# Patient Record
Sex: Female | Born: 1960 | Race: White | Hispanic: No | Smoking: Current every day smoker
Health system: Southern US, Community
[De-identification: ages and names within clinical notes are randomized; demographics above are authoritative.]

## PROBLEM LIST (undated history)

## (undated) DIAGNOSIS — I773 Arterial fibromuscular dysplasia: Secondary | ICD-10-CM

## (undated) DIAGNOSIS — J439 Emphysema, unspecified: Secondary | ICD-10-CM

## (undated) DIAGNOSIS — F32A Depression, unspecified: Secondary | ICD-10-CM

## (undated) DIAGNOSIS — K759 Inflammatory liver disease, unspecified: Secondary | ICD-10-CM

## (undated) DIAGNOSIS — F419 Anxiety disorder, unspecified: Secondary | ICD-10-CM

## (undated) DIAGNOSIS — K219 Gastro-esophageal reflux disease without esophagitis: Secondary | ICD-10-CM

## (undated) DIAGNOSIS — F5081 Binge eating disorder: Secondary | ICD-10-CM

## (undated) DIAGNOSIS — E559 Vitamin D deficiency, unspecified: Secondary | ICD-10-CM

## (undated) DIAGNOSIS — T7840XA Allergy, unspecified, initial encounter: Secondary | ICD-10-CM

## (undated) DIAGNOSIS — F50819 Binge eating disorder, unspecified: Secondary | ICD-10-CM

## (undated) DIAGNOSIS — R519 Headache, unspecified: Secondary | ICD-10-CM

## (undated) DIAGNOSIS — M5020 Other cervical disc displacement, unspecified cervical region: Secondary | ICD-10-CM

## (undated) DIAGNOSIS — H269 Unspecified cataract: Secondary | ICD-10-CM

## (undated) DIAGNOSIS — G5601 Carpal tunnel syndrome, right upper limb: Secondary | ICD-10-CM

## (undated) DIAGNOSIS — B977 Papillomavirus as the cause of diseases classified elsewhere: Secondary | ICD-10-CM

## (undated) DIAGNOSIS — J449 Chronic obstructive pulmonary disease, unspecified: Secondary | ICD-10-CM

## (undated) DIAGNOSIS — M4802 Spinal stenosis, cervical region: Secondary | ICD-10-CM

## (undated) DIAGNOSIS — G4730 Sleep apnea, unspecified: Secondary | ICD-10-CM

## (undated) DIAGNOSIS — M1990 Unspecified osteoarthritis, unspecified site: Secondary | ICD-10-CM

## (undated) DIAGNOSIS — G629 Polyneuropathy, unspecified: Secondary | ICD-10-CM

## (undated) DIAGNOSIS — R87629 Unspecified abnormal cytological findings in specimens from vagina: Secondary | ICD-10-CM

## (undated) DIAGNOSIS — F329 Major depressive disorder, single episode, unspecified: Secondary | ICD-10-CM

## (undated) DIAGNOSIS — I10 Essential (primary) hypertension: Secondary | ICD-10-CM

## (undated) HISTORY — DX: Unspecified abnormal cytological findings in specimens from vagina: R87.629

## (undated) HISTORY — PX: EYE SURGERY: SHX253

## (undated) HISTORY — DX: Binge eating disorder: F50.81

## (undated) HISTORY — DX: Sleep apnea, unspecified: G47.30

## (undated) HISTORY — DX: Essential (primary) hypertension: I10

## (undated) HISTORY — DX: Unspecified cataract: H26.9

## (undated) HISTORY — PX: BREAST BIOPSY: SHX20

## (undated) HISTORY — DX: Vitamin D deficiency, unspecified: E55.9

## (undated) HISTORY — DX: Major depressive disorder, single episode, unspecified: F32.9

## (undated) HISTORY — DX: Depression, unspecified: F32.A

## (undated) HISTORY — DX: Allergy, unspecified, initial encounter: T78.40XA

## (undated) HISTORY — DX: Polyneuropathy, unspecified: G62.9

## (undated) HISTORY — DX: Binge eating disorder, unspecified: F50.819

## (undated) HISTORY — DX: Emphysema, unspecified: J43.9

## (undated) HISTORY — PX: CHOLECYSTECTOMY: SHX55

## (undated) HISTORY — DX: Gastro-esophageal reflux disease without esophagitis: K21.9

## (undated) HISTORY — DX: Papillomavirus as the cause of diseases classified elsewhere: B97.7

---

## 1982-06-05 HISTORY — PX: TUBAL LIGATION: SHX77

## 1991-06-06 HISTORY — PX: APPENDECTOMY: SHX54

## 1999-02-28 ENCOUNTER — Inpatient Hospital Stay (HOSPITAL_COMMUNITY): Admission: AD | Admit: 1999-02-28 | Discharge: 1999-03-04 | Payer: Self-pay | Admitting: Specialist

## 2004-06-05 DIAGNOSIS — I773 Arterial fibromuscular dysplasia: Secondary | ICD-10-CM

## 2004-06-05 HISTORY — DX: Arterial fibromuscular dysplasia: I77.3

## 2004-06-05 HISTORY — PX: RENAL ARTERY ANGIOPLASTY: SHX2317

## 2004-09-15 ENCOUNTER — Ambulatory Visit: Payer: Self-pay | Admitting: Cardiology

## 2004-09-16 ENCOUNTER — Ambulatory Visit: Payer: Self-pay | Admitting: Cardiology

## 2004-09-16 ENCOUNTER — Ambulatory Visit (HOSPITAL_COMMUNITY): Admission: RE | Admit: 2004-09-16 | Discharge: 2004-09-17 | Payer: Self-pay | Admitting: Cardiology

## 2004-10-03 ENCOUNTER — Ambulatory Visit: Payer: Self-pay | Admitting: Cardiology

## 2007-10-22 ENCOUNTER — Ambulatory Visit (HOSPITAL_COMMUNITY): Admission: RE | Admit: 2007-10-22 | Discharge: 2007-10-22 | Payer: Self-pay | Admitting: Family Medicine

## 2008-10-23 ENCOUNTER — Ambulatory Visit (HOSPITAL_COMMUNITY): Admission: RE | Admit: 2008-10-23 | Discharge: 2008-10-23 | Payer: Self-pay | Admitting: Family Medicine

## 2010-06-23 ENCOUNTER — Encounter: Payer: Self-pay | Admitting: Gastroenterology

## 2010-07-07 NOTE — Letter (Signed)
Summary: Pre Visit Letter Revised  [facility]  [address] [place], [place] [address]   Phone: [phone]  Fax: [phone]        06/23/2010 MRN: [id number]  Jamie Mcgee [address] [place], [place]  [address]             Procedure Date:  07-29-10 11am            Dr Arlyce Dice  - Direct Colon   Welcome to the Gastroenterology Division at [facility].    You are scheduled to see a nurse for your pre-procedure visit on 07-15-10 at 3:30pm on the 3rd floor at [facility], [address]  We ask that you try to arrive at our office 15 minutes prior to your appointment time to allow for check-in.  Please take a minute to review the attached form.  If you answer "Yes" to one or more of the questions on the first page, we ask that you call the person listed at your earliest opportunity.  If you answer "No" to all of the questions, please complete the rest of the form and bring it to your appointment.    Your nurse visit will consist of discussing your medical and surgical history, your immediate family medical history, and your medications.   If you are unable to list all of your medications on the form, please bring the medication bottles to your appointment and we will list them.  We will need to be aware of both prescribed and over the counter drugs.  We will need to know exact dosage information as well.    Please be prepared to read and sign documents such as consent forms, a financial agreement, and acknowledgement forms.  If necessary, and with your consent, a friend or relative is welcome to sit-in on the nurse visit with you.  Please bring your insurance card so that we may make a copy of it.  If your insurance requires a referral to see a specialist, please bring your referral form from your primary care physician.  No co-pay is required for this nurse visit.     If you cannot keep your appointment, please call [phone] to cancel or reschedule prior to  your appointment date.  This allows [place] the opportunity to schedule an appointment for another patient in need of care.    Thank you for choosing [facility] for your medical needs.  We appreciate the opportunity to care for you.  Please visit [place] at our website  to learn more about our practice.  Sincerely, The Gastroenterology Division

## 2010-07-14 ENCOUNTER — Encounter (INDEPENDENT_AMBULATORY_CARE_PROVIDER_SITE_OTHER): Payer: Self-pay | Admitting: *Deleted

## 2010-07-15 ENCOUNTER — Encounter: Payer: Self-pay | Admitting: Gastroenterology

## 2010-07-15 DIAGNOSIS — R9431 Abnormal electrocardiogram [ECG] [EKG]: Secondary | ICD-10-CM

## 2010-07-21 NOTE — Letter (Signed)
Summary: [facility] Instructions  [facility]  [address] [place], [place] [address]   Phone: [phone]  Fax: [phone]       AMBERA FEDELE    02/22/1961    MRN: [id number]        Procedure Day /Date: Friday 07/29/10     Arrival Time: 10:00 am      Procedure Time: 11:00 am     Location of Procedure:                    _X_  [facility] (4th Floor)  PREPARATION FOR COLONOSCOPY WITH MOVIPREP   Starting 5 days prior to your procedure 07/24/10 do not eat nuts, seeds, popcorn, corn, beans, peas,  salads, or any raw vegetables.  Do not take any fiber supplements (e.g. Metamucil, Citrucel, and Benefiber).  THE DAY BEFORE YOUR PROCEDURE         DATE: 07/28/10   DAY: Thursday    1.  Drink clear liquids the entire day-NO SOLID FOOD  2.  Do not drink anything colored red or purple.  Avoid juices with pulp.  No orange juice.  3.  Drink at least 64 oz. (8 glasses) of fluid/clear liquids during the day to prevent dehydration and help the prep work efficiently.  CLEAR LIQUIDS INCLUDE: Water Jello Ice Popsicles Tea (sugar ok, no milk/cream) Powdered fruit flavored drinks Coffee (sugar ok, no milk/cream) Gatorade Juice: apple, white grape, white cranberry  Lemonade Clear bullion, consomm, broth Carbonated beverages (any kind) Strained chicken noodle soup Hard Candy                             4.  In the morning, mix first dose of MoviPrep solution:    Empty 1 Pouch A and 1 Pouch B into the disposable container    Add lukewarm drinking water to the top line of the container. Mix to dissolve    Refrigerate (mixed solution should be used within 24 hrs)  5.  Begin drinking the prep at 5:00 p.m. The MoviPrep container is divided by 4 marks.   Every 15 minutes drink the solution down to the next mark (approximately 8 oz) until the full liter is complete.   6.  Follow completed prep with 16 oz of clear liquid of your choice (Nothing red or purple).   Continue to drink clear liquids until bedtime.  7.  Before going to bed, mix second dose of MoviPrep solution:    Empty 1 Pouch A and 1 Pouch B into the disposable container    Add lukewarm drinking water to the top line of the container. Mix to dissolve    Refrigerate  THE DAY OF YOUR PROCEDURE      DATE: 07/29/10  DAY: Friday   Beginning at 6:00 a.m. (5 hours before procedure):         1. Every 15 minutes, drink the solution down to the next mark (approx 8 oz) until the full liter is complete.  2. Follow completed prep with 16 oz. of clear liquid of your choice.    3. You may drink clear liquids until 9:00 am   (2 HOURS BEFORE PROCEDURE).   MEDICATION INSTRUCTIONS  Unless otherwise instructed, you should take regular prescription medications with a small sip of water   as early as possible the morning of your procedure.         OTHER INSTRUCTIONS  You will  need a responsible adult at least 51 years of age to accompany you and drive you home.   This person must remain in the waiting room during your procedure.  Wear loose fitting clothing that is easily removed.  Leave jewelry and other valuables at home.  However, you may wish to bring a book to read or  an iPod/MP3 player to listen to music as you wait for your procedure to start.  Remove all body piercing jewelry and leave at home.  Total time from sign-in until discharge is approximately 2-3 hours.  You should go home directly after your procedure and rest.  You can resume normal activities the  day after your procedure.  The day of your procedure you should not:   Drive   Make legal decisions   Operate machinery   Drink alcohol   Return to work  You will receive specific instructions about eating, activities and medications before you leave.    The above instructions have been reviewed and explained to me by   Sherren Kerns RN  July 15, 2010 3:50 PM    I fully understand and can verbalize  these instructions _____________________________ Date _________

## 2010-07-21 NOTE — Miscellaneous (Signed)
Summary: previsit prep/rm  Clinical Lists Changes  Medications: Added new medication of MOVIPREP 100 GM  SOLR (PEG-KCL-NACL-NASULF-NA ASC-C) As per prep instructions. - Signed Rx of MOVIPREP 100 GM  SOLR (PEG-KCL-NACL-NASULF-NA ASC-C) As per prep instructions.;  #1 x 0;  Signed;  Entered by: Sherren Kerns RN;  Authorized by: Louis Meckel MD;  Method used: Electronically to [facility] [address] [place], [place], [place]  [address], Ph: [phone], Fax: [phone] Observations: Added new observation of ALLERGY REV: Done (07/15/2010 14:59) Added new observation of NKA: T (07/15/2010 14:59)    Prescriptions: MOVIPREP 100 GM  SOLR (PEG-KCL-NACL-NASULF-NA ASC-C) As per prep instructions.  #1 x 0   Entered by:   Sherren Kerns RN   Authorized by:   Louis Meckel MD   Signed by:   Sherren Kerns RN on 07/15/2010   Method used:   Electronically to        [facility] (retail)       [address]       [place], [place]  [address]       Ph: [phone]       Fax: [phone]   RxID:   [id number]

## 2010-07-22 ENCOUNTER — Other Ambulatory Visit: Payer: Self-pay | Admitting: Ophthalmology

## 2010-07-22 ENCOUNTER — Encounter (HOSPITAL_COMMUNITY): Payer: BC Managed Care – PPO

## 2010-07-22 DIAGNOSIS — Z01812 Encounter for preprocedural laboratory examination: Secondary | ICD-10-CM | POA: Insufficient documentation

## 2010-07-22 LAB — BASIC METABOLIC PANEL
CO2: 25 mEq/L (ref 19–32)
Calcium: 9.3 mg/dL (ref 8.4–10.5)

## 2010-07-22 LAB — HEMOGLOBIN AND HEMATOCRIT, BLOOD
HCT: 43.2 % (ref 36.0–46.0)
Hemoglobin: 14.8 g/dL (ref 12.0–15.0)

## 2010-07-29 ENCOUNTER — Other Ambulatory Visit (AMBULATORY_SURGERY_CENTER): Payer: BC Managed Care – PPO | Admitting: Gastroenterology

## 2010-07-29 ENCOUNTER — Other Ambulatory Visit: Payer: Self-pay | Admitting: Gastroenterology

## 2010-07-29 DIAGNOSIS — D126 Benign neoplasm of colon, unspecified: Secondary | ICD-10-CM

## 2010-07-29 DIAGNOSIS — K6389 Other specified diseases of intestine: Secondary | ICD-10-CM

## 2010-07-29 DIAGNOSIS — Z1211 Encounter for screening for malignant neoplasm of colon: Secondary | ICD-10-CM

## 2010-08-02 ENCOUNTER — Encounter: Payer: Self-pay | Admitting: Gastroenterology

## 2010-08-02 NOTE — Procedures (Addendum)
Summary: Colonoscopy  Patient: Jamie Mcgee Note: All result statuses are Final unless otherwise noted.  Tests: (1) Colonoscopy (COL)   COL Colonoscopy           DONE     [facility]     [address]     [place], [place]  [address]           COLONOSCOPY PROCEDURE REPORT           PATIENT:  Jamie Mcgee, Jamie Mcgee  MR#:  [id number]     BIRTHDATE:  01/25/1961, 49 yrs. old  GENDER:  female           ENDOSCOPIST:  Barbette Hair. Arlyce Dice, MD     Referred by:  Rudi Heap, M.D.           PROCEDURE DATE:  07/29/2010     PROCEDURE:  Colon with cold biopsy polypectomy     ASA CLASS:  Class II     INDICATIONS:  1) Routine Risk Screening           MEDICATIONS:   Fentanyl 75 mcg IV, Versed 7 mg IM, Benadryl 25 mg     IV           DESCRIPTION OF PROCEDURE:   After the risks benefits and     alternatives of the procedure were thoroughly explained, informed     consent was obtained.  Digital rectal exam was performed and     revealed no abnormalities.   The LB 180AL [id number] endoscope was     introduced through the anus and advanced to the cecum, which was     identified by both the appendix and ileocecal valve, without     limitations.  The quality of the prep was excellent, using     MoviPrep.  The instrument was then slowly withdrawn as the colon     was fully examined.     <<PROCEDUREIMAGES>>           FINDINGS:  A diminutive polyp was found in the rectum. It was 2 mm     in size. The polyp was removed using cold biopsy forceps (see     image12).  Melanosis coli was found.  This was otherwise a normal     examination of the colon (see image1, image2, image3, image5,     image8, image10, and image11).   Retroflexed views in the rectum     revealed no abnormalities.    The time to cecum =  0  minutes. The     scope was then withdrawn (time =  6.75  min) from the patient and     the procedure completed.           COMPLICATIONS:  None           ENDOSCOPIC IMPRESSION:     1) 2 mm  diminutive polyp in the rectum     2) Melanosis     3) Otherwise normal examination     RECOMMENDATIONS:     1) If the polyp(s) removed today are proven to be adenomatous     (pre-cancerous) polyps, you will need a repeat colonoscopy in 5     years. Otherwise you should continue to follow colorectal cancer     screening guidelines for "routine risk" patients with colonoscopy     in 10 years.           REPEAT EXAM:   You will receive a letter from Dr. Arlyce Dice  in 1-2     weeks, after reviewing the final pathology, with followup     recommendations.           ______________________________     Barbette Hair Arlyce Dice, MD           CC:           n.     eSIGNED:   Barbette Hair. Kaplan at 07/29/2010 12:00 PM           Jamie Mcgee, Jamie Mcgee, [id number]  Note: An exclamation mark (!) indicates a result that was not dispersed into the flowsheet. Document Creation Date: 07/29/2010 12:03 PM _______________________________________________________________________  (1) Order result status: Final Collection or observation date-time: 07/29/2010 11:55 Requested date-time:  Receipt date-time:  Reported date-time:  Referring Physician:   Ordering Physician: Melvia Heaps [phone]) Specimen Source:  Source: Launa Grill Order Number: [id number] Lab site:   Appended Document: Colonoscopy     Procedures Next Due Date:    Colonoscopy: 07/2020

## 2010-08-04 ENCOUNTER — Ambulatory Visit (HOSPITAL_COMMUNITY)
Admission: RE | Admit: 2010-08-04 | Discharge: 2010-08-04 | Disposition: A | Discharge status: Home / Self Care | Payer: BC Managed Care – PPO | Source: Ambulatory Visit | Attending: Ophthalmology | Admitting: Ophthalmology

## 2010-08-04 DIAGNOSIS — Z01812 Encounter for preprocedural laboratory examination: Secondary | ICD-10-CM | POA: Insufficient documentation

## 2010-08-04 DIAGNOSIS — Z79899 Other long term (current) drug therapy: Secondary | ICD-10-CM | POA: Insufficient documentation

## 2010-08-04 DIAGNOSIS — I10 Essential (primary) hypertension: Secondary | ICD-10-CM | POA: Insufficient documentation

## 2010-08-04 DIAGNOSIS — H2510 Age-related nuclear cataract, unspecified eye: Secondary | ICD-10-CM | POA: Insufficient documentation

## 2010-08-04 DIAGNOSIS — Z01818 Encounter for other preprocedural examination: Secondary | ICD-10-CM | POA: Insufficient documentation

## 2010-08-04 HISTORY — PX: CATARACT EXTRACTION: SUR2

## 2010-08-11 NOTE — Letter (Signed)
Summary: Patient Notice-Hyperplastic Polyps  [facility]  [address] [place], [place] [address]   Phone: [phone]  Fax: [phone]        August 02, 2010 MRN: [id number]    [facility] [address] [place], [place]  [address]    Dear Ms. GOEL,  I am pleased to inform you that the colon polyp(s) removed during your recent colonoscopy was (were) found to be hyperplastic.  These types of polyps are NOT pre-cancerous.  It is therefore my recommendation that you have a repeat colonoscopy examination in 10_ years for routine colorectal cancer screening.  Should you develop new or worsening symptoms of abdominal pain, bowel habit changes or bleeding from the rectum or bowels, please schedule an evaluation with either your primary care physician or with me.  Additional information/recommendations:  __No further action with gastroenterology is needed at this time.      Please follow-up with your primary care physician for your other      healthcare needs. __Please call [phone] to schedule a return visit to review      your situation.  __Please keep your follow-up visit as already scheduled.  _x_Continue treatment plan as outlined the day of your exam.  Please call us if you are having persistent problems or have questions about your condition that have not been fully answered at this time.  Sincerely,  Louis Meckel MD This letter has been electronically signed by your physician.  Appended Document: Patient Notice-Hyperplastic Polyps letter mailed

## 2010-08-18 ENCOUNTER — Encounter: Payer: Self-pay | Admitting: Cardiology

## 2010-09-01 ENCOUNTER — Institutional Professional Consult (permissible substitution): Payer: BC Managed Care – PPO | Admitting: Cardiology

## 2010-10-21 NOTE — Discharge Summary (Signed)
Jamie Mcgee, Jamie Mcgee               ACCOUNT NO.:  [id number]   MEDICAL RECORD NO.:  [id number]          PATIENT TYPE:  OIB   LOCATION:  5733                         FACILITY:  [facility]   PHYSICIAN:  Salvadore Farber, M.D. 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  The region of fibromuscular  dysplasia was treated with balloon angioplasty with good angiographic  result.   PAST MEDICAL HISTORY:  1.  Hypertension with aggressive titration of multiple medications still      uncontrolled prior to this procedure.  2.  Tobacco abuse.  3.  Marijuana use.   FAMILY HISTORY:  CAD.   DISPOSITION:  The patient being discharged home with instructions to  continue a low fat/low salt/low cholesterol diet.  She may shower, no tub  bathing x1 week, no lifting over 10 pounds x1 week, no driving for two days.  Gently clean cath site with soap and water.  She will continue Lotrel 5/20  mg p.o. b.i.d., nicotine patch as previously, aspirin 325 mg daily.  She is  instructed to discontinue her clonidine.  I have called the office and left  messages for the patient to be contacted to have a BMET drawn this Wednesday  or Thursday.  She also will need a followup appointment with the PA on Dr.  Melinda Crutch office day and around 10 days.  She is instructed to call our  office for any fevers, any pain, swelling from cath site.      MB/MEDQ  D:  09/17/2004  T:  09/17/2004  Job:  [id number]

## 2010-10-21 NOTE — Discharge Summary (Signed)
[facility]  Patient:    Jamie Mcgee                      MRN: [id number] Adm. Date:  [id number] Disc. Date: 03/04/99 Attending:  Milagros Evener Mcgee                           Discharge Summary  HISTORY OF PRESENT ILLNESS:  Jamie Mcgee is a 50 year old married white female who as admitted for treatment of severe depression of several years.  Her depression became worse and she planned on overdosing on her medications.  She admitted to  having felt depressed for several years.  Her depression became worse more recently.  She admitted to feeling hopeless, helpless, and worthless.  Also reported decreased energy, decreased motivation, increased sleep, anhedonia, and multiple problems with her husband.  The patient does not have any sex drive and has not had one for almost a year. Her husband thinks she does not love him anymore.  He does not seem to understand her depression.  The patient did see her primary care physician about nine months ago and was prescribed Paxil which made matters worse.  Since the patient was not able to contract for safety, hence this admission.  ADMITTING DIAGNOSES:   AXIS I:  Major depression disorder, single episode, severe, without psychotic            features.  AXIS II:  Deferred. AXIS III:  Healthy.  AXIS IV:  Chronic depression and marital problems.   AXIS V:  Code 30/65.  HOSPITAL COURSE:  The patient was admitted to unit 5000.  She was put on close watch and suicide precautions.  Urinalysis, drug screen, CMET, and CBC were ordered and she was encouraged to take part in individual and group psychotherapy.  During her stay in the hospital, her Effexor was resumed.  Family session was ordered.  Effexor was tapered off since the patient did not improve and has been on it for some time before she came here.  Also her blood pressure had increased. I added Wellbutrin SR 150 mg p.o. q.a.m. and to be increased  to p.o. q.a.m. at noon. Checks were discontinued on March 04, 1999 since the patient felt better. he did not have thoughts of harming herself.  Trazodone was prescribed for insomnia.  Her WBC count and basic metabolic panel was within normal limits.  Her UA revealed normal wbc count, rbcs were 6 to 10.  Drug screen was positive for marijuana and benzodiazepines.  Thyroid functions were within normal limits.  Her vital signs  were monitored closely and were within normal limits.  Since the patient had improved, she was able to contract for safety.  She felt uch better.  She was discharged home and was given outpatient appointment.  DISCHARGE MEDICATIONS: 1. Wellbutrin SR 150 mg p.o. q.a.m. for 5 days and then p.o. q.a.m. at noon. 2. Trazodone 50 mg 1-2 tablets at bed time.  DISCHARGE INSTRUCTIONS:  She was given outpatient appointment for follow-up in October.  PROGNOSIS:  Good.  DISCHARGE DIAGNOSES:   AXIS I:  Major depressive disorder, single episode, severe without psychotic            features.  AXIS II:  Deferred. AXIS III:  Healthy.  AXIS IV:  Recurrent chronic depression and marital problems due to depression.   AXIS V:  Code 60 at the time of discharge.DD:  05/01/99 TD:  05/01/99 Job: [id number]

## 2010-10-21 NOTE — Op Note (Signed)
NAMEALETHA, ALLEBACH               ACCOUNT NO.:  [id number]   MEDICAL RECORD NO.:  [id number]          PATIENT TYPE:  OIB   LOCATION:  2855                         FACILITY:  [facility]   PHYSICIAN:  Salvadore Farber, M.D. LHCDATE OF BIRTH:  1961-01-28   DATE OF PROCEDURE:  09/16/2004  DATE OF DISCHARGE:                                 OPERATIVE REPORT   PROCEDURE:  Abdominal aortography, selective bilateral renal angiography,  balloon angioplasty of the right renal artery.   INDICATIONS FOR PROCEDURE:  [name] is a 50 year old lady who was  diagnosed with hypertension in February of this year.  At that time, blood  pressure was 240/130.  Despite aggressive up titration of multiple  medications, blood pressure has not been able to be controlled.  Evaluation  for secondary causes of hypertension has revealed only suggestion of right  renal artery stenosis by renal duplex performed at [facility].  She  is referred for angiography and possible percutaneous revascularization.   PROCEDURE TECHNIQUE:  Informed consent was obtained.  Under 1% lidocaine  local anesthesia, a 5 French sheath was placed in the right common femoral  artery using the modified Seldinger technique.  The pigtail catheter was  advanced to the suprarenal abdominal aorta.  Abdominal aortography was  performed by power injection.  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  The sheath was up sized to a 6  [place].  Anticoagulation was initiated with heparin to achieve an ACT of  greater than 250 seconds.  A 6 French LIMA guide was then used to  selectively engage the left renal artery.  This was angiographically normal.  The guide was then selectively engaged in the right renal artery.  Angiography was again performed by hand injection.  This confirmed  fibromuscular dysplasia in the mid portion of this vessel.  A stabilizer  wire was advanced beyond the region of  dysplasia without difficulty.  The  vessel was initially dilated using a 3.5 by 20 mm Maverick at 6 atmospheres.  We then further dilated using a 4 by 15 mm Aviator at 10 atmospheres.  This  balloon also appeared under sized.  The vessel was then further dilated  using a 5 by 15 mm Aviator at 8 atmospheres distally and a second inflation  at 6 atmospheres proximally. With these inflations, she did have mild  discomfort that resolved with balloon deflation.  Repeat angiography  demonstrated marked improvement in the angiographic appearance of the vessel  and normal flow to the distal vasculature.  No evidence of dissection.  The  patient tolerated the procedure well and was transferred to the holding room  in stable condition.  The sheaths will be removed there.   COMPLICATIONS:  None.   FINDINGS:  1.  Abdominal aorta:  No evidence of atherosclerosis in the abdominal aorta      or proximal portion of the common iliacs bilaterally.  2.  Right renal artery:  Single vessel, fibromuscular dysplasia of the      proximal and mid vessel treated with balloon angioplasty.  3.  Left renal artery is angiographically normal.  4.  Both kidneys are of normal size and have normal contrast uptake.   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  The single left renal artery was angiographically  normal.  The region of fibromuscular dysplasia was treated with balloon  angioplasty with good angiographic result.  Will plan on holding her  antihypertensive medications for at least the next 24 hours to assess  response.  Check renal function in the morning.      WED/MEDQ  D:  09/16/2004  T:  09/16/2004  Job:  [id number]   cc:   Cecille Aver, M.D.  [address]  [place]  [place] [address]  Fax: [phone]   Alfredia Client, M.D.

## 2011-05-22 ENCOUNTER — Encounter (HOSPITAL_COMMUNITY): Payer: Self-pay | Admitting: Pharmacy Technician

## 2011-05-25 ENCOUNTER — Encounter (HOSPITAL_COMMUNITY): Payer: Self-pay

## 2011-05-25 ENCOUNTER — Encounter (HOSPITAL_COMMUNITY)
Admission: RE | Admit: 2011-05-25 | Discharge: 2011-05-25 | Disposition: A | Discharge status: Home / Self Care | Payer: BC Managed Care – PPO | Source: Ambulatory Visit | Attending: Ophthalmology | Admitting: Ophthalmology

## 2011-05-25 HISTORY — DX: Arterial fibromuscular dysplasia: I77.3

## 2011-05-25 LAB — BASIC METABOLIC PANEL
CO2: 28 mEq/L (ref 19–32)
Calcium: 9.3 mg/dL (ref 8.4–10.5)
Chloride: 105 mEq/L (ref 96–112)
Creatinine, Ser: 0.83 mg/dL (ref 0.50–1.10)
GFR calc Af Amer: >90 mL/min (ref >90)
GFR calc non Af Amer: 80 mL/min — ABNORMAL LOW (ref >90)
Sodium: 139 mEq/L (ref 135–145)

## 2011-05-25 LAB — HEMOGLOBIN AND HEMATOCRIT, BLOOD
HCT: 42.8 % (ref 36.0–46.0)
Hemoglobin: 14.8 g/dL (ref 12.0–15.0)

## 2011-05-25 NOTE — Patient Instructions (Addendum)
Cataract A cataract is a clouding of the lens of the eye. It is most often related to aging. A cataract is not a "film" over the surface of the eye. The lens is inside the eye and changes size of the pupil. The lens can enlarge to let more light enter the eye in dark environments and contract the size of the pupil to let in bright light. The lens is the part of the eye that helps focus light on the retina. The retina is the eye's light-sensitive layer. It is in the back of the eye that sends visual signals to the brain. In a normal eye, light passes through the lens and gets focused on the retina. To help produce a sharp image, the lens must remain clear. When a lens becomes cloudy, vision is compromised by the degree and nature of the clouding. Certain cataracts make people more near-sighted as they develop, others increase glare, and all reduce vision to some degree or another. A cataract that is so dense that it becomes milky white and a white opacity can be seen through the pupil. When the white color is seen, it is called a "mature" or "hyper-mature cataract." Such cataracts cause total blindness in the affected eye. The cataract must be removed to prevent damage to the eye itself. Some types of cataracts can cause a secondary disease of the eye, such as certain types of glaucoma. In the early stages, better lighting and eyeglasses may lessen vision problems caused by cataracts. At a certain point, surgery may be needed to improve vision. CAUSES   Aging. However, cataracts may occur at any age, even in newborns.   Certain drugs.   Trauma to the eye.   Certain diseases (such as diabetes).   Inherited or acquired medical syndromes.  SYMPTOMS   Gradual, progressive drop in vision in the affected eye. Cataracts may develop at different rates in each eye. Cataracts may even be in just one eye with the other unaffected.   Cataracts due to trauma may develop quickly, sometimes over a matter or days  or even hours. The result is severe and rapid visual loss.  DIAGNOSIS  To detect a cataract, an eye doctor examines the lens. A well developed cataract can be diagnosed without dilating the pupil. Early cataracts and others of a specific nature are best diagnosed with an exam of the eyes with the pupils dilated by drops. TREATMENT   For an early cataract, vision may improve by using different eyeglasses or stronger lighting.   If the above measures do not help, surgery is the only effective treatment. This treatment removes the cloudy lens and replaces it with a substitute lens (Intraocular lens, or IOL). Newly developed IOL technology allows the implanted lens to improve vision both at a distance and up close. Discuss with your eye surgeon about the possibility of still needing glasses. Also discuss how visual coordination between both eyes will be affected.  A cataract needs to be removed only when vision loss interferes with your everyday activities such as driving, reading or watching TV. You and your eye doctor can make that decision together. In most cases, waiting until you are ready to have cataract surgery will not harm your eye. If you have cataracts in both eyes, only one should be removed at a time. This allows the operated eye to heal and be out of danger from serious problems (such as infection or poor wound healing) before having the other eye undergo surgery.  Sometimes, a cataract should be removed even if it does not cause problems with your vision. For example, a cataract should be removed if it prevents examination or treatment of another eye problem. Just as you cannot see out of the affected eye well, your doctor cannot see into your eye well through a cataract. The vast majority of people who have cataract surgery have better vision afterward. CATARACT REMOVAL There are two primary ways to remove a cataract. Your doctor can explain the differences and help determine which is best  for you:  Phacoemulsification (small incision cataract surgery). This involves making a small cut (incision) on the edge of the clear, dome-shaped surface that covers the front of the eye (the cornea). An injection behind the eye or eye drops are given to make this a painless procedure. The doctor then inserts a tiny probe into the eye. This device emits ultrasound waves that soften and break up the cloudy center of the lens so it can be removed by suction. Most cataract surgery is done this way. The cuts are usually so small and performed in such a manner that often no sutures are needed to keep it closed.   Extracapsular surgery. Your doctor makes a slightly longer incision on the side of the cornea. The doctor removes the hard center of the lens. The remainder of the lens is then removed by suction. In some cases, extremely fine sutures are needed which the doctor may, or may not remove in the office after the surgery.  When an IOL is implanted, it needs no care. It becomes a permanent part of your eye and cannot be seen or felt.  Some people cannot have an IOL. They may have problems during surgery, or maybe they have another eye disease. For these people, a soft contact lens may be suggested. If an IOL or contact lens cannot be used, very powerful and thick glasses are required after surgery. Since vision is very different through such thick glasses, it is important to have your doctor discuss the impact on20 KATHLINE BANBURY  05/25/2011   Your procedure is scheduled on:   06/01/2011  Report to [facility] at  700  AM.  Call this number if you have problems the morning of surgery: [phone]   Remember:   Do not eat food:After Midnight.  May have clear liquids:until Midnight .  Clear liquids include soda, tea, black coffee, apple or grape juice, broth.  Take these medicines the morning of surgery with A SIP OF WATER: lotensin,coreg,catapress   Do not wear jewelry, make-up or nail polish.  Do  not wear lotions, powders, or perfumes. You may wear deodorant.  Do not shave 48 hours prior to surgery.  Do not bring valuables to the hospital.  Contacts, dentures or bridgework may not be worn into surgery.  Leave suitcase in the car. After surgery it may be brought to your room.  For patients admitted to the hospital, checkout time is 11:00 AM the day of discharge.   Patients discharged the day of surgery will not be allowed to drive home.  Name and phone number of your driver: family  Special Instructions: N/A   Please read over the following fact sheets that you were given: Pain Booklet, Surgical Site Infection Prevention, Anesthesia Post-op Instructions and Care and Recovery After Surgery  your vision after any cataract surgery where there is no plan to implant an IOL. The normal lens of the eye is covered by a clear capsule.  Both phacoemulsification and extracapsular surgery require that the back surface of this lens capsule be left in place. This helps support IOLs and prevents the IOL from dislocating and falling back into the deeper interior of the eye. Right after surgery, and often permanently this "posterior capsule" remains clear. In some cases however, it can become cloudy, presenting the same type of visual compromise that the original cataract did since light is again obstructed as it passes through the clear IOL. This condition is often referred to as an "after-cataract." Fortunately, after-cataracts are easily treated using a painless and very fast laser treatment that is performed without anesthesia or incisions. It is done in a matter of minutes in an outpatient environment. Visual improvement is often immediate.  HOME CARE INSTRUCTIONS   Your surgeon will discuss pre and post operative care with you prior to surgery. The majority of people are able to do almost all normal activities right away. Although, it is often advised to avoid strenuous activity for a period of time.    Postoperative drops and careful avoidance of infection will be needed. Many surgeons suggest the use of a protective shield during the first few days after surgery.   There is a very small incidence of complication from modern cataract surgery, but it can happen. Infection that spreads to the inside of the eye (endophthalmitis) can result in total visual loss and even loss of the eye itself. In extremely rare instances, the inflammation of endophthalmitis can spread to both eyes (sympathetic ophthalmia). Appropriate post-operative care under the close observation of your surgeon is essential to a successful outcome.  SEEK IMMEDIATE MEDICAL CARE IF:   You have any sudden drop of vision in the operated eye.   You have pain in the operated eye.   You see a large number of floating dots in the field of vision in the operated eye.   You see flashing lights, or if a portion of your side vision in any direction appears black (like a curtain being drawn into your field of vision) in the operated eye.  Document Released: 05/22/2005 Document Revised: 02/01/2011 Document Reviewed: 07/08/2007 [facility] Patient Information 2012 [place], [place].PATIENT INSTRUCTIONS POST-ANESTHESIA  IMMEDIATELY FOLLOWING SURGERY:  Do not drive or operate machinery for the first twenty four hours after surgery.  Do not make any important decisions for twenty four hours after surgery or while taking narcotic pain medications or sedatives.  If you develop intractable nausea and vomiting or a severe headache please notify your doctor immediately.  FOLLOW-UP:  Please make an appointment with your surgeon as instructed. You do not need to follow up with anesthesia unless specifically instructed to do so.  WOUND CARE INSTRUCTIONS (if applicable):  Keep a dry clean dressing on the anesthesia/puncture wound site if there is drainage.  Once the wound has quit draining you may leave it open to air.  Generally you should leave the  bandage intact for twenty four hours unless there is drainage.  If the epidural site drains for more than 36-48 hours please call the anesthesia department.  QUESTIONS?:  Please feel free to call your physician or the hospital operator if you have any questions, and they will be happy to assist you.     [facility] Anesthesia Department [address] [place] [phone]

## 2011-06-01 ENCOUNTER — Ambulatory Visit (HOSPITAL_COMMUNITY)
Admission: RE | Admit: 2011-06-01 | Discharge: 2011-06-01 | Disposition: A | Discharge status: Home / Self Care | Payer: BC Managed Care – PPO | Source: Ambulatory Visit | Attending: Ophthalmology | Admitting: Ophthalmology

## 2011-06-01 ENCOUNTER — Encounter (HOSPITAL_COMMUNITY): Payer: Self-pay | Admitting: Ophthalmology

## 2011-06-01 ENCOUNTER — Ambulatory Visit (HOSPITAL_COMMUNITY): Payer: BC Managed Care – PPO | Admitting: Anesthesiology

## 2011-06-01 ENCOUNTER — Encounter (HOSPITAL_COMMUNITY)
Admission: RE | Disposition: A | Discharge status: Home / Self Care | Payer: Self-pay | Source: Ambulatory Visit | Attending: Ophthalmology

## 2011-06-01 ENCOUNTER — Encounter (HOSPITAL_COMMUNITY): Payer: Self-pay | Admitting: Anesthesiology

## 2011-06-01 DIAGNOSIS — Z01812 Encounter for preprocedural laboratory examination: Secondary | ICD-10-CM | POA: Insufficient documentation

## 2011-06-01 DIAGNOSIS — H2589 Other age-related cataract: Secondary | ICD-10-CM | POA: Insufficient documentation

## 2011-06-01 DIAGNOSIS — Z79899 Other long term (current) drug therapy: Secondary | ICD-10-CM | POA: Insufficient documentation

## 2011-06-01 DIAGNOSIS — I10 Essential (primary) hypertension: Secondary | ICD-10-CM | POA: Insufficient documentation

## 2011-06-01 HISTORY — PX: CATARACT EXTRACTION W/PHACO: SHX586

## 2011-06-01 SURGERY — PHACOEMULSIFICATION, CATARACT, WITH IOL INSERTION
Anesthesia: Monitor Anesthesia Care | Site: Eye | Laterality: Right | Wound class: Clean

## 2011-06-01 MED ORDER — CYCLOPENTOLATE-PHENYLEPHRINE 0.2-1 % OP SOLN
OPHTHALMIC | Status: AC
  Administered 2011-06-01: 1 [drp] via OPHTHALMIC
  Filled 2011-06-01: qty 2

## 2011-06-01 MED ORDER — LIDOCAINE HCL (PF) 1 % IJ SOLN
INTRAMUSCULAR | Status: DC | PRN
  Administered 2011-06-01: .4 mL

## 2011-06-01 MED ORDER — POVIDONE-IODINE 5 % OP SOLN
OPHTHALMIC | Status: DC | PRN
  Administered 2011-06-01: 1 via OPHTHALMIC

## 2011-06-01 MED ORDER — BSS IO SOLN
INTRAOCULAR | Status: DC | PRN
Start: 2011-06-01 — End: 2011-06-01
  Administered 2011-06-01: 15 mL via INTRAOCULAR

## 2011-06-01 MED ORDER — EPINEPHRINE HCL 1 MG/ML IJ SOLN
INTRAOCULAR | Status: DC | PRN
  Administered 2011-06-01: 08:00:00

## 2011-06-01 MED ORDER — MIDAZOLAM HCL 2 MG/2ML IJ SOLN
1.0000 mg | INTRAMUSCULAR | Status: DC | PRN
  Administered 2011-06-01: 2 mg via INTRAVENOUS

## 2011-06-01 MED ORDER — PHENYLEPHRINE HCL 2.5 % OP SOLN
OPHTHALMIC | Status: AC
  Administered 2011-06-01: 1 [drp] via OPHTHALMIC
  Filled 2011-06-01: qty 2

## 2011-06-01 MED ORDER — PHENYLEPHRINE HCL 2.5 % OP SOLN
1.0000 [drp] | OPHTHALMIC | Status: AC
  Administered 2011-06-01 (×3): 1 [drp] via OPHTHALMIC

## 2011-06-01 MED ORDER — TETRACAINE HCL 0.5 % OP SOLN
1.0000 [drp] | OPHTHALMIC | Status: AC
  Administered 2011-06-01 (×3): 1 [drp] via OPHTHALMIC

## 2011-06-01 MED ORDER — MIDAZOLAM HCL 2 MG/2ML IJ SOLN
INTRAMUSCULAR | Status: AC
  Filled 2011-06-01: qty 2

## 2011-06-01 MED ORDER — ONDANSETRON HCL 4 MG/2ML IJ SOLN: 4.0000 mg | Freq: Once | INTRAMUSCULAR | Status: DC | PRN

## 2011-06-01 MED ORDER — PROVISC 10 MG/ML IO SOLN
INTRAOCULAR | Status: DC | PRN
Start: 2011-06-01 — End: 2011-06-01
  Administered 2011-06-01: 8.5 mg via INTRAOCULAR

## 2011-06-01 MED ORDER — FENTANYL CITRATE 0.05 MG/ML IJ SOLN: 25.0000 ug | INTRAMUSCULAR | Status: DC | PRN

## 2011-06-01 MED ORDER — NEOMYCIN-POLYMYXIN-DEXAMETH 3.5-10000-0.1 OP OINT
TOPICAL_OINTMENT | OPHTHALMIC | Status: DC | PRN
  Administered 2011-06-01: 1 via OPHTHALMIC

## 2011-06-01 MED ORDER — LIDOCAINE HCL (PF) 1 % IJ SOLN
INTRAMUSCULAR | Status: AC
  Filled 2011-06-01: qty 2

## 2011-06-01 MED ORDER — NEOMYCIN-POLYMYXIN-DEXAMETH 3.5-10000-0.1 OP OINT
TOPICAL_OINTMENT | OPHTHALMIC | Status: AC
  Filled 2011-06-01: qty 3.5

## 2011-06-01 MED ORDER — EPINEPHRINE HCL 1 MG/ML IJ SOLN
INTRAMUSCULAR | Status: AC
  Filled 2011-06-01: qty 1

## 2011-06-01 MED ORDER — TETRACAINE HCL 0.5 % OP SOLN
OPHTHALMIC | Status: AC
  Administered 2011-06-01: 1 [drp] via OPHTHALMIC
  Filled 2011-06-01: qty 2

## 2011-06-01 MED ORDER — LIDOCAINE HCL 3.5 % OP GEL
OPHTHALMIC | Status: AC
  Filled 2011-06-01: qty 5

## 2011-06-01 MED ORDER — MIDAZOLAM HCL 5 MG/5ML IJ SOLN
INTRAMUSCULAR | Status: DC | PRN
  Administered 2011-06-01: 2 mg via INTRAVENOUS

## 2011-06-01 MED ORDER — LACTATED RINGERS IV SOLN
INTRAVENOUS | Status: DC
  Administered 2011-06-01: 1000 mL via INTRAVENOUS

## 2011-06-01 MED ORDER — LIDOCAINE HCL 3.5 % OP GEL: 1.0000 "application " | Freq: Once | OPHTHALMIC | Status: DC

## 2011-06-01 MED ORDER — CYCLOPENTOLATE-PHENYLEPHRINE 0.2-1 % OP SOLN
1.0000 [drp] | OPHTHALMIC | Status: AC
  Administered 2011-06-01 (×3): 1 [drp] via OPHTHALMIC

## 2011-06-01 SURGICAL SUPPLY — 30 items
CAPSULAR TENSION RING-AMO (OPHTHALMIC RELATED) IMPLANT
CLOTH BEACON ORANGE TIMEOUT ST (SAFETY) ×1 IMPLANT
EYE SHIELD UNIVERSAL CLEAR (GAUZE/BANDAGES/DRESSINGS) ×1 IMPLANT
GLOVE BIO SURGEON STRL SZ 6.5 (GLOVE) IMPLANT
GLOVE BIOGEL PI IND STRL 6.5 (GLOVE) IMPLANT
GLOVE BIOGEL PI IND STRL 7.0 (GLOVE) IMPLANT
GLOVE BIOGEL PI IND STRL 7.5 (GLOVE) IMPLANT
GLOVE BIOGEL PI INDICATOR 6.5 (GLOVE)
GLOVE BIOGEL PI INDICATOR 7.0 (GLOVE) ×1
GLOVE BIOGEL PI INDICATOR 7.5 (GLOVE)
GLOVE ECLIPSE 6.5 STRL STRAW (GLOVE) IMPLANT
GLOVE ECLIPSE 7.0 STRL STRAW (GLOVE) IMPLANT
GLOVE ECLIPSE 7.5 STRL STRAW (GLOVE) IMPLANT
GLOVE EXAM NITRILE LRG STRL (GLOVE) IMPLANT
GLOVE EXAM NITRILE MD LF STRL (GLOVE) ×1 IMPLANT
GLOVE SKINSENSE NS SZ6.5 (GLOVE)
GLOVE SKINSENSE NS SZ7.0 (GLOVE)
GLOVE SKINSENSE STRL SZ6.5 (GLOVE) IMPLANT
GLOVE SKINSENSE STRL SZ7.0 (GLOVE) IMPLANT
KIT VITRECTOMY (OPHTHALMIC RELATED) IMPLANT
PAD ARMBOARD 7.5X6 YLW CONV (MISCELLANEOUS) ×1 IMPLANT
PROC W NO LENS (INTRAOCULAR LENS)
PROC W SPEC LENS (INTRAOCULAR LENS)
PROCESS W NO LENS (INTRAOCULAR LENS) IMPLANT
PROCESS W SPEC LENS (INTRAOCULAR LENS) IMPLANT
RING MALYGIN (MISCELLANEOUS) IMPLANT
SIGHTPATH CAT PROC W REG LENS (Ophthalmic Related) ×2 IMPLANT
SYR TB 1ML LL NO SAFETY (SYRINGE) ×1 IMPLANT
VISCOELASTIC ADDITIONAL (OPHTHALMIC RELATED) IMPLANT
WATER STERILE IRR 250ML POUR (IV SOLUTION) ×1 IMPLANT

## 2011-06-01 NOTE — Anesthesia Preprocedure Evaluation (Signed)
Anesthesia Evaluation  Patient identified by MRN, date of birth, ID band Patient awake    Reviewed: Allergy & Precautions, H&P , NPO status , Patient's Chart, lab work & pertinent test results, reviewed documented beta blocker date and time   History of Anesthesia Complications Negative for: history of anesthetic complications  Airway Mallampati: I      Dental  (+) Teeth Intact   Pulmonary neg pulmonary ROS, Current Smoker,  clear to auscultation        Cardiovascular hypertension, Pt. on medications Regular Normal    Neuro/Psych    GI/Hepatic   Endo/Other    Renal/GU      Musculoskeletal   Abdominal   Peds  Hematology   Anesthesia Other Findings   Reproductive/Obstetrics                           Anesthesia Physical Anesthesia Plan  ASA: II  Anesthesia Plan: MAC   Post-op Pain Management:    Induction: Intravenous  Airway Management Planned: Nasal Cannula  Additional Equipment:   Intra-op Plan:   Post-operative Plan:   Informed Consent: I have reviewed the patients History and Physical, chart, labs and discussed the procedure including the risks, benefits and alternatives for the proposed anesthesia with the patient or authorized representative who has indicated his/her understanding and acceptance.     Plan Discussed with:   Anesthesia Plan Comments:         Anesthesia Quick Evaluation

## 2011-06-01 NOTE — H&P (Signed)
I have reviewed the H&P, the patient was re-examined, and I have identified no interval changes in medical condition and plan of care since the history and physical of record  

## 2011-06-01 NOTE — Brief Op Note (Signed)
Pre-Op Dx: Cataract OD Post-Op Dx: Cataract OD Surgeon: Chandon Lazcano Anesthesia: Topical with MAC Implant: Lenstec, Model Softec HD Blood Loss: None Specimen: None Complications: None 

## 2011-06-01 NOTE — Anesthesia Postprocedure Evaluation (Signed)
  Anesthesia Post-op Note  Patient: Jamie Mcgee  Procedure(s) Performed:  CATARACT EXTRACTION PHACO AND INTRAOCULAR LENS PLACEMENT (IOC) - CDE:9.62  Patient Location:  Short Stay  Anesthesia Type: MAC  Level of Consciousness: awake  Airway and Oxygen Therapy: Patient Spontanous Breathing  Post-op Pain: none  Post-op Assessment: Post-op Vital signs reviewed, Patient's Cardiovascular Status Stable, Respiratory Function Stable, Patent Airway, No signs of Nausea or vomiting and Pain level controlled  Post-op Vital Signs: Reviewed and stable  Complications: No apparent anesthesia complications

## 2011-06-01 NOTE — Transfer of Care (Signed)
Immediate Anesthesia Transfer of Care Note  Patient: Jamie Mcgee  Procedure(s) Performed:  CATARACT EXTRACTION PHACO AND INTRAOCULAR LENS PLACEMENT (IOC) - CDE:9.62  Patient Location: Shortstay  Anesthesia Type: MAC  Level of Consciousness: awake  Airway & Oxygen Therapy: Patient Spontanous Breathing   Post-op Assessment: Report given to PACU RN, Post -op Vital signs reviewed and stable and Patient moving all extremities  Post vital signs: Reviewed and stable  Complications: No apparent anesthesia complications

## 2011-06-01 NOTE — Anesthesia Procedure Notes (Signed)
Procedure Name: MAC Date/Time: 06/01/2011 8:23 AM Performed by: Minerva Areola Pre-anesthesia Checklist: Patient identified, Patient being monitored, Emergency Drugs available, Timeout performed and Suction available Patient Re-evaluated:Patient Re-evaluated prior to inductionOxygen Delivery Method: Nasal Cannula

## 2011-06-02 NOTE — Op Note (Signed)
NAMESAE, HANDRICH               ACCOUNT NO.:  [id number]  MEDICAL RECORD NO.:  [id number]  LOCATION:  [facility]                          FACILITY:  [facility]  PHYSICIAN:  Susanne Greenhouse, MD       DATE OF BIRTH:  [birth year]  DATE OF PROCEDURE:  06/01/2011 DATE OF DISCHARGE:  06/01/2011                              OPERATIVE REPORT   PREOPERATIVE DIAGNOSIS:  Combined cataract, right eye, diagnosis code 366.19.  POSTOPERATIVE DIAGNOSIS:  Combined cataract, right eye, diagnosis code 366.19.  OPERATION PERFORMED:  Phacoemulsification with posterior chamber intraocular lens implantation, right eye.  SURGEON:  Bonne Dolores. Laytoya Ion, MD  ANESTHESIA:  General endotracheal anesthesia.  OPERATIVE SUMMARY:  In the preoperative area, dilating drops were placed into the right eye.  The patient was then brought into the operating room where she was placed under general anesthesia.  The eye was then prepped and draped.  Beginning with a 75 blade, a paracentesis port was made at the surgeon's 2 o'clock position.  The anterior chamber was then filled with a 1% nonpreserved lidocaine solution with epinephrine.  This was followed by Viscoat to deepen the chamber.  A small fornix-based peritomy was performed superiorly.  Next, a single iris hook was placed through the limbus superiorly.  A 2.4-mm keratome blade was then used to make a clear corneal incision over the iris hook.  A bent cystotome needle and Utrata forceps were used to create a continuous tear capsulotomy.  Hydrodissection was performed using balanced salt solution on a fine cannula.  The lens nucleus was then removed using phacoemulsification in a quadrant cracking technique.  The cortical material was then removed with irrigation and aspiration.  The capsular bag and anterior chamber were refilled with Provisc.  The wound was widened to approximately 3 mm and a posterior chamber intraocular lens was placed into the capsular bag without difficulty  using an [employer] lens injecting system.  A single 10-0 nylon suture was then used to close the incision as well as stromal hydration.  The Provisc was removed from the anterior chamber and capsular bag with irrigation and aspiration.  At this point, the wounds were tested for leak, which were negative.  The anterior chamber remained deep and stable.  The patient tolerated the procedure well.  There were no operative complications, and she awoke from general anesthesia without problem.  No surgical specimens.  Prosthetic device used is a Lenstec posterior chamber lens, model Softec HD, power of 19.25, serial number is [id number].          ______________________________ Susanne Greenhouse, MD     KEH/MEDQ  D:  06/01/2011  T:  06/02/2011  Job:  [id number]

## 2011-06-05 ENCOUNTER — Encounter (HOSPITAL_COMMUNITY): Payer: Self-pay | Admitting: Ophthalmology

## 2012-09-23 ENCOUNTER — Other Ambulatory Visit: Payer: Self-pay

## 2012-09-23 MED ORDER — CITALOPRAM HYDROBROMIDE 20 MG PO TABS: 20.0000 mg | ORAL_TABLET | Freq: Every day | ORAL | Status: DC

## 2012-09-23 NOTE — Telephone Encounter (Signed)
Patient last seen 11/17/11

## 2012-12-05 ENCOUNTER — Other Ambulatory Visit (HOSPITAL_COMMUNITY): Payer: Self-pay | Admitting: Nurse Practitioner

## 2012-12-05 DIAGNOSIS — Z139 Encounter for screening, unspecified: Secondary | ICD-10-CM

## 2012-12-13 ENCOUNTER — Ambulatory Visit (HOSPITAL_COMMUNITY)
Admission: RE | Admit: 2012-12-13 | Discharge: 2012-12-13 | Disposition: A | Discharge status: Home / Self Care | Payer: Self-pay | Source: Ambulatory Visit | Attending: Nurse Practitioner | Admitting: Nurse Practitioner

## 2012-12-13 DIAGNOSIS — Z139 Encounter for screening, unspecified: Secondary | ICD-10-CM

## 2013-06-22 ENCOUNTER — Encounter (HOSPITAL_COMMUNITY): Payer: Self-pay | Admitting: Emergency Medicine

## 2013-06-22 ENCOUNTER — Emergency Department (HOSPITAL_COMMUNITY)
Admission: EM | Admit: 2013-06-22 | Discharge: 2013-06-22 | Disposition: A | Discharge status: Home / Self Care | Payer: BC Managed Care – PPO | Attending: Emergency Medicine | Admitting: Emergency Medicine

## 2013-06-22 DIAGNOSIS — J329 Chronic sinusitis, unspecified: Secondary | ICD-10-CM | POA: Insufficient documentation

## 2013-06-22 DIAGNOSIS — Z79899 Other long term (current) drug therapy: Secondary | ICD-10-CM | POA: Insufficient documentation

## 2013-06-22 DIAGNOSIS — F17200 Nicotine dependence, unspecified, uncomplicated: Secondary | ICD-10-CM | POA: Insufficient documentation

## 2013-06-22 DIAGNOSIS — F3289 Other specified depressive episodes: Secondary | ICD-10-CM | POA: Insufficient documentation

## 2013-06-22 DIAGNOSIS — F329 Major depressive disorder, single episode, unspecified: Secondary | ICD-10-CM | POA: Insufficient documentation

## 2013-06-22 DIAGNOSIS — B001 Herpesviral vesicular dermatitis: Secondary | ICD-10-CM

## 2013-06-22 DIAGNOSIS — B009 Herpesviral infection, unspecified: Secondary | ICD-10-CM | POA: Insufficient documentation

## 2013-06-22 DIAGNOSIS — I10 Essential (primary) hypertension: Secondary | ICD-10-CM | POA: Insufficient documentation

## 2013-06-22 MED ORDER — DOXYCYCLINE HYCLATE 100 MG PO CAPS
100.0000 mg | ORAL_CAPSULE | Freq: Two times a day (BID) | ORAL | Status: AC
Start: 2013-06-22 — End: 2013-07-02

## 2013-06-22 MED ORDER — PREDNISONE 10 MG PO TABS: ORAL_TABLET | ORAL | Status: DC

## 2013-06-22 NOTE — ED Provider Notes (Signed)
CSN: [id number]     Arrival date & time 06/22/13  1609 History   First MD Initiated Contact with Patient 06/22/13 1621     No chief complaint on file.  (Consider location/radiation/quality/duration/timing/severity/associated sxs/prior Treatment) HPI Comments: 53 y/o female presents to the ED with c/o fever blisters of the lips. Increase redness and itching of the facial cheeks. And "green stuff" coming out of nose. This has been going on for 2-3 days. No high fever reported. She states she gets this one to 2 times each year. She states she is treated with steroid and antibiotic and it "goes away". No sore throat or ear ache. No lesion near eyes.  The history is provided by the patient.    Past Medical History  Diagnosis Date  . HTN (hypertension)   . Depression     hx of  . Fibromuscular dysplasia of renal artery    Past Surgical History  Procedure Laterality Date  . Cataract extraction  08-2010    left   . Tubal ligation  1984    [facility]  . Renal artery angioplasty  2006    right  . Cataract extraction w/phaco  06/01/2011    Procedure: CATARACT EXTRACTION PHACO AND INTRAOCULAR LENS PLACEMENT (IOC);  Surgeon: Tonny Branch;  Location: [facility];  Service: Ophthalmology;  Laterality: Right;  CDE:9.62   Family History  Problem Relation Age of Onset  . Coronary artery disease    . Anesthesia problems Neg Hx   . Hypotension Neg Hx   . Malignant hyperthermia Neg Hx   . Pseudochol deficiency Neg Hx    History  Substance Use Topics  . Smoking status: Current Some Day Smoker -- 1.00 packs/day for 30 years    Types: Cigarettes  . Smokeless tobacco: Not on file  . Alcohol Use: No   OB History   Grav Para Term Preterm Abortions TAB SAB Ect Mult Living                 Review of Systems  Constitutional: Negative for activity change.       All ROS Neg except as noted in HPI  HENT: Positive for congestion and postnasal drip. Negative for nosebleeds.   Eyes: Negative for photophobia and  discharge.  Respiratory: Negative for cough, shortness of breath and wheezing.   Cardiovascular: Negative for chest pain and palpitations.  Gastrointestinal: Negative for abdominal pain and blood in stool.  Genitourinary: Negative for dysuria, frequency and hematuria.  Musculoskeletal: Negative for arthralgias, back pain and neck pain.  Skin: Positive for rash.  Neurological: Negative for dizziness, seizures and speech difficulty.  Psychiatric/Behavioral: Negative for hallucinations and confusion.    Allergies  Review of patient's allergies indicates no known allergies.  Home Medications   Current Outpatient Rx  Name  Route  Sig  Dispense  Refill  . benazepril (LOTENSIN) 20 MG tablet   Oral   Take 20 mg by mouth daily.           . carvedilol (COREG) 6.25 MG tablet   Oral   Take 6.25 mg by mouth 2 (two) times daily with a meal.           . citalopram (CELEXA) 20 MG tablet   Oral   Take 1 tablet (20 mg total) by mouth daily.   30 tablet   0     NTBS   . cloNIDine (CATAPRES) 0.1 MG tablet   Oral   Take 0.1 mg by mouth 2 (two) times  daily.           . fish oil-omega-3 fatty acids 1000 MG capsule   Oral   Take 1 g by mouth 2 (two) times daily.           . Multiple Vitamin (MULITIVITAMIN WITH MINERALS) TABS   Oral   Take 1 tablet by mouth daily.            BP 175/102  Pulse 85  Temp(Src) 98.5 F (36.9 C) (Oral)  Resp 20  Ht 5\' 4"  (1.626 m)  Wt 167 lb (75.751 kg)  BMI 28.65 kg/m2  SpO2 99% Physical Exam  Nursing note and vitals reviewed. Constitutional: She is oriented to person, place, and time. She appears well-developed and well-nourished.  Non-toxic appearance.  HENT:  Head: Normocephalic.  Right Ear: Tympanic membrane and external ear normal.  Left Ear: Tympanic membrane and external ear normal.  There are a few  Lesions, one with a blister of the upper and lower lips. One lesion of the mucosa of the right cheek. The facial cheeks are red and  rosy. No hot areas. Sinus congestion noted. No pain to percussion of the sinuses.  Eyes: EOM and lids are normal. Pupils are equal, round, and reactive to light.  Neck: Normal range of motion. Neck supple. Carotid bruit is not present.  Cardiovascular: Normal rate, regular rhythm, normal heart sounds, intact distal pulses and normal pulses.   Pulmonary/Chest: Breath sounds normal. No respiratory distress.  Abdominal: Soft. Bowel sounds are normal. There is no tenderness. There is no guarding.  Musculoskeletal: Normal range of motion.  Lymphadenopathy:       Head (right side): No submandibular adenopathy present.       Head (left side): No submandibular adenopathy present.    She has no cervical adenopathy.  Neurological: She is alert and oriented to person, place, and time. She has normal strength. No cranial nerve deficit or sensory deficit.  Skin: Skin is warm and dry.  Few  Fever blisters of the lips and one inside the right cheek.  Red rose color of right and left cheek.  Psychiatric: She has a normal mood and affect. Her speech is normal.    ED Course  Procedures (including critical care time) Labs Review Labs Reviewed - No data to display Imaging Review No results found.  EKG Interpretation   None       MDM  No diagnosis found. **I have reviewed nursing notes, vital signs, and all appropriate lab and imaging results for this patient.*  Pt states she has this problem with fever blisters and itching of the face once or twice a year. She states steroids and antibiotics usually take it away. Airway patent. Pt speaks in complete sentences. No lesions near the eyes.  Discussed with patient that herpes labialis and sinusitis are usually viral in nature. Pt request to use what has worked in the past. Rx for doxycycline and prednisone taper given to the patient. She will follow up with PCP if any problem.  Lenox Ahr, PA-C 06/22/13 1700

## 2013-06-22 NOTE — Discharge Instructions (Signed)
Herpes Labialis  You have a fever blister or cold sore (herpes labialis). These painful, grouped sores are caused by one of the herpes viruses (HSV1 most commonly). They are usually found around the lips and mouth, but the same infection can also affect other areas on the face such as the nose and eyes. Herpes infections take about 10 days to heal. They often occur again and again in the same spot. Other symptoms may include numbness and tingling in the involved skin, achiness, fever, and swollen glands in the neck. Colds, emotional stress, injuries, or excess sunlight exposure all seem to make herpes reappear. Herpes lip infections are contagious. Direct contact with these sores can spread the infection. It can also be spread to other parts of your own body.  TREATMENT   Herpes labialis is usually self-limited and resolves within 1 week. To reduce pain and swelling, apply ice packs frequently to the sores or suck on popsicles or frozen juice bars. Antiviral medicine may be used by mouth to shorten the duration of the breakout. Avoid spreading the infection by washing your hands often. Be careful not to touch your eyes or genital areas after handling the infected blisters. Do not kiss or have other intimate contact with others. After the blisters are completely healed you may resume contact. Use sunscreen to lessen recurrences.   If this is your first infection with herpes, or if you have a severe or repeated infections, your caregiver may prescribe one of the anti-viral drugs to speed up the healing. If you have sun-related flare-ups despite the use of sunscreen, starting oral anti-viral medicine before a prolonged exposure (going skiing or to the beach) can prevent most episodes.   SEEK IMMEDIATE MEDICAL CARE IF:  · You develop a headache, sleepiness, high fever, vomiting, or severe weakness.  · You have eye irritation, pain, blurred vision or redness.  · You develop a prolonged infection not getting better in 10  days.  Document Released: 05/22/2005 Document Revised: 08/14/2011 Document Reviewed: 03/26/2009  [employer]® Patient Information ©2014 [employer].

## 2013-06-22 NOTE — ED Notes (Signed)
Pt states itching and redness began Friday evening. States fever blisters also. States this has happened every year for past 3 years. NAD. States they usually give her a steroid and antibiotic.

## 2013-06-22 NOTE — ED Provider Notes (Signed)
Medical screening examination/treatment/procedure(s) were performed by non-physician practitioner and as supervising physician I was immediately available for consultation/collaboration.  EKG Interpretation   None         Saddie Benders. Dorna Mai, MD 06/22/13 2009

## 2013-07-17 ENCOUNTER — Ambulatory Visit (INDEPENDENT_AMBULATORY_CARE_PROVIDER_SITE_OTHER): Payer: Self-pay | Admitting: Family Medicine

## 2013-07-17 ENCOUNTER — Ambulatory Visit (INDEPENDENT_AMBULATORY_CARE_PROVIDER_SITE_OTHER): Payer: Self-pay

## 2013-07-17 VITALS — BP 137/83 | HR 97 | Temp 98.1°F | Ht 64.0 in | Wt 170.0 lb

## 2013-07-17 DIAGNOSIS — S99929A Unspecified injury of unspecified foot, initial encounter: Secondary | ICD-10-CM

## 2013-07-17 DIAGNOSIS — S99921A Unspecified injury of right foot, initial encounter: Secondary | ICD-10-CM

## 2013-07-17 DIAGNOSIS — S8990XA Unspecified injury of unspecified lower leg, initial encounter: Secondary | ICD-10-CM

## 2013-07-17 DIAGNOSIS — S99919A Unspecified injury of unspecified ankle, initial encounter: Secondary | ICD-10-CM

## 2013-07-17 NOTE — Progress Notes (Signed)
Subjective:     Jamie Mcgee is a 53 y.o. female self-referred for evaluation and treatment of a fracture of the 5th metatarsal the right foot. This is evaluated as a personal injury. The injury occurred 5 day ago. Since that time they have been experiencing foot foot pain and swelling.  The pain is currently rated moderate. She was originally seen at [facility] office where an x-ray was done, a fracture of the metatarsal and the patient was placed in a AFO boot.  The patient was subsequently referred to Orthopedics for further management. The immobilization has remained in place and is clean and dry.   Outside reports reviewed: none.  The following portions of the patient's history were reviewed and updated as appropriate: allergies, current medications, past medical history, past social history, past surgical history and problem list.  Review of Systems A comprehensive review of systems was negative.    Objective:    General:   alert  Gait:    Abnormal  Right Foot Circulation:   brisk capillary refill distal to the injury  Skin:   other recent injury  Swelling:  pain is perceived as moderate (4-6 pain scale) swelling was present in the foot  Deformity:  There is an obvious deformity of the foot  Toe ROM:   normal  Ankle ROM:  wrist flexion and extension was normal  Sensation:   intact to light touch  Tenderness:    Point tenderness to the posterior aspect of the foot   Imaging X-rays taken here and reviewed by me.  Radiographs show a fracture of 5th metatarsal which is non-displaced.    Assessment:    Fracture of 5th metatarsal the right foot    Plan:    Non-weight bearing with cast boot They were encouraged to keep their leg elevated and iced for the next 24-48 hours. All their questions were answered fully. Follow up will be in 5 weeks.

## 2013-07-17 NOTE — Progress Notes (Deleted)
   Subjective:    Patient ID: Edison Pace, female    DOB: [birth year], 53 y.o.   MRN: [id number]  HPI  This 53 y.o. female presents for evaluation of ***.  Review of Systems No chest pain, SOB, HA, dizziness, vision change, N/V, diarrhea, constipation, dysuria, urinary urgency or frequency, myalgias, arthralgias or rash.     Objective:   Physical Exam  Vital signs noted  Well developed well nourished female.  HEENT - Head atraumatic Normocephalic                Eyes - PERRLA, Conjuctiva - clear Sclera- Clear EOMI                Ears - EAC's Wnl TM's Wnl Gross Hearing WNL                Nose - Nares patent                 Throat - oropharanx wnl Respiratory - Lungs CTA bilateral Cardiac - RRR S1 and S2 without murmur GI - Abdomen soft Nontender and bowel sounds active x 4 Extremities - No edema. Neuro - Grossly intact.      Assessment & Plan:

## 2013-08-08 ENCOUNTER — Encounter: Payer: Self-pay | Admitting: Family Medicine

## 2013-08-08 ENCOUNTER — Ambulatory Visit (INDEPENDENT_AMBULATORY_CARE_PROVIDER_SITE_OTHER): Payer: BC Managed Care – PPO | Admitting: Family Medicine

## 2013-08-08 ENCOUNTER — Ambulatory Visit (INDEPENDENT_AMBULATORY_CARE_PROVIDER_SITE_OTHER): Payer: BC Managed Care – PPO

## 2013-08-08 VITALS — BP 118/77 | HR 72 | Temp 99.6°F | Wt 176.4 lb

## 2013-08-08 DIAGNOSIS — S92901A Unspecified fracture of right foot, initial encounter for closed fracture: Secondary | ICD-10-CM

## 2013-08-08 DIAGNOSIS — S92909A Unspecified fracture of unspecified foot, initial encounter for closed fracture: Secondary | ICD-10-CM

## 2013-08-08 NOTE — Progress Notes (Signed)
   Subjective:    Patient ID: Jamie Mcgee, female    DOB: 20-Oct-1960, 53 y.o.   MRN: [id number]  HPI  This 53 y.o. female presents for evaluation of follow up on right 5th nondisplaced metatarsal fracture And she is wearing AFO boot and doing well.  She denies any pain at this point.  She is here for follow Up and xray of her right foot.  Review of Systems C/o right 5th metatarsal fracture No chest pain, SOB, HA, dizziness, vision change, N/V, diarrhea, constipation, dysuria, urinary urgency or frequency, myalgias, arthralgias or rash.     Objective:   Physical Exam  Vital signs noted  Well developed well nourished female.  HEENT - Head atraumatic Normocephalic Respiratory - Lungs CTA bilateral Cardiac - RRR S1 and S2 without murmur GI - Abdomen soft Nontender and bowel sounds active x 4 MS - Right foot in AFO boot     Xray right foot - Right 5th metatarsal w/o fx Assessment & Plan:  Foot fracture, right - Plan: DG Foot Complete Right Continue AFO boot for next couple of weeks.  Jamie Penner FNP

## 2013-08-26 ENCOUNTER — Encounter (INDEPENDENT_AMBULATORY_CARE_PROVIDER_SITE_OTHER): Payer: Self-pay

## 2013-08-26 ENCOUNTER — Ambulatory Visit (INDEPENDENT_AMBULATORY_CARE_PROVIDER_SITE_OTHER): Payer: BC Managed Care – PPO

## 2013-08-26 ENCOUNTER — Ambulatory Visit (INDEPENDENT_AMBULATORY_CARE_PROVIDER_SITE_OTHER): Payer: BC Managed Care – PPO | Admitting: Family Medicine

## 2013-08-26 VITALS — BP 127/82 | HR 63 | Temp 98.2°F | Ht 64.0 in | Wt 179.4 lb

## 2013-08-26 DIAGNOSIS — S92909A Unspecified fracture of unspecified foot, initial encounter for closed fracture: Secondary | ICD-10-CM

## 2013-08-26 DIAGNOSIS — S92901A Unspecified fracture of right foot, initial encounter for closed fracture: Secondary | ICD-10-CM

## 2013-08-26 MED ORDER — HYDROCODONE-ACETAMINOPHEN 5-325 MG PO TABS: 1.0000 | ORAL_TABLET | Freq: Four times a day (QID) | ORAL | Status: DC | PRN

## 2013-08-26 NOTE — Progress Notes (Signed)
   Subjective:    Patient ID: Jamie Mcgee, female    DOB: 1961-01-25, 53 y.o.   MRN: [id number]  HPI  This 53 y.o. female presents for evaluation of right foot pain and swelling.  She has a healing Right fifth distal metatarsal fx that is nondisplaced and she has been wearing unnas boot and she Has taken it off because it hurts to walk or wear it now and she is not getting better.  Review of Systems C/o right foot pain. No chest pain, SOB, HA, dizziness, vision change, N/V, diarrhea, constipation, dysuria, urinary urgency or frequency, myalgias, arthralgias or rash.     Objective:   Physical Exam  Right lateral foot TTP and swollen and patient limping with walking.  Xray of right foot - Fracture right distal fifth metatarsal       Assessment & Plan:  Foot fracture, right - Plan: Ambulatory referral to Orthopedic Surgery, DG Foot Complete Right, HYDROcodone-acetaminophen (NORCO) 5-325 MG per tablet.  Discussed results of xray with patient and discussed staying off foot and taking pain meds prn and follow up with orthopedics.  Spent over 30 minutes with patient in visit.  Lysbeth Penner FNP

## 2013-10-30 ENCOUNTER — Other Ambulatory Visit: Payer: BC Managed Care – PPO | Admitting: Family

## 2013-12-19 ENCOUNTER — Ambulatory Visit (INDEPENDENT_AMBULATORY_CARE_PROVIDER_SITE_OTHER): Payer: BC Managed Care – PPO | Admitting: Family

## 2013-12-19 ENCOUNTER — Encounter: Payer: Self-pay | Admitting: Family

## 2013-12-19 VITALS — BP 155/91 | HR 83 | Temp 98.7°F | Ht 66.0 in | Wt 181.0 lb

## 2013-12-19 DIAGNOSIS — F329 Major depressive disorder, single episode, unspecified: Secondary | ICD-10-CM

## 2013-12-19 DIAGNOSIS — Z0000 Encounter for general adult medical examination without abnormal findings: Secondary | ICD-10-CM

## 2013-12-19 DIAGNOSIS — I10 Essential (primary) hypertension: Secondary | ICD-10-CM | POA: Insufficient documentation

## 2013-12-19 DIAGNOSIS — F32A Depression, unspecified: Secondary | ICD-10-CM

## 2013-12-19 DIAGNOSIS — Z01419 Encounter for gynecological examination (general) (routine) without abnormal findings: Secondary | ICD-10-CM

## 2013-12-19 DIAGNOSIS — F3289 Other specified depressive episodes: Secondary | ICD-10-CM

## 2013-12-19 DIAGNOSIS — R21 Rash and other nonspecific skin eruption: Secondary | ICD-10-CM

## 2013-12-19 LAB — POCT URINALYSIS DIPSTICK
Bilirubin, UA: NEGATIVE
Blood, UA: NEGATIVE
GLUCOSE UA: NEGATIVE
KETONES UA: NEGATIVE
Leukocytes, UA: NEGATIVE
Nitrite, UA: NEGATIVE
Protein, UA: NEGATIVE
SPEC GRAV UA: 1.01
Urobilinogen, UA: NEGATIVE
pH, UA: 6

## 2013-12-19 LAB — POCT UA - MICROSCOPIC ONLY
BACTERIA, U MICROSCOPIC: NEGATIVE
CRYSTALS, UR, HPF, POC: NEGATIVE
Casts, Ur, LPF, POC: NEGATIVE
Mucus, UA: NEGATIVE
RBC, URINE, MICROSCOPIC: NEGATIVE
WBC, Ur, HPF, POC: NEGATIVE
Yeast, UA: NEGATIVE

## 2013-12-19 LAB — POCT CBC
Granulocyte percent: 63 %G (ref 37–80)
HEMATOCRIT: 45.6 % (ref 37.7–47.9)
HEMOGLOBIN: 15.2 g/dL (ref 12.2–16.2)
Lymph, poc: 2.6 (ref 0.6–3.4)
MCH: 29.6 pg (ref 27–31.2)
MCHC: 33.4 g/dL (ref 31.8–35.4)
MCV: 88.8 fL (ref 80–97)
MPV: 8.9 fL (ref 0–99.8)
POC Granulocyte: 5.5 (ref 2–6.9)
POC LYMPH PERCENT: 29.8 %L (ref 10–50)
Platelet Count, POC: 237 10*3/uL (ref 142–424)
RBC: 5.1 M/uL (ref 4.04–5.48)
RDW, POC: 13 %
WBC: 8.8 10*3/uL (ref 4.6–10.2)

## 2013-12-19 MED ORDER — TRIAMCINOLONE ACETONIDE 0.025 % EX OINT: 1.0000 "application " | TOPICAL_OINTMENT | Freq: Two times a day (BID) | CUTANEOUS | Status: DC

## 2013-12-19 MED ORDER — BENAZEPRIL HCL 40 MG PO TABS: 40.0000 mg | ORAL_TABLET | Freq: Every day | ORAL | Status: DC

## 2013-12-19 NOTE — Progress Notes (Signed)
Subjective:    Patient ID: Jamie Mcgee, female    DOB: 1960-10-28, 53 y.o.   MRN: [id number]  Hypertension This is a chronic problem. The current episode started more than 1 year ago. The problem has been waxing and waning since onset. The problem is uncontrolled. Pertinent negatives include no anxiety, headaches, palpitations, peripheral edema or shortness of breath. Risk factors for coronary artery disease include family history, obesity, smoking/tobacco exposure and post-menopausal state. Past treatments include ACE inhibitors, direct vasodilators and beta blockers. The current treatment provides moderate improvement. There is no history of kidney disease, CAD/MI, CVA, heart failure or a thyroid problem. There is no history of sleep apnea.  Depression Pt currently taking Celexa 20 mg daily. Pt states she feels her depression is well controlled with no complaints at this time.     Review of Systems  Constitutional: Negative.   HENT: Negative.   Eyes: Negative.   Respiratory: Negative.  Negative for shortness of breath.   Cardiovascular: Negative.  Negative for palpitations.  Gastrointestinal: Negative.   Endocrine: Negative.   Genitourinary: Negative.   Musculoskeletal: Negative.   Skin: Positive for rash.  Neurological: Negative.  Negative for headaches.  Hematological: Negative.   Psychiatric/Behavioral: Negative.   All other systems reviewed and are negative.      Objective:   Physical Exam  Vitals reviewed. Constitutional: She is oriented to person, place, and time. She appears well-developed and well-nourished. No distress.  HENT:  Head: Normocephalic and atraumatic.  Right Ear: External ear normal.  Mouth/Throat: Oropharynx is clear and moist.  Eyes: Pupils are equal, round, and reactive to light.  Neck: Normal range of motion. Neck supple. No thyromegaly present.  Cardiovascular: Normal rate, regular rhythm, normal heart sounds and intact distal pulses.   No  murmur heard. Pulmonary/Chest: Effort normal and breath sounds normal. No respiratory distress. She has no wheezes. Right breast exhibits no inverted nipple, no mass, no nipple discharge, no skin change and no tenderness. Left breast exhibits no inverted nipple, no mass, no nipple discharge, no skin change and no tenderness. Breasts are symmetrical.  Abdominal: Soft. Bowel sounds are normal. She exhibits no distension. There is no tenderness.  Genitourinary: Vagina normal and uterus normal. No vaginal discharge found.  Bimanual exam- no adnexal masses or tenderness, ovaries nonpalpable   Cervix parous and pink- No discharge   Musculoskeletal: Normal range of motion. She exhibits no edema and no tenderness.  Neurological: She is alert and oriented to person, place, and time. She has normal reflexes. No cranial nerve deficit.  Skin: Skin is warm and dry.  Psychiatric: She has a normal mood and affect. Her behavior is normal. Judgment and thought content normal.    BP 155/91  Pulse 83  Temp(Src) 98.7 F (37.1 C) (Oral)  Ht _0  (1.676 m)  Wt 181 lb (82.101 kg)  BMI 29.23 kg/m2       Assessment & Plan:  1. Depressed  2. Essential hypertension, benign - benazepril (LOTENSIN) 40 MG tablet; Take 1 tablet (40 mg total) by mouth daily.  Dispense: 90 tablet; Refill: 3  3. Encounter for routine gynecological examination - Pap IG w/ reflex to HPV when ASC-U  4. Annual physical exam - POCT CBC - CMP14+EGFR - Lipid panel - Vit D  25 hydroxy (rtn osteoporosis monitoring) - POCT UA - Microscopic Only - POCT urinalysis dipstick  5. Rash, skin - triamcinolone (KENALOG) 0.025 % ointment; Apply 1 application topically 2 (two) times daily.  Dispense: 30 g; Refill: 0   Continue all meds Labs pending Health Maintenance reviewed Diet and exercise encouraged RTO 1 year, and 2 weeks for nurse to check blood pressure  Evelina Dun, FNP

## 2013-12-19 NOTE — Patient Instructions (Signed)
Hypertension Hypertension, commonly called high blood pressure, is when the force of blood pumping through your arteries is too strong. Your arteries are the blood vessels that carry blood from your heart throughout your body. A blood pressure reading consists of a higher number over a lower number, such as 110/72. The higher number (systolic) is the pressure inside your arteries when your heart pumps. The lower number (diastolic) is the pressure inside your arteries when your heart relaxes. Ideally you want your blood pressure below 120/80. Hypertension forces your heart to work harder to pump blood. Your arteries may become narrow or stiff. Having hypertension puts you at risk for heart disease, stroke, and other problems.  RISK FACTORS Some risk factors for high blood pressure are controllable. Others are not.  Risk factors you cannot control include:   Race. You may be at higher risk if you are African American.  Age. Risk increases with age.  Gender. Men are at higher risk than women before age 58 years. After age 72, women are at higher risk than men. Risk factors you can control include:  Not getting enough exercise or physical activity.  Being overweight.  Getting too much fat, sugar, calories, or salt in your diet.  Drinking too much alcohol. SIGNS AND SYMPTOMS Hypertension does not usually cause signs or symptoms. Extremely high blood pressure (hypertensive crisis) may cause headache, anxiety, shortness of breath, and nosebleed. DIAGNOSIS  To check if you have hypertension, your health care provider will measure your blood pressure while you are seated, with your arm held at the level of your heart. It should be measured at least twice using the same arm. Certain conditions can cause a difference in blood pressure between your right and left arms. A blood pressure reading that is higher than normal on one occasion does not mean that you need treatment. If one blood pressure reading  is high, ask your health care provider about having it checked again. TREATMENT  Treating high blood pressure includes making lifestyle changes and possibly taking medication. Living a healthy lifestyle can help lower high blood pressure. You may need to change some of your habits. Lifestyle changes may include:  Following the DASH diet. This diet is high in fruits, vegetables, and whole grains. It is low in salt, red meat, and added sugars.  Getting at least 2 1/2 hours of brisk physical activity every week.  Losing weight if necessary.  Not smoking.  Limiting alcoholic beverages.  Learning ways to reduce stress. If lifestyle changes are not enough to get your blood pressure under control, your health care provider may prescribe medicine. You may need to take more than one. Work closely with your health care provider to understand the risks and benefits. HOME CARE INSTRUCTIONS  Have your blood pressure rechecked as directed by your health care provider.   Only take medicine as directed by your health care provider. Follow the directions carefully. Blood pressure medicines must be taken as prescribed. The medicine does not work as well when you skip doses. Skipping doses also puts you at risk for problems.   Do not smoke.   Monitor your blood pressure at home as directed by your health care provider. SEEK MEDICAL CARE IF:   You think you are having a reaction to medicines taken.  You have recurrent headaches or feel dizzy.  You have swelling in your ankles.  You have trouble with your vision. SEEK IMMEDIATE MEDICAL CARE IF:  You develop a severe headache or  confusion.  You have unusual weakness, numbness, or feel faint.  You have severe chest or abdominal pain.  You vomit repeatedly.  You have trouble breathing. MAKE SURE YOU:   Understand these instructions.  Will watch your condition.  Will get help right away if you are not doing well or get  worse. Document Released: 05/22/2005 Document Revised: 05/27/2013 Document Reviewed: 03/14/2013 [facility] [employer] Patient Information 2015 [place], [place]. This information is not intended to replace advice given to you by your health care provider. Make sure you discuss any questions you have with your health care provider. Health Maintenance, Female A healthy lifestyle and preventative care can promote health and wellness.  Maintain regular health, dental, and eye exams.  Eat a healthy diet. Foods like vegetables, fruits, whole grains, low-fat dairy products, and lean protein foods contain the nutrients you need without too many calories. Decrease your intake of foods high in solid fats, added sugars, and salt. Get information about a proper diet from your caregiver, if necessary.  Regular physical exercise is one of the most important things you can do for your health. Most adults should get at least 150 minutes of moderate-intensity exercise (any activity that increases your heart rate and causes you to sweat) each week. In addition, most adults need muscle-strengthening exercises on 2 or more days a week.   Maintain a healthy weight. The body mass index (BMI) is a screening tool to identify possible weight problems. It provides an estimate of body fat based on height and weight. Your caregiver can help determine your BMI, and can help you achieve or maintain a healthy weight. For adults 20 years and older:  A BMI below 18.5 is considered underweight.  A BMI of 18.5 to 24.9 is normal.  A BMI of 25 to 29.9 is considered overweight.  A BMI of 30 and above is considered obese.  Maintain normal blood lipids and cholesterol by exercising and minimizing your intake of saturated fat. Eat a balanced diet with plenty of fruits and vegetables. Blood tests for lipids and cholesterol should begin at age 57 and be repeated every 5 years. If your lipid or cholesterol levels are high, you are over 50, or you are  a high risk for heart disease, you may need your cholesterol levels checked more frequently.Ongoing high lipid and cholesterol levels should be treated with medicines if diet and exercise are not effective.  If you smoke, find out from your caregiver how to quit. If you do not use tobacco, do not start.  Lung cancer screening is recommended for adults aged 42-80 years who are at high risk for developing lung cancer because of a history of smoking. Yearly low-dose computed tomography (CT) is recommended for people who have at least a 30-pack-year history of smoking and are a current smoker or have quit within the past 15 years. A pack year of smoking is smoking an average of 1 pack of cigarettes a day for 1 year (for example: 1 pack a day for 30 years or 2 packs a day for 15 years). Yearly screening should continue until the smoker has stopped smoking for at least 15 years. Yearly screening should also be stopped for people who develop a health problem that would prevent them from having lung cancer treatment.  If you are pregnant, do not drink alcohol. If you are breastfeeding, be very cautious about drinking alcohol. If you are not pregnant and choose to drink alcohol, do not exceed 1 drink per day. One  drink is considered to be 12 ounces (355 mL) of beer, 5 ounces (148 mL) of wine, or 1.5 ounces (44 mL) of liquor.  Avoid use of street drugs. Do not share needles with anyone. Ask for help if you need support or instructions about stopping the use of drugs.  High blood pressure causes heart disease and increases the risk of stroke. Blood pressure should be checked at least every 1 to 2 years. Ongoing high blood pressure should be treated with medicines, if weight loss and exercise are not effective.  If you are 52 to 54 years old, ask your caregiver if you should take aspirin to prevent strokes.  Diabetes screening involves taking a blood sample to check your fasting blood sugar level. This should be  done once every 3 years, after age 67, if you are within normal weight and without risk factors for diabetes. Testing should be considered at a younger age or be carried out more frequently if you are overweight and have at least 1 risk factor for diabetes.  Breast cancer screening is essential preventative care for women. You should practice "breast self-awareness." This means understanding the normal appearance and feel of your breasts and may include breast self-examination. Any changes detected, no matter how small, should be reported to a caregiver. Women in their 65s and 30s should have a clinical breast exam (CBE) by a caregiver as part of a regular health exam every 1 to 3 years. After age 43, women should have a CBE every year. Starting at age 17, women should consider having a mammogram (breast X-ray) every year. Women who have a family history of breast cancer should talk to their caregiver about genetic screening. Women at a high risk of breast cancer should talk to their caregiver about having an MRI and a mammogram every year.  Breast cancer gene (BRCA)-related cancer risk assessment is recommended for women who have family members with BRCA-related cancers. BRCA-related cancers include breast, ovarian, tubal, and peritoneal cancers. Having family members with these cancers may be associated with an increased risk for harmful changes (mutations) in the breast cancer genes BRCA1 and BRCA2. Results of the assessment will determine the need for genetic counseling and BRCA1 and BRCA2 testing.  The Pap test is a screening test for cervical cancer. Women should have a Pap test starting at age 63. Between ages 32 and 10, Pap tests should be repeated every 2 years. Beginning at age 11, you should have a Pap test every 3 years as long as the past 3 Pap tests have been normal. If you had a hysterectomy for a problem that was not cancer or a condition that could lead to cancer, then you no longer need Pap  tests. If you are between ages 83 and 71, and you have had normal Pap tests going back 10 years, you no longer need Pap tests. If you have had past treatment for cervical cancer or a condition that could lead to cancer, you need Pap tests and screening for cancer for at least 20 years after your treatment. If Pap tests have been discontinued, risk factors (such as a new sexual partner) need to be reassessed to determine if screening should be resumed. Some women have medical problems that increase the chance of getting cervical cancer. In these cases, your caregiver may recommend more frequent screening and Pap tests.  The human papillomavirus (HPV) test is an additional test that may be used for cervical cancer screening. The HPV test looks  for the virus that can cause the cell changes on the cervix. The cells collected during the Pap test can be tested for HPV. The HPV test could be used to screen women aged 36 years and older, and should be used in women of any age who have unclear Pap test results. After the age of 93, women should have HPV testing at the same frequency as a Pap test.  Colorectal cancer can be detected and often prevented. Most routine colorectal cancer screening begins at the age of 41 and continues through age 76. However, your caregiver may recommend screening at an earlier age if you have risk factors for colon cancer. On a yearly basis, your caregiver may provide home test kits to check for hidden blood in the stool. Use of a small camera at the end of a tube, to directly examine the colon (sigmoidoscopy or colonoscopy), can detect the earliest forms of colorectal cancer. Talk to your caregiver about this at age 45, when routine screening begins. Direct examination of the colon should be repeated every 5 to 10 years through age 88, unless early forms of pre-cancerous polyps or small growths are found.  Hepatitis C blood testing is recommended for all people born from [birth year] through 1966  and any individual with known risks for hepatitis C.  Practice safe sex. Use condoms and avoid high-risk sexual practices to reduce the spread of sexually transmitted infections (STIs). Sexually active women aged 59 and younger should be checked for Chlamydia, which is a common sexually transmitted infection. Older women with new or multiple partners should also be tested for Chlamydia. Testing for other STIs is recommended if you are sexually active and at increased risk.  Osteoporosis is a disease in which the bones lose minerals and strength with aging. This can result in serious bone fractures. The risk of osteoporosis can be identified using a bone density scan. Women ages 70 and over and women at risk for fractures or osteoporosis should discuss screening with their caregivers. Ask your caregiver whether you should be taking a calcium supplement or vitamin D to reduce the rate of osteoporosis.  Menopause can be associated with physical symptoms and risks. Hormone replacement therapy is available to decrease symptoms and risks. You should talk to your caregiver about whether hormone replacement therapy is right for you.  Use sunscreen. Apply sunscreen liberally and repeatedly throughout the day. You should seek shade when your shadow is shorter than you. Protect yourself by wearing long sleeves, pants, a wide-brimmed hat, and sunglasses year round, whenever you are outdoors.  Notify your caregiver of new moles or changes in moles, especially if there is a change in shape or color. Also notify your caregiver if a mole is larger than the size of a pencil eraser.  Stay current with your immunizations. Document Released: 12/05/2010 Document Revised: 09/16/2012 Document Reviewed: 04/23/2013 [facility] Patient Information 2015 [place], [place]. This information is not intended to replace advice given to you by your health care provider. Make sure you discuss any questions you have with your health care  provider.

## 2013-12-20 LAB — CMP14+EGFR
A/G RATIO: 2 (ref 1.1–2.5)
ALT: 32 IU/L (ref 0–32)
AST: 27 IU/L (ref 0–40)
Albumin: 4.5 g/dL (ref 3.5–5.5)
Alkaline Phosphatase: 96 IU/L (ref 39–117)
BUN / CREAT RATIO: 10 (ref 9–23)
BUN: 8 mg/dL (ref 6–24)
CALCIUM: 9.7 mg/dL (ref 8.7–10.2)
CO2: 22 mmol/L (ref 18–29)
CREATININE: 0.81 mg/dL (ref 0.57–1.00)
Chloride: 102 mmol/L (ref 97–108)
GFR, EST AFRICAN AMERICAN: 96 mL/min/{1.73_m2} (ref >59)
GFR, EST NON AFRICAN AMERICAN: 83 mL/min/{1.73_m2} (ref >59)
GLOBULIN, TOTAL: 2.2 g/dL (ref 1.5–4.5)
Glucose: 92 mg/dL (ref 65–99)
Potassium: 4.2 mmol/L (ref 3.5–5.2)
SODIUM: 145 mmol/L — AB (ref 134–144)
Total Protein: 6.7 g/dL (ref 6.0–8.5)

## 2013-12-20 LAB — LIPID PANEL
CHOL/HDL RATIO: 5.2 ratio — AB (ref 0.0–4.4)
Cholesterol, Total: 197 mg/dL (ref 100–199)
HDL: 38 mg/dL — AB (ref >39)
LDL Calculated: 92 mg/dL (ref 0–99)
Triglycerides: 337 mg/dL — ABNORMAL HIGH (ref 0–149)
VLDL Cholesterol Cal: 67 mg/dL — ABNORMAL HIGH (ref 5–40)

## 2013-12-20 LAB — VITAMIN D 25 HYDROXY (VIT D DEFICIENCY, FRACTURES): VIT D 25 HYDROXY: 33.8 ng/mL (ref 30.0–100.0)

## 2013-12-22 ENCOUNTER — Other Ambulatory Visit: Payer: Self-pay | Admitting: Family

## 2013-12-22 MED ORDER — ATORVASTATIN CALCIUM 40 MG PO TABS: 40.0000 mg | ORAL_TABLET | Freq: Every day | ORAL | Status: DC

## 2013-12-23 ENCOUNTER — Other Ambulatory Visit: Payer: Self-pay | Admitting: Family Medicine

## 2013-12-23 DIAGNOSIS — Z1231 Encounter for screening mammogram for malignant neoplasm of breast: Secondary | ICD-10-CM

## 2013-12-23 LAB — PAP IG W/ RFLX HPV ASCU: PAP Smear Comment: 0

## 2013-12-26 ENCOUNTER — Ambulatory Visit (HOSPITAL_COMMUNITY)
Admission: RE | Admit: 2013-12-26 | Discharge: 2013-12-26 | Disposition: A | Discharge status: Home / Self Care | Payer: BC Managed Care – PPO | Source: Ambulatory Visit | Attending: Family Medicine | Admitting: Family Medicine

## 2013-12-26 DIAGNOSIS — Z1231 Encounter for screening mammogram for malignant neoplasm of breast: Secondary | ICD-10-CM | POA: Insufficient documentation

## 2013-12-31 ENCOUNTER — Telehealth: Payer: Self-pay | Admitting: Family

## 2014-01-02 ENCOUNTER — Telehealth: Payer: Self-pay | Admitting: Family

## 2014-01-02 MED ORDER — CITALOPRAM HYDROBROMIDE 20 MG PO TABS: 20.0000 mg | ORAL_TABLET | Freq: Every day | ORAL | Status: DC

## 2014-01-02 MED ORDER — CARVEDILOL 6.25 MG PO TABS: 6.2500 mg | ORAL_TABLET | Freq: Two times a day (BID) | ORAL | Status: DC

## 2014-01-02 NOTE — Telephone Encounter (Signed)
RX refills sent

## 2014-01-03 MED ORDER — CLONIDINE HCL 0.1 MG PO TABS: 0.1000 mg | ORAL_TABLET | Freq: Two times a day (BID) | ORAL | Status: DC

## 2014-01-03 NOTE — Telephone Encounter (Signed)
Discussed with patient. She will continue to monitor blood pressure and will follow up when she can.

## 2014-01-20 ENCOUNTER — Encounter: Payer: Self-pay | Admitting: Family

## 2014-01-20 ENCOUNTER — Ambulatory Visit (INDEPENDENT_AMBULATORY_CARE_PROVIDER_SITE_OTHER): Payer: BC Managed Care – PPO | Admitting: Family

## 2014-01-20 VITALS — BP 129/84 | HR 68 | Temp 99.2°F | Ht 66.0 in | Wt 181.0 lb

## 2014-01-20 DIAGNOSIS — N390 Urinary tract infection, site not specified: Secondary | ICD-10-CM

## 2014-01-20 DIAGNOSIS — R300 Dysuria: Secondary | ICD-10-CM

## 2014-01-20 LAB — POCT URINALYSIS DIPSTICK
BILIRUBIN UA: NEGATIVE
Glucose, UA: NEGATIVE
KETONES UA: NEGATIVE
Nitrite, UA: NEGATIVE
Protein, UA: NEGATIVE
Spec Grav, UA: 1.01
Urobilinogen, UA: NEGATIVE
pH, UA: 5

## 2014-01-20 LAB — POCT UA - MICROSCOPIC ONLY
Casts, Ur, LPF, POC: NEGATIVE
Crystals, Ur, HPF, POC: NEGATIVE
MUCUS UA: NEGATIVE
YEAST UA: NEGATIVE

## 2014-01-20 MED ORDER — NITROFURANTOIN MONOHYD MACRO 100 MG PO CAPS
100.0000 mg | ORAL_CAPSULE | Freq: Two times a day (BID) | ORAL | Status: DC
Start: 2014-01-20 — End: 2014-01-27

## 2014-01-20 NOTE — Patient Instructions (Signed)

## 2014-01-20 NOTE — Progress Notes (Signed)
Subjective:    Patient ID: Jamie Mcgee, female    DOB: [birth year], 53 y.o.   MRN: [id number]  Urinary Tract Infection  This is a new problem. The current episode started 1 to 4 weeks ago ("about a week and half ago"). The problem occurs every urination. The problem has been gradually worsening. The quality of the pain is described as burning. The pain is at a severity of 10/10. The pain is moderate. Associated symptoms include frequency, hematuria, hesitancy and urgency. Pertinent negatives include no chills, discharge, flank pain, nausea or vomiting. She has tried increased fluids for the symptoms. The treatment provided mild relief.      Review of Systems  Constitutional: Negative.  Negative for chills.  HENT: Negative.   Eyes: Negative.   Respiratory: Negative.  Negative for shortness of breath.   Cardiovascular: Negative.  Negative for palpitations.  Gastrointestinal: Negative.  Negative for nausea and vomiting.  Endocrine: Negative.   Genitourinary: Positive for hesitancy, urgency, frequency and hematuria. Negative for flank pain.  Musculoskeletal: Negative.   Neurological: Negative.  Negative for headaches.  Hematological: Negative.   Psychiatric/Behavioral: Negative.   All other systems reviewed and are negative.      Objective:   Physical Exam  Vitals reviewed. Constitutional: She is oriented to person, place, and time. She appears well-developed and well-nourished. No distress.  Cardiovascular: Normal rate, regular rhythm, normal heart sounds and intact distal pulses.   No murmur heard. Pulmonary/Chest: Effort normal and breath sounds normal. No respiratory distress. She has no wheezes.  Abdominal: Soft. Bowel sounds are normal. She exhibits no distension. There is no tenderness.  Musculoskeletal: Normal range of motion. She exhibits no edema and no tenderness.  Negative for CVA tenderness   Neurological: She is alert and oriented to person, place, and time. She  has normal reflexes. No cranial nerve deficit.  Skin: Skin is warm and dry.  Psychiatric: She has a normal mood and affect. Her behavior is normal. Judgment and thought content normal.   Results for orders placed in visit on 01/20/14  POCT URINALYSIS DIPSTICK      Result Value Ref Range   Color, UA gold     Clarity, UA clear     Glucose, UA neg     Bilirubin, UA neg     Ketones, UA neg     Spec Grav, UA 1.010     Blood, UA mod     pH, UA 5.0     Protein, UA neg     Urobilinogen, UA negative     Nitrite, UA neg     Leukocytes, UA small (1+)    POCT UA - MICROSCOPIC ONLY      Result Value Ref Range   WBC, Ur, HPF, POC 5-8     RBC, urine, microscopic 1-5     Bacteria, U Microscopic occ     Mucus, UA neg     Epithelial cells, urine per micros occ     Crystals, Ur, HPF, POC neg     Casts, Ur, LPF, POC neg     Yeast, UA neg       BP 129/84  Pulse 68  Temp(Src) 99.2 F (37.3 C) (Oral)  Ht 5\' 6"  (1.676 m)  Wt 181 lb (82.101 kg)  BMI 29.23 kg/m2      Assessment & Plan:  1. Dysuria - POCT urinalysis dipstick - POCT UA - Microscopic Only  2. Urinary tract infection, site not specified -Force fluids AZO  over the counter X2 days RTO prn - nitrofurantoin, macrocrystal-monohydrate, (MACROBID) 100 MG capsule; Take 1 capsule (100 mg total) by mouth 2 (two) times daily.  Dispense: 10 capsule; Refill: 0

## 2014-01-27 ENCOUNTER — Ambulatory Visit (INDEPENDENT_AMBULATORY_CARE_PROVIDER_SITE_OTHER): Payer: BC Managed Care – PPO | Admitting: Family Medicine

## 2014-01-27 ENCOUNTER — Encounter: Payer: Self-pay | Admitting: Family Medicine

## 2014-01-27 VITALS — BP 143/86 | HR 73 | Temp 98.1°F | Ht 66.0 in | Wt 181.0 lb

## 2014-01-27 DIAGNOSIS — F17200 Nicotine dependence, unspecified, uncomplicated: Secondary | ICD-10-CM

## 2014-01-27 DIAGNOSIS — R109 Unspecified abdominal pain: Secondary | ICD-10-CM

## 2014-01-27 DIAGNOSIS — N390 Urinary tract infection, site not specified: Secondary | ICD-10-CM

## 2014-01-27 DIAGNOSIS — R319 Hematuria, unspecified: Secondary | ICD-10-CM

## 2014-01-27 DIAGNOSIS — R300 Dysuria: Secondary | ICD-10-CM

## 2014-01-27 DIAGNOSIS — Z720 Tobacco use: Secondary | ICD-10-CM

## 2014-01-27 LAB — POCT URINALYSIS DIPSTICK
Bilirubin, UA: NEGATIVE
Blood, UA: NEGATIVE
Glucose, UA: NEGATIVE
Ketones, UA: NEGATIVE
Nitrite, UA: NEGATIVE
Protein, UA: NEGATIVE
Spec Grav, UA: <=1.005
Urobilinogen, UA: NEGATIVE
pH, UA: 5

## 2014-01-27 LAB — POCT UA - MICROSCOPIC ONLY
Bacteria, U Microscopic: NEGATIVE
Casts, Ur, LPF, POC: NEGATIVE
Crystals, Ur, HPF, POC: NEGATIVE
Mucus, UA: NEGATIVE
RBC, urine, microscopic: NEGATIVE
Yeast, UA: NEGATIVE

## 2014-01-27 MED ORDER — CIPROFLOXACIN HCL 500 MG PO TABS: 500.0000 mg | ORAL_TABLET | Freq: Two times a day (BID) | ORAL | Status: DC

## 2014-01-27 MED ORDER — PHENAZOPYRIDINE HCL 200 MG PO TABS: 200.0000 mg | ORAL_TABLET | Freq: Three times a day (TID) | ORAL | Status: DC | PRN

## 2014-01-27 NOTE — Progress Notes (Signed)
   Subjective:    Patient ID: Jamie Mcgee, female    DOB: 01/23/1961, 53 y.o.   MRN: [id number]  HPI This 53 y.o. female presents for evaluation of urinary frequency and dysuria.   Review of Systems C/o dysuria   No chest pain, SOB, HA, dizziness, vision change, N/V, diarrhea, constipation, myalgias, arthralgias or rash.  Objective:   Physical Exam  Vital signs noted  Well developed well nourished female.  HEENT - Head atraumatic Normocephalic                Eyes - PERRLA, Conjuctiva - clear Sclera- Clear EOMI                Ears - EAC's Wnl TM's Wnl Gross Hearing WNL                Throat - oropharanx wnl Respiratory - Lungs CTA bilateral Cardiac - RRR S1 and S2 without murmur GI - Abdomen soft Nontender and bowel sounds active x 4     Results for orders placed in visit on 01/27/14  POCT URINALYSIS DIPSTICK      Result Value Ref Range   Color, UA yellow     Clarity, UA clear     Glucose, UA neg     Bilirubin, UA neg     Ketones, UA neg     Spec Grav, UA <=1.005     Blood, UA neg     pH, UA 5.0     Protein, UA neg     Urobilinogen, UA negative     Nitrite, UA neg     Leukocytes, UA Trace    POCT UA - MICROSCOPIC ONLY      Result Value Ref Range   WBC, Ur, HPF, POC 1-5     RBC, urine, microscopic neg     Bacteria, U Microscopic neg     Mucus, UA neg     Epithelial cells, urine per micros occ     Crystals, Ur, HPF, POC neg     Casts, Ur, LPF, POC neg     Yeast, UA neg     Assessment & Plan:  Hematuria - Plan: POCT urinalysis dipstick, POCT UA - Microscopic Only  Flank pain - Plan: POCT urinalysis dipstick, POCT UA - Microscopic Only  Dysuria - Plan: POCT urinalysis dipstick, POCT UA - Microscopic Only  Urinary tract infection with hematuria, site unspecified - Plan: Urine culture, ciprofloxacin (CIPRO) 500 MG tablet, phenazopyridine (PYRIDIUM) 200 MG tablet  Tobacco abuse - Discussed DC cigarette smoking strategies with patient for over 10 minutes in  visit.  Lysbeth Penner FNP

## 2014-01-29 LAB — URINE CULTURE

## 2014-06-03 ENCOUNTER — Other Ambulatory Visit: Payer: Self-pay | Admitting: Family

## 2014-06-19 ENCOUNTER — Emergency Department (HOSPITAL_COMMUNITY)
Admission: EM | Admit: 2014-06-19 | Discharge: 2014-06-19 | Disposition: A | Discharge status: Home / Self Care | Payer: BC Managed Care – PPO | Attending: Emergency Medicine | Admitting: Emergency Medicine

## 2014-06-19 ENCOUNTER — Emergency Department (HOSPITAL_COMMUNITY): Payer: BC Managed Care – PPO

## 2014-06-19 ENCOUNTER — Encounter (HOSPITAL_COMMUNITY): Payer: Self-pay | Admitting: *Deleted

## 2014-06-19 DIAGNOSIS — I10 Essential (primary) hypertension: Secondary | ICD-10-CM | POA: Diagnosis not present

## 2014-06-19 DIAGNOSIS — Z9861 Coronary angioplasty status: Secondary | ICD-10-CM | POA: Insufficient documentation

## 2014-06-19 DIAGNOSIS — Z7952 Long term (current) use of systemic steroids: Secondary | ICD-10-CM | POA: Insufficient documentation

## 2014-06-19 DIAGNOSIS — R091 Pleurisy: Secondary | ICD-10-CM | POA: Diagnosis present

## 2014-06-19 DIAGNOSIS — Z79899 Other long term (current) drug therapy: Secondary | ICD-10-CM | POA: Diagnosis not present

## 2014-06-19 DIAGNOSIS — J40 Bronchitis, not specified as acute or chronic: Secondary | ICD-10-CM | POA: Diagnosis not present

## 2014-06-19 DIAGNOSIS — Z720 Tobacco use: Secondary | ICD-10-CM | POA: Diagnosis not present

## 2014-06-19 DIAGNOSIS — R079 Chest pain, unspecified: Secondary | ICD-10-CM

## 2014-06-19 DIAGNOSIS — R0789 Other chest pain: Secondary | ICD-10-CM

## 2014-06-19 DIAGNOSIS — Z792 Long term (current) use of antibiotics: Secondary | ICD-10-CM | POA: Diagnosis not present

## 2014-06-19 DIAGNOSIS — F329 Major depressive disorder, single episode, unspecified: Secondary | ICD-10-CM | POA: Insufficient documentation

## 2014-06-19 DIAGNOSIS — R51 Headache: Secondary | ICD-10-CM | POA: Insufficient documentation

## 2014-06-19 LAB — CBC WITH DIFFERENTIAL/PLATELET
BASOS PCT: 1 % (ref 0–1)
Basophils Absolute: 0 10*3/uL (ref 0.0–0.1)
EOS ABS: 0.1 10*3/uL (ref 0.0–0.7)
EOS PCT: 3 % (ref 0–5)
HCT: 39.3 % (ref 36.0–46.0)
HEMOGLOBIN: 13.2 g/dL (ref 12.0–15.0)
LYMPHS PCT: 45 % (ref 12–46)
Lymphs Abs: 2.6 10*3/uL (ref 0.7–4.0)
MCH: 30.4 pg (ref 26.0–34.0)
MCHC: 33.6 g/dL (ref 30.0–36.0)
MCV: 90.6 fL (ref 78.0–100.0)
MONO ABS: 0.4 10*3/uL (ref 0.1–1.0)
Monocytes Relative: 6 % (ref 3–12)
NEUTROS PCT: 45 % (ref 43–77)
Neutro Abs: 2.6 10*3/uL (ref 1.7–7.7)
Platelets: 210 10*3/uL (ref 150–400)
RBC: 4.34 MIL/uL (ref 3.87–5.11)
RDW: 12.6 % (ref 11.5–15.5)
WBC: 5.7 10*3/uL (ref 4.0–10.5)

## 2014-06-19 LAB — D-DIMER, QUANTITATIVE (NOT AT ARMC): D DIMER QUANT: 0.34 ug{FEU}/mL (ref 0.00–0.48)

## 2014-06-19 LAB — BASIC METABOLIC PANEL
ANION GAP: 5 (ref 5–15)
BUN: 10 mg/dL (ref 6–23)
CHLORIDE: 108 meq/L (ref 96–112)
CO2: 26 mmol/L (ref 19–32)
Calcium: 8.6 mg/dL (ref 8.4–10.5)
Creatinine, Ser: 0.73 mg/dL (ref 0.50–1.10)
GFR calc Af Amer: >90 mL/min (ref >90)
GFR calc non Af Amer: >90 mL/min (ref >90)
Glucose, Bld: 144 mg/dL — ABNORMAL HIGH (ref 70–99)
Potassium: 4.1 mmol/L (ref 3.5–5.1)
Sodium: 139 mmol/L (ref 135–145)

## 2014-06-19 MED ORDER — AZITHROMYCIN 250 MG PO TABS: ORAL_TABLET | ORAL | Status: DC

## 2014-06-19 MED ORDER — IPRATROPIUM-ALBUTEROL 0.5-2.5 (3) MG/3ML IN SOLN
3.0000 mL | Freq: Once | RESPIRATORY_TRACT | Status: AC
  Administered 2014-06-19: 3 mL via RESPIRATORY_TRACT
  Filled 2014-06-19: qty 3

## 2014-06-19 MED ORDER — ALBUTEROL SULFATE HFA 108 (90 BASE) MCG/ACT IN AERS
2.0000 | INHALATION_SPRAY | RESPIRATORY_TRACT | Status: DC | PRN
  Administered 2014-06-19: 2 via RESPIRATORY_TRACT
  Filled 2014-06-19: qty 6.7

## 2014-06-19 MED ORDER — PREDNISONE 10 MG PO TABS: 20.0000 mg | ORAL_TABLET | Freq: Two times a day (BID) | ORAL | Status: DC

## 2014-06-19 MED ORDER — HYDROCOD POLST-CHLORPHEN POLST 10-8 MG/5ML PO LQCR: 5.0000 mL | Freq: Two times a day (BID) | ORAL | Status: DC | PRN

## 2014-06-19 NOTE — ED Notes (Signed)
Pt states she has had productive cough x4 days, this morning noticed pain in lt chest/rib/abdomen/flank.

## 2014-06-19 NOTE — ED Provider Notes (Signed)
CSN: [id number]     Arrival date & time 06/19/14  0754 History   None    Chief Complaint  Patient presents with  . Pleurisy    left rib pain     (Consider location/radiation/quality/duration/timing/severity/associated sxs/prior Treatment) Patient is a 54 y.o. female presenting with URI. The history is provided by the patient.  URI Presenting symptoms: congestion, cough, facial pain and sore throat   Severity:  Moderate Onset quality:  Gradual Duration:  4 days Timing:  Constant Progression:  Worsening Chronicity:  New Relieved by:  Nothing Worsened by:  Nothing tried Ineffective treatments:  Decongestant and OTC medications Associated symptoms: headaches, myalgias, sinus pain, sneezing and wheezing    ELISAMA THISSEN is a 54 y.o. female who presents to the ED with congestion, cough and sinus pain that started 4 days ago. She states that it started with head feeling stopped up and a lot of drainage just like a bad cold. She took OTC medications but because of her HTN was afraid to take to much. Today when she woke she had pain in the left rib area that radiated to the abdomen and left side of back. She has been coughing but didn't think enough to cause the pain. She smokes 1 ppd.   Past Medical History  Diagnosis Date  . HTN (hypertension)   . Depression     hx of  . Fibromuscular dysplasia of renal artery    Past Surgical History  Procedure Laterality Date  . Cataract extraction  08-2010    left   . Tubal ligation  1984    [facility]  . Renal artery angioplasty  2006    right  . Cataract extraction w/phaco  06/01/2011    Procedure: CATARACT EXTRACTION PHACO AND INTRAOCULAR LENS PLACEMENT (IOC);  Surgeon: Tonny Branch;  Location: [facility];  Service: Ophthalmology;  Laterality: Right;  CDE:9.62  . Appendectomy  1993   Family History  Problem Relation Age of Onset  . Coronary artery disease    . Anesthesia problems Neg Hx   . Hypotension Neg Hx   . Malignant hyperthermia Neg Hx    . Pseudochol deficiency Neg Hx    History  Substance Use Topics  . Smoking status: Current Every Day Smoker -- 1.00 packs/day for 30 years    Types: Cigarettes  . Smokeless tobacco: Not on file  . Alcohol Use: No   OB History    No data available     Review of Systems  HENT: Positive for congestion, sneezing and sore throat.   Respiratory: Positive for cough and wheezing.        Left rib pain and chest wall pain   Musculoskeletal: Positive for myalgias.  Neurological: Positive for headaches.  all other systems negative    Allergies  Review of patient's allergies indicates no known allergies.  Home Medications   Prior to Admission medications   Medication Sig Start Date End Date Taking? Authorizing Provider  benazepril (LOTENSIN) 40 MG tablet Take 1 tablet (40 mg total) by mouth daily. 12/19/13  Yes Sharion Balloon, FNP  carvedilol (COREG) 6.25 MG tablet Take 1 tablet (6.25 mg total) by mouth 2 (two) times daily with a meal. 01/02/14  Yes Sharion Balloon, FNP  citalopram (CELEXA) 20 MG tablet Take 1 tablet (20 mg total) by mouth daily. 01/02/14  Yes Sharion Balloon, FNP  cloNIDine (CATAPRES) 0.1 MG tablet TAKE ONE TABLET BY MOUTH TWICE DAILY 06/04/14  Yes Sharion Balloon, FNP  Krill Oil 1000 MG CAPS Take 1,000 mg by mouth daily.   Yes Historical Provider, MD  Multiple Vitamin (MULITIVITAMIN WITH MINERALS) TABS Take 1 tablet by mouth daily.     Yes Historical Provider, MD  atorvastatin (LIPITOR) 40 MG tablet Take 1 tablet (40 mg total) by mouth daily. Patient not taking: Reported on 06/19/2014 12/22/13   Sharion Balloon, FNP  azithromycin (ZITHROMAX Z-PAK) 250 MG tablet Take 2 tablets today and then one tablet daily for infecton 06/19/14   Hope Bunnie Pion, NP  chlorpheniramine-HYDROcodone [facility]) 10-8 MG/5ML LQCR Take 5 mLs by mouth every 12 (twelve) hours as needed for cough. 06/19/14   Hope Bunnie Pion, NP  ciprofloxacin (CIPRO) 500 MG tablet Take 1 tablet (500 mg  total) by mouth 2 (two) times daily. Patient not taking: Reported on 06/19/2014 01/27/14   Lysbeth Penner, FNP  phenazopyridine (PYRIDIUM) 200 MG tablet Take 1 tablet (200 mg total) by mouth 3 (three) times daily as needed for pain. Patient not taking: Reported on 06/19/2014 01/27/14   Lysbeth Penner, FNP  predniSONE (DELTASONE) 10 MG tablet Take 2 tablets (20 mg total) by mouth 2 (two) times daily with a meal. 06/19/14   Hope Bunnie Pion, NP   BP 103/71 mmHg  Pulse 66  Temp(Src) 97.5 F (36.4 C) (Oral)  Resp 14  Ht 5' 4.5" (1.638 m)  Wt 180 lb (81.647 kg)  BMI 30.43 kg/m2  SpO2 99% Physical Exam  Constitutional: She is oriented to person, place, and time. She appears well-developed and well-nourished.  HENT:  Head: Normocephalic.  Right Ear: Tympanic membrane normal.  Left Ear: Tympanic membrane normal.  Nose: Mucosal edema and rhinorrhea present.  Mouth/Throat: Uvula is midline and mucous membranes are normal. Posterior oropharyngeal erythema (mild) present.  Eyes: Conjunctivae and EOM are normal.  Neck: Trachea normal and normal range of motion. Neck supple. Normal carotid pulses and no JVD present. Carotid bruit is not present.  Cardiovascular: Normal rate and regular rhythm.   Pulmonary/Chest: Effort normal. She has wheezes. She has no rales.  Abdominal: Soft. Bowel sounds are normal. There is no tenderness.  Musculoskeletal: Normal range of motion.  Lymphadenopathy:    She has no cervical adenopathy.  Neurological: She is alert and oriented to person, place, and time. No cranial nerve deficit.  Skin: Skin is warm and dry.  Psychiatric: She has a normal mood and affect. Her behavior is normal.  Nursing note and vitals reviewed.   ED Course  Procedures (including critical care time) Albuterol/Atrovent neb, prednisone, labs, CXR  Labs Review Results for orders placed or performed during the hospital encounter of 06/19/14 (from the past 24 hour(s))  D-dimer, quantitative      Status: None   Collection Time: 06/19/14  9:39 AM  Result Value Ref Range   D-Dimer, Quant 0.34 0.00 - 0.48 ug/mL-FEU  CBC with Differential     Status: None   Collection Time: 06/19/14  9:39 AM  Result Value Ref Range   WBC 5.7 4.0 - 10.5 K/uL   RBC 4.34 3.87 - 5.11 MIL/uL   Hemoglobin 13.2 12.0 - 15.0 g/dL   HCT 39.3 36.0 - 46.0 %   MCV 90.6 78.0 - 100.0 fL   MCH 30.4 26.0 - 34.0 pg   MCHC 33.6 30.0 - 36.0 g/dL   RDW 12.6 11.5 - 15.5 %   Platelets 210 150 - 400 K/uL   Neutrophils Relative % 45 43 - 77 %   Neutro  Abs 2.6 1.7 - 7.7 K/uL   Lymphocytes Relative 45 12 - 46 %   Lymphs Abs 2.6 0.7 - 4.0 K/uL   Monocytes Relative 6 3 - 12 %   Monocytes Absolute 0.4 0.1 - 1.0 K/uL   Eosinophils Relative 3 0 - 5 %   Eosinophils Absolute 0.1 0.0 - 0.7 K/uL   Basophils Relative 1 0 - 1 %   Basophils Absolute 0.0 0.0 - 0.1 K/uL  Basic metabolic panel     Status: Abnormal   Collection Time: 06/19/14  9:39 AM  Result Value Ref Range   Sodium 139 135 - 145 mmol/L   Potassium 4.1 3.5 - 5.1 mmol/L   Chloride 108 96 - 112 mEq/L   CO2 26 19 - 32 mmol/L   Glucose, Bld 144 (H) 70 - 99 mg/dL   BUN 10 6 - 23 mg/dL   Creatinine, Ser 0.73 0.50 - 1.10 mg/dL   Calcium 8.6 8.4 - 10.5 mg/dL   GFR calc non Af Amer >90 >90 mL/min   GFR calc Af Amer >90 >90 mL/min   Anion gap 5 5 - 15   Dr. Roderic Palau in to examine the patient.   Imaging Review Dg Chest 2 View  06/19/2014   CLINICAL DATA:  Acute chest pain.  EXAM: CHEST  2 VIEW  COMPARISON:  September 16, 2004.  FINDINGS: The heart size and mediastinal contours are within normal limits. Both lungs are clear. No pneumothorax or pleural effusion is noted. The visualized skeletal structures are unremarkable.  IMPRESSION: No acute cardiopulmonary abnormality seen.   Electronically Signed   By: Sabino Dick M.D.   On: 06/19/2014 09:02     EKG Interpretation   Date/Time:  Friday June 19 2014 08:06:34 EST Ventricular Rate:  63 PR Interval:  124 QRS  Duration: 77 QT Interval:  367 QTC Calculation: 376 R Axis:   92 Text Interpretation:  Sinus rhythm Borderline right axis deviation  Anteroseptal infarct, old Confirmed by ZAMMIT  MD, JOSEPH [phone]) on  06/19/2014 9:16:32 AM      MDM  54 y.o. female with cough, congestion and left side pain x 4 days. Stable for discharge with normal labs and chest x-ray and EKG. Will treat for bronchitis and she will return if symptoms worsen. I have reviewed this patient's vital signs, nurses notes, appropriate labs and imaging.  I have discussed findings and plan of care with the patient and she voices understanding and agrees with plan.  Tussionex, Z-pak and prednisone.  Final diagnoses:  Chest pain  Bronchitis  Left-sided chest wall pain      Ashley Murrain, NP 06/19/14 [place], MD 06/22/14 1420

## 2014-06-19 NOTE — ED Notes (Signed)
Awaiting inhaler teaching prior to discharge

## 2014-10-02 ENCOUNTER — Other Ambulatory Visit: Payer: Self-pay | Admitting: Family

## 2014-10-13 ENCOUNTER — Encounter: Payer: Self-pay | Admitting: Family

## 2014-10-13 ENCOUNTER — Ambulatory Visit (INDEPENDENT_AMBULATORY_CARE_PROVIDER_SITE_OTHER): Payer: BLUE CROSS/BLUE SHIELD | Admitting: Family

## 2014-10-13 VITALS — BP 157/101 | HR 79 | Temp 98.7°F | Ht 64.5 in | Wt 182.4 lb

## 2014-10-13 DIAGNOSIS — R5383 Other fatigue: Secondary | ICD-10-CM | POA: Diagnosis not present

## 2014-10-13 DIAGNOSIS — L259 Unspecified contact dermatitis, unspecified cause: Secondary | ICD-10-CM | POA: Diagnosis not present

## 2014-10-13 DIAGNOSIS — R110 Nausea: Secondary | ICD-10-CM | POA: Diagnosis not present

## 2014-10-13 LAB — POCT URINALYSIS DIPSTICK
Bilirubin, UA: NEGATIVE
GLUCOSE UA: NEGATIVE
KETONES UA: NEGATIVE
Nitrite, UA: NEGATIVE
Protein, UA: NEGATIVE
Spec Grav, UA: 1.01
Urobilinogen, UA: NEGATIVE
pH, UA: 6.5

## 2014-10-13 LAB — POCT UA - MICROSCOPIC ONLY
CASTS, UR, LPF, POC: NEGATIVE
Crystals, Ur, HPF, POC: NEGATIVE
MUCUS UA: NEGATIVE
Yeast, UA: NEGATIVE

## 2014-10-13 MED ORDER — SULFAMETHOXAZOLE-TRIMETHOPRIM 800-160 MG PO TABS: 1.0000 | ORAL_TABLET | Freq: Two times a day (BID) | ORAL | Status: DC

## 2014-10-13 MED ORDER — METHYLPREDNISOLONE 4 MG PO TBPK: ORAL_TABLET | ORAL | Status: DC

## 2014-10-13 NOTE — Patient Instructions (Signed)

## 2014-10-13 NOTE — Progress Notes (Signed)
   Subjective:    Patient ID: Jamie Mcgee, female    DOB: 1961/03/28, 54 y.o.   MRN: [id number]  HPI Pt presents to office for several symptoms. Pt states for about two weeks she has been nauseated, fatigue, bloated, and rash on her face. PT states the rash started today. Pt denies any fever, SOB, dysuria, or edema.  Pt denies trying any medications for these symptoms.    Review of Systems  Constitutional: Positive for fatigue.  HENT: Negative.   Eyes: Negative.   Respiratory: Negative.  Negative for shortness of breath.   Cardiovascular: Negative.  Negative for chest pain and palpitations.  Gastrointestinal: Positive for nausea. Negative for vomiting.  Endocrine: Negative.   Genitourinary: Negative.   Musculoskeletal: Negative.   Neurological: Negative.  Negative for headaches.  Hematological: Negative.   Psychiatric/Behavioral: Negative.   All other systems reviewed and are negative.      Objective:   Physical Exam  Constitutional: She is oriented to person, place, and time. She appears well-developed and well-nourished. No distress.  HENT:  Head: Normocephalic and atraumatic.  Right Ear: External ear normal.  Left Ear: External ear normal.  Tongue coated with a white film    Eyes: Pupils are equal, round, and reactive to light.  Neck: Normal range of motion. Neck supple. No thyromegaly present.  Cardiovascular: Normal rate, regular rhythm, normal heart sounds and intact distal pulses.   No murmur heard. Pulmonary/Chest: Effort normal and breath sounds normal. No respiratory distress. She has no wheezes.  Abdominal: Soft. Bowel sounds are normal. She exhibits no distension. There is no tenderness.  Musculoskeletal: Normal range of motion. She exhibits no edema or tenderness.  Neurological: She is alert and oriented to person, place, and time. She has normal reflexes. No cranial nerve deficit.  Skin: Skin is warm and dry. Rash (generalized dry erythemeas rash on  bilateral cheeks, nose, and around her mouth) noted. There is erythema.  Psychiatric: She has a normal mood and affect. Her behavior is normal. Judgment and thought content normal.  Vitals reviewed.     BP 157/101 mmHg  Pulse 79  Temp(Src) 98.7 F (37.1 C) (Oral)  Ht 5' 4.5" (1.638 m)  Wt 182 lb 6.4 oz (82.736 kg)  BMI 30.84 kg/m2     Assessment & Plan:  1. Contact dermatitis - sulfamethoxazole-trimethoprim (BACTRIM DS,SEPTRA DS) 800-160 MG per tablet; Take 1 tablet by mouth 2 (two) times daily.  Dispense: 10 tablet; Refill: 0 - methylPREDNISolone (MEDROL DOSEPAK) 4 MG TBPK tablet; Use as directed  Dispense: 21 tablet; Refill: 0  2. Other fatigue - Anemia Profile B - CMP14+EGFR - Thyroid Panel With TSH - POCT UA - Microscopic Only - POCT urinalysis dipstick  3. Nausea without vomiting - Thyroid Panel With TSH - POCT UA - Microscopic Only - POCT urinalysis dipstick  Keep rash clean and dry Do not scratch or pick at Labs pending RTO in 2 weeks to recheck and make sure BP is lower  Evelina Dun, FNP

## 2014-10-14 LAB — ANEMIA PROFILE B
Basophils Absolute: 0 10*3/uL (ref 0.0–0.2)
Basos: 0 %
EOS (ABSOLUTE): 0.2 10*3/uL (ref 0.0–0.4)
EOS: 2 %
Ferritin: 199 ng/mL — ABNORMAL HIGH (ref 15–150)
Folate: >20 ng/mL (ref >3.0)
HEMATOCRIT: 47.7 % — AB (ref 34.0–46.6)
Hemoglobin: 16.2 g/dL — ABNORMAL HIGH (ref 11.1–15.9)
IMMATURE GRANULOCYTES: 0 %
Immature Grans (Abs): 0 10*3/uL (ref 0.0–0.1)
Iron Saturation: 30 % (ref 15–55)
Iron: 87 ug/dL (ref 27–159)
Lymphocytes Absolute: 3.5 10*3/uL — ABNORMAL HIGH (ref 0.7–3.1)
Lymphs: 38 %
MCH: 31 pg (ref 26.6–33.0)
MCHC: 34 g/dL (ref 31.5–35.7)
MCV: 91 fL (ref 79–97)
MONOCYTES: 6 %
Monocytes Absolute: 0.6 10*3/uL (ref 0.1–0.9)
NEUTROS ABS: 5 10*3/uL (ref 1.4–7.0)
Neutrophils: 54 %
PLATELETS: 280 10*3/uL (ref 150–379)
RBC: 5.23 x10E6/uL (ref 3.77–5.28)
RDW: 13.5 % (ref 12.3–15.4)
RETIC CT PCT: 1.1 % (ref 0.6–2.6)
Total Iron Binding Capacity: 291 ug/dL (ref 250–450)
UIBC: 204 ug/dL (ref 131–425)
Vitamin B-12: 438 pg/mL (ref 211–946)
WBC: 9.3 10*3/uL (ref 3.4–10.8)

## 2014-10-14 LAB — CMP14+EGFR
ALT: 44 IU/L — ABNORMAL HIGH (ref 0–32)
AST: 34 IU/L (ref 0–40)
Albumin/Globulin Ratio: 1.5 (ref 1.1–2.5)
Albumin: 4.3 g/dL (ref 3.5–5.5)
Alkaline Phosphatase: 100 IU/L (ref 39–117)
BUN/Creatinine Ratio: 10 (ref 9–23)
BUN: 8 mg/dL (ref 6–24)
Bilirubin Total: 0.3 mg/dL (ref 0.0–1.2)
CALCIUM: 9.5 mg/dL (ref 8.7–10.2)
CHLORIDE: 100 mmol/L (ref 97–108)
CO2: 28 mmol/L (ref 18–29)
Creatinine, Ser: 0.84 mg/dL (ref 0.57–1.00)
GFR calc non Af Amer: 79 mL/min/{1.73_m2} (ref >59)
GFR, EST AFRICAN AMERICAN: 91 mL/min/{1.73_m2} (ref >59)
GLUCOSE: 101 mg/dL — AB (ref 65–99)
Globulin, Total: 2.9 g/dL (ref 1.5–4.5)
Potassium: 4.6 mmol/L (ref 3.5–5.2)
Sodium: 141 mmol/L (ref 134–144)
TOTAL PROTEIN: 7.2 g/dL (ref 6.0–8.5)

## 2014-10-14 LAB — THYROID PANEL WITH TSH
FREE THYROXINE INDEX: 1.9 (ref 1.2–4.9)
T3 Uptake Ratio: 23 % — ABNORMAL LOW (ref 24–39)
T4 TOTAL: 8.3 ug/dL (ref 4.5–12.0)
TSH: 2.18 u[IU]/mL (ref 0.450–4.500)

## 2014-10-14 NOTE — Progress Notes (Signed)
lmtcb

## 2014-10-15 NOTE — Progress Notes (Signed)
Patient aware.

## 2014-10-27 ENCOUNTER — Encounter: Payer: Self-pay | Admitting: Nurse Practitioner

## 2014-10-27 ENCOUNTER — Ambulatory Visit: Payer: BLUE CROSS/BLUE SHIELD | Admitting: Family

## 2014-10-27 ENCOUNTER — Ambulatory Visit (INDEPENDENT_AMBULATORY_CARE_PROVIDER_SITE_OTHER): Payer: BLUE CROSS/BLUE SHIELD | Admitting: Nurse Practitioner

## 2014-10-27 VITALS — BP 141/85 | HR 90 | Temp 97.6°F | Ht 64.0 in | Wt 184.0 lb

## 2014-10-27 DIAGNOSIS — I10 Essential (primary) hypertension: Secondary | ICD-10-CM | POA: Diagnosis not present

## 2014-10-27 DIAGNOSIS — R1011 Right upper quadrant pain: Secondary | ICD-10-CM | POA: Diagnosis not present

## 2014-10-27 DIAGNOSIS — R300 Dysuria: Secondary | ICD-10-CM

## 2014-10-27 LAB — POCT URINALYSIS DIPSTICK
Bilirubin, UA: NEGATIVE
Blood, UA: NEGATIVE
Glucose, UA: NEGATIVE
KETONES UA: NEGATIVE
Leukocytes, UA: NEGATIVE
Nitrite, UA: NEGATIVE
PROTEIN UA: NEGATIVE
Spec Grav, UA: >=1.03
UROBILINOGEN UA: NEGATIVE
pH, UA: 5

## 2014-10-27 LAB — POCT UA - MICROSCOPIC ONLY
Bacteria, U Microscopic: NEGATIVE
CASTS, UR, LPF, POC: NEGATIVE
Crystals, Ur, HPF, POC: NEGATIVE
Mucus, UA: NEGATIVE
RBC, URINE, MICROSCOPIC: NEGATIVE
Yeast, UA: NEGATIVE

## 2014-10-27 MED ORDER — CLONIDINE HCL 0.1 MG PO TABS: 0.1000 mg | ORAL_TABLET | Freq: Two times a day (BID) | ORAL | Status: DC

## 2014-10-27 MED ORDER — CITALOPRAM HYDROBROMIDE 20 MG PO TABS: 20.0000 mg | ORAL_TABLET | Freq: Every day | ORAL | Status: DC

## 2014-10-27 MED ORDER — CARVEDILOL 6.25 MG PO TABS: 6.2500 mg | ORAL_TABLET | Freq: Two times a day (BID) | ORAL | Status: DC

## 2014-10-27 MED ORDER — BENAZEPRIL HCL 40 MG PO TABS: 40.0000 mg | ORAL_TABLET | Freq: Every day | ORAL | Status: DC

## 2014-10-27 NOTE — Patient Instructions (Signed)
Cholecystitis Cholecystitis is an inflammation of your gallbladder. It is usually caused by a buildup of gallstones or sludge (cholelithiasis) in your gallbladder. The gallbladder stores a fluid that helps digest fats (bile). Cholecystitis is serious and needs treatment right away.  CAUSES   Gallstones. Gallstones can block the tube that leads to your gallbladder, causing bile to build up. As bile builds up, the gallbladder becomes inflamed.  Bile duct problems, such as blockage from scarring or kinking.  Tumors. Tumors can stop bile from leaving your gallbladder correctly, causing bile to build up. As bile builds up, the gallbladder becomes inflamed. SYMPTOMS   Nausea.  Vomiting.  Abdominal pain, especially in the upper right area of your abdomen.  Abdominal tenderness or bloating.  Sweating.  Chills.  Fever.  Yellowing of the skin and the whites of the eyes (jaundice). DIAGNOSIS  Your caregiver may order blood tests to look for infection or gallbladder problems. Your caregiver may also order imaging tests, such as an ultrasound or computed tomography (CT) scan. Further tests may include a hepatobiliary iminodiacetic acid (HIDA) scan. This scan allows your caregiver to see your bile move from the liver to the gallbladder and to the small intestine. TREATMENT  A hospital stay is usually necessary to lessen the inflammation of your gallbladder. You may be required to not eat or drink (fast) for a certain amount of time. You may be given medicine to treat pain or an antibiotic medicine to treat an infection. Surgery may be needed to remove your gallbladder (cholecystectomy) once the inflammation has gone down. Surgery may be needed right away if you develop complications such as death of gallbladder tissue (gangrene) or a tear (perforation) of the gallbladder.  Downing care will depend on your treatment. In general:  If you were given antibiotics, take them as  directed. Finish them even if you start to feel better.  Only take over-the-counter or prescription medicines for pain, discomfort, or fever as directed by your caregiver.  Follow a low-fat diet until you see your caregiver again.  Keep all follow-up visits as directed by your caregiver. SEEK IMMEDIATE MEDICAL CARE IF:   Your pain is increasing and not controlled by medicines.  Your pain moves to another part of your abdomen or to your back.  You have a fever.  You have nausea and vomiting. MAKE SURE YOU:  Understand these instructions.  Will watch your condition.  Will get help right away if you are not doing well or get worse. Document Released: 05/22/2005 Document Revised: 08/14/2011 Document Reviewed: 04/07/2011 [facility] Patient Information 2015 [place], [place]. This information is not intended to replace advice given to you by your health care provider. Make sure you discuss any questions you have with your health care provider.

## 2014-10-27 NOTE — Progress Notes (Signed)
Subjective:    Patient ID: Jamie Mcgee, female    DOB: 1961-05-13, 54 y.o.   MRN: [id number]  HPI Patient here today for recheck of: Hypertension- was elevated at last visit 10/13/14- she was not given any new medicines. No c/o headaches. Dysuria- wants urine rechecked. She was given bactrim and she does not feel like it has cleared. abdominal pain- went to urgent care in [place] somewhere and they said that they thought she was having a gallbladder attack.They did not do anything other than an EKG, which was negative. Still having some pain put not like it was.    Review of Systems  Constitutional: Negative.   HENT: Negative.   Respiratory: Negative.   Cardiovascular: Negative.   Gastrointestinal: Positive for abdominal pain. Negative for nausea, vomiting, diarrhea and constipation.  Genitourinary: Positive for dysuria, urgency and frequency.  Neurological: Negative.   Psychiatric/Behavioral: Negative.   All other systems reviewed and are negative.      Objective:   Physical Exam  Constitutional: She is oriented to person, place, and time. She appears well-developed and well-nourished. No distress.  Cardiovascular: Normal rate, regular rhythm and normal heart sounds.   Pulmonary/Chest: Effort normal and breath sounds normal.  Abdominal: Soft. Bowel sounds are normal. She exhibits no distension. There is tenderness (suprapubic and right upper quadrant( Positive Murphy sig )).  Genitourinary:  NO CVA tenderness  Neurological: She is alert and oriented to person, place, and time.  Skin: Skin is warm.  Psychiatric: She has a normal mood and affect. Her behavior is normal. Judgment and thought content normal.   BP 141/85 mmHg  Pulse 90  Temp(Src) 97.6 F (36.4 C) (Oral)  Ht 5\' 4"  (1.626 m)  Wt 184 lb (83.462 kg)  BMI 31.57 kg/m2  Results for orders placed or performed in visit on 10/27/14  POCT urinalysis dipstick  Result Value Ref Range   Color, UA yellow    Clarity, UA clear    Glucose, UA neg    Bilirubin, UA neg    Ketones, UA neg    Spec Grav, UA >=1.030    Blood, UA neg    pH, UA 5.0    Protein, UA neg    Urobilinogen, UA negative    Nitrite, UA neg    Leukocytes, UA Negative   POCT UA - Microscopic Only  Result Value Ref Range   WBC, Ur, HPF, POC occ    RBC, urine, microscopic neg    Bacteria, U Microscopic neg    Mucus, UA neg    Epithelial cells, urine per micros occ    Crystals, Ur, HPF, POC neg    Casts, Ur, LPF, POC neg    Yeast, UA neg            Assessment & Plan:  1. Dysuria Force fluids AZO OTC - POCT urinalysis dipstick - POCT UA - Microscopic Only  2. Essential hypertension Continue current meds Keep check of blood pressure at home  3. RUQ abdominal pain Avoid spicy and fatty foods - US Abdomen Limited RUQ; Future- will call with results    Meds ordered this encounter  Medications  . benazepril (LOTENSIN) 40 MG tablet    Sig: Take 1 tablet (40 mg total) by mouth daily.    Dispense:  90 tablet    Refill:  1    Order Specific Question:  Supervising Provider    Answer:  Chipper Herb [[id number]]  . carvedilol (COREG) 6.25 MG tablet  Sig: Take 1 tablet (6.25 mg total) by mouth 2 (two) times daily with a meal.    Dispense:  180 tablet    Refill:  1    Order Specific Question:  Supervising Provider    Answer:  Chipper Herb [[id number]]  . cloNIDine (CATAPRES) 0.1 MG tablet    Sig: Take 1 tablet (0.1 mg total) by mouth 2 (two) times daily.    Dispense:  60 tablet    Refill:  5    Order Specific Question:  Supervising Provider    Answer:  Chipper Herb [[id number]]  . citalopram (CELEXA) 20 MG tablet    Sig: Take 1 tablet (20 mg total) by mouth daily.    Dispense:  90 tablet    Refill:  1    Order Specific Question:  Supervising Provider    Answer:  Chipper Herb [[id number]]    [place], FNP

## 2014-10-28 ENCOUNTER — Telehealth: Payer: Self-pay | Admitting: Family

## 2014-10-30 ENCOUNTER — Ambulatory Visit (HOSPITAL_COMMUNITY)
Admission: RE | Admit: 2014-10-30 | Discharge: 2014-10-30 | Disposition: A | Discharge status: Home / Self Care | Payer: BLUE CROSS/BLUE SHIELD | Source: Ambulatory Visit | Attending: Nurse Practitioner | Admitting: Nurse Practitioner

## 2014-10-30 DIAGNOSIS — R1011 Right upper quadrant pain: Secondary | ICD-10-CM | POA: Diagnosis not present

## 2014-11-03 ENCOUNTER — Telehealth: Payer: Self-pay | Admitting: Nurse Practitioner

## 2014-11-03 NOTE — Telephone Encounter (Signed)
Possible cirrhosis of liver- other wise normal

## 2014-11-06 ENCOUNTER — Other Ambulatory Visit: Payer: Self-pay | Admitting: Family

## 2014-11-06 ENCOUNTER — Encounter: Payer: Self-pay | Admitting: Family

## 2014-11-06 ENCOUNTER — Inpatient Hospital Stay (HOSPITAL_COMMUNITY): Admission: RE | Admit: 2014-11-06 | Payer: BC Managed Care – PPO | Source: Ambulatory Visit

## 2014-11-06 ENCOUNTER — Other Ambulatory Visit (HOSPITAL_COMMUNITY): Payer: BC Managed Care – PPO

## 2014-11-06 ENCOUNTER — Ambulatory Visit (INDEPENDENT_AMBULATORY_CARE_PROVIDER_SITE_OTHER): Payer: BLUE CROSS/BLUE SHIELD | Admitting: Family

## 2014-11-06 ENCOUNTER — Ambulatory Visit (HOSPITAL_COMMUNITY)
Admission: RE | Admit: 2014-11-06 | Discharge: 2014-11-06 | Disposition: A | Discharge status: Home / Self Care | Payer: BLUE CROSS/BLUE SHIELD | Source: Ambulatory Visit | Attending: Family | Admitting: Family

## 2014-11-06 VITALS — BP 144/96 | HR 67 | Temp 98.7°F | Ht 64.0 in | Wt 183.0 lb

## 2014-11-06 DIAGNOSIS — R1011 Right upper quadrant pain: Secondary | ICD-10-CM | POA: Diagnosis not present

## 2014-11-06 DIAGNOSIS — N950 Postmenopausal bleeding: Secondary | ICD-10-CM | POA: Diagnosis not present

## 2014-11-06 DIAGNOSIS — R1030 Lower abdominal pain, unspecified: Secondary | ICD-10-CM

## 2014-11-06 DIAGNOSIS — N939 Abnormal uterine and vaginal bleeding, unspecified: Secondary | ICD-10-CM

## 2014-11-06 LAB — POCT CBC
Granulocyte percent: 57.5 %G (ref 37–80)
HCT, POC: 47.1 % (ref 37.7–47.9)
HEMOGLOBIN: 15.2 g/dL (ref 12.2–16.2)
LYMPH, POC: 2.5 (ref 0.6–3.4)
MCH: 29.2 pg (ref 27–31.2)
MCHC: 32.2 g/dL (ref 31.8–35.4)
MCV: 90.6 fL (ref 80–97)
MPV: 8.7 fL (ref 0–99.8)
POC GRANULOCYTE: 4.1 (ref 2–6.9)
POC LYMPH %: 35.3 % (ref 10–50)
Platelet Count, POC: 250 10*3/uL (ref 142–424)
RBC: 5.19 M/uL (ref 4.04–5.48)
RDW, POC: 13.2 %
WBC: 7.2 10*3/uL (ref 4.6–10.2)

## 2014-11-06 LAB — POCT URINALYSIS DIPSTICK
Bilirubin, UA: NEGATIVE
Glucose, UA: NEGATIVE
Ketones, UA: NEGATIVE
Nitrite, UA: NEGATIVE
PH UA: 6.5
Protein, UA: NEGATIVE
RBC UA: NEGATIVE
Spec Grav, UA: 1.01
UROBILINOGEN UA: NEGATIVE

## 2014-11-06 LAB — POCT UA - MICROSCOPIC ONLY
BACTERIA, U MICROSCOPIC: NEGATIVE
CASTS, UR, LPF, POC: NEGATIVE
CRYSTALS, UR, HPF, POC: NEGATIVE
Mucus, UA: NEGATIVE
RBC, URINE, MICROSCOPIC: NEGATIVE
YEAST UA: NEGATIVE

## 2014-11-06 NOTE — Patient Instructions (Signed)
Cirrhosis Cirrhosis is a condition of scarring of the liver which is caused when the liver has tried repairing itself following damage. This damage may come from a previous infection such as one of the forms of hepatitis (usually hepatitis C), or the damage may come from being injured by toxins. The main toxin that causes this damage is alcohol. The scarring of the liver from use of alcohol is irreversible. That means the liver cannot return to normal even though alcohol is not used any more. The main danger of hepatitis C infection is that it may cause long-lasting (chronic) liver disease, and this also may lead to cirrhosis. This complication is progressive and irreversible. CAUSES  Prior to available blood tests, hepatitis C could be contracted by blood transfusions. Since testing of blood has improved, this is now unlikely. This infection can also be contracted through intravenous drug use and the sharing of needles. It can also be contracted through sexual relationships. The injury caused by alcohol comes from too much use. It is not a few drinks that poison the liver, but years of misuse. Usually there will be some signs and symptoms early with scarring of the liver that suggest the development of better habits. Alcohol should never be used while using acetaminophen. A small dose of both taken together may cause irreversible damage to the liver. HOME CARE INSTRUCTIONS  There is no specific treatment for cirrhosis. However, there are things you can do to avoid making the condition worse.  Rest as needed.  Eat a well-balanced diet. Your caregiver can help you with suggestions.  Vitamin supplements including vitamins A, K, D, and thiamine can help.  A low-salt diet, water restriction, or diuretic medicine may be needed to reduce fluid retention.  Avoid alcohol. This can be extremely toxic if combined with acetaminophen.  Avoid drugs which are toxic to the liver. Some of these include isoniazid,  methyldopa, acetaminophen, anabolic steroids (muscle-building drugs), erythromycin, and oral contraceptives (birth control pills). Check with your caregiver to make sure medicines you are presently taking will not be harmful.  Periodic blood tests may be required. Follow your caregiver's advice regarding the timing of these.  Milk thistle is an herbal remedy which does protect the liver against toxins. However, it will not help once the liver has been scarred. SEEK MEDICAL CARE IF:  You have increasing fatigue or weakness.  You develop swelling of the hands, feet, legs, or face.  You vomit bright red blood, or a coffee ground appearing material.  You have blood in your stools, or the stools turn black and tarry.  You have a fever.  You develop loss of appetite, or have nausea and vomiting.  You develop jaundice.  You develop easy bruising or bleeding.  You have worsening of any of the problems you are concerned about. Document Released: 05/22/2005 Document Revised: 08/14/2011 Document Reviewed: 01/08/2008 [facility] Patient Information 2015 [place], [place]. This information is not intended to replace advice given to you by your health care provider. Make sure you discuss any questions you have with your health care provider. Abdominal Pain Many things can cause abdominal pain. Usually, abdominal pain is not caused by a disease and will improve without treatment. It can often be observed and treated at home. Your health care provider will do a physical exam and possibly order blood tests and X-rays to help determine the seriousness of your pain. However, in many cases, more time must pass before a clear cause of the pain can be  found. Before that point, your health care provider may not know if you need more testing or further treatment. HOME CARE INSTRUCTIONS  Monitor your abdominal pain for any changes. The following actions may help to alleviate any discomfort you are  experiencing:  Only take over-the-counter or prescription medicines as directed by your health care provider.  Do not take laxatives unless directed to do so by your health care provider.  Try a clear liquid diet (broth, tea, or water) as directed by your health care provider. Slowly move to a bland diet as tolerated. SEEK MEDICAL CARE IF:  You have unexplained abdominal pain.  You have abdominal pain associated with nausea or diarrhea.  You have pain when you urinate or have a bowel movement.  You experience abdominal pain that wakes you in the night.  You have abdominal pain that is worsened or improved by eating food.  You have abdominal pain that is worsened with eating fatty foods.  You have a fever. SEEK IMMEDIATE MEDICAL CARE IF:   Your pain does not go away within 2 hours.  You keep throwing up (vomiting).  Your pain is felt only in portions of the abdomen, such as the right side or the left lower portion of the abdomen.  You pass bloody or black tarry stools. MAKE SURE YOU:  Understand these instructions.   Will watch your condition.   Will get help right away if you are not doing well or get worse.  Document Released: 03/01/2005 Document Revised: 05/27/2013 Document Reviewed: 01/29/2013 [facility] Patient Information 2015 [place], [place]. This information is not intended to replace advice given to you by your health care provider. Make sure you discuss any questions you have with your health care provider.

## 2014-11-06 NOTE — Progress Notes (Signed)
   Subjective:    Patient ID: Jamie Mcgee, female    DOB: [birth year], 54 y.o.   MRN: [id number]  Pt presents to the office today to discuss ultrasound results and follow up on abdomen pain RUQ and suprapubic pain. PT states she is having scant amount of spotting. Pt states it has been 2 years since her last period.  Abdominal Pain This is a recurrent problem. The current episode started 1 to 4 weeks ago. The onset quality is gradual. The problem occurs constantly. The problem has been unchanged. The pain is located in the RUQ and suprapubic region. The pain is at a severity of 5/10. The pain is moderate. The quality of the pain is dull. The abdominal pain radiates to the back. Associated symptoms include hematuria. Pertinent negatives include no constipation, diarrhea, dysuria, frequency, headaches, hematochezia, nausea or vomiting. Associated symptoms comments: Spotting . Nothing aggravates the pain. The pain is relieved by nothing. She has tried acetaminophen for the symptoms. The treatment provided mild relief.       Review of Systems  Constitutional: Negative.   HENT: Negative.   Eyes: Negative.   Respiratory: Negative.  Negative for shortness of breath.   Cardiovascular: Negative.  Negative for palpitations.  Gastrointestinal: Positive for abdominal pain. Negative for nausea, vomiting, diarrhea, constipation and hematochezia.  Endocrine: Negative.   Genitourinary: Positive for hematuria. Negative for dysuria and frequency.  Musculoskeletal: Negative.   Neurological: Negative.  Negative for headaches.  Hematological: Negative.   Psychiatric/Behavioral: Negative.   All other systems reviewed and are negative.      Objective:   Physical Exam  Constitutional: She is oriented to person, place, and time. She appears well-developed and well-nourished. No distress.  HENT:  Head: Normocephalic and atraumatic.  Right Ear: External ear normal.  Left Ear: External ear normal.  Nose:  Nose normal.  Mouth/Throat: Oropharynx is clear and moist.  Eyes: Pupils are equal, round, and reactive to light.  Neck: Normal range of motion. Neck supple. No thyromegaly present.  Cardiovascular: Normal rate, regular rhythm, normal heart sounds and intact distal pulses.   No murmur heard. Pulmonary/Chest: Effort normal and breath sounds normal. No respiratory distress. She has no wheezes.  Abdominal: Soft. Bowel sounds are normal. She exhibits no distension. There is tenderness (mild RUQ and generalized suprapubic tenderness).  Musculoskeletal: Normal range of motion. She exhibits no edema or tenderness.  Neurological: She is alert and oriented to person, place, and time. She has normal reflexes. No cranial nerve deficit.  Skin: Skin is warm and dry.  Psychiatric: She has a normal mood and affect. Her behavior is normal. Judgment and thought content normal.  Vitals reviewed.   BP 144/96 mmHg  Pulse 67  Temp(Src) 98.7 F (37.1 C) (Oral)  Ht 5\' 4"  (1.626 m)  Wt 183 lb (83.008 kg)  BMI 31.40 kg/m2       Assessment & Plan:  1. Suprapubic abdominal pain, unspecified laterality - US Transvaginal Non-OB; Future - POCT CBC - POCT UA - Microscopic Only - POCT urinalysis dipstick  2. Post-menopausal bleeding - US Transvaginal Non-OB; Future  3. RUQ abdominal pain - POCT CBC - POCT UA - Microscopic Only - POCT urinalysis dipstick  Review findings on Ultrasound pt- Discussed cirrhosis Avoid alcohol and tylenol  Transvaginal ultrasound pending- If negative will do CT scan RTO in next week  Evelina Dun, FNP

## 2014-11-12 ENCOUNTER — Other Ambulatory Visit: Payer: Self-pay | Admitting: Family

## 2014-11-12 DIAGNOSIS — R1030 Lower abdominal pain, unspecified: Secondary | ICD-10-CM

## 2014-11-13 ENCOUNTER — Ambulatory Visit (HOSPITAL_COMMUNITY): Admission: RE | Admit: 2014-11-13 | Payer: BLUE CROSS/BLUE SHIELD | Source: Ambulatory Visit

## 2014-11-13 ENCOUNTER — Ambulatory Visit (HOSPITAL_COMMUNITY)
Admission: RE | Admit: 2014-11-13 | Discharge: 2014-11-13 | Disposition: A | Discharge status: Home / Self Care | Payer: BLUE CROSS/BLUE SHIELD | Source: Ambulatory Visit | Attending: Family | Admitting: Family

## 2014-11-13 DIAGNOSIS — R1030 Lower abdominal pain, unspecified: Secondary | ICD-10-CM | POA: Insufficient documentation

## 2014-11-13 MED ORDER — IOHEXOL 300 MG/ML  SOLN
100.0000 mL | Freq: Once | INTRAMUSCULAR | Status: AC | PRN
  Administered 2014-11-13: 100 mL via INTRAVENOUS

## 2014-11-16 ENCOUNTER — Telehealth: Payer: Self-pay | Admitting: Family

## 2014-11-17 NOTE — Telephone Encounter (Signed)
PT called and notified of CT scan

## 2014-11-30 ENCOUNTER — Other Ambulatory Visit: Payer: Self-pay | Admitting: Family

## 2014-11-30 DIAGNOSIS — Z1231 Encounter for screening mammogram for malignant neoplasm of breast: Secondary | ICD-10-CM

## 2014-12-09 ENCOUNTER — Encounter: Payer: Self-pay | Admitting: Gastroenterology

## 2015-01-01 ENCOUNTER — Ambulatory Visit (HOSPITAL_COMMUNITY)
Admission: RE | Admit: 2015-01-01 | Discharge: 2015-01-01 | Disposition: A | Discharge status: Home / Self Care | Payer: BLUE CROSS/BLUE SHIELD | Source: Ambulatory Visit | Attending: Family | Admitting: Family

## 2015-01-01 DIAGNOSIS — Z1231 Encounter for screening mammogram for malignant neoplasm of breast: Secondary | ICD-10-CM | POA: Diagnosis present

## 2015-01-22 ENCOUNTER — Ambulatory Visit: Payer: BLUE CROSS/BLUE SHIELD | Admitting: Family

## 2015-01-29 ENCOUNTER — Encounter: Payer: Self-pay | Admitting: Family

## 2015-01-29 ENCOUNTER — Telehealth: Payer: Self-pay | Admitting: Family

## 2015-01-29 ENCOUNTER — Ambulatory Visit (INDEPENDENT_AMBULATORY_CARE_PROVIDER_SITE_OTHER): Payer: BLUE CROSS/BLUE SHIELD | Admitting: Family

## 2015-01-29 VITALS — BP 107/73 | HR 75 | Temp 98.2°F | Ht 64.0 in | Wt 184.0 lb

## 2015-01-29 DIAGNOSIS — I10 Essential (primary) hypertension: Secondary | ICD-10-CM

## 2015-01-29 DIAGNOSIS — N952 Postmenopausal atrophic vaginitis: Secondary | ICD-10-CM

## 2015-01-29 MED ORDER — ESTROGENS, CONJUGATED 0.625 MG/GM VA CREA: TOPICAL_CREAM | VAGINAL | Status: DC

## 2015-01-29 NOTE — Progress Notes (Signed)
   Subjective:    Patient ID: Jamie Mcgee, female    DOB: [birth year], 54 y.o.   MRN: [id number]  Pt presents to the office today to recheck HTN. Pt's BP is at goal. Pt states she is still having intermittent vaginal bleeding. Pt states she notices 3-4 times a month when she wipes. Pt states this has been going on for 4 months and has had US Transvaginal that was negative for any masses. PT states she has a strong family history of breast cancer ( Her mother, maternal grandmother, and maternal aunt).PT denies any personal history of breast, cervical, or endometrial cancer.   Hypertension This is a chronic problem. The current episode started more than 1 year ago. The problem has been resolved since onset. The problem is controlled. Pertinent negatives include no anxiety, headaches, palpitations, peripheral edema or shortness of breath. Risk factors for coronary artery disease include obesity, post-menopausal state, sedentary lifestyle, smoking/tobacco exposure and family history. Past treatments include beta blockers and ACE inhibitors. The current treatment provides moderate improvement. There is no history of kidney disease, CAD/MI, CVA, heart failure or a thyroid problem. There is no history of sleep apnea.      Review of Systems  Constitutional: Negative.   HENT: Negative.   Eyes: Negative.   Respiratory: Negative.  Negative for shortness of breath.   Cardiovascular: Negative.  Negative for palpitations.  Gastrointestinal: Negative.   Endocrine: Negative.   Genitourinary: Negative.   Musculoskeletal: Negative.   Neurological: Negative.  Negative for headaches.  Hematological: Negative.   Psychiatric/Behavioral: Negative.   All other systems reviewed and are negative.      Objective:   Physical Exam  Constitutional: She is oriented to person, place, and time. She appears well-developed and well-nourished. No distress.  HENT:  Head: Normocephalic and atraumatic.  Right Ear:  External ear normal.  Left Ear: External ear normal.  Nose: Nose normal.  Mouth/Throat: Oropharynx is clear and moist.  Eyes: Pupils are equal, round, and reactive to light.  Neck: Normal range of motion. Neck supple. No thyromegaly present.  Cardiovascular: Normal rate, regular rhythm, normal heart sounds and intact distal pulses.   No murmur heard. Pulmonary/Chest: Effort normal and breath sounds normal. No respiratory distress. She has no wheezes.  Abdominal: Soft. Bowel sounds are normal. She exhibits no distension. There is no tenderness.  Musculoskeletal: Normal range of motion. She exhibits no edema or tenderness.  Neurological: She is alert and oriented to person, place, and time. She has normal reflexes. No cranial nerve deficit.  Skin: Skin is warm and dry.  Psychiatric: She has a normal mood and affect. Her behavior is normal. Judgment and thought content normal.  Vitals reviewed.   BP 107/73 mmHg  Pulse 75  Temp(Src) 98.2 F (36.8 C) (Oral)  Ht $R'5\' 4"'UU$  (1.626 m)  Wt 184 lb (83.462 kg)  BMI 31.57 kg/m2  LMP 03/25/2011       Assessment & Plan:  1. Essential hypertension, benign --Daily blood pressure log given with instructions on how to fill out and told to bring to next visit -Dash diet information given -Exercise encouraged - Stress Management  -Continue current meds -RTO in 3 months for chronic follow up - BMP8+EGFR  2. Vaginal atrophy -Risks and adverse effects discussed -Continue with regularly paps and mammograms - conjugated estrogens (PREMARIN) vaginal cream; 1 Application Two or three times a week  Dispense: 42.5 g; Refill: 12 - BMP8+EGFR   Evelina Dun, FNP

## 2015-01-29 NOTE — Patient Instructions (Signed)
Postmenopausal Bleeding Postmenopausal bleeding is any bleeding a woman has after she has entered into menopause. Menopause is the end of a woman's fertile years. After menopause, a woman no longer ovulates or has menstrual periods.  Postmenopausal bleeding can be caused by various things. Any type of postmenopausal bleeding, even if it appears to be a typical menstrual period, is concerning. This should be evaluated by your health care provider. Any treatment will depend on the cause of the bleeding. HOME CARE INSTRUCTIONS Monitor your condition for any changes. The following actions may help to alleviate any discomfort you are experiencing:  Avoid the use of tampons and douches as directed by your health care provider.  Change your pads frequently.  Get regular pelvic exams and Pap tests.  Keep all follow-up appointments for diagnostic tests as directed by your health care provider. SEEK MEDICAL CARE IF:   Your bleeding lasts more than 1 week.  You have abdominal pain.  You have bleeding with sexual intercourse. SEEK IMMEDIATE MEDICAL CARE IF:   You have a fever, chills, headache, dizziness, muscle aches, and bleeding.  You have severe pain with bleeding.  You are passing blood clots.  You have bleeding and need more than 1 pad an hour.  You feel faint. MAKE SURE YOU:  Understand these instructions.  Will watch your condition.  Will get help right away if you are not doing well or get worse. Document Released: 08/30/2005 Document Revised: 03/12/2013 Document Reviewed: 12/19/2012 [facility] Patient Information 2015 [place], [place]. This information is not intended to replace advice given to you by your health care provider. Make sure you discuss any questions you have with your health care provider. Hypertension Hypertension, commonly called high blood pressure, is when the force of blood pumping through your arteries is too strong. Your arteries are the blood vessels that  carry blood from your heart throughout your body. A blood pressure reading consists of a higher number over a lower number, such as 110/72. The higher number (systolic) is the pressure inside your arteries when your heart pumps. The lower number (diastolic) is the pressure inside your arteries when your heart relaxes. Ideally you want your blood pressure below 120/80. Hypertension forces your heart to work harder to pump blood. Your arteries may become narrow or stiff. Having hypertension puts you at risk for heart disease, stroke, and other problems.  RISK FACTORS Some risk factors for high blood pressure are controllable. Others are not.  Risk factors you cannot control include:   Race. You may be at higher risk if you are African American.  Age. Risk increases with age.  Gender. Men are at higher risk than women before age 17 years. After age 21, women are at higher risk than men. Risk factors you can control include:  Not getting enough exercise or physical activity.  Being overweight.  Getting too much fat, sugar, calories, or salt in your diet.  Drinking too much alcohol. SIGNS AND SYMPTOMS Hypertension does not usually cause signs or symptoms. Extremely high blood pressure (hypertensive crisis) may cause headache, anxiety, shortness of breath, and nosebleed. DIAGNOSIS  To check if you have hypertension, your health care provider will measure your blood pressure while you are seated, with your arm held at the level of your heart. It should be measured at least twice using the same arm. Certain conditions can cause a difference in blood pressure between your right and left arms. A blood pressure reading that is higher than normal on one occasion  does not mean that you need treatment. If one blood pressure reading is high, ask your health care provider about having it checked again. TREATMENT  Treating high blood pressure includes making lifestyle changes and possibly taking medicine.  Living a healthy lifestyle can help lower high blood pressure. You may need to change some of your habits. Lifestyle changes may include:  Following the DASH diet. This diet is high in fruits, vegetables, and whole grains. It is low in salt, red meat, and added sugars.  Getting at least 2 hours of brisk physical activity every week.  Losing weight if necessary.  Not smoking.  Limiting alcoholic beverages.  Learning ways to reduce stress. If lifestyle changes are not enough to get your blood pressure under control, your health care provider may prescribe medicine. You may need to take more than one. Work closely with your health care provider to understand the risks and benefits. HOME CARE INSTRUCTIONS  Have your blood pressure rechecked as directed by your health care provider.   Take medicines only as directed by your health care provider. Follow the directions carefully. Blood pressure medicines must be taken as prescribed. The medicine does not work as well when you skip doses. Skipping doses also puts you at risk for problems.   Do not smoke.   Monitor your blood pressure at home as directed by your health care provider. SEEK MEDICAL CARE IF:   You think you are having a reaction to medicines taken.  You have recurrent headaches or feel dizzy.  You have swelling in your ankles.  You have trouble with your vision. SEEK IMMEDIATE MEDICAL CARE IF:  You develop a severe headache or confusion.  You have unusual weakness, numbness, or feel faint.  You have severe chest or abdominal pain.  You vomit repeatedly.  You have trouble breathing. MAKE SURE YOU:   Understand these instructions.  Will watch your condition.  Will get help right away if you are not doing well or get worse. Document Released: 05/22/2005 Document Revised: 10/06/2013 Document Reviewed: 03/14/2013 [facility] Patient Information 2015 [place], [place]. This information is not intended to replace  advice given to you by your health care provider. Make sure you discuss any questions you have with your health care provider.

## 2015-01-30 LAB — BMP8+EGFR
BUN/Creatinine Ratio: 7 — ABNORMAL LOW (ref 9–23)
BUN: 6 mg/dL (ref 6–24)
CO2: 27 mmol/L (ref 18–29)
Calcium: 9.2 mg/dL (ref 8.7–10.2)
Chloride: 103 mmol/L (ref 97–108)
Creatinine, Ser: 0.85 mg/dL (ref 0.57–1.00)
GFR calc non Af Amer: 78 mL/min/{1.73_m2} (ref >59)
GFR, EST AFRICAN AMERICAN: 90 mL/min/{1.73_m2} (ref >59)
Glucose: 93 mg/dL (ref 65–99)
Potassium: 4.1 mmol/L (ref 3.5–5.2)
SODIUM: 144 mmol/L (ref 134–144)

## 2015-02-02 MED ORDER — OSPEMIFENE 60 MG PO TABS: 1.0000 | ORAL_TABLET | Freq: Every day | ORAL | Status: DC

## 2015-02-02 NOTE — Telephone Encounter (Signed)
New rx of Osphena oral medication to take everyday. This is non-hormonal tablet. Pt still needs to get pap regularly

## 2015-02-02 NOTE — Telephone Encounter (Signed)
Pt aware of new medication sent to pharmacy

## 2015-02-16 ENCOUNTER — Telehealth: Payer: Self-pay | Admitting: Family

## 2015-02-16 MED ORDER — CONJ ESTROG-MEDROXYPROGEST ACE 0.3-1.5 MG PO TABS
1.0000 | ORAL_TABLET | Freq: Every day | ORAL | Status: DC
Start: 2015-02-16 — End: 2015-06-18

## 2015-02-16 NOTE — Telephone Encounter (Signed)
Patient states that since she has started the HRT pills the bleeding has gotten worse, hot flashes have gotten worse and she is cramping like she did when she had a period.

## 2015-02-16 NOTE — Telephone Encounter (Signed)
Pt to stop Ospemifene. New rx of estrogen and progestin sent to pharmacy. Take 1 tab daily

## 2015-02-16 NOTE — Telephone Encounter (Signed)
Patient aware and verbalizes understanding. 

## 2015-03-08 ENCOUNTER — Observation Stay (HOSPITAL_COMMUNITY)
Admission: AD | Admit: 2015-03-08 | Discharge: 2015-03-09 | Disposition: A | Discharge status: Home / Self Care | Payer: BLUE CROSS/BLUE SHIELD | Source: Other Acute Inpatient Hospital | Attending: Internal Medicine | Admitting: Internal Medicine

## 2015-03-08 ENCOUNTER — Encounter (HOSPITAL_COMMUNITY): Payer: Self-pay | Admitting: Internal Medicine

## 2015-03-08 DIAGNOSIS — R0789 Other chest pain: Secondary | ICD-10-CM | POA: Diagnosis not present

## 2015-03-08 DIAGNOSIS — Z79899 Other long term (current) drug therapy: Secondary | ICD-10-CM | POA: Insufficient documentation

## 2015-03-08 DIAGNOSIS — I10 Essential (primary) hypertension: Secondary | ICD-10-CM | POA: Diagnosis not present

## 2015-03-08 DIAGNOSIS — E785 Hyperlipidemia, unspecified: Secondary | ICD-10-CM | POA: Diagnosis not present

## 2015-03-08 DIAGNOSIS — F329 Major depressive disorder, single episode, unspecified: Secondary | ICD-10-CM | POA: Diagnosis not present

## 2015-03-08 DIAGNOSIS — Z720 Tobacco use: Secondary | ICD-10-CM | POA: Diagnosis not present

## 2015-03-08 DIAGNOSIS — R079 Chest pain, unspecified: Secondary | ICD-10-CM | POA: Diagnosis present

## 2015-03-08 DIAGNOSIS — F17210 Nicotine dependence, cigarettes, uncomplicated: Secondary | ICD-10-CM | POA: Diagnosis present

## 2015-03-08 DIAGNOSIS — I2510 Atherosclerotic heart disease of native coronary artery without angina pectoris: Secondary | ICD-10-CM | POA: Insufficient documentation

## 2015-03-08 LAB — CBC
HEMATOCRIT: 44.6 % (ref 36.0–46.0)
HEMOGLOBIN: 15.4 g/dL — AB (ref 12.0–15.0)
MCH: 31.6 pg (ref 26.0–34.0)
MCHC: 34.5 g/dL (ref 30.0–36.0)
MCV: 91.6 fL (ref 78.0–100.0)
PLATELETS: 239 10*3/uL (ref 150–400)
RBC: 4.87 MIL/uL (ref 3.87–5.11)
RDW: 12.9 % (ref 11.5–15.5)
WBC: 10 10*3/uL (ref 4.0–10.5)

## 2015-03-08 LAB — CREATININE, SERUM
Creatinine, Ser: 0.98 mg/dL (ref 0.44–1.00)
GFR calc non Af Amer: >60 mL/min (ref >60)

## 2015-03-08 MED ORDER — MORPHINE SULFATE (PF) 2 MG/ML IV SOLN: 2.0000 mg | INTRAVENOUS | Status: DC | PRN

## 2015-03-08 MED ORDER — ONDANSETRON HCL 4 MG/2ML IJ SOLN
4.0000 mg | Freq: Four times a day (QID) | INTRAMUSCULAR | Status: DC | PRN
Start: 2015-03-08 — End: 2015-03-09

## 2015-03-08 MED ORDER — ENOXAPARIN SODIUM 40 MG/0.4ML ~~LOC~~ SOLN
40.0000 mg | SUBCUTANEOUS | Status: DC
  Administered 2015-03-08 – 2015-03-09 (×2): 40 mg via SUBCUTANEOUS
  Filled 2015-03-08 (×2): qty 0.4

## 2015-03-08 MED ORDER — CITALOPRAM HYDROBROMIDE 20 MG PO TABS
20.0000 mg | ORAL_TABLET | Freq: Every day | ORAL | Status: DC
  Administered 2015-03-08 – 2015-03-09 (×2): 20 mg via ORAL
  Filled 2015-03-08 (×2): qty 1

## 2015-03-08 MED ORDER — SODIUM CHLORIDE 0.9 % IJ SOLN
3.0000 mL | Freq: Two times a day (BID) | INTRAMUSCULAR | Status: DC
  Administered 2015-03-08 – 2015-03-09 (×2): 3 mL via INTRAVENOUS

## 2015-03-08 MED ORDER — ASPIRIN EC 81 MG PO TBEC
81.0000 mg | DELAYED_RELEASE_TABLET | Freq: Every day | ORAL | Status: DC
  Administered 2015-03-08 – 2015-03-09 (×2): 81 mg via ORAL
  Filled 2015-03-08 (×2): qty 1

## 2015-03-08 MED ORDER — SODIUM CHLORIDE 0.9 % IJ SOLN: 3.0000 mL | INTRAMUSCULAR | Status: DC | PRN

## 2015-03-08 MED ORDER — SODIUM CHLORIDE 0.9 % IJ SOLN
3.0000 mL | Freq: Two times a day (BID) | INTRAMUSCULAR | Status: DC
  Administered 2015-03-09: 3 mL via INTRAVENOUS

## 2015-03-08 MED ORDER — PANTOPRAZOLE SODIUM 40 MG PO TBEC
40.0000 mg | DELAYED_RELEASE_TABLET | Freq: Every day | ORAL | Status: DC
  Administered 2015-03-09: 40 mg via ORAL
  Filled 2015-03-08: qty 1

## 2015-03-08 MED ORDER — CLONIDINE HCL 0.1 MG PO TABS
0.1000 mg | ORAL_TABLET | Freq: Two times a day (BID) | ORAL | Status: DC
  Administered 2015-03-08 – 2015-03-09 (×2): 0.1 mg via ORAL
  Filled 2015-03-08 (×3): qty 1

## 2015-03-08 MED ORDER — ONDANSETRON HCL 4 MG PO TABS
4.0000 mg | ORAL_TABLET | Freq: Four times a day (QID) | ORAL | Status: DC | PRN
Start: 2015-03-08 — End: 2015-03-09

## 2015-03-08 MED ORDER — ALUM & MAG HYDROXIDE-SIMETH 200-200-20 MG/5ML PO SUSP
30.0000 mL | Freq: Four times a day (QID) | ORAL | Status: DC | PRN
  Administered 2015-03-08: 30 mL via ORAL
  Filled 2015-03-08: qty 30

## 2015-03-08 MED ORDER — BENAZEPRIL HCL 10 MG PO TABS
40.0000 mg | ORAL_TABLET | Freq: Every day | ORAL | Status: DC
  Administered 2015-03-09: 40 mg via ORAL
  Filled 2015-03-08 (×2): qty 4

## 2015-03-08 MED ORDER — SODIUM CHLORIDE 0.9 % IV SOLN: 250.0000 mL | INTRAVENOUS | Status: DC | PRN

## 2015-03-08 MED ORDER — CARVEDILOL 3.125 MG PO TABS
6.2500 mg | ORAL_TABLET | Freq: Two times a day (BID) | ORAL | Status: DC
  Administered 2015-03-08 – 2015-03-09 (×2): 6.25 mg via ORAL
  Filled 2015-03-08 (×2): qty 2

## 2015-03-08 NOTE — H&P (Signed)
[facility] History and Physical  Jamie Mcgee:[id number] DOB: 07-Jan-1961    PCP:   Evelina Dun, FNP   Chief Complaint:  chest pain.  HPI: Jamie Mcgee is an 54 y.o. female with hx of depression on Celexa, HLD but has lipitor allergy, tobacco abuse, and family hx of CAD (father died at age 39 of a "heart attack"), obesity, HTN on Coreg, ACE I, and Clonidine patch, sent in from OSH for chest pain.  She experienced transient CP lasting less than 15 minutes, non exertional, and accompanied by only nausea, but no SOB or diaphoresis.  She has GERD symptoms, but denied melana, BRBPR or hematemesis.  Record showed 2 negative troponins, and unremarkable EKG, along with clear CXR.  She also had a RUQ [place] with normal GB and negative murphy sign.  She was accepted in transfer by Dr Wendee Beavers at [facility], but would like to come to [facility].  Currently she is asymptomatic.   Rewiew of Systems:  Constitutional: Negative for malaise, fever and chills. No significant weight loss or weight gain Eyes: Negative for eye pain, redness and discharge, diplopia, visual changes, or flashes of light. ENMT: Negative for ear pain, hoarseness, nasal congestion, sinus pressure and sore throat. No headaches; tinnitus, drooling, or problem swallowing. Cardiovascular: Negative for chest pain, palpitations, diaphoresis, dyspnea and peripheral edema. ; No orthopnea, PND Respiratory: Negative for cough, hemoptysis, wheezing and stridor. No pleuritic chestpain. Gastrointestinal: Negative for nausea, vomiting, diarrhea, constipation, abdominal pain, melena, blood in stool, hematemesis, jaundice and rectal bleeding.    Genitourinary: Negative for frequency, dysuria, incontinence,flank pain and hematuria; Musculoskeletal: Negative for back pain and neck pain. Negative for swelling and trauma.;  Skin: . Negative for pruritus, rash, abrasions, bruising and skin lesion.; ulcerations Neuro: Negative for headache, lightheadedness  and neck stiffness. Negative for weakness, altered level of consciousness , altered mental status, extremity weakness, burning feet, involuntary movement, seizure and syncope.  Psych: negative for anxiety, depression, insomnia, tearfulness, panic attacks, hallucinations, paranoia, suicidal or homicidal ideation    Past Medical History  Diagnosis Date  . HTN (hypertension)   . Depression     hx of  . Fibromuscular dysplasia of renal artery [facility])     Past Surgical History  Procedure Laterality Date  . Cataract extraction  08-2010    left   . Tubal ligation  1984    [facility]  . Renal artery angioplasty  2006    right  . Cataract extraction w/phaco  06/01/2011    Procedure: CATARACT EXTRACTION PHACO AND INTRAOCULAR LENS PLACEMENT (IOC);  Surgeon: Tonny Branch;  Location: [facility];  Service: Ophthalmology;  Laterality: Right;  CDE:9.62  . Appendectomy  1993    Medications:  HOME MEDS: Prior to Admission medications   Medication Sig Start Date End Date Taking? Authorizing Provider  benazepril (LOTENSIN) 40 MG tablet Take 1 tablet (40 mg total) by mouth daily. 10/27/14   Mary-Margaret Hassell Done, FNP  carvedilol (COREG) 6.25 MG tablet Take 1 tablet (6.25 mg total) by mouth 2 (two) times daily with a meal. 10/27/14   Mary-Margaret Hassell Done, FNP  citalopram (CELEXA) 20 MG tablet Take 1 tablet (20 mg total) by mouth daily. 10/27/14   Mary-Margaret Hassell Done, FNP  cloNIDine (CATAPRES) 0.1 MG tablet Take 1 tablet (0.1 mg total) by mouth 2 (two) times daily. 10/27/14   Mary-Margaret Hassell Done, FNP  estrogen, conjugated,-medroxyprogesterone (PREMPRO) 0.3-1.5 MG per tablet Take 1 tablet by mouth daily. 02/16/15   Sharion Balloon, [place]  1000 MG CAPS Take 1,000 mg by mouth daily.    Historical Provider, MD  Multiple Vitamin (MULITIVITAMIN WITH MINERALS) TABS Take 1 tablet by mouth daily.      Historical Provider, MD     Allergies:  Allergies  Allergen Reactions  . Lipitor [Atorvastatin] Other (See Comments)     Muscle aches and nausea    Social History:   reports that she has been smoking Cigarettes.  She has a 30 pack-year smoking history. She does not have any smokeless tobacco history on file. She reports that she does not drink alcohol or use illicit drugs.  Family History: Family History  Problem Relation Age of Onset  . Coronary artery disease    . Anesthesia problems Neg Hx   . Hypotension Neg Hx   . Malignant hyperthermia Neg Hx   . Pseudochol deficiency Neg Hx      Physical Exam: Filed Vitals:   03/08/15 1326  BP: 133/79  Pulse: 53  Temp: 98 F (36.7 C)  Resp: 16  Height: 5\' 4"  (1.626 m)  Weight: 84.732 kg (186 lb 12.8 oz)  SpO2: 98%   Blood pressure 133/79, pulse 53, temperature 98 F (36.7 C), resp. rate 16, height 5\' 4"  (1.626 m), weight 84.732 kg (186 lb 12.8 oz), last menstrual period 03/25/2011, SpO2 98 %.  GEN:  Pleasant patient lying in the stretcher in no acute distress; cooperative with exam. PSYCH:  alert and oriented x4; does not appear anxious or depressed; affect is appropriate. HEENT: Mucous membranes pink and anicteric; PERRLA; EOM intact; no cervical lymphadenopathy nor thyromegaly or carotid bruit; no JVD; There were no stridor. Neck is very supple. Breasts:: Not examined CHEST WALL: No tenderness CHEST: Normal respiration, clear to auscultation bilaterally.  HEART: Regular rate and rhythm.  There are no murmur, rub, or gallops.   BACK: No kyphosis or scoliosis; no CVA tenderness ABDOMEN: soft and non-tender; no masses, no organomegaly, normal abdominal bowel sounds; no pannus; no intertriginous candida. There is no rebound and no distention. Rectal Exam: Not done EXTREMITIES: No bone or joint deformity; age-appropriate arthropathy of the hands and knees; no edema; no ulcerations.  There is no calf tenderness. Genitalia: not examined PULSES: 2+ and symmetric SKIN: Normal hydration no rash or ulceration CNS: Cranial nerves 2-12 grossly intact no  focal lateralizing neurologic deficit.  Speech is fluent; uvula elevated with phonation, facial symmetry and tongue midline. DTR are normal bilaterally, cerebella exam is intact, barbinski is negative and strengths are equaled bilaterally.  No sensory loss.   Labs on Admission: Outside labs reviewed.   EKG: Independently reviewed. NSR with no acute ST T Changes.   Assessment/Plan Present on Admission:  . Essential hypertension, benign . Chest pain, atypical . Tobacco abuse . Atypical chest pain  PLAN: Will admit her for atypical chest pain.  I spoke with her about type I and type II MI, and we will check one more troponin level in the am.  Will continue her Coreg, ACE I and Clonidine for her HTN.  Check a lipid profile in the am.  Will empirically Tx her for GI causes, but consult cardiology for possible nuclear stress test.  I needs to quit smoking as soon as possible.   Thank you and Good day.   Other plans as per orders.  Code Status: FULL Haskel Khan, MD. [facility] Pager [phone] 7pm to 7am.  03/08/2015, 3:19 PM

## 2015-03-09 ENCOUNTER — Encounter (HOSPITAL_COMMUNITY): Payer: BLUE CROSS/BLUE SHIELD

## 2015-03-09 ENCOUNTER — Encounter (HOSPITAL_BASED_OUTPATIENT_CLINIC_OR_DEPARTMENT_OTHER): Payer: BLUE CROSS/BLUE SHIELD

## 2015-03-09 ENCOUNTER — Encounter (HOSPITAL_COMMUNITY): Payer: Self-pay | Admitting: Adult Health

## 2015-03-09 ENCOUNTER — Observation Stay (HOSPITAL_BASED_OUTPATIENT_CLINIC_OR_DEPARTMENT_OTHER): Payer: BLUE CROSS/BLUE SHIELD

## 2015-03-09 DIAGNOSIS — Z720 Tobacco use: Secondary | ICD-10-CM | POA: Diagnosis not present

## 2015-03-09 DIAGNOSIS — R079 Chest pain, unspecified: Secondary | ICD-10-CM

## 2015-03-09 DIAGNOSIS — R0789 Other chest pain: Secondary | ICD-10-CM | POA: Diagnosis not present

## 2015-03-09 DIAGNOSIS — I10 Essential (primary) hypertension: Secondary | ICD-10-CM

## 2015-03-09 DIAGNOSIS — E7800 Pure hypercholesterolemia, unspecified: Secondary | ICD-10-CM | POA: Diagnosis not present

## 2015-03-09 DIAGNOSIS — F329 Major depressive disorder, single episode, unspecified: Secondary | ICD-10-CM | POA: Diagnosis not present

## 2015-03-09 DIAGNOSIS — E785 Hyperlipidemia, unspecified: Secondary | ICD-10-CM | POA: Diagnosis not present

## 2015-03-09 DIAGNOSIS — Z8249 Family history of ischemic heart disease and other diseases of the circulatory system: Secondary | ICD-10-CM

## 2015-03-09 DIAGNOSIS — I2510 Atherosclerotic heart disease of native coronary artery without angina pectoris: Secondary | ICD-10-CM | POA: Diagnosis not present

## 2015-03-09 LAB — LIPID PANEL
Cholesterol: 180 mg/dL (ref 0–200)
HDL: 30 mg/dL — AB (ref >40)
LDL CALC: 110 mg/dL — AB (ref 0–99)
TRIGLYCERIDES: 199 mg/dL — AB (ref <150)
Total CHOL/HDL Ratio: 6 RATIO
VLDL: 40 mg/dL (ref 0–40)

## 2015-03-09 LAB — NM MYOCAR MULTI W/SPECT W/WALL MOTION / EF
CHL CUP NUCLEAR SRS: 5
CHL CUP NUCLEAR SSS: 5
LV dias vol: 72 mL
LV sys vol: 35 mL
NUC STRESS TID: 0.97
Peak HR: 91 {beats}/min
RATE: 0.16
Rest HR: 58 {beats}/min
SDS: 0

## 2015-03-09 LAB — TROPONIN I: Troponin I: <0.03 ng/mL (ref <0.031)

## 2015-03-09 MED ORDER — ASPIRIN 81 MG PO TBEC: 81.0000 mg | DELAYED_RELEASE_TABLET | Freq: Every day | ORAL | Status: DC

## 2015-03-09 MED ORDER — REGADENOSON 0.4 MG/5ML IV SOLN
INTRAVENOUS | Status: AC
  Administered 2015-03-09: 0.4 mg via INTRAVENOUS
  Filled 2015-03-09: qty 5

## 2015-03-09 MED ORDER — TECHNETIUM TC 99M SESTAMIBI - CARDIOLITE
10.0000 | Freq: Once | INTRAVENOUS | Status: AC | PRN
  Administered 2015-03-09: 9 via INTRAVENOUS

## 2015-03-09 MED ORDER — REGADENOSON 0.4 MG/5ML IV SOLN
0.4000 mg | Freq: Once | INTRAVENOUS | Status: AC
  Administered 2015-03-09: 0.4 mg via INTRAVENOUS
  Filled 2015-03-09: qty 5

## 2015-03-09 MED ORDER — PANTOPRAZOLE SODIUM 40 MG PO TBEC: 40.0000 mg | DELAYED_RELEASE_TABLET | Freq: Every day | ORAL | Status: DC

## 2015-03-09 MED ORDER — SODIUM CHLORIDE 0.9 % IJ SOLN
INTRAMUSCULAR | Status: AC
  Administered 2015-03-09: 10 mL via INTRAVENOUS
  Filled 2015-03-09: qty 3

## 2015-03-09 MED ORDER — EZETIMIBE 10 MG PO TABS
10.0000 mg | ORAL_TABLET | Freq: Every day | ORAL | Status: DC
  Administered 2015-03-09: 10 mg via ORAL
  Filled 2015-03-09: qty 1

## 2015-03-09 MED ORDER — EZETIMIBE 10 MG PO TABS: 10.0000 mg | ORAL_TABLET | Freq: Every day | ORAL | Status: DC

## 2015-03-09 MED ORDER — TECHNETIUM TC 99M SESTAMIBI GENERIC - CARDIOLITE
30.0000 | Freq: Once | INTRAVENOUS | Status: AC | PRN
  Administered 2015-03-09: 27.5 via INTRAVENOUS

## 2015-03-09 NOTE — Discharge Summary (Signed)
Physician Discharge Summary  Jamie Mcgee VQQ:[id number] DOB: Feb 20, 1961 DOA: 03/08/2015  PCP: Jannifer Rodney, FNP  Admit date: 03/08/2015 Discharge date: 03/09/2015  Time spent: 35 minutes  Recommendations for Outpatient Follow-up:  1. Follow up with your PCP in one week. 2. Follow up with Cardiology as scheduled.   Discharge Diagnoses:  Principal Problem:   Chest pain, atypical Active Problems:   Essential hypertension, benign   Tobacco abuse   Atypical chest pain   Discharge Condition: Stable, pain free.   Diet recommendation: Cardiac diet.    Filed Weights   03/08/15 1326 03/08/15 1737  Weight: 84.732 kg (186 lb 12.8 oz) 84.732 kg (186 lb 12.8 oz)    History of present illness: patient was admitted by me as a direct admit from OSH for atypical chest pain.  As per my prior H and P:  " Jamie Mcgee is an 54 y.o. female with hx of depression on Celexa, HLD but has lipitor allergy, tobacco abuse, and family hx of CAD (father died at age 68 of a "heart attack"), obesity, HTN on Coreg, ACE I, and Clonidine patch, sent in from OSH for chest pain. She experienced transient CP lasting less than 15 minutes, non exertional, and accompanied by only nausea, but no SOB or diaphoresis. She has GERD symptoms, but denied melana, BRBPR or hematemesis. Record showed 2 negative troponins, and unremarkable EKG, along with clear CXR. She also had a RUQ [place] with normal GB and negative murphy sign. She was accepted in transfer by Dr Cena Benton at [facility], but would like to come to [facility]. Currently she is asymptomatic.    Hospital Course: Patient was admitted into telemetry and she was r/out with serial EKG and troponins.  She never had any recurrence of her CP while in the hospital.  The changes were made as followed.  Due to her relative bradycardia, and fatigue, we stopped her clonidine.  Her BP remained controlled.  The LDL was elevated to 110, so she was started on Zetia.  We spoke about statin  with CoQ10 helping myalgia, and spoke about Pravachol having low incidence, but toward the end, she would like to try Zetia.  She was seen in consultation with cardiology, and a nuclear stress test was performed, resulting in low cardiac risk.  She will therefore be discharged today with cardiology follow up appointment made by Dr Haywood Pao.  She will see her PCP in a week as well.  We will discharge her on PPI.  Thank you so much for allowing me to participate in her care.    Consultations:  Cardiology.  Discharge Exam: Filed Vitals:   03/09/15 0726  BP: 120/75  Pulse: 75  Temp: 98 F (36.7 C)  Resp: 14    Discharge Instructions   Discharge Instructions    Diet - low sodium heart healthy    Complete by:  As directed      Diet - low sodium heart healthy    Complete by:  As directed      Discharge instructions    Complete by:  As directed   Take your medicine as Dr Conley Rolls discussed with you.  Follow up with your PCP and your cardiologist as scheduled.     Increase activity slowly    Complete by:  As directed      Increase activity slowly    Complete by:  As directed           Current Discharge Medication List  START taking these medications   Details  aspirin EC 81 MG EC tablet Take 1 tablet (81 mg total) by mouth daily. Qty: 100 tablet, Refills: 1    ezetimibe (ZETIA) 10 MG tablet Take 1 tablet (10 mg total) by mouth daily. Qty: 30 tablet, Refills: 1    pantoprazole (PROTONIX) 40 MG tablet Take 1 tablet (40 mg total) by mouth daily at 6 (six) AM. Qty: 30 tablet, Refills: 1      CONTINUE these medications which have NOT CHANGED   Details  benazepril (LOTENSIN) 40 MG tablet Take 1 tablet (40 mg total) by mouth daily. Qty: 90 tablet, Refills: 1   Associated Diagnoses: Essential hypertension, benign    carvedilol (COREG) 6.25 MG tablet Take 1 tablet (6.25 mg total) by mouth 2 (two) times daily with a meal. Qty: 180 tablet, Refills: 1   Associated Diagnoses: Essential  hypertension    citalopram (CELEXA) 20 MG tablet Take 1 tablet (20 mg total) by mouth daily. Qty: 90 tablet, Refills: 1    estrogen, conjugated,-medroxyprogesterone (PREMPRO) 0.3-1.5 MG per tablet Take 1 tablet by mouth daily. Qty: 30 tablet, Refills: 3    Krill Oil 1000 MG CAPS Take 1,000 mg by mouth daily.    Multiple Vitamin (MULITIVITAMIN WITH MINERALS) TABS Take 1 tablet by mouth daily.        STOP taking these medications     cloNIDine (CATAPRES) 0.1 MG tablet        Allergies  Allergen Reactions  . Lipitor [Atorvastatin] Other (See Comments)    Muscle aches and nausea   Follow-up Information    Follow up with Laqueta Linden, MD On 04/09/2015.   Specialty:  Cardiology   Why:  at 10:45 am in the [place] office   Contact information:   [address]  [place] [address] [phone]        The results of significant diagnostics from this hospitalization (including imaging, microbiology, ancillary and laboratory) are listed below for reference.    Labs: Basic Metabolic Panel:  Recent Labs Lab 03/08/15 1533  CREATININE 0.98   CBC:  Recent Labs Lab 03/08/15 1533  WBC 10.0  HGB 15.4*  HCT 44.6  MCV 91.6  PLT 239   Cardiac Enzymes:  Recent Labs Lab 03/09/15 0540  TROPONINI <0.03    Signed:  Miriam Liles  [facility] 03/09/2015, 3:36 PM

## 2015-03-09 NOTE — Consult Note (Addendum)
CARDIOLOGY CONSULT NOTE   Patient ID: Jamie Mcgee MRN: [id number] DOB/AGE: [birth year] 54 y.o.  Admit Date: 03/08/2015 Referring Physician: PTH-Lee MD Primary Physician: Evelina Dun, FNP Consulting Cardiologist: Kate Sable MD Primary Cardiologist: Formerly, Dr. Albertine Patricia Maryanna Shape not since 2006).  Reason for Consultation: Chest Pain  Clinical Summary Jamie Mcgee is a 54 y.o.female with known history of hypertension, PAD,  hypercholesterolemia, GERD,. depression and anxiety, and tobacco abuse, presents to ER with complaints of chest pain. She has a history of abdominal angiography by Dr. Albertine Patricia, interventional cardiologist in 2006, with balloon angioplasty of the right renal artery in the setting of uncontrolled hypertension.    She states that she felt chest tightness 3 days ago (Sunday) and rested on the couch that day. 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  She pulled over to an urgent care facility in [place], [place] where she was evaluate with EKG, given NTG sublingual, and morphine. She states that the tightness did not subside. She was transferred to [facility]   In ER, BP 133/79, HR 53 bpm, afebrile. Troponin negative X1. Labs essentially unremarkable. Review of records from [facility] on paper chart, demonstrated that she was hypertensive in there office at 185/95, Labs there were unremarkable.troponins were negative X 2. CXR was negative for acute cardiopulmonary abnormalities. GB ultrasound was also negative with the exception of hepatic stenosis. EKG demonstrated non-specific T-wave abnormalities in V5 and V6. Rate of 66 bpm.   She states that the pain has not completely gone away. Stating that it is tightness and waxes and wanes, even since admission, but not as severe as yesterday. She also  complains of chronic left shoulder pain which has bothered her for years.   Allergies  Allergen Reactions  . Lipitor [Atorvastatin] Other (See Comments)    Muscle aches and nausea    Medications Scheduled Medications: . aspirin EC  81 mg Oral Daily  . benazepril  40 mg Oral Daily  . carvedilol  6.25 mg Oral BID WC  . citalopram  20 mg Oral Daily  . cloNIDine  0.1 mg Oral BID  . enoxaparin (LOVENOX) injection  40 mg Subcutaneous Q24H  . pantoprazole  40 mg Oral Q0600  . sodium chloride  3 mL Intravenous Q12H  . sodium chloride  3 mL Intravenous Q12H    Infusions:    PRN Medications: sodium chloride, alum & mag hydroxide-simeth, morphine injection, ondansetron **OR** ondansetron (ZOFRAN) IV, sodium chloride   Past Medical History  Diagnosis Date  . HTN (hypertension)   . Depression     hx of  . 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 Cataract extraction  08-2010    left   . Tubal ligation  1984    [facility]  . Renal artery angioplasty  2006    right  . Cataract extraction w/phaco  06/01/2011    Procedure: CATARACT EXTRACTION PHACO AND INTRAOCULAR LENS PLACEMENT (IOC);  Surgeon: Tonny Branch;  Location: [facility];  Service: Ophthalmology;  Laterality: Right;  CDE:9.62  . Appendectomy  1993    Family History  Problem Relation Age of Onset  . Coronary artery disease Father     States congenital abnormality  . Anesthesia problems Neg Hx   . Hypotension Neg  Hx   . Malignant hyperthermia Neg Hx   . Pseudochol deficiency Neg Hx   . CAD Sister     Stents     Social History Jamie Mcgee reports that she has been smoking Cigarettes.  She has a 30 pack-year smoking history. She does not have any smokeless tobacco history on file. Jamie Mcgee reports that she does not drink alcohol.  Review of Systems Complete review of systems are found to be negative unless outlined in H&P above.  Physical Examination Blood  pressure 120/75, pulse 75, temperature 98 F (36.7 C), temperature source Oral, resp. rate 14, height 5\' 4"  (1.626 m), weight 186 lb 12.8 oz (84.732 kg), last menstrual period 03/25/2011, SpO2 98 %.  Intake/Output Summary (Last 24 hours) at 03/09/15 0822 Last data filed at 03/09/15 0650  Gross per 24 hour  Intake    480 ml  Output   1000 ml  Net   -520 ml    Telemetry: NSR  GEN: No acute distress.  HEENT: Conjunctiva and lids normal, oropharynx clear with moist mucosa. Neck: Supple, no elevated JVP or carotid bruits, no thyromegaly. Lungs: Clear to auscultation, nonlabored breathing at rest. Cardiac: Regular rate and rhythm, no S3 or significant systolic murmur, no pericardial rub. Abdomen: Soft, nontender, no hepatomegaly, bowel sounds present, no guarding or rebound. Extremities: No pitting edema, distal pulses 2+. Skin: Warm and dry. Musculoskeletal: No kyphosis. Neuropsychiatric: Alert and oriented x3, affect grossly appropriate.  Prior Cardiac Testing/Procedures Abdominal angiography 2006 with PTCA of right renal artery. FINDINGS: 1. Abdominal aorta: No evidence of atherosclerosis in the abdominal aorta  or proximal portion of the common iliacs bilaterally. 2. Right renal artery: Single vessel, fibromuscular dysplasia of the  proximal and mid vessel treated with balloon angioplasty. 3. Left renal artery is angiographically normal. 4. Both kidneys are of normal size and have normal contrast uptake.  Lab Results  Basic Metabolic Panel:  Recent Labs Lab 03/08/15 1533  CREATININE 0.98    CBC:  Recent Labs Lab 03/08/15 1533  WBC 10.0  HGB 15.4*  HCT 44.6  MCV 91.6  PLT 239    Cardiac Enzymes:  Recent Labs Lab 03/09/15 0540  TROPONINI <0.03    ECG: Pending from this am.    Impression and Recommendations  1. Chest Pain: Typical and atypical features. She describes it as tightness that is there all the time, despite NTG and morphine.  Waxes and wanes, but is constant since Sunday. Troponin is negative this am, and was negative X 2 prior to admission on review of records from [facility]. I will repeat EKG this am. Have Cardiolite stress test ordered and plan for echo.   CVRF include: FH, ongoing tobacco abuse, hypertension, PAD, and hypercholesterolemia. Continue ASA, BB.   2. Hypertension: Currently well controlled. She is on clonidine 0.1 mg, coreg, and lisinopril. Creatinine is 0.98. It is noted that she was slightly bradycardic. Clonidine contributes to this and may need to be changed to amlodipine instead if bradycardia is etiology of chest discomfort. Will discuss further with Dr. Bronson Ing before making changes.   3. Hypercholesterolemia; Review of lipid panel has LDL at 110. May need to consider Zetia as she does not tolerate statins.    Signed: Phill Myron. Lawrence NP AACC  03/09/2015, 8:22 AM Co-Sign MD  The patient was seen and examined, and I agree with the assessment and plan as documented above, with modifications as noted below. Pt with aforementioned history presenting with chest tightness. First  episode occurred on Sunday and then had recurrence yesterday while driving. Radiated "behind right breast" and into left shoulder and back. Has felt fatigued for last few months with any activity. Long h/o tobacco abuse, smokes 1-1.5 ppd since age 69. Aims to quit by October 20th through program at work. Father reportedly died of MI at age 52. Has ruled out for an acute coronary syndrome. Has several CV risk factors.  10-year ASCVD risk is 9.1%, thus moderate to high intensity statin therapy is warranted. Can first engage in therapeutic lifestyle changes. Will review previously ordered echocardiogram to assess LV function and regional wall motion. Will proceed with inpatient stress testing. She has plantar fasciitis but will try and walk on a treadmill. Otherwise, will obtain a Lexiscan  Cardiolite.  ADDENDUM: Nuclear stress test was low risk, calculated LVEF 51%. Results communicated to Dr. Marin Comment. She can be discharged from my perspective. I will arrange for outpatient follow up with me.

## 2015-03-09 NOTE — Care Management Note (Signed)
Case Management Note  Patient Details  Name: Jamie Mcgee MRN: [id number] Date of Birth: 11-16-1960  Subjective/Objective:                  Pt admitted from home with CP. Pt lives with her husband and will return home at discharge. Pt is independent with ADL's.  Action/Plan: Pt for discharge home today. No CM needs noted.  Expected Discharge Date:                  Expected Discharge Plan:  Home/Self Care  In-House Referral:  NA  Discharge planning Services  CM Consult  Post Acute Care Choice:  NA Choice offered to:  NA  DME Arranged:    DME Agency:     HH Arranged:    HH Agency:     Status of Service:  Completed, signed off  Medicare Important Message Given:    Date Medicare IM Given:    Medicare IM give by:    Date Additional Medicare IM Given:    Additional Medicare Important Message give by:     If discussed at [place] of Stay Meetings, dates discussed:    Additional Comments:  Joylene Draft, RN 03/09/2015, 3:10 PM

## 2015-03-31 ENCOUNTER — Telehealth: Payer: Self-pay | Admitting: Family

## 2015-03-31 ENCOUNTER — Ambulatory Visit (INDEPENDENT_AMBULATORY_CARE_PROVIDER_SITE_OTHER): Payer: BLUE CROSS/BLUE SHIELD | Admitting: Family Medicine

## 2015-03-31 ENCOUNTER — Encounter: Payer: Self-pay | Admitting: Family Medicine

## 2015-03-31 VITALS — BP 148/86 | HR 64 | Temp 100.2°F | Ht 64.0 in | Wt 191.0 lb

## 2015-03-31 DIAGNOSIS — I10 Essential (primary) hypertension: Secondary | ICD-10-CM | POA: Diagnosis not present

## 2015-03-31 DIAGNOSIS — R300 Dysuria: Secondary | ICD-10-CM | POA: Diagnosis not present

## 2015-03-31 LAB — POCT URINALYSIS DIPSTICK
BILIRUBIN UA: NEGATIVE
Glucose, UA: NEGATIVE
Ketones, UA: NEGATIVE
Leukocytes, UA: NEGATIVE
NITRITE UA: NEGATIVE
PH UA: 7.5
Protein, UA: NEGATIVE
RBC UA: NEGATIVE
Spec Grav, UA: 1.01
UROBILINOGEN UA: NEGATIVE

## 2015-03-31 LAB — POCT UA - MICROSCOPIC ONLY
Bacteria, U Microscopic: NEGATIVE
CRYSTALS, UR, HPF, POC: NEGATIVE
Casts, Ur, LPF, POC: NEGATIVE
Mucus, UA: NEGATIVE
RBC, URINE, MICROSCOPIC: NEGATIVE
WBC, UR, HPF, POC: NEGATIVE
Yeast, UA: NEGATIVE

## 2015-03-31 MED ORDER — HYDROCHLOROTHIAZIDE 25 MG PO TABS: 25.0000 mg | ORAL_TABLET | Freq: Every day | ORAL | Status: DC

## 2015-03-31 NOTE — Telephone Encounter (Signed)
Stp and she states she was taken off clonidine at discharge in the hospital and pt has recently stopped smoking. Pt states her BP has been running 150s, 160s over high 90s and she has follow up appt with Cardiology 11/4. Pt has never been seen by the cardiologist but was given appt after hospitalization for chest pain. Pt has no complaints of blurred vision, dizziness or headaches. What would you like me to tell her.

## 2015-03-31 NOTE — Assessment & Plan Note (Signed)
Will add hydrochlorothiazide to her benazepril. Follow-up in one month with Christy a Hawks.

## 2015-03-31 NOTE — Telephone Encounter (Signed)
Pt given appt today at 5.

## 2015-03-31 NOTE — Telephone Encounter (Signed)
Pt needs to be seen

## 2015-03-31 NOTE — Progress Notes (Signed)
BP 148/86 mmHg  Pulse 64  Temp(Src) 100.2 F (37.9 C) (Oral)  Ht 5\' 4"  (1.626 m)  Wt 191 lb (86.637 kg)  BMI 32.77 kg/m2  LMP 03/25/2011   Subjective:    Patient ID: Jamie Mcgee, female    DOB: [birth year], 54 y.o.   MRN: [id number]  HPI: Jamie Mcgee is a 54 y.o. female presenting on 03/31/2015 for Hypertension   HPI Elevated blood pressure Patient been having elevated blood pressure since leaving the hospital visit 3 weeks ago. Her blood pressures at home and been up in the 180 range on the high end and been seen in the 160 range most of the time. She is taking her benazepril but she was also on clonidine before. The hospital stop the clonidine because of her fatigue and low heart rate. She does not have any headaches. She has chest pain which is atypical and the same as what she was having when she was in the hospital. They did a lot of testing and also referral to outpatient cardiology who she is going to see tomorrow.  Fever Patient has been having a fever just today. She denies any cough or upper respiratory symptoms. She does admit to having some dysuria that just started yesterday. She denies any abdominal pain or flank pain.  Relevant past medical, surgical, family and social history reviewed and updated as indicated. Interim medical history since our last visit reviewed. Allergies and medications reviewed and updated.  Review of Systems  Constitutional: Positive for fever (just now at the office). Negative for chills.  HENT: Negative for congestion, ear discharge and ear pain.   Eyes: Negative for redness and visual disturbance.  Respiratory: Negative for cough, chest tightness, shortness of breath and wheezing.   Cardiovascular: Positive for chest pain (Continued but unchanged). Negative for leg swelling.  Genitourinary: Negative for dysuria and difficulty urinating.  Musculoskeletal: Negative for back pain and gait problem.  Skin: Negative for rash.    Neurological: Negative for dizziness, weakness, light-headedness, numbness and headaches.  Psychiatric/Behavioral: Negative for behavioral problems and agitation.  All other systems reviewed and are negative.   Per HPI unless specifically indicated above     Medication List       This list is accurate as of: 03/31/15  5:35 PM.  Always use your most recent med list.               aspirin 81 MG EC tablet  Take 1 tablet (81 mg total) by mouth daily.     benazepril 40 MG tablet  Commonly known as:  LOTENSIN  Take 1 tablet (40 mg total) by mouth daily.     carvedilol 6.25 MG tablet  Commonly known as:  COREG  Take 1 tablet (6.25 mg total) by mouth 2 (two) times daily with a meal.     cholecalciferol 1000 UNITS tablet  Commonly known as:  VITAMIN D  Take 1,000 Units by mouth daily.     citalopram 20 MG tablet  Commonly known as:  CELEXA  Take 1 tablet (20 mg total) by mouth daily.     estrogen (conjugated)-medroxyprogesterone 0.3-1.5 MG tablet  Commonly known as:  PREMPRO  Take 1 tablet by mouth daily.     hydrochlorothiazide 25 MG tablet  Commonly known as:  HYDRODIURIL  Take 1 tablet (25 mg total) by mouth daily.     Krill Oil 1000 MG Caps  Take 1,000 mg by mouth daily.     pantoprazole  40 MG tablet  Commonly known as:  PROTONIX  Take 1 tablet (40 mg total) by mouth daily at 6 (six) AM.     vitamin C 500 MG tablet  Commonly known as:  ASCORBIC ACID  Take 500 mg by mouth daily.           Objective:    BP 148/86 mmHg  Pulse 64  Temp(Src) 100.2 F (37.9 C) (Oral)  Ht 5\' 4"  (1.626 m)  Wt 191 lb (86.637 kg)  BMI 32.77 kg/m2  LMP 03/25/2011  Wt Readings from Last 3 Encounters:  03/31/15 191 lb (86.637 kg)  03/08/15 186 lb 12.8 oz (84.732 kg)  01/29/15 184 lb (83.462 kg)    Physical Exam  Constitutional: She is oriented to person, place, and time. She appears well-developed and well-nourished. No distress.  Eyes: Conjunctivae and EOM are normal.  Pupils are equal, round, and reactive to light.  Neck: Neck supple. No thyromegaly present.  Cardiovascular: Normal rate, regular rhythm, normal heart sounds and intact distal pulses.   No murmur heard. Pulmonary/Chest: Effort normal and breath sounds normal. No respiratory distress. She has no wheezes.  Abdominal: Soft. Bowel sounds are normal. There is no tenderness. There is no rebound and no guarding.  Musculoskeletal: Normal range of motion. She exhibits no edema or tenderness.  Lymphadenopathy:    She has no cervical adenopathy.  Neurological: She is alert and oriented to person, place, and time. Coordination normal.  Skin: Skin is warm and dry. No rash noted. She is not diaphoretic.  Psychiatric: She has a normal mood and affect. Her behavior is normal.  Vitals reviewed.   Results for orders placed or performed during the hospital encounter of 03/08/15  NM Myocar Multi W/Spect W/Wall Motion / EF  Result Value Ref Range   Rest HR 58 bpm   Rest BP 104/75 mmHg   Exercise duration (min)  min   Exercise duration (sec)  sec   Estimated workload  METS   Peak HR 91 bpm   Peak BP 124/71 mmHg   MPHR  bpm   Percent HR  %   RPE     LV Systolic Volume 35 mL   TID 1.61    LV Diastolic Volume 72 mL   LHR 0.16    SSS 5    SRS 5    SDS 0   CBC  Result Value Ref Range   WBC 10.0 4.0 - 10.5 K/uL   RBC 4.87 3.87 - 5.11 MIL/uL   Hemoglobin 15.4 (H) 12.0 - 15.0 g/dL   HCT 44.6 36.0 - 46.0 %   MCV 91.6 78.0 - 100.0 fL   MCH 31.6 26.0 - 34.0 pg   MCHC 34.5 30.0 - 36.0 g/dL   RDW 12.9 11.5 - 15.5 %   Platelets 239 150 - 400 K/uL  Creatinine, serum  Result Value Ref Range   Creatinine, Ser 0.98 0.44 - 1.00 mg/dL   GFR calc non Af Amer >60 >60 mL/min   GFR calc Af Amer >60 >60 mL/min  Troponin I  Result Value Ref Range   Troponin I <0.03 <0.031 ng/mL  Lipid panel  Result Value Ref Range   Cholesterol 180 0 - 200 mg/dL   Triglycerides 199 (H) <150 mg/dL   HDL 30 (L) >40 mg/dL    Total CHOL/HDL Ratio 6.0 RATIO   VLDL 40 0 - 40 mg/dL   LDL Cholesterol 110 (H) 0 - 99 mg/dL      Assessment &  Plan:   Problem List Items Addressed This Visit      Cardiovascular and Mediastinum   Essential hypertension, benign - Primary    Will add hydrochlorothiazide to her benazepril. Follow-up in one month with Christy a Hawks.      Relevant Medications   hydrochlorothiazide (HYDRODIURIL) 25 MG tablet    Other Visit Diagnoses    Dysuria        We'll check UA. Urinalysis came back normal. Increase fluid intake and will monitor.    Relevant Orders    POCT urinalysis dipstick (Completed)    POCT UA - Microscopic Only (Completed)        Follow up plan: Return in about 4 weeks (around 04/28/2015), or if symptoms worsen or fail to improve, for htn recheck.  Caryl Pina, MD [facility] 03/31/2015, 5:35 PM

## 2015-04-09 ENCOUNTER — Encounter: Payer: Self-pay | Admitting: Cardiovascular Disease

## 2015-04-09 ENCOUNTER — Ambulatory Visit (INDEPENDENT_AMBULATORY_CARE_PROVIDER_SITE_OTHER): Payer: BLUE CROSS/BLUE SHIELD | Admitting: Cardiovascular Disease

## 2015-04-09 VITALS — BP 118/74 | HR 90 | Ht 64.0 in | Wt 187.7 lb

## 2015-04-09 DIAGNOSIS — R079 Chest pain, unspecified: Secondary | ICD-10-CM

## 2015-04-09 DIAGNOSIS — Z87898 Personal history of other specified conditions: Secondary | ICD-10-CM | POA: Diagnosis not present

## 2015-04-09 DIAGNOSIS — I10 Essential (primary) hypertension: Secondary | ICD-10-CM | POA: Diagnosis not present

## 2015-04-09 DIAGNOSIS — F17201 Nicotine dependence, unspecified, in remission: Secondary | ICD-10-CM

## 2015-04-09 DIAGNOSIS — Z9289 Personal history of other medical treatment: Secondary | ICD-10-CM

## 2015-04-09 MED ORDER — NITROGLYCERIN 0.4 MG SL SUBL: 0.4000 mg | SUBLINGUAL_TABLET | SUBLINGUAL | Status: DC | PRN

## 2015-04-09 NOTE — Patient Instructions (Signed)
Your physician recommends that you schedule a follow-up appointment in: 3 months with Dr Bronson Ing  Take nitroglycerin as directed    If you need a refill on your cardiac medications before your next appointment, please call your pharmacy.      Thank you for choosing [facility] !

## 2015-04-09 NOTE — Progress Notes (Signed)
Patient ID: Jamie Mcgee, female   DOB: [birth year], 54 y.o.   MRN: [id number]      SUBJECTIVE: The patient presents for posthospitalization follow-up for chest pain. She underwent a low risk inpatient nuclear stress test with no evidence of myocardial ischemia or scar. Significant inferior wall soft tissue attenuation artifact was noted. Echocardiogram demonstrated normal left ventricular systolic function and regional wall motion, LVEF 60-65%. She has a long history of tobacco abuse and has smoked one to one and a half packs per day since the age of 62 but quit October 17th.  She continues to have episodic chest pain primarily occurring at rest. Her blood pressure has fluctuated quite a bit with readings as high as 167/112 last week, for which she was started on hydrochlorothiazide. This morning it was 157/109. Her blood pressure cuff is 55 years old. She denies leg swelling and palpitations. She has chronic exertional dyspnea which she attributes to her long history of tobacco abuse.  Review of Systems: As per "subjective", otherwise negative.  Allergies  Allergen Reactions  . Lipitor [Atorvastatin] Other (See Comments)    Muscle aches and nausea    Current Outpatient Prescriptions  Medication Sig Dispense Refill  . aspirin EC 81 MG EC tablet Take 1 tablet (81 mg total) by mouth daily. 100 tablet 1  . benazepril (LOTENSIN) 40 MG tablet Take 1 tablet (40 mg total) by mouth daily. 90 tablet 1  . carvedilol (COREG) 6.25 MG tablet Take 1 tablet (6.25 mg total) by mouth 2 (two) times daily with a meal. 180 tablet 1  . cholecalciferol (VITAMIN D) 1000 UNITS tablet Take 2 Units by mouth daily.     . citalopram (CELEXA) 20 MG tablet Take 1 tablet (20 mg total) by mouth daily. 90 tablet 1  . estrogen, conjugated,-medroxyprogesterone (PREMPRO) 0.3-1.5 MG per tablet Take 1 tablet by mouth daily. 30 tablet 3  . hydrochlorothiazide (HYDRODIURIL) 25 MG tablet Take 1 tablet (25 mg total) by mouth  daily. 90 tablet 2  . Krill Oil 1000 MG CAPS Take 1,000 mg by mouth daily.    . pantoprazole (PROTONIX) 40 MG tablet Take 1 tablet (40 mg total) by mouth daily at 6 (six) AM. 30 tablet 1  . vitamin C (ASCORBIC ACID) 500 MG tablet Take 500 mg by mouth daily.     No current facility-administered medications for this visit.    Past Medical History  Diagnosis Date  . HTN (hypertension)   . Depression     hx of  . 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 Cataract extraction  08-2010    left   . Tubal ligation  1984    [facility]  . Renal artery angioplasty  2006    right  . Cataract extraction w/phaco  06/01/2011    Procedure: CATARACT EXTRACTION PHACO AND INTRAOCULAR LENS PLACEMENT (IOC);  Surgeon: Tonny Branch;  Location: [facility];  Service: Ophthalmology;  Laterality: Right;  CDE:9.62  . Appendectomy  1993    Social History   Social History  . Marital Status: Divorced    Spouse Name: N/A  . Number of Children: N/A  . Years of Education: N/A   Occupational History  . Not on file.   Social History Main Topics  . Smoking status: Former Smoker -- 1.00 packs/day for 30 years    Types: Cigarettes    Quit date: 03/22/2015  . Smokeless tobacco: Not on file  .  Alcohol Use: No  . Drug Use: 1.00 per week    Special: Marijuana  . Sexual Activity: Yes    Birth Control/ Protection: None   Other Topics Concern  . Not on file   Social History Narrative   The patient does not have any sex drive and has not had one for almost a year.   Her   husband thinks she does not love him anymore.  He does not seem to understand   her   depression.     Filed Vitals:   04/09/15 1022  BP: 118/74  Pulse: 90  Height: 5\' 4"  (1.626 m)  Weight: 187 lb 11.2 oz (85.14 kg)  SpO2: 98%    PHYSICAL EXAM General: NAD HEENT: Normal. Neck: No JVD, no thyromegaly. Lungs: Diminished throughout, no rales. CV: Nondisplaced PMI.  Regular  rate and rhythm, normal S1/S2, no S3/S4, no murmur. No pretibial or periankle edema.   Abdomen: Soft, nontender, no distention.  Neurologic: Alert and oriented x 3.  Psych: Normal affect. Skin: Normal. Musculoskeletal: Normal range of motion, no gross deformities. Extremities: No clubbing or cyanosis.   ECG: Most recent ECG reviewed.      ASSESSMENT AND PLAN: 1. Chest pain: Given low risk nuclear stress test and normal LV function and regional wall motion by echocardiography, no further testing is indicated at this time. However, I will prescribe sublingual nitroglycerin to be used as needed.  2. Essential HTN: Controlled today on hydrochlorothiazide 25 mg and benazepril 40 mg. Given that her blood pressure cuff is 55 years old, I have asked her to bring it in so that we can make certain it is appropriately calibrated.  3. Tobacco abuse, in remission.  Dispo: f/u 3 months.  Kate Sable, M.D., F.A.C.C.

## 2015-04-22 ENCOUNTER — Encounter: Payer: Self-pay | Admitting: Family

## 2015-04-22 ENCOUNTER — Ambulatory Visit (INDEPENDENT_AMBULATORY_CARE_PROVIDER_SITE_OTHER): Payer: BLUE CROSS/BLUE SHIELD | Admitting: Family

## 2015-04-22 ENCOUNTER — Ambulatory Visit (INDEPENDENT_AMBULATORY_CARE_PROVIDER_SITE_OTHER): Payer: BLUE CROSS/BLUE SHIELD

## 2015-04-22 VITALS — BP 128/85 | HR 84 | Temp 98.5°F | Ht 64.0 in | Wt 194.2 lb

## 2015-04-22 DIAGNOSIS — M25512 Pain in left shoulder: Secondary | ICD-10-CM | POA: Diagnosis not present

## 2015-04-22 MED ORDER — CYCLOBENZAPRINE HCL 10 MG PO TABS: 10.0000 mg | ORAL_TABLET | Freq: Three times a day (TID) | ORAL | Status: DC | PRN

## 2015-04-22 MED ORDER — NAPROXEN 500 MG PO TABS: 500.0000 mg | ORAL_TABLET | Freq: Two times a day (BID) | ORAL | Status: DC

## 2015-04-22 MED ORDER — BUPIVACAINE HCL 0.25 % IJ SOLN
1.0000 mL | Freq: Once | INTRAMUSCULAR | Status: AC
  Administered 2015-04-22: 1 mL via INTRA_ARTICULAR

## 2015-04-22 MED ORDER — METHYLPREDNISOLONE ACETATE 40 MG/ML IJ SUSP
40.0000 mg | Freq: Once | INTRAMUSCULAR | Status: AC
  Administered 2015-04-22: 40 mg via INTRA_ARTICULAR

## 2015-04-22 NOTE — Progress Notes (Signed)
   Subjective:    Patient ID: Jamie Mcgee, female    DOB: 03-27-1961, 54 y.o.   MRN: [id number]  Shoulder Pain  The pain is present in the left shoulder. This is a new problem. The current episode started more than 1 month ago. There has been no history of extremity trauma. The problem occurs constantly. The problem has been unchanged. The quality of the pain is described as aching. The pain is at a severity of 10/10. The pain is moderate. Associated symptoms include a limited range of motion. Pertinent negatives include no inability to bear weight, joint locking, joint swelling, numbness, stiffness or tingling. The symptoms are aggravated by activity. She has tried NSAIDS and OTC ointments for the symptoms. The treatment provided mild relief.      Review of Systems  Constitutional: Negative.   HENT: Negative.   Eyes: Negative.   Respiratory: Negative.  Negative for shortness of breath.   Cardiovascular: Negative.  Negative for palpitations.  Gastrointestinal: Negative.   Endocrine: Negative.   Genitourinary: Negative.   Musculoskeletal: Negative.  Negative for stiffness.  Neurological: Negative.  Negative for tingling, numbness and headaches.  Hematological: Negative.   Psychiatric/Behavioral: Negative.   All other systems reviewed and are negative.      Objective:   Physical Exam  Constitutional: She is oriented to person, place, and time. She appears well-developed and well-nourished. No distress.  HENT:  Head: Normocephalic and atraumatic.  Eyes: Pupils are equal, round, and reactive to light.  Neck: Normal range of motion. Neck supple. No thyromegaly present.  Cardiovascular: Normal rate, regular rhythm, normal heart sounds and intact distal pulses.   No murmur heard. Pulmonary/Chest: Effort normal and breath sounds normal. No respiratory distress. She has no wheezes.  Abdominal: Soft. Bowel sounds are normal. She exhibits no distension. There is no tenderness.    Musculoskeletal: She exhibits no edema or tenderness.  Limited ROM of left shoulder with flexion and rotation related to pain  Neurological: She is alert and oriented to person, place, and time. She has normal reflexes. No cranial nerve deficit.  Skin: Skin is warm and dry.  Psychiatric: She has a normal mood and affect. Her behavior is normal. Judgment and thought content normal.  Vitals reviewed.   Procedure Note: Area cleaned with Chlorhexidine Marcaine and Depo-Medrol injected into shoulder Area cleaned and band-aid applied Pt tolerated well with no concerns or complications     BP 123456 mmHg  Pulse 84  Temp(Src) 98.5 F (36.9 C) (Oral)  Ht 5\' 4"  (1.626 m)  Wt 194 lb 3.2 oz (88.089 kg)  BMI 33.32 kg/m2  LMP 03/25/2011     Assessment & Plan:  1. Pain in left shoulder -Rest -Ice and heat as needed -Sedation precaution discussed -No other NSAID's while taking naprosyn -RTO in 2 weeks  - DG Shoulder Left; Future - naproxen (NAPROSYN) 500 MG tablet; Take 1 tablet (500 mg total) by mouth 2 (two) times daily with a meal.  Dispense: 30 tablet; Refill: 0 - methylPREDNISolone acetate (DEPO-MEDROL) injection 40 mg; Inject 1 mL (40 mg total) into the articular space once. - bupivacaine (MARCAINE) 0.25 % (with pres) injection 1 mL; Inject 1 mL into the articular space once. - cyclobenzaprine (FLEXERIL) 10 MG tablet; Take 1 tablet (10 mg total) by mouth 3 (three) times daily as needed for muscle spasms.  Dispense: 30 tablet; Refill: 0  Evelina Dun, FNP

## 2015-04-22 NOTE — Patient Instructions (Signed)

## 2015-05-07 ENCOUNTER — Ambulatory Visit (INDEPENDENT_AMBULATORY_CARE_PROVIDER_SITE_OTHER): Payer: BLUE CROSS/BLUE SHIELD | Admitting: Family

## 2015-05-07 ENCOUNTER — Encounter: Payer: Self-pay | Admitting: Family

## 2015-05-07 VITALS — BP 106/65 | HR 92 | Temp 98.3°F | Ht 64.0 in | Wt 197.2 lb

## 2015-05-07 DIAGNOSIS — N950 Postmenopausal bleeding: Secondary | ICD-10-CM

## 2015-05-07 DIAGNOSIS — Z1159 Encounter for screening for other viral diseases: Secondary | ICD-10-CM | POA: Diagnosis not present

## 2015-05-07 DIAGNOSIS — I10 Essential (primary) hypertension: Secondary | ICD-10-CM

## 2015-05-07 DIAGNOSIS — E559 Vitamin D deficiency, unspecified: Secondary | ICD-10-CM

## 2015-05-07 DIAGNOSIS — F32A Depression, unspecified: Secondary | ICD-10-CM

## 2015-05-07 DIAGNOSIS — E8881 Metabolic syndrome: Secondary | ICD-10-CM | POA: Diagnosis not present

## 2015-05-07 DIAGNOSIS — F329 Major depressive disorder, single episode, unspecified: Secondary | ICD-10-CM | POA: Diagnosis not present

## 2015-05-07 DIAGNOSIS — M25512 Pain in left shoulder: Secondary | ICD-10-CM

## 2015-05-07 DIAGNOSIS — K219 Gastro-esophageal reflux disease without esophagitis: Secondary | ICD-10-CM | POA: Diagnosis not present

## 2015-05-07 MED ORDER — PANTOPRAZOLE SODIUM 40 MG PO TBEC: 40.0000 mg | DELAYED_RELEASE_TABLET | Freq: Every day | ORAL | Status: DC

## 2015-05-07 MED ORDER — CITALOPRAM HYDROBROMIDE 20 MG PO TABS: 20.0000 mg | ORAL_TABLET | Freq: Every day | ORAL | Status: DC

## 2015-05-07 MED ORDER — CYCLOBENZAPRINE HCL 5 MG PO TABS: 5.0000 mg | ORAL_TABLET | Freq: Three times a day (TID) | ORAL | Status: DC | PRN

## 2015-05-07 MED ORDER — HYDROCHLOROTHIAZIDE 25 MG PO TABS: 25.0000 mg | ORAL_TABLET | Freq: Every day | ORAL | Status: DC

## 2015-05-07 MED ORDER — BENAZEPRIL HCL 40 MG PO TABS: 40.0000 mg | ORAL_TABLET | Freq: Every day | ORAL | Status: DC

## 2015-05-07 MED ORDER — CARVEDILOL 6.25 MG PO TABS: 6.2500 mg | ORAL_TABLET | Freq: Two times a day (BID) | ORAL | Status: DC

## 2015-05-07 NOTE — Patient Instructions (Addendum)
Health Maintenance, Female Adopting a healthy lifestyle and getting preventive care can go a long way to promote health and wellness. Talk with your health care provider about what schedule of regular examinations is right for you. This is a good chance for you to check in with your provider about disease prevention and staying healthy. In between checkups, there are plenty of things you can do on your own. Experts have done a lot of research about which lifestyle changes and preventive measures are most likely to keep you healthy. Ask your health care provider for more information. WEIGHT AND DIET  Eat a healthy diet  Be sure to include plenty of vegetables, fruits, low-fat dairy products, and lean protein.  Do not eat a lot of foods high in solid fats, added sugars, or salt.  Get regular exercise. This is one of the most important things you can do for your health.  Most adults should exercise for at least 150 minutes each week. The exercise should increase your heart rate and make you sweat (moderate-intensity exercise).  Most adults should also do strengthening exercises at least twice a week. This is in addition to the moderate-intensity exercise.  Maintain a healthy weight  Body mass index (BMI) is a measurement that can be used to identify possible weight problems. It estimates body fat based on height and weight. Your health care provider can help determine your BMI and help you achieve or maintain a healthy weight.  For females 74 years of age and older:   A BMI below 18.5 is considered underweight.  A BMI of 18.5 to 24.9 is normal.  A BMI of 25 to 29.9 is considered overweight.  A BMI of 30 and above is considered obese.  Watch levels of cholesterol and blood lipids  You should start having your blood tested for lipids and cholesterol at 55 years of age, then have this test every 5 years.  You may need to have your cholesterol levels checked more often if:  Your lipid  or cholesterol levels are high.  You are older than 55 years of age.  You are at high risk for heart disease.  CANCER SCREENING   Lung Cancer  Lung cancer screening is recommended for adults 18-14 years old who are at high risk for lung cancer because of a history of smoking.  A yearly low-dose CT scan of the lungs is recommended for people who:  Currently smoke.  Have quit within the past 15 years.  Have at least a 30-pack-year history of smoking. A pack year is smoking an average of one pack of cigarettes a day for 1 year.  Yearly screening should continue until it has been 15 years since you quit.  Yearly screening should stop if you develop a health problem that would prevent you from having lung cancer treatment.  Breast Cancer  Practice breast self-awareness. This means understanding how your breasts normally appear and feel.  It also means doing regular breast self-exams. Let your health care provider know about any changes, no matter how small.  If you are in your 20s or 30s, you should have a clinical breast exam (CBE) by a health care provider every 1-3 years as part of a regular health exam.  If you are 54 or older, have a CBE every year. Also consider having a breast X-ray (mammogram) every year.  If you have a family history of breast cancer, talk to your health care provider about genetic screening.  If you  are at high risk for breast cancer, talk to your health care provider about having an MRI and a mammogram every year.  Breast cancer gene (BRCA) assessment is recommended for women who have family members with BRCA-related cancers. BRCA-related cancers include:  Breast.  Ovarian.  Tubal.  Peritoneal cancers.  Results of the assessment will determine the need for genetic counseling and BRCA1 and BRCA2 testing. Cervical Cancer Your health care provider may recommend that you be screened regularly for cancer of the pelvic organs (ovaries, uterus, and  vagina). This screening involves a pelvic examination, including checking for microscopic changes to the surface of your cervix (Pap test). You may be encouraged to have this screening done every 3 years, beginning at age 57.  For women ages 64-65, health care providers may recommend pelvic exams and Pap testing every 3 years, or they may recommend the Pap and pelvic exam, combined with testing for human papilloma virus (HPV), every 5 years. Some types of HPV increase your risk of cervical cancer. Testing for HPV may also be done on women of any age with unclear Pap test results.  Other health care providers may not recommend any screening for nonpregnant women who are considered low risk for pelvic cancer and who do not have symptoms. Ask your health care provider if a screening pelvic exam is right for you.  If you have had past treatment for cervical cancer or a condition that could lead to cancer, you need Pap tests and screening for cancer for at least 20 years after your treatment. If Pap tests have been discontinued, your risk factors (such as having a new sexual partner) need to be reassessed to determine if screening should resume. Some women have medical problems that increase the chance of getting cervical cancer. In these cases, your health care provider may recommend more frequent screening and Pap tests. Colorectal Cancer  This type of cancer can be detected and often prevented.  Routine colorectal cancer screening usually begins at 55 years of age and continues through 55 years of age.  Your health care provider may recommend screening at an earlier age if you have risk factors for colon cancer.  Your health care provider may also recommend using home test kits to check for hidden blood in the stool.  A small camera at the end of a tube can be used to examine your colon directly (sigmoidoscopy or colonoscopy). This is done to check for the earliest forms of colorectal  cancer.  Routine screening usually begins at age 37.  Direct examination of the colon should be repeated every 5-10 years through 54 years of age. However, you may need to be screened more often if early forms of precancerous polyps or small growths are found. Skin Cancer  Check your skin from head to toe regularly.  Tell your health care provider about any new moles or changes in moles, especially if there is a change in a mole's shape or color.  Also tell your health care provider if you have a mole that is larger than the size of a pencil eraser.  Always use sunscreen. Apply sunscreen liberally and repeatedly throughout the day.  Protect yourself by wearing long sleeves, pants, a wide-brimmed hat, and sunglasses whenever you are outside. HEART DISEASE, DIABETES, AND HIGH BLOOD PRESSURE   High blood pressure causes heart disease and increases the risk of stroke. High blood pressure is more likely to develop in:  People who have blood pressure in the high end  of the normal range (130-139/85-89 mm Hg).  People who are overweight or obese.  People who are African American.  If you are 65-102 years of age, have your blood pressure checked every 3-5 years. If you are 67 years of age or older, have your blood pressure checked every year. You should have your blood pressure measured twice--once when you are at a hospital or clinic, and once when you are not at a hospital or clinic. Record the average of the two measurements. To check your blood pressure when you are not at a hospital or clinic, you can use:  An automated blood pressure machine at a pharmacy.  A home blood pressure monitor.  If you are between 26 years and 76 years old, ask your health care provider if you should take aspirin to prevent strokes.  Have regular diabetes screenings. This involves taking a blood sample to check your fasting blood sugar level.  If you are at a normal weight and have a low risk for diabetes,  have this test once every three years after 55 years of age.  If you are overweight and have a high risk for diabetes, consider being tested at a younger age or more often. PREVENTING INFECTION  Hepatitis B  If you have a higher risk for hepatitis B, you should be screened for this virus. You are considered at high risk for hepatitis B if:  You were born in a country where hepatitis B is common. Ask your health care provider which countries are considered high risk.  Your parents were born in a high-risk country, and you have not been immunized against hepatitis B (hepatitis B vaccine).  You have HIV or AIDS.  You use needles to inject street drugs.  You live with someone who has hepatitis B.  You have had sex with someone who has hepatitis B.  You get hemodialysis treatment.  You take certain medicines for conditions, including cancer, organ transplantation, and autoimmune conditions. Hepatitis C  Blood testing is recommended for:  Everyone born from [birth year] through 1966.  Anyone with known risk factors for hepatitis C. Sexually transmitted infections (STIs)  You should be screened for sexually transmitted infections (STIs) including gonorrhea and chlamydia if:  You are sexually active and are younger than 55 years of age.  You are older than 54 years of age and your health care provider tells you that you are at risk for this type of infection.  Your sexual activity has changed since you were last screened and you are at an increased risk for chlamydia or gonorrhea. Ask your health care provider if you are at risk.  If you do not have HIV, but are at risk, it may be recommended that you take a prescription medicine daily to prevent HIV infection. This is called pre-exposure prophylaxis (PrEP). You are considered at risk if:  You are sexually active and do not regularly use condoms or know the HIV status of your partner(s).  You take drugs by injection.  You are sexually  active with a partner who has HIV. Talk with your health care provider about whether you are at high risk of being infected with HIV. If you choose to begin PrEP, you should first be tested for HIV. You should then be tested every 3 months for as long as you are taking PrEP.  PREGNANCY   If you are premenopausal and you may become pregnant, ask your health care provider about preconception counseling.  If you may  become pregnant, take 400 to 800 micrograms (mcg) of folic acid every day.  If you want to prevent pregnancy, talk to your health care provider about birth control (contraception). OSTEOPOROSIS AND MENOPAUSE   Osteoporosis is a disease in which the bones lose minerals and strength with aging. This can result in serious bone fractures. Your risk for osteoporosis can be identified using a bone density scan.  If you are 22 years of age or older, or if you are at risk for osteoporosis and fractures, ask your health care provider if you should be screened.  Ask your health care provider whether you should take a calcium or vitamin D supplement to lower your risk for osteoporosis.  Menopause may have certain physical symptoms and risks.  Hormone replacement therapy may reduce some of these symptoms and risks. Talk to your health care provider about whether hormone replacement therapy is right for you.  HOME CARE INSTRUCTIONS   Schedule regular health, dental, and eye exams.  Stay current with your immunizations.   Do not use any tobacco products including cigarettes, chewing tobacco, or electronic cigarettes.  If you are pregnant, do not drink alcohol.  If you are breastfeeding, limit how much and how often you drink alcohol.  Limit alcohol intake to no more than 1 drink per day for nonpregnant women. One drink equals 12 ounces of beer, 5 ounces of wine, or 1 ounces of hard liquor.  Do not use street drugs.  Do not share needles.  Ask your health care provider for help if  you need support or information about quitting drugs.  Tell your health care provider if you often feel depressed.  Tell your health care provider if you have ever been abused or do not feel safe at home.   This information is not intended to replace advice given to you by your health care provider. Make sure you discuss any questions you have with your health care provider.   Document Released: 12/05/2010 Document Revised: 06/12/2014 Document Reviewed: 04/23/2013 Elsevier Interactive Patient Education 2016 [employer] Shoulder Pain The shoulder is the joint that connects your arms to your body. The bones that form the shoulder joint include the upper arm bone (humerus), the shoulder blade (scapula), and the collarbone (clavicle). The top of the humerus is shaped like a ball and fits into a rather flat socket on the scapula (glenoid cavity). A combination of muscles and strong, fibrous tissues that connect muscles to bones (tendons) support your shoulder joint and hold the ball in the socket. Small, fluid-filled sacs (bursae) are located in different areas of the joint. They act as cushions between the bones and the overlying soft tissues and help reduce friction between the gliding tendons and the bone as you move your arm. Your shoulder joint allows a wide range of motion in your arm. This range of motion allows you to do things like scratch your back or throw a ball. However, this range of motion also makes your shoulder more prone to pain from overuse and injury. Causes of shoulder pain can originate from both injury and overuse and usually can be grouped in the following four categories:  Redness, swelling, and pain (inflammation) of the tendon (tendinitis) or the bursae (bursitis).  Instability, such as a dislocation of the joint.  Inflammation of the joint (arthritis).  Broken bone (fracture). HOME CARE INSTRUCTIONS   Apply ice to the sore area.  Put ice in a plastic bag.  Place  a towel between your  skin and the bag.  Leave the ice on for 15-20 minutes, 3-4 times per day for the first 2 days, or as directed by your health care provider.  Stop using cold packs if they do not help with the pain.  If you have a shoulder sling or immobilizer, wear it as long as your caregiver instructs. Only remove it to shower or bathe. Move your arm as little as possible, but keep your hand moving to prevent swelling.  Squeeze a soft ball or foam pad as much as possible to help prevent swelling.  Only take over-the-counter or prescription medicines for pain, discomfort, or fever as directed by your caregiver. SEEK MEDICAL CARE IF:   Your shoulder pain increases, or new pain develops in your arm, hand, or fingers.  Your hand or fingers become cold and numb.  Your pain is not relieved with medicines. SEEK IMMEDIATE MEDICAL CARE IF:   Your arm, hand, or fingers are numb or tingling.  Your arm, hand, or fingers are significantly swollen or turn white or blue. MAKE SURE YOU:   Understand these instructions.  Will watch your condition.  Will get help right away if you are not doing well or get worse.   This information is not intended to replace advice given to you by your health care provider. Make sure you discuss any questions you have with your health care provider.   Document Released: 03/01/2005 Document Revised: 06/12/2014 Document Reviewed: 09/14/2014 Elsevier Interactive Patient Education [employer].

## 2015-05-07 NOTE — Progress Notes (Signed)
Subjective:    Patient ID: Jamie Mcgee, female    DOB: 05/13/1961, 54 y.o.   MRN: [id number]  Pt presents to the office today for chronic follow up.  Hypertension This is a chronic problem. The current episode started more than 1 year ago. The problem has been resolved since onset. The problem is controlled. Pertinent negatives include no anxiety, headaches, palpitations, peripheral edema or shortness of breath. Risk factors for coronary artery disease include obesity, post-menopausal state, sedentary lifestyle, smoking/tobacco exposure and family history. Past treatments include beta blockers and ACE inhibitors. The current treatment provides moderate improvement. There is no history of kidney disease, CAD/MI, CVA, heart failure or a thyroid problem. There is no history of sleep apnea.  Gastroesophageal Reflux She reports no belching or no heartburn. This is a chronic problem. The current episode started more than 1 year ago. The problem occurs rarely. The problem has been resolved. The symptoms are aggravated by certain foods. She has tried a PPI for the symptoms. The treatment provided moderate relief.  Shoulder Pain  The pain is present in the left shoulder. This is a new problem. The current episode started more than 1 month ago. The problem occurs constantly. The problem has been waxing and waning. The quality of the pain is described as aching. The pain is at a severity of 8/10. The pain is mild. Pertinent negatives include no joint locking, joint swelling, limited range of motion, numbness or tingling. She has tried NSAIDS for the symptoms. The treatment provided mild relief. Family history does not include gout.  Depression        This is a chronic problem.  The current episode started more than 1 year ago.   The onset quality is gradual.   The problem occurs intermittently.  The problem has been waxing and waning since onset.  Associated symptoms include no helplessness, no hopelessness,  no restlessness, no appetite change, no headaches and no suicidal ideas.  Past treatments include SSRIs - Selective serotonin reuptake inhibitors.  Compliance with treatment is good.   Pertinent negatives include no thyroid problem and no anxiety.     Review of Systems  Constitutional: Negative.  Negative for appetite change.  HENT: Negative.   Eyes: Negative.   Respiratory: Negative.  Negative for shortness of breath.   Cardiovascular: Negative.  Negative for palpitations.  Gastrointestinal: Negative.  Negative for heartburn.  Endocrine: Negative.   Genitourinary: Negative.   Musculoskeletal: Negative.   Neurological: Negative.  Negative for tingling, numbness and headaches.  Hematological: Negative.   Psychiatric/Behavioral: Positive for depression. Negative for suicidal ideas.  All other systems reviewed and are negative.      Objective:   Physical Exam  Constitutional: She is oriented to person, place, and time. She appears well-developed and well-nourished. No distress.  HENT:  Head: Normocephalic and atraumatic.  Right Ear: External ear normal.  Left Ear: External ear normal.  Nose: Nose normal.  Mouth/Throat: Oropharynx is clear and moist.  Eyes: Pupils are equal, round, and reactive to light.  Neck: Normal range of motion. Neck supple. No thyromegaly present.  Cardiovascular: Normal rate, regular rhythm, normal heart sounds and intact distal pulses.   No murmur heard. Pulmonary/Chest: Effort normal and breath sounds normal. No respiratory distress. She has no wheezes.  Abdominal: Soft. Bowel sounds are normal. She exhibits no distension. There is no tenderness.  Musculoskeletal: Normal range of motion. She exhibits no edema or tenderness.  Neurological: She is alert and oriented to person,  place, and time. She has normal reflexes. No cranial nerve deficit.  Skin: Skin is warm and dry.  Psychiatric: She has a normal mood and affect. Her behavior is normal. Judgment and  thought content normal.  Vitals reviewed.     BP 106/65 mmHg  Pulse 92  Temp(Src) 98.3 F (36.8 C) (Oral)  Ht 5' 4"  (1.626 m)  Wt 197 lb 3.2 oz (89.449 kg)  BMI 33.83 kg/m2  LMP 03/25/2011     Assessment & Plan:  1. Depressed - CMP14+EGFR - citalopram (CELEXA) 20 MG tablet; Take 1 tablet (20 mg total) by mouth daily.  Dispense: 90 tablet; Refill: 1  2. Essential hypertension, benign - CMP14+EGFR - benazepril (LOTENSIN) 40 MG tablet; Take 1 tablet (40 mg total) by mouth daily.  Dispense: 90 tablet; Refill: 1 - hydrochlorothiazide (HYDRODIURIL) 25 MG tablet; Take 1 tablet (25 mg total) by mouth daily.  Dispense: 90 tablet; Refill: 2  3. Gastroesophageal reflux disease, esophagitis presence not specified - CMP14+EGFR - Anemia Profile B - pantoprazole (PROTONIX) 40 MG tablet; Take 1 tablet (40 mg total) by mouth daily at 6 (six) AM.  Dispense: 90 tablet; Refill: 1  4. Vitamin D deficiency - CMP14+EGFR - VITAMIN D 25 Hydroxy (Vit-D Deficiency, Fractures)  5. Essential hypertension - CMP14+EGFR - carvedilol (COREG) 6.25 MG tablet; Take 1 tablet (6.25 mg total) by mouth 2 (two) times daily with a meal.  Dispense: 180 tablet; Refill: 1  6. Metabolic syndrome - UYQ03+KVQQ  7. Need for hepatitis C screening test - CMP14+EGFR - Hepatitis C antibody  8. Left shoulder pain -Rest -Ice - cyclobenzaprine (FLEXERIL) 5 MG tablet; Take 1 tablet (5 mg total) by mouth 3 (three) times daily as needed for muscle spasms.  Dispense: 60 tablet; Refill: 0  9. Post-menopausal bleeding -Pt to stop Prempro since bleeding has stopped for last three months -If bleeding restarts we will restart medication Pt has family history of  Breast cancer, but denies any family or personal history of cervical or endometrial cancer.    Continue all meds Labs pending Health Maintenance reviewed Diet and exercise encouraged RTO 6 months  Evelina Dun, FNP

## 2015-05-08 LAB — ANEMIA PROFILE B
Basophils Absolute: 0.1 10*3/uL (ref 0.0–0.2)
Basos: 1 %
EOS (ABSOLUTE): 0.3 10*3/uL (ref 0.0–0.4)
EOS: 3 %
FERRITIN: 248 ng/mL — AB (ref 15–150)
FOLATE: 15.7 ng/mL (ref >3.0)
HEMOGLOBIN: 14 g/dL (ref 11.1–15.9)
Hematocrit: 41.8 % (ref 34.0–46.6)
Immature Grans (Abs): 0.1 10*3/uL (ref 0.0–0.1)
Immature Granulocytes: 1 %
Iron Saturation: 61 % — ABNORMAL HIGH (ref 15–55)
Iron: 150 ug/dL (ref 27–159)
LYMPHS ABS: 2.9 10*3/uL (ref 0.7–3.1)
LYMPHS: 32 %
MCH: 30.7 pg (ref 26.6–33.0)
MCHC: 33.5 g/dL (ref 31.5–35.7)
MCV: 92 fL (ref 79–97)
MONOS ABS: 0.6 10*3/uL (ref 0.1–0.9)
Monocytes: 6 %
NEUTROS PCT: 57 %
Neutrophils Absolute: 5.1 10*3/uL (ref 1.4–7.0)
PLATELETS: 339 10*3/uL (ref 150–379)
RBC: 4.56 x10E6/uL (ref 3.77–5.28)
RDW: 13 % (ref 12.3–15.4)
Retic Ct Pct: 1.5 % (ref 0.6–2.6)
TIBC: 245 ug/dL — AB (ref 250–450)
UIBC: 95 ug/dL — AB (ref 131–425)
VITAMIN B 12: 291 pg/mL (ref 211–946)
WBC: 9 10*3/uL (ref 3.4–10.8)

## 2015-05-08 LAB — CMP14+EGFR
A/G RATIO: 1.6 (ref 1.1–2.5)
ALK PHOS: 84 IU/L (ref 39–117)
ALT: 24 IU/L (ref 0–32)
AST: 19 IU/L (ref 0–40)
Albumin: 3.9 g/dL (ref 3.5–5.5)
BILIRUBIN TOTAL: 0.3 mg/dL (ref 0.0–1.2)
BUN/Creatinine Ratio: 13 (ref 9–23)
BUN: 11 mg/dL (ref 6–24)
CHLORIDE: 97 mmol/L (ref 97–106)
CO2: 25 mmol/L (ref 18–29)
Calcium: 9 mg/dL (ref 8.7–10.2)
Creatinine, Ser: 0.88 mg/dL (ref 0.57–1.00)
GFR calc Af Amer: 86 mL/min/{1.73_m2} (ref >59)
GFR calc non Af Amer: 74 mL/min/{1.73_m2} (ref >59)
GLUCOSE: 116 mg/dL — AB (ref 65–99)
Globulin, Total: 2.4 g/dL (ref 1.5–4.5)
POTASSIUM: 4.2 mmol/L (ref 3.5–5.2)
Sodium: 138 mmol/L (ref 136–144)
Total Protein: 6.3 g/dL (ref 6.0–8.5)

## 2015-05-08 LAB — VITAMIN D 25 HYDROXY (VIT D DEFICIENCY, FRACTURES): Vit D, 25-Hydroxy: 37 ng/mL (ref 30.0–100.0)

## 2015-05-08 LAB — HEPATITIS C ANTIBODY

## 2015-05-10 NOTE — Progress Notes (Signed)
PATIENT AWARE

## 2015-06-04 ENCOUNTER — Encounter: Payer: Self-pay | Admitting: Family

## 2015-06-04 ENCOUNTER — Ambulatory Visit (INDEPENDENT_AMBULATORY_CARE_PROVIDER_SITE_OTHER): Payer: BLUE CROSS/BLUE SHIELD | Admitting: Family

## 2015-06-04 VITALS — BP 112/76 | HR 100 | Temp 97.0°F | Ht 64.0 in | Wt 205.0 lb

## 2015-06-04 DIAGNOSIS — Z803 Family history of malignant neoplasm of breast: Secondary | ICD-10-CM

## 2015-06-04 DIAGNOSIS — M25512 Pain in left shoulder: Secondary | ICD-10-CM | POA: Diagnosis not present

## 2015-06-04 DIAGNOSIS — N939 Abnormal uterine and vaginal bleeding, unspecified: Secondary | ICD-10-CM

## 2015-06-04 DIAGNOSIS — Z8049 Family history of malignant neoplasm of other genital organs: Secondary | ICD-10-CM | POA: Diagnosis not present

## 2015-06-04 NOTE — Patient Instructions (Signed)

## 2015-06-04 NOTE — Progress Notes (Signed)
   Subjective:    Patient ID: Jamie Mcgee, female    DOB: 25-Apr-1961, 54 y.o.   MRN: [id number]  Pt presents to the office today with recurrent left shoulder pain and abnormal uterine bleeding. Pt states she had the uterine bleeding start in May 2016. PT has a transvaginal ultrasound that was negative. PT was then started on Prempro for 3 months. The bleeding stopped, but pt's mother had breast and cervical cancer and maternal grandmother had breast cancer.  Shoulder Pain  The pain is present in the left shoulder. This is a new problem. The current episode started more than 1 month ago. There has been no history of extremity trauma. The problem occurs constantly. The problem has been waxing and waning. The quality of the pain is described as aching. The pain is at a severity of 8/10. The pain is moderate. Associated symptoms include an inability to bear weight, a limited range of motion and stiffness. Pertinent negatives include no numbness or tingling. She has tried NSAIDS and rest for the symptoms. The treatment provided mild relief. Family history does not include gout.      Review of Systems  Constitutional: Negative.   HENT: Negative.   Eyes: Negative.   Respiratory: Negative.  Negative for shortness of breath.   Cardiovascular: Negative.  Negative for palpitations.  Gastrointestinal: Negative.   Endocrine: Negative.   Genitourinary: Negative.   Musculoskeletal: Positive for stiffness.  Neurological: Negative.  Negative for tingling, numbness and headaches.  Hematological: Negative.   Psychiatric/Behavioral: Negative.   All other systems reviewed and are negative.      Objective:   Physical Exam  Constitutional: She is oriented to person, place, and time. She appears well-developed and well-nourished. No distress.  HENT:  Head: Normocephalic and atraumatic.  Eyes: Pupils are equal, round, and reactive to light.  Neck: Normal range of motion. Neck supple. No thyromegaly  present.  Cardiovascular: Normal rate, regular rhythm, normal heart sounds and intact distal pulses.   No murmur heard. Pulmonary/Chest: Effort normal and breath sounds normal. No respiratory distress. She has no wheezes.  Abdominal: Soft. Bowel sounds are normal. She exhibits no distension. There is no tenderness.  Musculoskeletal: She exhibits no edema or tenderness.  Neurological: She is alert and oriented to person, place, and time. She has normal reflexes. No cranial nerve deficit.  Skin: Skin is warm and dry.  Psychiatric: She has a normal mood and affect. Her behavior is normal. Judgment and thought content normal.  Vitals reviewed.     BP 112/76 mmHg  Pulse 100  Temp(Src) 97 F (36.1 C) (Oral)  Ht 5\' 4"  (1.626 m)  Wt 205 lb (92.987 kg)  BMI 35.17 kg/m2  LMP 03/25/2011     Assessment & Plan:  1. Abnormal uterine bleeding - Ambulatory referral to Gynecology  2. Family history of breast cancer - Ambulatory referral to Gynecology  3. Family history of cervical cancer - Ambulatory referral to Gynecology  4. Left shoulder pain - Ambulatory referral to Orthopedic Surgery   Continue all meds Health Maintenance reviewed Diet and exercise encouraged RTO as needed  Evelina Dun, FNP

## 2015-06-16 ENCOUNTER — Telehealth (HOSPITAL_COMMUNITY): Payer: Self-pay | Admitting: Specialist

## 2015-06-16 NOTE — Telephone Encounter (Signed)
Patient called to say she had l/m for [place] to call her back last night -after that she call the [facility] on [address] and they are doing her OT. She thanks [place] for calling her back. NF 06/16/15

## 2015-06-18 ENCOUNTER — Other Ambulatory Visit: Payer: Self-pay | Admitting: Obstetrics and Gynecology

## 2015-06-18 ENCOUNTER — Ambulatory Visit (INDEPENDENT_AMBULATORY_CARE_PROVIDER_SITE_OTHER): Payer: BLUE CROSS/BLUE SHIELD | Admitting: Obstetrics and Gynecology

## 2015-06-18 ENCOUNTER — Encounter: Payer: Self-pay | Admitting: Obstetrics and Gynecology

## 2015-06-18 VITALS — BP 100/60 | Ht 65.0 in | Wt 199.5 lb

## 2015-06-18 DIAGNOSIS — N950 Postmenopausal bleeding: Secondary | ICD-10-CM

## 2015-06-18 NOTE — Patient Instructions (Signed)
Endometrial Biopsy Endometrial biopsy is a procedure in which a tissue sample is taken from inside the uterus. The tissue sample is then looked at under a microscope to see if the tissue is normal or abnormal. The endometrium is the lining of the uterus. This procedure helps determine where you are in your menstrual cycle and how hormone levels are affecting the lining of the uterus. This procedure may also be used to evaluate uterine bleeding or to diagnose endometrial cancer, tuberculosis, polyps, or inflammatory conditions.  LET YOUR HEALTH CARE PROVIDER KNOW ABOUT:  Any allergies you have.  All medicines you are taking, including vitamins, herbs, eye drops, creams, and over-the-counter medicines.  Previous problems you or members of your family have had with the use of anesthetics.  Any blood disorders you have.  Previous surgeries you have had.  Medical conditions you have.  Possibility of pregnancy. RISKS AND COMPLICATIONS Generally, this is a safe procedure. However, as with any procedure, complications can occur. Possible complications include:  Bleeding.  Pelvic infection.  Puncture of the uterine wall with the biopsy device (rare). BEFORE THE PROCEDURE   Keep a record of your menstrual cycles as directed by your health care provider. You may need to schedule your procedure for a specific time in your cycle.  You may want to bring a sanitary pad to wear home after the procedure.  Arrange for someone to drive you home after the procedure if you will be given a medicine to help you relax (sedative). PROCEDURE   You may be given a sedative to relax you.  You will lie on an exam table with your feet and legs supported as in a pelvic exam.  Your health care provider will insert an instrument (speculum) into your vagina to see your cervix.  Your cervix will be cleansed with an antiseptic solution. A medicine (local anesthetic) will be used to numb the cervix.  A forceps  instrument (tenaculum) will be used to hold your cervix steady for the biopsy.  A thin, rodlike instrument (uterine sound) will be inserted through your cervix to determine the length of your uterus and the location where the biopsy sample will be removed.  A thin, flexible tube (catheter) will be inserted through your cervix and into the uterus. The catheter is used to collect the biopsy sample from your endometrial tissue.  The catheter and speculum will then be removed, and the tissue sample will be sent to a lab for examination. AFTER THE PROCEDURE  You will rest in a recovery area until you are ready to go home.  You may have mild cramping and a small amount of vaginal bleeding for a few days after the procedure. This is normal.  Make sure you find out how to get your test results.   This information is not intended to replace advice given to you by your health care provider. Make sure you discuss any questions you have with your health care provider.   Document Released: 09/22/2004 Document Revised: 01/22/2013 Document Reviewed: 11/06/2012 Elsevier Interactive Patient Education 2016 [employer]  

## 2015-06-18 NOTE — Progress Notes (Signed)
Patient ID: Jamie Mcgee, female   DOB: Feb 27, 1961, 55 y.o.   MRN: [id number] Pt here today for PMB. Pt states that she has not had a period for 3 years, for the past 6 moths she has had spotting. Pt was given Prempro and the bleeding stopped. Pt now has spotting 1-2 out of the week and she only has the spotting when wiping, not enough to wear a pad. Pt did have an [place] in June or July of 2016 and was told that she had a small cyst on an ovary but there were no other findings. Pt states that she does have cramping. Pt states that she was also advised to ask about genetic testing for breast and cervical cancer.

## 2015-06-18 NOTE — Progress Notes (Signed)
[facility] Visit  Patient name: Jamie Mcgee MRN [id number]  Date of birth: [birth year]  CC & HPI:  Jamie Mcgee is a 55 y.o. female presenting today for post menopausal bleeding She is referred from [place], and had no period in 3 yrs. Before having bleeding episode. U/s showed a thin endometrium, and pt was placed on PRemPro with recurrent irregular bleeding while taking it.    ROS:  U/S reviewed.   Pertinent History Reviewed:   Reviewed: Significant for postmenopausal status x 79yr.   Medical         Past Medical History  Diagnosis Date  . HTN (hypertension)   . Depression     hx of  . 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 Cataract extraction  08-2010    left   . Tubal ligation  1984    [facility]  . Renal artery angioplasty  2006    right  . Cataract extraction w/phaco  06/01/2011    Procedure: CATARACT EXTRACTION PHACO AND INTRAOCULAR LENS PLACEMENT (IOC);  Surgeon: Tonny Branch;  Location: [facility];  Service: Ophthalmology;  Laterality: Right;  CDE:9.62  . Appendectomy  1993   Medications: Reviewed & Updated - see associated section                       Current outpatient prescriptions:  .  aspirin EC 81 MG EC tablet, Take 1 tablet (81 mg total) by mouth daily., Disp: 100 tablet, Rfl: 1 .  benazepril (LOTENSIN) 40 MG tablet, Take 1 tablet (40 mg total) by mouth daily., Disp: 90 tablet, Rfl: 1 .  carvedilol (COREG) 6.25 MG tablet, Take 1 tablet (6.25 mg total) by mouth 2 (two) times daily with a meal., Disp: 180 tablet, Rfl: 1 .  cholecalciferol (VITAMIN D) 1000 UNITS tablet, Take 2 Units by mouth daily. , Disp: , Rfl:  .  citalopram (CELEXA) 20 MG tablet, Take 1 tablet (20 mg total) by mouth daily., Disp: 90 tablet, Rfl: 1 .  DUEXIS 800-26.6 MG TABS, Take 1 tablet by mouth 3 (three) times daily., Disp: , Rfl: 0 .   hydrochlorothiazide (HYDRODIURIL) 25 MG tablet, Take 1 tablet (25 mg total) by mouth daily., Disp: 90 tablet, Rfl: 2 .  Krill Oil 1000 MG CAPS, Take 1,000 mg by mouth daily., Disp: , Rfl:  .  pantoprazole (PROTONIX) 40 MG tablet, Take 1 tablet (40 mg total) by mouth daily at 6 (six) AM., Disp: 90 tablet, Rfl: 1 .  vitamin C (ASCORBIC ACID) 500 MG tablet, Take 500 mg by mouth daily., Disp: , Rfl:  .  nitroGLYCERIN (NITROSTAT) 0.4 MG SL tablet, Place 1 tablet (0.4 mg total) under the tongue every 5 (five) minutes as needed. (Patient not taking: Reported on 06/18/2015), Disp: 25 tablet, Rfl: 3   Social History: Reviewed -  reports that she quit smoking about 2 months ago. Her smoking use included Cigarettes. She has a 30 pack-year smoking history. She has never used smokeless tobacco.  Objective Findings:  Vitals: Blood pressure 100/60, height 5\' 5"  (1.651 m), weight 199 lb 8 oz (90.493 kg), last menstrual period 03/25/2011.  Physical Examination: General appearance - alert, well appearing, and in  no distress, oriented to person, place, and time and overweight Mental status - alert, oriented to person, place, and time, normal mood, behavior, speech, dress, motor activity, and thought processes Chest - clear to auscultation, no wheezes, rales or rhonchi, symmetric air entry Abdomen - soft, nontender, nondistended, no masses or organomegaly Pelvic - normal external genitalia, vulva, vagina, cervix, uterus and adnexa, good support, firm tight cervix Extremities - peripheral pulses normal, no pedal edema, no clubbing or cyanosis   Assessment & Plan:   A:  1. Postmenopausal bleeding P . 1 endometrial biopsy recommended and accepted for today.  Endometrial Biopsy: Patient given informed consent, signed copy in the chart, time out was performed. Time out taken. The patient was placed in the lithotomy position and the cervix brought into view with sterile speculum.  Portio of cervix cleansed x 2 with  betadine swabs.  A tenaculum was placed in the anterior lip of the cervix. The uterus was sounded for depth of 8 cm,. Milex uterine Explora 3 mm was introduced to into the uterus, suction created,  and an endometrial sample was obtained. All equipment was removed and accounted for.   The patient tolerated the procedure well, with significant discomfort of procedure, relief with minutes.   Patient given post procedure instructions.  Followup: 2 wk results.   P:  1. Results by MyChart 2. appt 2 wk for discussion of next steps/

## 2015-07-02 ENCOUNTER — Ambulatory Visit (INDEPENDENT_AMBULATORY_CARE_PROVIDER_SITE_OTHER): Payer: BLUE CROSS/BLUE SHIELD | Admitting: Obstetrics and Gynecology

## 2015-07-02 ENCOUNTER — Encounter: Payer: Self-pay | Admitting: Obstetrics and Gynecology

## 2015-07-02 VITALS — BP 104/60 | Ht 65.0 in | Wt 201.5 lb

## 2015-07-02 DIAGNOSIS — R252 Cramp and spasm: Secondary | ICD-10-CM | POA: Insufficient documentation

## 2015-07-02 DIAGNOSIS — N950 Postmenopausal bleeding: Secondary | ICD-10-CM

## 2015-07-02 NOTE — Progress Notes (Signed)
Patient ID: Jamie Mcgee, female   DOB: [birth year], 54 y.o.   MRN: [id number]   [facility] Visit  Patient name: Jamie Mcgee MRN [id number]  Date of birth: 1961-03-05  CC & HPI:  Jamie Mcgee is a 55 y.o. female presenting today for results of her Endometrial Biopsy completed on 06/18/15. The final diagnosis of the endometrium biopsy showed: (1) mucus and scant strips of inactive endometrium and (2) no malignancy was identified.   As a secondary matter, pt wishes to have her potassium levels checked. Pt complains of atraumatic, intermittent, stabbing, muscle aches and pains of the BLE which she believes may be due to her potassium levels., as she has recently started a thiazide diuretic  ROS:  10 Systems reviewed and all are negative for acute change except as noted in the HPI. Pertinent History Reviewed:   Reviewed: Significant for HTN, tubal ligation, appendectomy.  Medical         Past Medical History  Diagnosis Date  . HTN (hypertension)   . Depression     hx of  . 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 Cataract extraction  08-2010    left   . Tubal ligation  1984    [facility]  . Renal artery angioplasty  2006    right  . Cataract extraction w/phaco  06/01/2011    Procedure: CATARACT EXTRACTION PHACO AND INTRAOCULAR LENS PLACEMENT (IOC);  Surgeon: Tonny Branch;  Location: [facility];  Service: Ophthalmology;  Laterality: Right;  CDE:9.62  . Appendectomy  1993   Medications: Reviewed & Updated - see associated section                       Current outpatient prescriptions:  .  aspirin EC 81 MG EC tablet, Take 1 tablet (81 mg total) by mouth daily., Disp: 100 tablet, Rfl: 1 .  benazepril (LOTENSIN) 40 MG tablet, Take 1 tablet (40 mg total) by mouth daily., Disp: 90 tablet, Rfl: 1 .  carvedilol (COREG) 6.25 MG tablet, Take 1 tablet (6.25 mg total) by mouth 2  (two) times daily with a meal., Disp: 180 tablet, Rfl: 1 .  cholecalciferol (VITAMIN D) 1000 UNITS tablet, Take 2 Units by mouth daily. , Disp: , Rfl:  .  citalopram (CELEXA) 20 MG tablet, Take 1 tablet (20 mg total) by mouth daily., Disp: 90 tablet, Rfl: 1 .  DUEXIS 800-26.6 MG TABS, Take 1 tablet by mouth 3 (three) times daily., Disp: , Rfl: 0 .  hydrochlorothiazide (HYDRODIURIL) 25 MG tablet, Take 1 tablet (25 mg total) by mouth daily., Disp: 90 tablet, Rfl: 2 .  Krill Oil 1000 MG CAPS, Take 1,000 mg by mouth daily., Disp: , Rfl:  .  nitroGLYCERIN (NITROSTAT) 0.4 MG SL tablet, Place 1 tablet (0.4 mg total) under the tongue every 5 (five) minutes as needed. (Patient not taking: Reported on 06/18/2015), Disp: 25 tablet, Rfl: 3 .  pantoprazole (PROTONIX) 40 MG tablet, Take 1 tablet (40 mg total) by mouth daily at 6 (six) AM., Disp: 90 tablet, Rfl: 1 .  vitamin C (ASCORBIC ACID) 500 MG tablet, Take 500 mg by mouth daily., Disp: , Rfl:   Social History: Reviewed -  reports that  she quit smoking about 3 months ago. Her smoking use included Cigarettes. She has a 30 pack-year smoking history. She has never used smokeless tobacco.  Objective Findings:  Vitals: Blood pressure 104/60, height 5\' 5"  (1.651 m), weight 201 lb 8 oz (91.4 kg), last menstrual period 03/25/2011.  Physical Examination: Pt presents for discussion only.  Discussed with pt endometrial biopsy results. At end of discussion, pt had opportunity to ask questions and has no further questions at this time. Pt, however, requested a potassium check.  Greater than 50% was spent in counseling and coordination of care with the patient. Total time greater than: 6 minutes.   Assessment & Plan:   A: Hypertension controlled. 1. Post menopausal bleeding,resolved, benign endometrium 2. Leg cramps, rule out hypokalemia   P:  1. Serum Potassium , results in Mychart.  2. F/u PRN  By signing my name below, I, Terressa Koyanagi, attest that this  documentation has been prepared under the direction and in the presence of Mallory Shirk, MD. Electronically Signed: Terressa Koyanagi, ED Scribe. 07/02/2015. 10:12 AM I personally performed the services described in this documentation, which was SCRIBED in my presence. The recorded information has been reviewed and considered accurate. It has been edited as necessary during review. Jonnie Kind, MD

## 2015-07-02 NOTE — Progress Notes (Signed)
Patient ID: Jamie Mcgee, female   DOB: 07-13-1960, 55 y.o.   MRN: [id number] Pt here today for results.

## 2015-07-03 LAB — POTASSIUM: Potassium: 4.2 mmol/L (ref 3.5–5.2)

## 2015-07-29 ENCOUNTER — Ambulatory Visit (INDEPENDENT_AMBULATORY_CARE_PROVIDER_SITE_OTHER): Payer: BLUE CROSS/BLUE SHIELD | Admitting: Family Medicine

## 2015-07-29 ENCOUNTER — Encounter: Payer: Self-pay | Admitting: Family Medicine

## 2015-07-29 VITALS — BP 115/77 | HR 98 | Temp 102.2°F | Ht 64.0 in | Wt 204.0 lb

## 2015-07-29 DIAGNOSIS — J111 Influenza due to unidentified influenza virus with other respiratory manifestations: Secondary | ICD-10-CM

## 2015-07-29 DIAGNOSIS — R52 Pain, unspecified: Secondary | ICD-10-CM

## 2015-07-29 LAB — POCT INFLUENZA A/B
INFLUENZA A, POC: NEGATIVE
INFLUENZA B, POC: NEGATIVE

## 2015-07-29 MED ORDER — OSELTAMIVIR PHOSPHATE 75 MG PO CAPS: 75.0000 mg | ORAL_CAPSULE | Freq: Two times a day (BID) | ORAL | Status: DC

## 2015-07-29 NOTE — Progress Notes (Signed)
BP 115/77 mmHg  Pulse 98  Temp(Src) 102.2 F (39 C) (Oral)  Ht 5\' 4"  (1.626 m)  Wt 204 lb (92.534 kg)  BMI 35.00 kg/m2  LMP 03/25/2011   Subjective:    Patient ID: Jamie Mcgee, female    DOB: [birth year], 55 y.o.   MRN: [id number]  HPI: Jamie Mcgee is a 55 y.o. female presenting on 07/29/2015 for Generalized Body Aches; Fever; Sore Throat; and Headache   HPI Fevers aches and body chills and sore throat Patient has been having fever and aches and chills and sore throat since yesterday. Her fever has been as high as 102. She denies any shortness of breath or wheezing. She does have a frontal headache along with that. She denies any sick contacts that she knows of. She has been using Tylenol and ibuprofen. She does work in [employer] those so she has a lot of sick contacts that she doesn't know of.  Relevant past medical, surgical, family and social history reviewed and updated as indicated. Interim medical history since our last visit reviewed. Allergies and medications reviewed and updated.  Review of Systems  Constitutional: Positive for fever and chills.  HENT: Positive for congestion, postnasal drip, rhinorrhea, sinus pressure, sneezing and sore throat. Negative for ear discharge and ear pain.   Eyes: Negative for pain, redness and visual disturbance.  Respiratory: Positive for cough. Negative for chest tightness and shortness of breath.   Cardiovascular: Negative for chest pain and leg swelling.  Genitourinary: Negative for dysuria and difficulty urinating.  Musculoskeletal: Positive for myalgias. Negative for back pain and gait problem.  Skin: Negative for rash.  Neurological: Negative for light-headedness and headaches.  Psychiatric/Behavioral: Negative for behavioral problems and agitation.  All other systems reviewed and are negative.   Per HPI unless specifically indicated above     Medication List       This list is accurate as of: 07/29/15 10:23 AM.   Always use your most recent med list.               aspirin 81 MG EC tablet  Take 1 tablet (81 mg total) by mouth daily.     benazepril 40 MG tablet  Commonly known as:  LOTENSIN  Take 1 tablet (40 mg total) by mouth daily.     carvedilol 6.25 MG tablet  Commonly known as:  COREG  Take 1 tablet (6.25 mg total) by mouth 2 (two) times daily with a meal.     cholecalciferol 1000 units tablet  Commonly known as:  VITAMIN D  Take 2 Units by mouth daily.     citalopram 20 MG tablet  Commonly known as:  CELEXA  Take 1 tablet (20 mg total) by mouth daily.     DUEXIS 800-26.6 MG Tabs  Generic drug:  Ibuprofen-Famotidine  Take 1 tablet by mouth 3 (three) times daily.     hydrochlorothiazide 25 MG tablet  Commonly known as:  HYDRODIURIL  Take 1 tablet (25 mg total) by mouth daily.     Krill Oil 1000 MG Caps  Take 1,000 mg by mouth daily.     nitroGLYCERIN 0.4 MG SL tablet  Commonly known as:  NITROSTAT  Place 1 tablet (0.4 mg total) under the tongue every 5 (five) minutes as needed.     pantoprazole 40 MG tablet  Commonly known as:  PROTONIX  Take 1 tablet (40 mg total) by mouth daily at 6 (six) AM.     vitamin  C 500 MG tablet  Commonly known as:  ASCORBIC ACID  Take 500 mg by mouth daily.           Objective:    BP 115/77 mmHg  Pulse 98  Temp(Src) 102.2 F (39 C) (Oral)  Ht 5\' 4"  (1.626 m)  Wt 204 lb (92.534 kg)  BMI 35.00 kg/m2  LMP 03/25/2011  Wt Readings from Last 3 Encounters:  07/29/15 204 lb (92.534 kg)  07/02/15 201 lb 8 oz (91.4 kg)  06/18/15 199 lb 8 oz (90.493 kg)    Physical Exam  Constitutional: She is oriented to person, place, and time. She appears well-developed and well-nourished. No distress.  HENT:  Right Ear: Tympanic membrane, external ear and ear canal normal.  Left Ear: Tympanic membrane, external ear and ear canal normal.  Nose: Mucosal edema and rhinorrhea present. No epistaxis. Right sinus exhibits no maxillary sinus tenderness  and no frontal sinus tenderness. Left sinus exhibits no maxillary sinus tenderness and no frontal sinus tenderness.  Mouth/Throat: Uvula is midline and mucous membranes are normal. Posterior oropharyngeal edema and posterior oropharyngeal erythema present. No oropharyngeal exudate or tonsillar abscesses.  Eyes: Conjunctivae and EOM are normal.  Neck: Neck supple. No thyromegaly present.  Cardiovascular: Normal rate, regular rhythm, normal heart sounds and intact distal pulses.   No murmur heard. Pulmonary/Chest: Effort normal and breath sounds normal. No respiratory distress. She has no wheezes. She has no rales.  Musculoskeletal: Normal range of motion. She exhibits no edema or tenderness.  Lymphadenopathy:    She has no cervical adenopathy.  Neurological: She is alert and oriented to person, place, and time. Coordination normal.  Skin: Skin is warm and dry. No rash noted. She is not diaphoretic.  Psychiatric: She has a normal mood and affect. Her behavior is normal.  Nursing note and vitals reviewed.   Results for orders placed or performed in visit on 07/29/15  POCT Influenza A/B  Result Value Ref Range   Influenza A, POC Negative Negative   Influenza B, POC Negative Negative      Assessment & Plan:   Problem List Items Addressed This Visit    None    Visit Diagnoses    Body aches    -  Primary    Relevant Orders    POCT Influenza A/B (Completed)    Influenza with respiratory manifestation        Looks and sounds like the flu, even though flu was negative, will treat like flu       Follow up plan: Return if symptoms worsen or fail to improve.  Counseling provided for all of the vaccine components Orders Placed This Encounter  Procedures  . POCT Influenza A/B    Caryl Pina, MD [facility] 07/29/2015, 10:23 AM

## 2015-08-06 ENCOUNTER — Ambulatory Visit: Payer: BLUE CROSS/BLUE SHIELD | Admitting: Cardiovascular Disease

## 2015-08-17 ENCOUNTER — Ambulatory Visit (INDEPENDENT_AMBULATORY_CARE_PROVIDER_SITE_OTHER): Payer: BLUE CROSS/BLUE SHIELD | Admitting: Pediatrics

## 2015-08-17 ENCOUNTER — Encounter: Payer: Self-pay | Admitting: Pediatrics

## 2015-08-17 VITALS — BP 95/69 | HR 70 | Temp 97.2°F | Ht 64.0 in | Wt 205.8 lb

## 2015-08-17 DIAGNOSIS — K649 Unspecified hemorrhoids: Secondary | ICD-10-CM | POA: Diagnosis not present

## 2015-08-17 MED ORDER — HYDROCORTISONE ACETATE 25 MG RE SUPP: 25.0000 mg | Freq: Two times a day (BID) | RECTAL | Status: DC

## 2015-08-17 NOTE — Progress Notes (Signed)
    Subjective:    Patient ID: Jamie Mcgee, female    DOB: [birth year], 55 y.o.   MRN: [id number]  CC: Rectal Bleeding   HPI: ROSLAND Mcgee is a 55 y.o. female presenting for Rectal Bleeding  Has had hemorrhoids before Says has been having plaster consistency stools Taking colon cleaners daily Had two episodes of BRBPR past few days Normal stooling since then, brown no melana   Relevant past medical, surgical, family and social history reviewed and updated as indicated. Interim medical history since our last visit reviewed. Allergies and medications reviewed and updated.    ROS: Per HPI unless specifically indicated above  History  Smoking status  . Former Smoker -- 1.00 packs/day for 30 years  . Types: Cigarettes  . Quit date: 03/22/2015  Smokeless tobacco  . Never Used    Past Medical History Patient Active Problem List   Diagnosis Date Noted  . Cramp of both lower extremities 07/02/2015  . PMB (postmenopausal bleeding) 06/18/2015  . GERD (gastroesophageal reflux disease) 05/07/2015  . Vitamin D deficiency 05/07/2015  . Metabolic syndrome [id number]  . Post-menopausal bleeding 05/07/2015  . Chest pain, atypical 03/08/2015  . Tobacco abuse 03/08/2015  . Atypical chest pain 03/08/2015  . Depressed 12/19/2013  . Essential hypertension, benign 12/19/2013        Objective:    BP 95/69 mmHg  Pulse 70  Temp(Src) 97.2 F (36.2 C) (Oral)  Ht 5\' 4"  (1.626 m)  Wt 205 lb 12.8 oz (93.35 kg)  BMI 35.31 kg/m2  LMP 03/25/2011  Wt Readings from Last 3 Encounters:  08/17/15 205 lb 12.8 oz (93.35 kg)  07/29/15 204 lb (92.534 kg)  07/02/15 201 lb 8 oz (91.4 kg)     Gen: NAD, alert, cooperative with exam, NCAT EYES: EOMI, no scleral injection or icterus ENT:  TMs pearly gray b/l, OP without erythema LYMPH: no cervical LAD CV: NRRR, normal S1/S2, no murmur, distal pulses 2+ b/l Resp: CTABL, no wheezes, normal WOB Abd: +BS, soft, NTND. no guarding or  organomegaly Ext: No edema, warm Neuro: Alert and oriented, strength equal b/l UE and LE, coordination grossly normal MSK: normal muscle bulk Rectal: several soft external hemorrhoids present, no bleeding     Assessment & Plan:    [name] was seen today for rectal bleeding.  Diagnoses and all orders for this visit:  Hemorrhoids, unspecified hemorrhoid type -     CBC -     Ambulatory referral to Gastroenterology -     hydrocortisone (ANUSOL-HC) 25 MG suppository; Place 1 suppository (25 mg total) rectally 2 (two) times daily.    Follow up plan: Return if symptoms worsen or fail to improve.  [name], MD [facility] 08/17/2015, 5:13 PM

## 2015-08-17 NOTE — Patient Instructions (Signed)
Take blood pressure before taking medicines. If <120 on top or <80 on bottom dont take benazepril or the HCTZ.

## 2015-08-18 ENCOUNTER — Encounter: Payer: Self-pay | Admitting: Gastroenterology

## 2015-08-18 LAB — CBC
HEMATOCRIT: 33.3 % — AB (ref 34.0–46.6)
HEMOGLOBIN: 11.3 g/dL (ref 11.1–15.9)
MCH: 31 pg (ref 26.6–33.0)
MCHC: 33.9 g/dL (ref 31.5–35.7)
MCV: 91 fL (ref 79–97)
PLATELETS: 301 10*3/uL (ref 150–379)
RBC: 3.65 x10E6/uL — ABNORMAL LOW (ref 3.77–5.28)
RDW: 13 % (ref 12.3–15.4)
WBC: 8.2 10*3/uL (ref 3.4–10.8)

## 2015-08-22 ENCOUNTER — Emergency Department (HOSPITAL_COMMUNITY): Payer: BLUE CROSS/BLUE SHIELD

## 2015-08-22 ENCOUNTER — Encounter (HOSPITAL_COMMUNITY): Payer: Self-pay

## 2015-08-22 ENCOUNTER — Emergency Department (HOSPITAL_COMMUNITY)
Admission: EM | Admit: 2015-08-22 | Discharge: 2015-08-22 | Disposition: A | Discharge status: Home / Self Care | Payer: BLUE CROSS/BLUE SHIELD | Attending: Emergency Medicine | Admitting: Emergency Medicine

## 2015-08-22 DIAGNOSIS — F329 Major depressive disorder, single episode, unspecified: Secondary | ICD-10-CM | POA: Insufficient documentation

## 2015-08-22 DIAGNOSIS — Y929 Unspecified place or not applicable: Secondary | ICD-10-CM | POA: Insufficient documentation

## 2015-08-22 DIAGNOSIS — Z87891 Personal history of nicotine dependence: Secondary | ICD-10-CM | POA: Diagnosis not present

## 2015-08-22 DIAGNOSIS — W268XXA Contact with other sharp object(s), not elsewhere classified, initial encounter: Secondary | ICD-10-CM | POA: Insufficient documentation

## 2015-08-22 DIAGNOSIS — Y939 Activity, unspecified: Secondary | ICD-10-CM | POA: Insufficient documentation

## 2015-08-22 DIAGNOSIS — S90821A Blister (nonthermal), right foot, initial encounter: Secondary | ICD-10-CM | POA: Diagnosis present

## 2015-08-22 DIAGNOSIS — I10 Essential (primary) hypertension: Secondary | ICD-10-CM | POA: Insufficient documentation

## 2015-08-22 DIAGNOSIS — Z79899 Other long term (current) drug therapy: Secondary | ICD-10-CM | POA: Diagnosis not present

## 2015-08-22 DIAGNOSIS — L089 Local infection of the skin and subcutaneous tissue, unspecified: Secondary | ICD-10-CM | POA: Insufficient documentation

## 2015-08-22 DIAGNOSIS — Y999 Unspecified external cause status: Secondary | ICD-10-CM | POA: Insufficient documentation

## 2015-08-22 DIAGNOSIS — Z7982 Long term (current) use of aspirin: Secondary | ICD-10-CM | POA: Insufficient documentation

## 2015-08-22 LAB — CBC WITH DIFFERENTIAL/PLATELET
Basophils Absolute: 0 10*3/uL (ref 0.0–0.1)
Basophils Relative: 1 %
Eosinophils Absolute: 0.2 10*3/uL (ref 0.0–0.7)
Eosinophils Relative: 3 %
HEMATOCRIT: 30.3 % — AB (ref 36.0–46.0)
HEMOGLOBIN: 10.2 g/dL — AB (ref 12.0–15.0)
LYMPHS ABS: 1.7 10*3/uL (ref 0.7–4.0)
Lymphocytes Relative: 33 %
MCH: 31.5 pg (ref 26.0–34.0)
MCHC: 33.7 g/dL (ref 30.0–36.0)
MCV: 93.5 fL (ref 78.0–100.0)
MONOS PCT: 9 %
Monocytes Absolute: 0.5 10*3/uL (ref 0.1–1.0)
NEUTROS ABS: 2.6 10*3/uL (ref 1.7–7.7)
NEUTROS PCT: 54 %
Platelets: 227 10*3/uL (ref 150–400)
RBC: 3.24 MIL/uL — ABNORMAL LOW (ref 3.87–5.11)
RDW: 12.6 % (ref 11.5–15.5)
WBC: 4.9 10*3/uL (ref 4.0–10.5)

## 2015-08-22 MED ORDER — ACETAMINOPHEN-CODEINE #3 300-30 MG PO TABS
2.0000 | ORAL_TABLET | Freq: Once | ORAL | Status: AC
  Administered 2015-08-22: 2 via ORAL
  Filled 2015-08-22: qty 2

## 2015-08-22 MED ORDER — PROMETHAZINE HCL 12.5 MG PO TABS
12.5000 mg | ORAL_TABLET | Freq: Once | ORAL | Status: AC
  Administered 2015-08-22: 12.5 mg via ORAL
  Filled 2015-08-22: qty 1

## 2015-08-22 MED ORDER — DEXAMETHASONE 4 MG PO TABS: 4.0000 mg | ORAL_TABLET | Freq: Two times a day (BID) | ORAL | Status: DC

## 2015-08-22 MED ORDER — DOXYCYCLINE HYCLATE 100 MG PO CAPS: 100.0000 mg | ORAL_CAPSULE | Freq: Two times a day (BID) | ORAL | Status: DC

## 2015-08-22 MED ORDER — DOXYCYCLINE HYCLATE 100 MG PO TABS
100.0000 mg | ORAL_TABLET | Freq: Once | ORAL | Status: AC
  Administered 2015-08-22: 100 mg via ORAL
  Filled 2015-08-22: qty 1

## 2015-08-22 MED ORDER — ACETAMINOPHEN-CODEINE #3 300-30 MG PO TABS: 1.0000 | ORAL_TABLET | Freq: Four times a day (QID) | ORAL | Status: DC | PRN

## 2015-08-22 MED ORDER — PREDNISONE 50 MG PO TABS
60.0000 mg | ORAL_TABLET | Freq: Once | ORAL | Status: AC
  Administered 2015-08-22: 60 mg via ORAL
  Filled 2015-08-22: qty 1

## 2015-08-22 NOTE — ED Provider Notes (Signed)
History  By signing my name below, I, Jamie Mcgee, attest that this documentation has been prepared under the direction and in the presence of Jamie Kocher, PA-C. Electronically Signed: Marlowe Mcgee, ED Scribe. 08/22/2015. 7:55 PM.  Chief Complaint  Patient presents with  . Blister   The history is provided by the patient and medical records. No language interpreter was used.    HPI Comments:  Jamie Mcgee is a 55 y.o. female who presents to the Emergency Department complaining of a blister to the plantar aspect of the right foot that began three days ago. She states the area began itching and the blisters appeared the next day. Pt states she has been "picking" at the area last night hoping she could make it go away. She has not taken anything for pain. She reports associated swelling of the right foot. Bearing weight increases the pain. She denies alleviating factors. She denies fever, chills, nausea, vomiting.   Past Medical History  Diagnosis Date  . HTN (hypertension)   . Depression     hx of  . 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 Cataract extraction  08-2010    left   . Tubal ligation  1984    [facility]  . Renal artery angioplasty  2006    right  . Cataract extraction w/phaco  06/01/2011    Procedure: CATARACT EXTRACTION PHACO AND INTRAOCULAR LENS PLACEMENT (IOC);  Surgeon: Tonny Branch;  Location: [facility];  Service: Ophthalmology;  Laterality: Right;  CDE:9.62  . Appendectomy  1993   Family History  Problem Relation Age of Onset  . Coronary artery disease Father     States congenital abnormality  . Diabetes Father   . Hyperlipidemia Father   . Anesthesia problems Neg Hx   . Hypotension Neg Hx   . Malignant hyperthermia Neg Hx   . Pseudochol deficiency Neg Hx   . CAD Sister     Stents   . Cancer Mother    Social History  Substance Use Topics  . Smoking status: Former Smoker -- 1.00  packs/day for 30 years    Types: Cigarettes    Quit date: 03/22/2015  . Smokeless tobacco: Never Used  . Alcohol Use: No   OB History    No data available     Review of Systems  Skin: Positive for wound.  All other systems reviewed and are negative.   Allergies  Lipitor and Naproxen  Home Medications   Prior to Admission medications   Medication Sig Start Date End Date Taking? Authorizing Provider  aspirin EC 81 MG EC tablet Take 1 tablet (81 mg total) by mouth daily. 03/09/15   Orvan Falconer, MD  benazepril (LOTENSIN) 40 MG tablet Take 1 tablet (40 mg total) by mouth daily. 05/07/15   Sharion Balloon, FNP  carvedilol (COREG) 6.25 MG tablet Take 1 tablet (6.25 mg total) by mouth 2 (two) times daily with a meal. 05/07/15   Sharion Balloon, FNP  cholecalciferol (VITAMIN D) 1000 UNITS tablet Take 2 Units by mouth daily.     Historical Provider, MD  citalopram (CELEXA) 20 MG tablet Take 1 tablet (20 mg total) by mouth daily. 05/07/15   Sharion Balloon, FNP  DUEXIS 800-26.6 MG TABS Take 1 tablet by mouth 3 (three) times daily. 06/11/15   Historical Provider, MD  hydrochlorothiazide (HYDRODIURIL) 25 MG tablet Take 1 tablet (25 mg total) by mouth daily. 05/07/15  Sharion Balloon, FNP  hydrocortisone (ANUSOL-HC) 25 MG suppository Place 1 suppository (25 mg total) rectally 2 (two) times daily. 08/17/15   Eustaquio Maize, MD  Krill Oil 1000 MG CAPS Take 1,000 mg by mouth daily.    Historical Provider, MD  nitroGLYCERIN (NITROSTAT) 0.4 MG SL tablet Place 1 tablet (0.4 mg total) under the tongue every 5 (five) minutes as needed. Patient not taking: Reported on 08/17/2015 04/09/15   Herminio Commons, MD  pantoprazole (PROTONIX) 40 MG tablet Take 1 tablet (40 mg total) by mouth daily at 6 (six) AM. 05/07/15   Sharion Balloon, FNP  vitamin C (ASCORBIC ACID) 500 MG tablet Take 500 mg by mouth daily.    Historical Provider, MD   Triage Vitals: BP 150/65 mmHg  Pulse 83  Temp(Src) 98.8 F (37.1 C) (Oral)   Resp 18  Ht 5\' 5"  (1.651 m)  Wt 205 lb (92.987 kg)  BMI 34.11 kg/m2  SpO2 100%  LMP 03/25/2011 Physical Exam  Constitutional: She is oriented to person, place, and time. She appears well-developed and well-nourished.  HENT:  Head: Normocephalic and atraumatic.  Eyes: EOM are normal.  Neck: Normal range of motion.  Cardiovascular: Normal rate, regular rhythm and normal heart sounds.  Exam reveals no gallop and no friction rub.   No murmur heard. DP pulses 2+. PT pulses 2+.  Pulmonary/Chest: Effort normal.  Musculoskeletal: Normal range of motion.  2 linear presentations of blisters to right midfoot. Red streaking of the mid right foot going into medial right foot. Area warm but not hot. Tender to palpation. No lesions between toes. No edema of RLE.  Neurological: She is alert and oriented to person, place, and time.  Skin: Skin is warm and dry.  Psychiatric: She has a normal mood and affect. Her behavior is normal.  Nursing note and vitals reviewed.   ED Course  Procedures (including critical care time) DIAGNOSTIC STUDIES: Oxygen Saturation is 100% on RA, normal by my interpretation.   COORDINATION OF CARE: 7:53 PM- Will X-Ray right foot and order labs. Pt verbalizes understanding and agrees to plan.  Medications - No data to display  Labs Review Labs Reviewed - No data to display  Imaging Review No results found. I have personally reviewed and evaluated these images and lab results as part of my medical decision-making.   EKG Interpretation None      MDM  Pt has blisters of the plantar surface of the foot. She has been attempting to rupture the blisters with squeezing and with sharp objects. No high fever, but a red streak area is present. Question friction blisters with secondary infection, vs herpetic issue with secondary infection. Pt treated with doxycycline, decadron, and tylenol codeine. Pt to have the foot rechecked in 3 days.   Final diagnoses:  None     *I have reviewed nursing notes, vital signs, and all appropriate lab and imaging results for this patient.*  I personally performed the services described in this documentation, which was scribed in my presence. The recorded information has been reviewed and is accurate.    Jamie Kocher, PA-C 08/24/15 [place], MD 08/26/15 1600

## 2015-08-22 NOTE — ED Notes (Signed)
Patient states blister to posterior right foot that began with itching. Patient denies injury, drainage or fever.

## 2015-08-22 NOTE — Discharge Instructions (Signed)
The x-ray of your foot is negative for foreign body, or evidence of gas related infection. Please soak your foot in warm salt water daily. Please wrap your foot in the blister area, and use your postoperative shoe until the blisters resolved. Please use Decadron and doxycycline 2 times daily with food until all taken. Use Tylenol for mild pain, use Tylenol codeine for more severe pain. Take this medication with food. This medication may cause drowsiness, please use with caution. Please see Dr. Lenna Gilford for additional evaluation if not improving.

## 2015-10-08 ENCOUNTER — Ambulatory Visit: Payer: BLUE CROSS/BLUE SHIELD | Admitting: Gastroenterology

## 2015-10-08 ENCOUNTER — Telehealth: Payer: Self-pay | Admitting: Gastroenterology

## 2015-11-22 ENCOUNTER — Other Ambulatory Visit: Payer: Self-pay | Admitting: Family

## 2015-11-28 ENCOUNTER — Other Ambulatory Visit: Payer: Self-pay | Admitting: Family

## 2015-12-14 ENCOUNTER — Other Ambulatory Visit: Payer: Self-pay | Admitting: Family

## 2015-12-20 ENCOUNTER — Encounter: Payer: Self-pay | Admitting: Gastroenterology

## 2015-12-20 NOTE — Telephone Encounter (Signed)
error 

## 2015-12-21 ENCOUNTER — Ambulatory Visit: Payer: BLUE CROSS/BLUE SHIELD | Admitting: Gastroenterology

## 2015-12-24 ENCOUNTER — Encounter: Payer: Self-pay | Admitting: Pediatrics

## 2015-12-24 ENCOUNTER — Ambulatory Visit (INDEPENDENT_AMBULATORY_CARE_PROVIDER_SITE_OTHER): Payer: BLUE CROSS/BLUE SHIELD | Admitting: Pediatrics

## 2015-12-24 VITALS — BP 129/90 | HR 55 | Temp 98.2°F | Ht 65.0 in | Wt 193.2 lb

## 2015-12-24 DIAGNOSIS — K219 Gastro-esophageal reflux disease without esophagitis: Secondary | ICD-10-CM

## 2015-12-24 DIAGNOSIS — Z6832 Body mass index (BMI) 32.0-32.9, adult: Secondary | ICD-10-CM | POA: Diagnosis not present

## 2015-12-24 DIAGNOSIS — R739 Hyperglycemia, unspecified: Secondary | ICD-10-CM

## 2015-12-24 DIAGNOSIS — R7303 Prediabetes: Secondary | ICD-10-CM | POA: Diagnosis not present

## 2015-12-24 DIAGNOSIS — F32A Depression, unspecified: Secondary | ICD-10-CM

## 2015-12-24 DIAGNOSIS — F329 Major depressive disorder, single episode, unspecified: Secondary | ICD-10-CM

## 2015-12-24 DIAGNOSIS — I10 Essential (primary) hypertension: Secondary | ICD-10-CM | POA: Diagnosis not present

## 2015-12-24 LAB — BAYER DCA HB A1C WAIVED: HB A1C: 5.8 % (ref <7.0)

## 2015-12-24 NOTE — Progress Notes (Signed)
    Subjective:    Patient ID: Jamie Mcgee, female    DOB: November 10, 1960, 55 y.o.   MRN: [id number]  CC: Hypertension   HPI: Jamie Mcgee is a 55 y.o. female presenting for Hypertension  Had salty breakfast this morning No chest pain, no SOB Feeling well Episode of CP last fall Had low risk nuclear stress test at that time, normal LV function Not staying as active in the summer as she would like No further bleeding from hemorroids Continues to refrain from cigarettes Feels like mood has been ok  Depression screen [facility] 2/9 12/24/2015 07/29/2015 05/07/2015 01/29/2015 10/13/2014  Decreased Interest 0 0 0 0 0  Down, Depressed, Hopeless 0 0 0 0 0  PHQ - 2 Score 0 0 0 0 0     Relevant past medical, surgical, family and social history reviewed and updated as indicated.  Interim medical history since our last visit reviewed. Allergies and medications reviewed and updated.  ROS: Per HPI unless specifically indicated above  History  Smoking status  . Former Smoker -- 1.00 packs/day for 30 years  . Types: Cigarettes  . Quit date: 03/22/2015  Smokeless tobacco  . Never Used       Objective:    BP 129/90 mmHg  Pulse 55  Temp(Src) 98.2 F (36.8 C) (Oral)  Ht 5\' 5"  (1.651 m)  Wt 193 lb 3.2 oz (87.635 kg)  BMI 32.15 kg/m2  LMP 03/25/2011  Wt Readings from Last 3 Encounters:  12/24/15 193 lb 3.2 oz (87.635 kg)  08/22/15 205 lb (92.987 kg)  08/17/15 205 lb 12.8 oz (93.35 kg)     Gen: NAD, alert, cooperative with exam, NCAT EYES: EOMI, no scleral injection or icterus ENT:  TMs pearly gray b/l, OP without erythema LYMPH: no cervical LAD CV: NRRR, normal S1/S2, no murmur, distal pulses 2+ b/l Resp: CTABL, no wheezes, normal WOB Abd: +BS, soft, NTND. no guarding or organomegaly Ext: No edema, warm Neuro: Alert and oriented, strength equal b/l UE and LE, coordination grossly normal MSK: normal muscle bulk Psych: full affect     Assessment & Plan:    Clatie was seen  today for hypertension, med problem f/u.  Diagnoses and all orders for this visit:  Essential hypertension, benign Adequate control, cont current meds  Depressed Well controlled, will let me know if worsening  Gastroesophageal reflux disease, esophagitis presence not specified Asymptomatic as long as she takes PPI  BMI 32.0-32.9,adult Trying to lose weight Continue lifestyle changes  Hyperglycemia Pre-diabetes, HgA1c 5.8 Increase activity as above -     Bayer DCA Hb A1c Waived    Follow up plan: Return in about 6 months (around 06/25/2016).  [name], MD [facility] 12/24/2015, 11:38 AM

## 2015-12-25 DIAGNOSIS — R7303 Prediabetes: Secondary | ICD-10-CM | POA: Insufficient documentation

## 2016-01-07 ENCOUNTER — Other Ambulatory Visit: Payer: Self-pay | Admitting: Family

## 2016-01-07 DIAGNOSIS — F32A Depression, unspecified: Secondary | ICD-10-CM

## 2016-01-07 DIAGNOSIS — F329 Major depressive disorder, single episode, unspecified: Secondary | ICD-10-CM

## 2016-02-09 ENCOUNTER — Other Ambulatory Visit: Payer: Self-pay | Admitting: Family

## 2016-02-09 DIAGNOSIS — Z1231 Encounter for screening mammogram for malignant neoplasm of breast: Secondary | ICD-10-CM

## 2016-02-17 ENCOUNTER — Ambulatory Visit (HOSPITAL_COMMUNITY): Payer: BLUE CROSS/BLUE SHIELD

## 2016-02-25 ENCOUNTER — Ambulatory Visit (HOSPITAL_COMMUNITY)
Admission: RE | Admit: 2016-02-25 | Discharge: 2016-02-25 | Disposition: A | Discharge status: Home / Self Care | Payer: BLUE CROSS/BLUE SHIELD | Source: Ambulatory Visit | Attending: Family | Admitting: Family

## 2016-02-25 DIAGNOSIS — Z1231 Encounter for screening mammogram for malignant neoplasm of breast: Secondary | ICD-10-CM | POA: Diagnosis not present

## 2016-03-11 ENCOUNTER — Other Ambulatory Visit: Payer: Self-pay | Admitting: Pediatrics

## 2016-03-29 ENCOUNTER — Other Ambulatory Visit: Payer: Self-pay | Admitting: Pediatrics

## 2016-03-29 DIAGNOSIS — F329 Major depressive disorder, single episode, unspecified: Secondary | ICD-10-CM

## 2016-03-29 DIAGNOSIS — F32A Depression, unspecified: Secondary | ICD-10-CM

## 2016-06-04 ENCOUNTER — Other Ambulatory Visit: Payer: Self-pay | Admitting: Family

## 2016-06-15 ENCOUNTER — Encounter: Payer: Self-pay | Admitting: Family Medicine

## 2016-06-15 ENCOUNTER — Other Ambulatory Visit: Payer: Self-pay

## 2016-06-15 ENCOUNTER — Ambulatory Visit (HOSPITAL_COMMUNITY)
Admission: RE | Admit: 2016-06-15 | Discharge: 2016-06-15 | Disposition: A | Discharge status: Home / Self Care | Payer: BLUE CROSS/BLUE SHIELD | Source: Ambulatory Visit | Attending: Family Medicine | Admitting: Family Medicine

## 2016-06-15 ENCOUNTER — Ambulatory Visit (INDEPENDENT_AMBULATORY_CARE_PROVIDER_SITE_OTHER): Payer: BLUE CROSS/BLUE SHIELD | Admitting: Family Medicine

## 2016-06-15 VITALS — BP 109/73 | HR 74 | Temp 97.8°F | Ht 65.0 in | Wt 194.2 lb

## 2016-06-15 DIAGNOSIS — R932 Abnormal findings on diagnostic imaging of liver and biliary tract: Secondary | ICD-10-CM | POA: Insufficient documentation

## 2016-06-15 DIAGNOSIS — R1011 Right upper quadrant pain: Secondary | ICD-10-CM

## 2016-06-15 MED ORDER — BENAZEPRIL HCL 40 MG PO TABS: 40.0000 mg | ORAL_TABLET | Freq: Every day | ORAL | 1 refills | Status: DC

## 2016-06-15 MED ORDER — CITALOPRAM HYDROBROMIDE 20 MG PO TABS: 20.0000 mg | ORAL_TABLET | Freq: Every day | ORAL | 1 refills | Status: DC

## 2016-06-15 MED ORDER — CARVEDILOL 6.25 MG PO TABS: 6.2500 mg | ORAL_TABLET | Freq: Two times a day (BID) | ORAL | 1 refills | Status: DC

## 2016-06-15 MED ORDER — HYDROCHLOROTHIAZIDE 25 MG PO TABS: 25.0000 mg | ORAL_TABLET | Freq: Every day | ORAL | 1 refills | Status: DC

## 2016-06-15 MED ORDER — NITROGLYCERIN 0.4 MG SL SUBL: 0.4000 mg | SUBLINGUAL_TABLET | SUBLINGUAL | 3 refills | Status: DC | PRN

## 2016-06-15 NOTE — Progress Notes (Signed)
BP 109/73   Pulse 74   Temp 97.8 F (36.6 C) (Oral)   Ht 5\' 5"  (1.651 m)   Wt 194 lb 4 oz (88.1 kg)   LMP 05/25/2011   BMI 32.32 kg/m    Subjective:    Patient ID: Jamie Mcgee, female    DOB: 09-12-1960, 56 y.o.   MRN: [id number]  HPI: Jamie Mcgee is a 56 y.o. female presenting on 06/15/2016 for RUQ pain, diarrhea, nausea (x 2 months) and Medication Refill  HPI Right upper quadrant abdominal pain Patient is coming in with right upper quadrant abdominal pain is been going on for 2 months. She does note some intermittent nausea and diarrhea associated with it. She says that it most often occurs in the evening or at night. She has not noticed it sometimes awakens her at night. She does eat a lot of fatty and fried foods regularly but has not necessarily noticed it coming after any specific meal. She says usually the weights last 5 minutes to 30 minutes. They're mostly associated on the right upper side of her abdomen but sometimes in the center as well.  Relevant past medical, surgical, family and social history reviewed and updated as indicated. Interim medical history since our last visit reviewed. Allergies and medications reviewed and updated.  Review of Systems  Constitutional: Negative for chills and fever.  Respiratory: Negative for chest tightness and shortness of breath.   Cardiovascular: Negative for chest pain and leg swelling.  Gastrointestinal: Positive for abdominal pain, diarrhea and nausea. Negative for vomiting.  Genitourinary: Negative for difficulty urinating and dysuria.  Musculoskeletal: Negative for back pain and gait problem.  Skin: Negative for rash.  Neurological: Negative for light-headedness and headaches.  Psychiatric/Behavioral: Negative for agitation and behavioral problems.  All other systems reviewed and are negative.   Per HPI unless specifically indicated above   Allergies as of 06/15/2016      Reactions   Lipitor [atorvastatin] Other  (See Comments)   Muscle aches and nausea   Naproxen    Stomach pain      Medication List       Accurate as of 06/15/16 11:13 AM. Always use your most recent med list.          benazepril 40 MG tablet Commonly known as:  LOTENSIN Take 1 tablet (40 mg total) by mouth daily.   carvedilol 6.25 MG tablet Commonly known as:  COREG Take 1 tablet (6.25 mg total) by mouth 2 (two) times daily with a meal.   citalopram 20 MG tablet Commonly known as:  CELEXA Take 1 tablet (20 mg total) by mouth daily.   hydrochlorothiazide 25 MG tablet Commonly known as:  HYDRODIURIL Take 1 tablet (25 mg total) by mouth daily.   Krill Oil 1000 MG Caps Take 1,000 mg by mouth daily.   nitroGLYCERIN 0.4 MG SL tablet Commonly known as:  NITROSTAT Place 1 tablet (0.4 mg total) under the tongue every 5 (five) minutes as needed.   pantoprazole 40 MG tablet Commonly known as:  PROTONIX TAKE ONE TABLET BY MOUTH IN THE MORNING AT 6 AM          Objective:    BP 109/73   Pulse 74   Temp 97.8 F (36.6 C) (Oral)   Ht 5\' 5"  (1.651 m)   Wt 194 lb 4 oz (88.1 kg)   LMP 05/25/2011   BMI 32.32 kg/m   Wt Readings from Last 3 Encounters:  06/15/16 194 lb  4 oz (88.1 kg)  12/24/15 193 lb 3.2 oz (87.6 kg)  08/22/15 205 lb (93 kg)    Physical Exam  Constitutional: She is oriented to person, place, and time. She appears well-developed and well-nourished. No distress.  Eyes: Conjunctivae are normal.  Cardiovascular: Normal rate, regular rhythm, normal heart sounds and intact distal pulses.   No murmur heard. Pulmonary/Chest: Effort normal and breath sounds normal. No respiratory distress. She has no wheezes.  Abdominal: Soft. Bowel sounds are normal. She exhibits no distension. There is no hepatosplenomegaly. There is tenderness in the right upper quadrant and epigastric area. There is no rebound, no guarding and no CVA tenderness.  Musculoskeletal: Normal range of motion. She exhibits no edema or  tenderness.  Neurological: She is alert and oriented to person, place, and time. Coordination normal.  Skin: Skin is warm and dry. No rash noted. She is not diaphoretic.  Psychiatric: She has a normal mood and affect. Her behavior is normal.  Nursing note and vitals reviewed.   Results for orders placed or performed in visit on 12/24/15  Bayer DCA Hb A1c Waived  Result Value Ref Range   Bayer DCA Hb A1c Waived 5.8 <7.0 %      Assessment & Plan:   Problem List Items Addressed This Visit    None    Visit Diagnoses    RUQ abdominal pain    -  Primary   Relevant Orders   US Abdomen Limited RUQ       Follow up plan: Return if symptoms worsen or fail to improve.  Counseling provided for all of the vaccine components Orders Placed This Encounter  Procedures  . US Abdomen Limited RUQ    Joshua Dettinger, MD [facility] 06/15/2016, 11:13 AM

## 2016-06-15 NOTE — Progress Notes (Signed)
Date: 06/15/2016    DOB: [birth year], 56 years old  Sex: Female  CC: Right upper abdominal pain with diarrhea and nausea for 2 months  SUBJECTIVE  HPI: Patient presents with RUQ abdominal pain that she has struggled with for the last 2 months.  She describes the pain as being sharp and coming in waves that ramp up over about 5 minutes and then slowly dissipate.  She says it occurs more often at night, sometimes wakes her from sleep, and happens occasionally during the day.  In addition to the pain, she has been experiencing nausea and frequent episodes of diarrhea, which she describes as watery with mucus, and usually lasts 2-3 days.  She states that nothing worsens or improves her pain when it starts, but Immodium sometimes helps with the diarrhea after 4-5 doses.    PMH: Hypertension, Fibromuscular dysplasia of renal artery, GERD, Depression, Metabolic Syndrome, Pre-diabetes, Atypical chest pain,   Surgical Hx: Appendectomy (1993), Renal artery angioplasty (right-2006), Cataract Extraction (2012), tubal ligation [id number])  FH: Her Mother's medical history was significant for cancer and her Father's included diabetes, HLD and CAD (congenital abnormality).  Her sister has CAD as well.  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  She specifically states she often eats fried chicken from [employer]. She has a 30 pack year history of smoking and quit in October 2016.  She denies any alcohol use or recreational drug use.    Meds: Benazepril (lotensin) 40 mg, Carvedilol (coreg) 6.25 mg, Citalopram (celexa) 20 mg, HCTZ (hydrodiuril) 25 mg, Nitroglycerin (nitrostat) 0.4 mg Sl, Pantoprazole (protonix) 40 mg  Allergies: Atorvastatin (lipitor) - muscle aches and nausea; Naproxen - stomach pain   ROS:  Const:  Denies fever and chills   HEENT: Denies rhinorrhea, nasal congestion, sore throat, URI symptoms  GI:  Admits  abdominal pain, diarrhea, and nausea   Denies vomiting, abnormal rectal bleeding (some due to hemorrhoids), and Constipation  GU:  Denies hematuria or difficulty with urination  MSK:  Admits pain with trunk rotation to the right side   Denies trauma to the rib cage or abdomen  OBJECTIVE VS: BP 109/73 mm Hg, HR 74 bpm, T 97.4F (oral), Weight 194 lb, Height 5\' 5" , BMI 32.32 kg/m2  GENERAL ASSESSMENT: The patient seems uncomfortable, but otherwise in no acute distress.   PE:  CV:  No evidence of murmurs, rubs or gallops  Pulm: Clear to auscultation bilaterally  GI:  Abdomen is soft with bowel sounds present in all 4 quadrants.    There is TTP across the epigastric region & RUQ just inferior to the right costal margin to mid-axillary line   Murphy's Sign caused patient to stop inspiration and wince in pain   No evidence of hepatomegaly or splenomegaly with palpation.  GU:  There is no flank pain on percussion and the pelvis was not TTP  MSK: The ribs were non-tender   US Abdomen RUQ: No shadowing gallstones or thickening noted within gallbladder and there was a small layer of biliary sludge.  ASSESSMENT Diagnosis:  RUQ abdominal pain - R10.11  The patient's symptoms and physical exam findings are consistent with biliary colic. There is no evidence of gallstones or gallbladder thickening, although a layer of sludge was noted.   PLAN The patient has been contacted and notified of the RUQ ultrasound results.  She is in agreement with her PCP in moving forward with  a referral to general surgery.  She has been instructed to follow-up if her condition worsens or fails to improve.   Janace Hoard, PA-S

## 2016-06-19 ENCOUNTER — Telehealth: Payer: Self-pay | Admitting: Family Medicine

## 2016-06-19 NOTE — Telephone Encounter (Signed)
Informed pt referral was faxed to [facility] on Friday 06/16/16 and to expect a call from them for scheduling

## 2016-06-29 DIAGNOSIS — K8020 Calculus of gallbladder without cholecystitis without obstruction: Secondary | ICD-10-CM | POA: Diagnosis not present

## 2016-07-14 ENCOUNTER — Encounter: Payer: Self-pay | Admitting: Family Medicine

## 2016-07-14 ENCOUNTER — Ambulatory Visit (INDEPENDENT_AMBULATORY_CARE_PROVIDER_SITE_OTHER): Payer: BLUE CROSS/BLUE SHIELD | Admitting: Family Medicine

## 2016-07-14 VITALS — BP 104/67 | HR 80 | Temp 98.9°F | Ht 65.0 in | Wt 195.0 lb

## 2016-07-14 DIAGNOSIS — J329 Chronic sinusitis, unspecified: Secondary | ICD-10-CM | POA: Diagnosis not present

## 2016-07-14 DIAGNOSIS — J40 Bronchitis, not specified as acute or chronic: Secondary | ICD-10-CM | POA: Diagnosis not present

## 2016-07-14 MED ORDER — AMOXICILLIN-POT CLAVULANATE 875-125 MG PO TABS: 1.0000 | ORAL_TABLET | Freq: Two times a day (BID) | ORAL | 0 refills | Status: DC

## 2016-07-14 MED ORDER — GUAIFENESIN-CODEINE 100-10 MG/5ML PO SYRP: 5.0000 mL | ORAL_SOLUTION | ORAL | 0 refills | Status: DC | PRN

## 2016-07-14 NOTE — Progress Notes (Signed)
Subjective:  Patient ID: Jamie Mcgee, female    DOB: 09/20/1960  Age: 56 y.o. MRN: [id number]  CC: Cough (pt here today c/o cough, chest congestion and feeling worn out especially in the afternoon.)   HPI Jamie Mcgee presents for Onset last week of cough. She's had some tightness in her chest increasing this week. She does not have any of the other signs of bleeding such as fever, headache, myalgia. She denies sore throat. She does have some upper respiratory congestion and drainage.  History Jamie Mcgee has a past medical history of Depression; Fibromuscular dysplasia of renal artery ([facility]) (2006); and HTN (hypertension).   She has a past surgical history that includes Cataract extraction (08-2010); Tubal ligation (1984); Renal artery angioplasty (2006); Cataract extraction w/PHACO (06/01/2011); and Appendectomy (1993).   Her family history includes CAD in her sister; Cancer in her mother; Coronary artery disease in her father; Diabetes in her father; Hyperlipidemia in her father.She reports that she quit smoking about 15 months ago. Her smoking use included Cigarettes. She has a 30.00 pack-year smoking history. She has never used smokeless tobacco. She reports that she does not drink alcohol or use drugs.  Current Outpatient Prescriptions on File Prior to Visit  Medication Sig Dispense Refill  . benazepril (LOTENSIN) 40 MG tablet Take 1 tablet (40 mg total) by mouth daily. 90 tablet 1  . carvedilol (COREG) 6.25 MG tablet Take 1 tablet (6.25 mg total) by mouth 2 (two) times daily with a meal. 180 tablet 1  . citalopram (CELEXA) 20 MG tablet Take 1 tablet (20 mg total) by mouth daily. 90 tablet 1  . hydrochlorothiazide (HYDRODIURIL) 25 MG tablet Take 1 tablet (25 mg total) by mouth daily. 90 tablet 1  . Krill Oil 1000 MG CAPS Take 1,000 mg by mouth daily.    . nitroGLYCERIN (NITROSTAT) 0.4 MG SL tablet Place 1 tablet (0.4 mg total) under the tongue every 5 (five) minutes as needed. 25  tablet 3   No current facility-administered medications on file prior to visit.     ROS Review of Systems  Constitutional: Negative for activity change, appetite change, chills and fever.  HENT: Positive for congestion and sinus pressure. Negative for ear discharge, ear pain, hearing loss, nosebleeds, postnasal drip, rhinorrhea, sneezing and trouble swallowing.   Respiratory: Positive for cough. Negative for chest tightness and shortness of breath.   Cardiovascular: Negative for chest pain and palpitations.  Skin: Negative for rash.    Objective:  BP 104/67   Pulse 80   Temp 98.9 F (37.2 C) (Oral)   Ht 5\' 5"  (1.651 m)   Wt 195 lb (88.5 kg)   LMP 05/25/2011   BMI 32.45 kg/m   Physical Exam  Constitutional: She appears well-developed and well-nourished.  HENT:  Head: Normocephalic and atraumatic.  Right Ear: Tympanic membrane and external ear normal. No decreased hearing is noted.  Left Ear: Tympanic membrane and external ear normal. No decreased hearing is noted.  Nose: Mucosal edema present. Right sinus exhibits no frontal sinus tenderness. Left sinus exhibits no frontal sinus tenderness.  Mouth/Throat: No oropharyngeal exudate or posterior oropharyngeal erythema.  Neck: No Brudzinski's sign noted.  Pulmonary/Chest: Breath sounds normal. No respiratory distress.  Lymphadenopathy:       Head (right side): No preauricular adenopathy present.       Head (left side): No preauricular adenopathy present.       Right cervical: No superficial cervical adenopathy present.      Left  cervical: No superficial cervical adenopathy present.    Assessment & Plan:   Jamie Mcgee was seen today for cough.  Diagnoses and all orders for this visit:  Sinobronchitis  Other orders -     amoxicillin-clavulanate (AUGMENTIN) 875-125 MG tablet; Take 1 tablet by mouth 2 (two) times daily. Take all of this medication -     guaiFENesin-codeine (CHERATUSSIN AC) 100-10 MG/5ML syrup; Take 5 mLs by  mouth every 4 (four) hours as needed for cough.   I have discontinued Ms. Cundari's pantoprazole. I am also having her start on amoxicillin-clavulanate and guaiFENesin-codeine. Additionally, I am having her maintain her Krill Oil, nitroGLYCERIN, hydrochlorothiazide, citalopram, carvedilol, benazepril, and omeprazole.  Meds ordered this encounter  Medications  . omeprazole (PRILOSEC OTC) 20 MG tablet    Sig: Take 20 mg by mouth daily.  Marland Kitchen amoxicillin-clavulanate (AUGMENTIN) 875-125 MG tablet    Sig: Take 1 tablet by mouth 2 (two) times daily. Take all of this medication    Dispense:  20 tablet    Refill:  0  . guaiFENesin-codeine (CHERATUSSIN AC) 100-10 MG/5ML syrup    Sig: Take 5 mLs by mouth every 4 (four) hours as needed for cough.    Dispense:  180 mL    Refill:  0     Follow-up: Return if symptoms worsen or fail to improve.  Claretta Fraise, M.D.

## 2016-07-19 ENCOUNTER — Telehealth: Payer: Self-pay | Admitting: Family Medicine

## 2016-07-19 NOTE — Telephone Encounter (Signed)
Faxed OV Notes and Labs for [facility]

## 2016-07-22 DIAGNOSIS — K8000 Calculus of gallbladder with acute cholecystitis without obstruction: Secondary | ICD-10-CM | POA: Diagnosis not present

## 2016-07-28 ENCOUNTER — Other Ambulatory Visit: Payer: Self-pay | Admitting: General Surgery

## 2016-07-28 DIAGNOSIS — K8020 Calculus of gallbladder without cholecystitis without obstruction: Secondary | ICD-10-CM | POA: Diagnosis not present

## 2016-07-28 DIAGNOSIS — K811 Chronic cholecystitis: Secondary | ICD-10-CM | POA: Diagnosis not present

## 2016-09-08 ENCOUNTER — Ambulatory Visit (INDEPENDENT_AMBULATORY_CARE_PROVIDER_SITE_OTHER): Payer: BLUE CROSS/BLUE SHIELD | Admitting: Family

## 2016-09-08 ENCOUNTER — Encounter: Payer: Self-pay | Admitting: Family

## 2016-09-08 VITALS — BP 121/72 | HR 77 | Temp 97.8°F | Ht 65.0 in | Wt 194.2 lb

## 2016-09-08 DIAGNOSIS — R300 Dysuria: Secondary | ICD-10-CM

## 2016-09-08 DIAGNOSIS — R399 Unspecified symptoms and signs involving the genitourinary system: Secondary | ICD-10-CM | POA: Diagnosis not present

## 2016-09-08 LAB — URINALYSIS, COMPLETE
BILIRUBIN UA: NEGATIVE
Glucose, UA: NEGATIVE
KETONES UA: NEGATIVE
LEUKOCYTES UA: NEGATIVE
Nitrite, UA: NEGATIVE
PH UA: 5 (ref 5.0–7.5)
Protein, UA: NEGATIVE
RBC UA: NEGATIVE
SPEC GRAV UA: 1.015 (ref 1.005–1.030)
UUROB: 0.2 mg/dL (ref 0.2–1.0)

## 2016-09-08 LAB — MICROSCOPIC EXAMINATION: Renal Epithel, UA: NONE SEEN /hpf

## 2016-09-08 MED ORDER — FLUCONAZOLE 150 MG PO TABS: 150.0000 mg | ORAL_TABLET | Freq: Once | ORAL | 1 refills | Status: AC

## 2016-09-08 NOTE — Patient Instructions (Signed)

## 2016-09-08 NOTE — Addendum Note (Signed)
Addended by: Shelbie Ammons on: 09/08/2016 02:41 PM   Modules accepted: Orders

## 2016-09-08 NOTE — Progress Notes (Signed)
   Subjective:    Patient ID: Jamie Mcgee, female    DOB: July 18, 1960, 57 y.o.   MRN: [id number]  Dysuria   This is a new problem. The current episode started in the past 7 days. The problem occurs every urination. The problem has been waxing and waning. The quality of the pain is described as burning. The pain is at a severity of 8/10. The pain is mild. Associated symptoms include flank pain, frequency, hematuria, hesitancy and urgency. Pertinent negatives include no discharge, nausea or vomiting. She has tried increased fluids for the symptoms. The treatment provided mild relief.      Review of Systems  Gastrointestinal: Negative for nausea and vomiting.  Genitourinary: Positive for dysuria, flank pain, frequency, hematuria, hesitancy and urgency.  All other systems reviewed and are negative.      Objective:   Physical Exam  Constitutional: She is oriented to person, place, and time. She appears well-developed and well-nourished. No distress.  HENT:  Head: Normocephalic and atraumatic.  Right Ear: External ear normal.  Mouth/Throat: Oropharynx is clear and moist.  Eyes: Pupils are equal, round, and reactive to light.  Neck: Normal range of motion. Neck supple. No thyromegaly present.  Cardiovascular: Normal rate, regular rhythm, normal heart sounds and intact distal pulses.   No murmur heard. Pulmonary/Chest: Effort normal and breath sounds normal. No respiratory distress. She has no wheezes.  Abdominal: Soft. Bowel sounds are normal. She exhibits no distension. There is no tenderness.  Musculoskeletal: Normal range of motion. She exhibits no edema or tenderness.  Neurological: She is alert and oriented to person, place, and time. She has normal reflexes. No cranial nerve deficit.  Skin: Skin is warm and dry.  Psychiatric: She has a normal mood and affect. Her behavior is normal. Judgment and thought content normal.  Vitals reviewed.     BP 121/72   Pulse 77   Temp 97.8  F (36.6 C) (Oral)   Ht 5\' 5"  (1.651 m)   Wt 194 lb 3.2 oz (88.1 kg)   LMP 05/25/2011   BMI 32.32 kg/m      Assessment & Plan:  1. Dysuria - Urinalysis, Complete - Urine culture  2. UTI symptoms Force fluids AZO over the counter X2 days RTO prn Culture pending - Urine culture   Evelina Dun, FNP

## 2016-09-10 LAB — URINE CULTURE

## 2016-10-31 ENCOUNTER — Encounter: Payer: Self-pay | Admitting: Family

## 2016-10-31 ENCOUNTER — Ambulatory Visit (INDEPENDENT_AMBULATORY_CARE_PROVIDER_SITE_OTHER): Payer: BLUE CROSS/BLUE SHIELD | Admitting: Family

## 2016-10-31 VITALS — BP 136/87 | HR 89 | Temp 98.0°F | Ht 65.0 in | Wt 198.4 lb

## 2016-10-31 DIAGNOSIS — F331 Major depressive disorder, recurrent, moderate: Secondary | ICD-10-CM | POA: Diagnosis not present

## 2016-10-31 DIAGNOSIS — Z01419 Encounter for gynecological examination (general) (routine) without abnormal findings: Secondary | ICD-10-CM

## 2016-10-31 DIAGNOSIS — K219 Gastro-esophageal reflux disease without esophagitis: Secondary | ICD-10-CM

## 2016-10-31 DIAGNOSIS — I10 Essential (primary) hypertension: Secondary | ICD-10-CM

## 2016-10-31 DIAGNOSIS — Z0000 Encounter for general adult medical examination without abnormal findings: Secondary | ICD-10-CM | POA: Diagnosis not present

## 2016-10-31 DIAGNOSIS — E669 Obesity, unspecified: Secondary | ICD-10-CM

## 2016-10-31 DIAGNOSIS — Z780 Asymptomatic menopausal state: Secondary | ICD-10-CM

## 2016-10-31 DIAGNOSIS — Z720 Tobacco use: Secondary | ICD-10-CM

## 2016-10-31 DIAGNOSIS — E1142 Type 2 diabetes mellitus with diabetic polyneuropathy: Secondary | ICD-10-CM

## 2016-10-31 DIAGNOSIS — E559 Vitamin D deficiency, unspecified: Secondary | ICD-10-CM | POA: Diagnosis not present

## 2016-10-31 DIAGNOSIS — Z1211 Encounter for screening for malignant neoplasm of colon: Secondary | ICD-10-CM

## 2016-10-31 DIAGNOSIS — R7303 Prediabetes: Secondary | ICD-10-CM

## 2016-10-31 LAB — URINALYSIS, COMPLETE
Bilirubin, UA: NEGATIVE
GLUCOSE, UA: NEGATIVE
KETONES UA: NEGATIVE
Leukocytes, UA: NEGATIVE
NITRITE UA: NEGATIVE
PROTEIN UA: NEGATIVE
SPEC GRAV UA: 1.025 (ref 1.005–1.030)
Urobilinogen, Ur: 0.2 mg/dL (ref 0.2–1.0)
pH, UA: 5.5 (ref 5.0–7.5)

## 2016-10-31 LAB — MICROSCOPIC EXAMINATION
BACTERIA UA: NONE SEEN
RENAL EPITHEL UA: NONE SEEN /HPF

## 2016-10-31 LAB — BAYER DCA HB A1C WAIVED: HB A1C (BAYER DCA - WAIVED): 5.8 % (ref <7.0)

## 2016-10-31 MED ORDER — GABAPENTIN 300 MG PO CAPS: 300.0000 mg | ORAL_CAPSULE | Freq: Three times a day (TID) | ORAL | 3 refills | Status: DC

## 2016-10-31 NOTE — Patient Instructions (Signed)
Peripheral Neuropathy Peripheral neuropathy is a type of nerve damage. It affects nerves that carry signals between the spinal cord and other parts of the body. These are called peripheral nerves. With peripheral neuropathy, one nerve or a group of nerves may be damaged. What are the causes? Many things can damage peripheral nerves. For some people with peripheral neuropathy, the cause is unknown. Some causes include:  Diabetes. This is the most common cause of peripheral neuropathy.  Injury to a nerve.  Pressure or stress on a nerve that lasts a long time.  Too little vitamin B. Alcoholism can lead to this.  Infections.  Autoimmune diseases, such as multiple sclerosis and systemic lupus erythematosus.  Inherited nerve diseases.  Some medicines, such as cancer drugs.  Toxic substances, such as lead and mercury.  Too little blood flowing to the legs.  Kidney disease.  Thyroid disease.  What are the signs or symptoms? Different people have different symptoms. The symptoms you have will depend on which of your nerves is damaged. Common symptoms include:  Loss of feeling (numbness) in the feet and hands.  Tingling in the feet and hands.  Pain that burns.  Very sensitive skin.  Weakness.  Not being able to move a part of the body (paralysis).  Muscle twitching.  Clumsiness or poor coordination.  Loss of balance.  Not being able to control your bladder.  Feeling dizzy.  Sexual problems.  How is this diagnosed? Peripheral neuropathy is a symptom, not a disease. Finding the cause of peripheral neuropathy can be hard. To figure that out, your health care provider will take a medical history and do a physical exam. A neurological exam will also be done. This involves checking things affected by your brain, spinal cord, and nerves (nervous system). For example, your health care provider will check your reflexes, how you move, and what you can feel. Other types of tests  may also be ordered, such as:  Blood tests.  A test of the fluid in your spinal cord.  Imaging tests, such as CT scans or an MRI.  Electromyography (EMG). This test checks the nerves that control muscles.  Nerve conduction velocity tests. These tests check how fast messages pass through your nerves.  Nerve biopsy. A small piece of nerve is removed. It is then checked under a microscope.  How is this treated?  Medicine is often used to treat peripheral neuropathy. Medicines may include: ? Pain-relieving medicines. Prescription or over-the-counter medicine may be suggested. ? Antiseizure medicine. This may be used for pain. ? Antidepressants. These also may help ease pain from neuropathy. ? Lidocaine. This is a numbing medicine. You might wear a patch or be given a shot. ? Mexiletine. This medicine is typically used to help control irregular heart rhythms.  Surgery. Surgery may be needed to relieve pressure on a nerve or to destroy a nerve that is causing pain.  Physical therapy to help movement.  Assistive devices to help movement. Follow these instructions at home:  Only take over-the-counter or prescription medicines as directed by your health care provider. Follow the instructions carefully for any given medicines. Do not take any other medicines without first getting approval from your health care provider.  If you have diabetes, work closely with your health care provider to keep your blood sugar under control.  If you have numbness in your feet: ? Check every day for signs of injury or infection. Watch for redness, warmth, and swelling. ? Wear padded socks and comfortable   shoes. These help protect your feet.  Do not do things that put pressure on your damaged nerve.  Do not smoke. Smoking keeps blood from getting to damaged nerves.  Avoid or limit alcohol. Too much alcohol can cause a lack of B vitamins. These vitamins are needed for healthy nerves.  Develop a good  support system. Coping with peripheral neuropathy can be stressful. Talk to a mental health specialist or join a support group if you are struggling.  Follow up with your health care provider as directed. Contact a health care provider if:  You have new signs or symptoms of peripheral neuropathy.  You are struggling emotionally from dealing with peripheral neuropathy.  You have a fever. Get help right away if:  You have an injury or infection that is not healing.  You feel very dizzy or begin vomiting.  You have chest pain.  You have trouble breathing. This information is not intended to replace advice given to you by your health care provider. Make sure you discuss any questions you have with your health care provider. Document Released: 05/12/2002 Document Revised: 10/28/2015 Document Reviewed: 01/27/2013 Elsevier Interactive Patient Education  2017 [employer] Inc[employer]  

## 2016-10-31 NOTE — Progress Notes (Signed)
Subjective:    Patient ID: Jamie Mcgee, female    DOB: December 28, 1960, 56 y.o.   MRN: [id number]  Pt presents to the office today for CPE with pap.  Gynecologic Exam  The patient's pertinent negatives include no genital itching, genital lesions, genital odor or vaginal discharge.  Hypertension  This is a chronic problem. The current episode started more than 1 year ago. The problem has been resolved since onset. The problem is controlled. Associated symptoms include peripheral edema ("comes and goes"). Pertinent negatives include no blurred vision. Risk factors for coronary artery disease include diabetes mellitus, sedentary lifestyle and family history. The current treatment provides moderate improvement. There is no history of kidney disease, CAD/MI or heart failure.  Gastroesophageal Reflux  She complains of belching. She reports no heartburn or no water brash. This is a chronic problem. The problem has been waxing and waning. The symptoms are aggravated by certain foods. Associated symptoms include fatigue. Risk factors include obesity. She has tried a PPI for the symptoms. The treatment provided moderate relief.  Depression         This is a chronic problem.  The current episode started more than 1 year ago.   The problem occurs intermittently.  Associated symptoms include fatigue and sad.  Associated symptoms include no helplessness, no hopelessness, not irritable and no restlessness. Diabetes  She presents for her follow-up diabetic visit. She has type 2 diabetes mellitus. Associated symptoms include fatigue and foot paresthesias. Pertinent negatives for diabetes include no blurred vision. Diabetic complications include peripheral neuropathy. She is following a generally unhealthy diet.  Metabolic Syndrome Pt is not on low carb or low fat diet. Pt states she has an active job, but does not do any exercising at home.  Peripheral Neuropathy Pt complaining of new pain in bilateral feet that  started a few months ago. Pt states her pain is a 10 out 10 at bed time and is worse when she stands all day.    Review of Systems  Constitutional: Positive for fatigue.  Eyes: Negative for blurred vision.  Gastrointestinal: Negative for heartburn.  Genitourinary: Negative for vaginal discharge.  Psychiatric/Behavioral: Positive for depression.  All other systems reviewed and are negative.      Objective:   Physical Exam  Constitutional: She is oriented to person, place, and time. She appears well-developed and well-nourished. She is not irritable. No distress.  HENT:  Head: Normocephalic and atraumatic.  Right Ear: External ear normal.  Left Ear: External ear normal.  Nose: Nose normal.  Mouth/Throat: Oropharynx is clear and moist.  Eyes: Pupils are equal, round, and reactive to light.  Neck: Normal range of motion. Neck supple. No thyromegaly present.  Cardiovascular: Normal rate, regular rhythm, normal heart sounds and intact distal pulses.   No murmur heard. Pulmonary/Chest: Effort normal and breath sounds normal. No respiratory distress. She has no wheezes. Right breast exhibits no inverted nipple, no mass, no nipple discharge, no skin change and no tenderness. Left breast exhibits no inverted nipple, no mass, no nipple discharge, no skin change and no tenderness. Breasts are symmetrical.  Abdominal: Soft. Bowel sounds are normal. She exhibits no distension. There is no tenderness.  Genitourinary: Vagina normal.  Genitourinary Comments: Bimanual exam- no adnexal masses or tenderness, ovaries nonpalpable   Cervix parous and pink- No discharge   Musculoskeletal: Normal range of motion. She exhibits no edema or tenderness.  Neurological: She is alert and oriented to person, place, and time. She has normal reflexes.  No cranial nerve deficit.  Skin: Skin is warm and dry.  Psychiatric: She has a normal mood and affect. Her behavior is normal. Judgment and thought content normal.    Vitals reviewed.     BP 136/87   Pulse 89   Temp 98 F (36.7 C) (Oral)   Ht 5' 5"  (1.651 m)   Wt 198 lb 6.4 oz (90 kg)   LMP 05/25/2011   BMI 33.02 kg/m      Assessment & Plan:  1. Gynecologic exam normal - Urinalysis, Complete - CMP14+EGFR - CBC with Differential/Platelet  2. Essential hypertension, benign - CMP14+EGFR - CBC with Differential/Platelet  3. Gastroesophageal reflux disease, esophagitis presence not specified - CMP14+EGFR - CBC with Differential/Platelet  4. Vitamin D deficiency - CMP14+EGFR - CBC with Differential/Platelet - VITAMIN D 25 Hydroxy (Vit-D Deficiency, Fractures)  5. Tobacco abuse - CMP14+EGFR - CBC with Differential/Platelet  6. Pre-diabetes - Bayer DCA Hb A1c Waived - CMP14+EGFR - CBC with Differential/Platelet  7. Moderate episode of recurrent major depressive disorder (HCC) - CMP14+EGFR - CBC with Differential/Platelet  8. Obesity (BMI 30-39.9) - CMP14+EGFR - CBC with Differential/Platelet  9. Annual physical exam - Bayer DCA Hb A1c Waived - CMP14+EGFR - Lipid panel - CBC with Differential/Platelet - Thyroid Panel With TSH - VITAMIN D 25 Hydroxy (Vit-D Deficiency, Fractures) - Fecal occult blood, imunochemical; Future - gabapentin (NEURONTIN) 300 MG capsule; Take 1 capsule (300 mg total) by mouth 3 (three) times daily.  Dispense: 90 capsule; Refill: 3  10. Diabetic peripheral neuropathy (HCC) -Pt started on gabapentin today - CMP14+EGFR - CBC with Differential/Platelet - gabapentin (NEURONTIN) 300 MG capsule; Take 1 capsule (300 mg total) by mouth 3 (three) times daily.  Dispense: 90 capsule; Refill: 3  11. Colon cancer screening - CMP14+EGFR - CBC with Differential/Platelet - Fecal occult blood, imunochemical; Future   Continue all meds Labs pending Health Maintenance reviewed Diet and exercise encouraged RTO 6 months   Evelina Dun, FNP

## 2016-10-31 NOTE — Addendum Note (Signed)
Addended by: Shelbie Ammons on: 10/31/2016 03:41 PM   Modules accepted: Orders

## 2016-11-01 LAB — CBC WITH DIFFERENTIAL/PLATELET
BASOS ABS: 0 10*3/uL (ref 0.0–0.2)
Basos: 0 %
EOS (ABSOLUTE): 0.2 10*3/uL (ref 0.0–0.4)
Eos: 2 %
HEMOGLOBIN: 14.7 g/dL (ref 11.1–15.9)
Hematocrit: 43.5 % (ref 34.0–46.6)
Immature Grans (Abs): 0.1 10*3/uL (ref 0.0–0.1)
Immature Granulocytes: 1 %
LYMPHS ABS: 3.4 10*3/uL — AB (ref 0.7–3.1)
Lymphs: 34 %
MCH: 29.8 pg (ref 26.6–33.0)
MCHC: 33.8 g/dL (ref 31.5–35.7)
MCV: 88 fL (ref 79–97)
MONOCYTES: 8 %
MONOS ABS: 0.8 10*3/uL (ref 0.1–0.9)
NEUTROS ABS: 5.6 10*3/uL (ref 1.4–7.0)
Neutrophils: 55 %
Platelets: 283 10*3/uL (ref 150–379)
RBC: 4.93 x10E6/uL (ref 3.77–5.28)
RDW: 14.3 % (ref 12.3–15.4)
WBC: 10 10*3/uL (ref 3.4–10.8)

## 2016-11-01 LAB — LIPID PANEL
CHOL/HDL RATIO: 4.8 ratio — AB (ref 0.0–4.4)
Cholesterol, Total: 172 mg/dL (ref 100–199)
HDL: 36 mg/dL — AB (ref >39)
LDL CALC: 86 mg/dL (ref 0–99)
TRIGLYCERIDES: 252 mg/dL — AB (ref 0–149)
VLDL CHOLESTEROL CAL: 50 mg/dL — AB (ref 5–40)

## 2016-11-01 LAB — THYROID PANEL WITH TSH
Free Thyroxine Index: 1.7 (ref 1.2–4.9)
T3 Uptake Ratio: 22 % — ABNORMAL LOW (ref 24–39)
T4, Total: 7.9 ug/dL (ref 4.5–12.0)
TSH: 1.65 u[IU]/mL (ref 0.450–4.500)

## 2016-11-01 LAB — CMP14+EGFR
A/G RATIO: 1.5 (ref 1.2–2.2)
ALT: 25 IU/L (ref 0–32)
AST: 21 IU/L (ref 0–40)
Albumin: 4 g/dL (ref 3.5–5.5)
Alkaline Phosphatase: 101 IU/L (ref 39–117)
BUN/Creatinine Ratio: 9 (ref 9–23)
BUN: 9 mg/dL (ref 6–24)
Bilirubin Total: <0.2 mg/dL (ref 0.0–1.2)
CALCIUM: 9.2 mg/dL (ref 8.7–10.2)
CO2: 25 mmol/L (ref 18–29)
CREATININE: 1.04 mg/dL — AB (ref 0.57–1.00)
Chloride: 97 mmol/L (ref 96–106)
GFR, EST AFRICAN AMERICAN: 69 mL/min/{1.73_m2} (ref >59)
GFR, EST NON AFRICAN AMERICAN: 60 mL/min/{1.73_m2} (ref >59)
Globulin, Total: 2.6 g/dL (ref 1.5–4.5)
Glucose: 120 mg/dL — ABNORMAL HIGH (ref 65–99)
Potassium: 3.9 mmol/L (ref 3.5–5.2)
Sodium: 139 mmol/L (ref 134–144)
TOTAL PROTEIN: 6.6 g/dL (ref 6.0–8.5)

## 2016-11-01 LAB — VITAMIN D 25 HYDROXY (VIT D DEFICIENCY, FRACTURES): VIT D 25 HYDROXY: 36.9 ng/mL (ref 30.0–100.0)

## 2016-11-02 LAB — PAP IG W/ RFLX HPV ASCU: PAP SMEAR COMMENT: 0

## 2016-11-07 ENCOUNTER — Telehealth: Payer: Self-pay | Admitting: Family

## 2016-11-07 ENCOUNTER — Other Ambulatory Visit: Payer: Self-pay | Admitting: Family

## 2016-11-07 DIAGNOSIS — Z780 Asymptomatic menopausal state: Secondary | ICD-10-CM

## 2016-11-07 NOTE — Telephone Encounter (Signed)
Scheduled DEXA with pt

## 2016-11-09 ENCOUNTER — Ambulatory Visit (INDEPENDENT_AMBULATORY_CARE_PROVIDER_SITE_OTHER): Payer: BLUE CROSS/BLUE SHIELD

## 2016-11-09 DIAGNOSIS — Z780 Asymptomatic menopausal state: Secondary | ICD-10-CM

## 2016-11-09 DIAGNOSIS — Z0000 Encounter for general adult medical examination without abnormal findings: Secondary | ICD-10-CM | POA: Diagnosis not present

## 2016-11-09 DIAGNOSIS — Z1211 Encounter for screening for malignant neoplasm of colon: Secondary | ICD-10-CM | POA: Diagnosis not present

## 2016-11-09 NOTE — Addendum Note (Signed)
Addended by: Pollyann Kennedy F on: 11/09/2016 04:29 PM   Modules accepted: Orders

## 2016-11-12 LAB — FECAL OCCULT BLOOD, IMMUNOCHEMICAL: FECAL OCCULT BLD: NEGATIVE

## 2016-11-17 ENCOUNTER — Encounter: Payer: Self-pay | Admitting: Family

## 2016-11-17 ENCOUNTER — Ambulatory Visit (INDEPENDENT_AMBULATORY_CARE_PROVIDER_SITE_OTHER): Payer: BLUE CROSS/BLUE SHIELD | Admitting: Family

## 2016-11-17 VITALS — BP 117/78 | HR 71 | Temp 97.6°F | Ht 65.0 in | Wt 202.0 lb

## 2016-11-17 DIAGNOSIS — M7522 Bicipital tendinitis, left shoulder: Secondary | ICD-10-CM | POA: Diagnosis not present

## 2016-11-17 MED ORDER — PREDNISONE 10 MG (21) PO TBPK: ORAL_TABLET | ORAL | 0 refills | Status: DC

## 2016-11-17 MED ORDER — MELOXICAM 7.5 MG PO TABS: 7.5000 mg | ORAL_TABLET | Freq: Every day | ORAL | 0 refills | Status: DC

## 2016-11-17 NOTE — Progress Notes (Signed)
   Subjective:    Patient ID: Jamie Mcgee, female    DOB: 01-06-1961, 56 y.o.   MRN: [id number]  HPI    Review of Systems     Objective:   Physical Exam        Assessment & Plan:

## 2016-11-17 NOTE — Patient Instructions (Signed)
Biceps Tendon Tendinitis (Distal) Distal biceps tendon tendinitis is inflammation of the distal biceps tendon. The distal biceps tendon is a strong cord of tissue that connects the biceps muscle, on the front of the upper arm, to a bone (radius) in the elbow. Distal biceps tendon tendinitis can interfere with the ability to bend the elbow and turn the hand palm-up (supination). This condition is usually caused by overusing the elbow joint and the biceps muscle, and it usually heals within 6 weeks. Distal biceps tendon tendinitis may include a grade 1 or grade 2 strain of the tendon. A grade 1 strain is mild, and it involves a slight pull of the tendon without any stretching or noticeable tearing of the tendon. There is usually no loss of biceps muscle strength. A grade 2 strain is moderate, and it involves a small tear in the tendon. The tendon is stretched, and biceps muscle strength is usually decreased. What are the causes? This condition may be caused by:  A sudden increase in the frequency or intensity of activity that involves the elbow and the biceps muscle.  Overuse of the biceps muscle. This can happen when you do the same movements over and over, such as: ? Supination. ? Forceful straightening (hyperextension) of the elbow. ? Bending of the elbow.  A direct, forceful hit or injury (trauma) to the elbow. This is rare.  What increases the risk? The following factors may make you more likely to develop this condition:  Playing contact sports.  Playing sports that involve throwing and overhead movements, including racket sports, gymnastics, weight lifting, or bodybuilding.  Doing physical labor.  Having poor strength and flexibility of the arm and shoulder.  Having injured other parts of the elbow.  What are the signs or symptoms? Symptoms of this condition may include:  Pain and inflammation in the front of the elbow. Pain may get worse during certain movements, such  as: ? Supination. ? Bending the elbow. ? Lifting or carrying objects. ? Throwing.  A feeling of warmth in the front of the elbow.  A crackling sound (crepitation) when you move or touch the elbow or the upper arm.  In some cases, symptoms may return (recur) after treatment, and they may be long-lasting (chronic). How is this diagnosed? This condition is diagnosed based on your symptoms, your medical history, and a physical exam. You may have tests, including X-rays or MRIs. Your health care provider may test your range of motion by having you do arm movements. How is this treated? This condition is treated by resting and icing the injured area, and by doing physical therapy exercises. Depending on the severity of your condition, treatment may also include:  Medicines to help relieve pain and inflammation.  Ultrasound therapy. This is the application of sound waves to the injured area.  Follow these instructions at home: Managing pain, stiffness, and swelling  If directed, put ice on the injured area: ? Put ice in a plastic bag. ? Place a towel between your skin and the bag. ? Leave the ice on for 20 minutes, 2-3 times a day.  Move your fingers often to avoid stiffness and to lessen swelling.  Raise (elevate) the injured area above the level of your heart while you are sitting or lying down.  If directed, apply heat to the affected area before you exercise. Use the heat source that your health care provider recommends, such as a moist heat pack or a heating pad. ? Place a towel between   your skin and the heat source. ? Leave the heat on for 20-30 minutes. ? Remove the heat if your skin turns bright red. This is especially important if you are unable to feel pain, heat, or cold. You may have a greater risk of getting burned. Activity  Return to your normal activities as told by your health care provider. Ask your health care provider what activities are safe for you.  Do not lift  anything that is heavier than 10 lb (4.5 kg) until your health care provider tells you that it is safe.  Avoid activities that cause pain or make your condition worse.  Do exercises as told by your health care provider. General instructions  Take over-the-counter and prescription medicines only as told by your health care provider.  Do not drive or operate heavy machinery while taking prescription pain medicines.  Keep all follow-up visits as told by your health care provider. This is important. How is this prevented?  Warm up and stretch before being active.  Cool down and stretch after being active.  Give your body time to rest between periods of activity.  Make sure to use equipment that fits you.  Be safe and responsible while being active to avoid falls.  Do at least 150 minutes of moderate-intensity exercise each week, such as brisk walking or water aerobics.  Maintain physical fitness, including: ? Strength. ? Flexibility. ? Cardiovascular fitness. ? Endurance. Contact a health care provider if:  You have symptoms that get worse or do not get better after 2 weeks of treatment.  You develop new symptoms. Get help right away if:  You develop severe pain. This information is not intended to replace advice given to you by your health care provider. Make sure you discuss any questions you have with your health care provider. Document Released: 05/22/2005 Document Revised: 01/27/2016 Document Reviewed: 04/30/2015 Elsevier Interactive Patient Education  2018 [employer]  

## 2016-11-17 NOTE — Progress Notes (Signed)
   Subjective:    Patient ID: Jamie Mcgee, female    DOB: Jan 29, 1961, 56 y.o.   MRN: [id number]  Shoulder Pain   The pain is present in the left shoulder. This is a new problem. The current episode started in the past 7 days. There has been no history of extremity trauma. The problem occurs intermittently. The problem has been gradually worsening. The quality of the pain is described as dull. The pain is at a severity of 7/10. The pain is mild. Associated symptoms include numbness and tingling. Pertinent negatives include no joint swelling or limited range of motion. She has tried rest and NSAIDS for the symptoms. The treatment provided mild relief.      Review of Systems  Neurological: Positive for tingling and numbness.  All other systems reviewed and are negative.      Objective:   Physical Exam  Constitutional: She is oriented to person, place, and time. She appears well-developed and well-nourished. No distress.  HENT:  Head: Normocephalic.  Eyes: Pupils are equal, round, and reactive to light.  Neck: Normal range of motion. Neck supple. No thyromegaly present.  Cardiovascular: Normal rate, regular rhythm, normal heart sounds and intact distal pulses.   No murmur heard. Pulmonary/Chest: Effort normal and breath sounds normal. No respiratory distress. She has no wheezes.  Abdominal: Soft. Bowel sounds are normal. She exhibits no distension. There is no tenderness.  Musculoskeletal: Normal range of motion. She exhibits tenderness. She exhibits no edema.  Mild pain in left lateral bicep with supination of left hand   Neurological: She is alert and oriented to person, place, and time.  Skin: Skin is warm and dry.  Psychiatric: She has a normal mood and affect. Her behavior is normal. Judgment and thought content normal.  Vitals reviewed.    BP 117/78   Pulse 71   Temp 97.6 F (36.4 C) (Oral)   Ht 5\' 5"  (1.651 m)   Wt 202 lb (91.6 kg)   LMP 05/25/2011   BMI 33.61 kg/m        Assessment & Plan:  1. Biceps tendinitis of left upper extremity Rest Ice ROM exercises encouraged Continue Prilosec   RTO Prn  - meloxicam (MOBIC) 7.5 MG tablet; Take 1 tablet (7.5 mg total) by mouth daily.  Dispense: 30 tablet; Refill: 0 - predniSONE (STERAPRED UNI-PAK 21 TAB) 10 MG (21) TBPK tablet; Use as directed  Dispense: 21 tablet; Refill: 0   Evelina Dun, FNP

## 2016-11-22 ENCOUNTER — Encounter: Payer: Self-pay | Admitting: Pharmacist

## 2016-11-22 ENCOUNTER — Ambulatory Visit (INDEPENDENT_AMBULATORY_CARE_PROVIDER_SITE_OTHER): Payer: BLUE CROSS/BLUE SHIELD | Admitting: Pharmacist

## 2016-11-22 VITALS — Ht 65.0 in | Wt 198.0 lb

## 2016-11-22 DIAGNOSIS — M8589 Other specified disorders of bone density and structure, multiple sites: Secondary | ICD-10-CM

## 2016-11-22 DIAGNOSIS — M8580 Other specified disorders of bone density and structure, unspecified site: Secondary | ICD-10-CM | POA: Insufficient documentation

## 2016-11-22 DIAGNOSIS — Z720 Tobacco use: Secondary | ICD-10-CM | POA: Diagnosis not present

## 2016-11-22 NOTE — Patient Instructions (Signed)
Exercise for Strong Bones  Exercise is important to build and maintain strong bones / bone density.  There are 2 types of exercises that are important to building and maintaining strong bones:  Weight- bearing and muscle-stregthening.  Weight-bearing Exercises  These exercises include activities that make you move against gravity while staying upright. Weight-bearing exercises can be high-impact or low-impact.  High-impact weight-bearing exercises help build bones and keep them strong. If you have broken a bone due to osteoporosis or are at risk of breaking a bone, you may need to avoid high-impact exercises. If you're not sure, you should check with your healthcare provider.  Examples of high-impact weight-bearing exercises are: Dancing  Doing high-impact aerobics  Hiking  Jogging/running  Jumping Rope  Stair climbing  Tennis  Low-impact weight-bearing exercises can also help keep bones strong and are a safe alternative if you cannot do high-impact exercises.   Examples of low-impact weight-bearing exercises are: Using elliptical training machines  Doing low-impact aerobics  Using stair-step machines  Fast walking on a treadmill or outside   Muscle-Strengthening Exercises These exercises include activities where you move your body, a weight or some other resistance against gravity. They are also known as resistance exercises and include: Lifting weights  Using elastic exercise bands  Using weight machines  Lifting your own body weight  Functional movements, such as standing and rising up on your toes  Yoga and Pilates can also improve strength, balance and flexibility. However, certain positions may not be safe for people with osteoporosis or those at increased risk of broken bones. For example, exercises that have you bend forward may increase the chance of breaking a bone in the spine.   Non-Impact Exercises There are other types of exercises that can help  prevent falls.  Non-impact exercises can help you to improve balance, posture and how well you move in everyday activities. Some of these exercises include: Balance exercises that strengthen your legs and test your balance, such as Tai Chi, can decrease your risk of falls.  Posture exercises that improve your posture and reduce rounded or "sloping" shoulders can help you decrease the chance of breaking a bone, especially in the spine.  Functional exercises that improve how well you move can help you with everyday activities and decrease your chance of falling and breaking a bone. For example, if you have trouble getting up from a chair or climbing stairs, you should do these activities as exercises.   **A physical therapist can teach you balance, posture and functional exercises. He/she can also help you learn which exercises are safe and appropriate for you.  [facility] has a physical therapy office in [place] in front of our office and referrals can be made for assessments and treatment as needed and strength and balance training.  If you would like to have an assessment with [facility] and our physical therapy team please let a nurse or provider know.   Fall Prevention in the Home Falls can cause injuries and can affect people from all age groups. There are many simple things that you can do to make your home safe and to help prevent falls. What can I do on the outside of my home?  Regularly repair the edges of walkways and driveways and fix any cracks.  Remove high doorway thresholds.  Trim any shrubbery on the main path into your home.  Use bright outdoor lighting.  Clear walkways of debris and clutter, including tools and rocks.  Regularly check that handrails   are securely fastened and in good repair. Both sides of any steps should have handrails.  Install guardrails along the edges of any raised decks or porches.  Have leaves, snow, and ice cleared regularly.  Use sand or salt on  walkways during winter months.  In the garage, clean up any spills right away, including grease or oil spills. What can I do in the bathroom?  Use night lights.  Install grab bars by the toilet and in the tub and shower. Do not use towel bars as grab bars.  Use non-skid mats or decals on the floor of the tub or shower.  If you need to sit down while you are in the shower, use a plastic, non-slip stool.  Keep the floor dry. Immediately clean up any water that spills on the floor.  Remove soap buildup in the tub or shower on a regular basis.  Attach bath mats securely with double-sided non-slip rug tape.  Remove throw rugs and other tripping hazards from the floor. What can I do in the bedroom?  Use night lights.  Make sure that a bedside light is easy to reach.  Do not use oversized bedding that drapes onto the floor.  Have a firm chair that has side arms to use for getting dressed.  Remove throw rugs and other tripping hazards from the floor. What can I do in the kitchen?  Clean up any spills right away.  Avoid walking on wet floors.  Place frequently used items in easy-to-reach places.  If you need to reach for something above you, use a sturdy step stool that has a grab bar.  Keep electrical cables out of the way.  Do not use floor polish or wax that makes floors slippery. If you have to use wax, make sure that it is non-skid floor wax.  Remove throw rugs and other tripping hazards from the floor. What can I do in the stairways?  Do not leave any items on the stairs.  Make sure that there are handrails on both sides of the stairs. Fix handrails that are broken or loose. Make sure that handrails are as long as the stairways.  Check any carpeting to make sure that it is firmly attached to the stairs. Fix any carpet that is loose or worn.  Avoid having throw rugs at the top or bottom of stairways, or secure the rugs with carpet tape to prevent them from  moving.  Make sure that you have a light switch at the top of the stairs and the bottom of the stairs. If you do not have them, have them installed. What are some other fall prevention tips?  Wear closed-toe shoes that fit well and support your feet. Wear shoes that have rubber soles or low heels.  When you use a stepladder, make sure that it is completely opened and that the sides are firmly locked. Have someone hold the ladder while you are using it. Do not climb a closed stepladder.  Add color or contrast paint or tape to grab bars and handrails in your home. Place contrasting color strips on the first and last steps.  Use mobility aids as needed, such as canes, walkers, scooters, and crutches.  Turn on lights if it is dark. Replace any light bulbs that burn out.  Set up furniture so that there are clear paths. Keep the furniture in the same spot.  Fix any uneven floor surfaces.  Choose a carpet design that does not hide the edge   of steps of a stairway.  Be aware of any and all pets.  Review your medicines with your healthcare provider. Some medicines can cause dizziness or changes in blood pressure, which increase your risk of falling. Talk with your health care provider about other ways that you can decrease your risk of falls. This may include working with a physical therapist or trainer to improve your strength, balance, and endurance. This information is not intended to replace advice given to you by your health care provider. Make sure you discuss any questions you have with your health care provider. Document Released: 05/12/2002 Document Revised: 10/19/2015 Document Reviewed: 06/26/2014 Elsevier Interactive Patient Education  2017 [employer]  

## 2016-11-22 NOTE — Progress Notes (Signed)
   HPI: Patient had first DEXA 11/09/2016 which showed osteopenia. Here today to discuss results, treatment and fracture risk.  Back Pain?  Yes - lower back pain - no history for back surgery       Kyphosis?  No Prior fracture?  Yes - broke fifth metatarsal on right foot at age 56yo Med(s) for Osteoporosis/Osteopenia:  none Med(s) previously tried for Osteoporosis/Osteopenia:  none                                                             PMH: Age at menopause:  Currently perimenopausal Hysterectomy?  No Oophorectomy?  No HRT? No Steroid Use?  Yes - Current.  Type/duration: currently on prednisone taper - will take last dose tomorrow Thyroid med?  No History of cancer?  No History of digestive disorders (ie Crohn's)?  Yes - GERD on PPI fr GERD and NSAID prophylaxsis Current or previous eating disorders?  No Last Vitamin D Result:  10/31/2016 was 36.9 Last GFR Result:  60 (10/31/2016)   FH/SH: Family history of osteoporosis?  Yes - maternal grandmother Parent with history of hip fracture?  No Family history of breast cancer?  Yes - sister, mother, maternal grandmother, mat aunt Exercise?  No Smoking?  Yes - 1ppd.  .  Quit in 2016 for 7 months but restarted again last year Alcohol?  No    Calcium Assessment Calcium Intake  # of servings/day  Calcium mg  Milk (8 oz) 0  x  300  = 0  Yogurt (4 oz) 0 x  200 = 0  Cheese (1 oz) 1 x  200 = 200mg   Other Calcium sources   250mg   Ca supplement 600mg  bid = 1200mg    Estimated calcium intake per day 1650mg     DEXA Results Date of Test T-Score for AP Spine L1-L4 T-Score for neck Right Hip  11/09/2016 -1.9 -1.3               FRAX 10 year estimate: Total FX risk:  11.2%  (consider medication if >/= 20%) Hip FX risk:  1.4%  (consider medication if >/= 3%)  Assessment: Osteopenia   Recommendations: 1.   Discussed BMD  / DEXA results and discussed fracture risk. 2.  recommend calcium 1200mg  daily through supplementation or diet.   Discussed started calcium citrate since absorption would not be affected by PPI / omeprazole 3.  recommend weight bearing exercise - 30 minutes at least 4 days per week.   4.  Counseled and educated about fall risk and prevention. 5.  Discussed smoking cessation - patient states she is not ready to quit currently.  Told her to notify office when she is and we can discuss pharmacotherapy if needed.   Recheck DEXA:  2 years  Time spent counseling patient:  35 minutes

## 2016-12-17 ENCOUNTER — Other Ambulatory Visit: Payer: Self-pay | Admitting: Family Medicine

## 2016-12-25 ENCOUNTER — Emergency Department (HOSPITAL_COMMUNITY)
Admission: EM | Admit: 2016-12-25 | Discharge: 2016-12-25 | Disposition: A | Discharge status: Home / Self Care | Payer: BLUE CROSS/BLUE SHIELD | Attending: Emergency Medicine | Admitting: Emergency Medicine

## 2016-12-25 ENCOUNTER — Encounter (HOSPITAL_COMMUNITY): Payer: Self-pay

## 2016-12-25 DIAGNOSIS — B309 Viral conjunctivitis, unspecified: Secondary | ICD-10-CM | POA: Insufficient documentation

## 2016-12-25 DIAGNOSIS — B308 Other viral conjunctivitis: Secondary | ICD-10-CM

## 2016-12-25 DIAGNOSIS — I10 Essential (primary) hypertension: Secondary | ICD-10-CM | POA: Insufficient documentation

## 2016-12-25 DIAGNOSIS — Z79899 Other long term (current) drug therapy: Secondary | ICD-10-CM | POA: Insufficient documentation

## 2016-12-25 DIAGNOSIS — F17210 Nicotine dependence, cigarettes, uncomplicated: Secondary | ICD-10-CM | POA: Insufficient documentation

## 2016-12-25 MED ORDER — TOBRAMYCIN 0.3 % OP SOLN
2.0000 [drp] | Freq: Once | OPHTHALMIC | Status: AC
  Administered 2016-12-25: 2 [drp] via OPHTHALMIC
  Filled 2016-12-25: qty 5

## 2016-12-25 NOTE — ED Notes (Signed)
Patient given discharge instruction, verbalized understand. Patient ambulatory out of the department.  

## 2016-12-25 NOTE — ED Triage Notes (Addendum)
Pt reports left eye drainage (yellow), "glues together when sleeping", blurry vision, with associated subconjunctival hemorrhage x1 week. Denies injury. Denies pain. States she used OTC pink eye relief last week and it was ineffective.

## 2016-12-25 NOTE — Discharge Instructions (Signed)
Please wash hands frequently. Please white down surfaces frequently. Please change your pillowcases daily. Use cool compresses to your left eye 3 or 4 times daily. Use 2 drops of tobramycin to the left eye every 4 hours while awake over the next 5 days. Please see Dr. Lenna Gilford for additional evaluation and management if not improving. Using dark glasses and bright light may be helpful. Use Tylenol every 4 hours or ibuprofen every 6 hours if needed for headache or discomfort.

## 2016-12-25 NOTE — ED Provider Notes (Signed)
[facility] Provider Note   CSN: [id number] Arrival date & time: 12/25/16  1054     History   Chief Complaint Chief Complaint  Patient presents with  . Eye Problem    HPI Jamie Mcgee is a 56 y.o. female.  Patient is a 56 year old female who presents to the emergency department with complaint of drainage from the left eye.  The patient states that approximately 2 weeks ago she had a stye on the eye. The stye gradually went away, and all 5 or 6 days ago she began to have increasing redness in the left eye. She now has redness as well as drainage. She says thatshe feels as if the left eye is matted shut in the mornings. She has pain with changing brightness of lighted areas.   The history is provided by the patient.  Eye Problem   Associated symptoms include discharge, photophobia and eye redness.    Past Medical History:  Diagnosis Date  . Depression    hx of  . 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 GERD (gastroesophageal reflux disease)   . HTN (hypertension)     Patient Active Problem List   Diagnosis Date Noted  . Osteopenia 11/22/2016  . Pre-diabetes 12/25/2015  . Obesity (BMI 30-39.9) 12/25/2015  . Cramp of both lower extremities 07/02/2015  . GERD (gastroesophageal reflux disease) 05/07/2015  . Vitamin D deficiency 05/07/2015  . Metabolic syndrome 67/89/3810  . Post-menopausal bleeding 05/07/2015  . Tobacco abuse 03/08/2015  . Atypical chest pain 03/08/2015  . Depressed 12/19/2013  . Essential hypertension, benign 12/19/2013    Past Surgical History:  Procedure Laterality Date  . APPENDECTOMY  1993  . CATARACT EXTRACTION  08-2010   left   . CATARACT EXTRACTION W/PHACO  06/01/2011   Procedure: CATARACT EXTRACTION PHACO AND INTRAOCULAR LENS PLACEMENT (IOC);  Surgeon: Tonny Branch;  Location: [facility];  Service: Ophthalmology;  Laterality: Right;  CDE:9.62  . CHOLECYSTECTOMY    . RENAL ARTERY ANGIOPLASTY  2006   right  . TUBAL  LIGATION  1984   [facility]    OB History    No data available       Home Medications    Prior to Admission medications   Medication Sig Start Date End Date Taking? Authorizing Provider  benazepril (LOTENSIN) 40 MG tablet TAKE ONE TABLET BY MOUTH ONCE DAILY 12/18/16   Evelina Dun A, FNP  carvedilol (COREG) 6.25 MG tablet Take 1 tablet (6.25 mg total) by mouth 2 (two) times daily with a meal. 06/15/16   Dettinger, Fransisca Kaufmann, MD  citalopram (CELEXA) 20 MG tablet Take 1 tablet (20 mg total) by mouth daily. 06/15/16   Dettinger, Fransisca Kaufmann, MD  gabapentin (NEURONTIN) 300 MG capsule Take 1 capsule (300 mg total) by mouth 3 (three) times daily. 10/31/16   Evelina Dun A, FNP  hydrochlorothiazide (HYDRODIURIL) 25 MG tablet Take 1 tablet (25 mg total) by mouth daily. 06/15/16   Dettinger, Fransisca Kaufmann, MD  Krill Oil 1000 MG CAPS Take 1,000 mg by mouth daily.    [provider]  meloxicam (MOBIC) 7.5 MG tablet Take 1 tablet (7.5 mg total) by mouth daily. 11/17/16   Sharion Balloon, FNP  nitroGLYCERIN (NITROSTAT) 0.4 MG SL tablet Place 1 tablet (0.4 mg total) under the tongue every 5 (five) minutes as needed. 06/15/16   Dettinger, Fransisca Kaufmann, MD  omeprazole (PRILOSEC OTC) 20 MG tablet Take 20 mg by mouth daily.    [provider]  predniSONE (STERAPRED UNI-PAK 21 TAB) 10 MG (21) TBPK tablet Use as directed 11/17/16   Sharion Balloon, FNP    Family History Family History  Problem Relation Age of Onset  . Coronary artery disease Father        States congenital abnormality  . Diabetes Father   . Hyperlipidemia Father   . CAD Sister        Stents   . Cancer Sister        breast  . Cancer Mother        breast  . Cancer Maternal Aunt        breast  . Cancer Maternal Grandmother        breast  . Anesthesia problems Neg Hx   . Hypotension Neg Hx   . Malignant hyperthermia Neg Hx   . Pseudochol deficiency Neg Hx     Social History Social History  Substance Use Topics  . Smoking  status: Current Every Day Smoker    Packs/day: 1.00    Years: 30.00    Types: Cigarettes  . Smokeless tobacco: Never Used     Comment: pt quit smoking 2016 for 7 months but restarted last year  . Alcohol use No     Allergies   Lipitor [atorvastatin] and Naproxen   Review of Systems Review of Systems  Constitutional: Negative for activity change.       All ROS Neg except as noted in HPI  HENT: Negative for nosebleeds.   Eyes: Positive for photophobia, pain, discharge and redness.  Respiratory: Negative for cough, shortness of breath and wheezing.   Cardiovascular: Negative for chest pain and palpitations.  Gastrointestinal: Negative for abdominal pain and blood in stool.  Genitourinary: Negative for dysuria, frequency and hematuria.  Musculoskeletal: Negative for arthralgias, back pain and neck pain.  Skin: Negative.   Neurological: Negative for dizziness, seizures and speech difficulty.  Psychiatric/Behavioral: Negative for confusion and hallucinations.     Physical Exam Updated Vital Signs BP 135/81 (BP Location: Right Arm)   Pulse 79   Temp 98.4 F (36.9 C) (Oral)   Resp 16   Ht 5\' 4"  (1.626 m)   Wt 90.7 kg (200 lb)   LMP 05/25/2011   SpO2 97%   BMI 34.33 kg/m   Physical Exam  Constitutional: She is oriented to person, place, and time. She appears well-developed and well-nourished.  Non-toxic appearance.  HENT:  Head: Normocephalic.  Right Ear: Tympanic membrane and external ear normal.  Left Ear: Tympanic membrane and external ear normal.  Eyes: Pupils are equal, round, and reactive to light. EOM and lids are normal. Lids are everted and swept, no foreign bodies found. Right eye exhibits no hordeolum. No foreign body present in the right eye. Left eye exhibits discharge. Left eye exhibits no hordeolum. No foreign body present in the left eye. Right conjunctiva is not injected. Left conjunctiva is injected. No scleral icterus.  Neck: Normal range of motion. Neck  supple. Carotid bruit is not present.  Cardiovascular: Normal rate, regular rhythm, normal heart sounds, intact distal pulses and normal pulses.   Pulmonary/Chest: Breath sounds normal. No respiratory distress.  Abdominal: Soft. Bowel sounds are normal. There is no tenderness. There is no guarding.  Musculoskeletal: Normal range of motion.  Lymphadenopathy:       Head (right side): No submandibular adenopathy present.       Head (left side): No submandibular adenopathy present.    She has no cervical adenopathy.  Neurological: She is  alert and oriented to person, place, and time. She has normal strength. No cranial nerve deficit or sensory deficit.  Skin: Skin is warm and dry.  Psychiatric: She has a normal mood and affect. Her speech is normal.  Nursing note and vitals reviewed.    ED Treatments / Results  Labs (all labs ordered are listed, but only abnormal results are displayed) Labs Reviewed - No data to display  EKG  EKG Interpretation None       Radiology No results found.  Procedures Procedures (including critical care time)  Medications Ordered in ED Medications - No data to display   Initial Impression / Assessment and Plan / ED Course  I have reviewed the triage vital signs and the nursing notes.  Pertinent labs & imaging results that were available during my care of the patient were reviewed by me and considered in my medical decision making (see chart for details).       Final Clinical Impressions(s) / ED Diagnoses MDM Vital signs within normal limits. Visual acuity reviewed. Examination favors conjunctivitis involving the left eye. Patient will be treated with cool compresses and tobramycin drops. Patient is to follow-up with Dr. Lenna Gilford or return to the emergency department if not improving.    Final diagnoses:  Chronic viral conjunctivitis of left eye    New Prescriptions New Prescriptions   No medications on file     Annette Stable 12/25/16 1204    [name] Ferguson, MD 12/26/16 [phone]

## 2017-01-04 ENCOUNTER — Encounter: Payer: Self-pay | Admitting: Family

## 2017-01-04 ENCOUNTER — Ambulatory Visit (INDEPENDENT_AMBULATORY_CARE_PROVIDER_SITE_OTHER): Payer: Self-pay | Admitting: Family

## 2017-01-04 VITALS — BP 127/80 | HR 83 | Temp 97.9°F | Ht 64.0 in | Wt 204.8 lb

## 2017-01-04 DIAGNOSIS — G6289 Other specified polyneuropathies: Secondary | ICD-10-CM

## 2017-01-04 DIAGNOSIS — M5440 Lumbago with sciatica, unspecified side: Secondary | ICD-10-CM

## 2017-01-04 MED ORDER — METHYLPREDNISOLONE ACETATE 80 MG/ML IJ SUSP
80.0000 mg | Freq: Once | INTRAMUSCULAR | Status: AC
  Administered 2017-01-04: 80 mg via INTRAMUSCULAR

## 2017-01-04 MED ORDER — KETOROLAC TROMETHAMINE 60 MG/2ML IM SOLN
60.0000 mg | Freq: Once | INTRAMUSCULAR | Status: AC
  Administered 2017-01-04: 60 mg via INTRAMUSCULAR

## 2017-01-04 MED ORDER — GABAPENTIN 800 MG PO TABS: 800.0000 mg | ORAL_TABLET | Freq: Three times a day (TID) | ORAL | 2 refills | Status: DC

## 2017-01-04 MED ORDER — CYCLOBENZAPRINE HCL 10 MG PO TABS: 10.0000 mg | ORAL_TABLET | Freq: Three times a day (TID) | ORAL | 0 refills | Status: DC | PRN

## 2017-01-04 NOTE — Patient Instructions (Signed)
Sciatica Sciatica is pain, numbness, weakness, or tingling along the path of the sciatic nerve. The sciatic nerve starts in the lower back and runs down the back of each leg. The nerve controls the muscles in the lower leg and in the back of the knee. It also provides feeling (sensation) to the back of the thigh, the lower leg, and the sole of the foot. Sciatica is a symptom of another medical condition that pinches or puts pressure on the sciatic nerve. Generally, sciatica only affects one side of the body. Sciatica usually goes away on its own or with treatment. In some cases, sciatica may keep coming back (recur). What are the causes? This condition is caused by pressure on the sciatic nerve, or pinching of the sciatic nerve. This may be the result of:  A disk in between the bones of the spine (vertebrae) bulging out too far (herniated disk).  Age-related changes in the spinal disks (degenerative disk disease).  A pain disorder that affects a muscle in the buttock (piriformis syndrome).  Extra bone growth (bone spur) near the sciatic nerve.  An injury or break (fracture) of the pelvis.  Pregnancy.  Tumor (rare). What increases the risk? The following factors may make you more likely to develop this condition:  Playing sports that place pressure or stress on the spine, such as football or weight lifting.  Having poor strength and flexibility.  A history of back injury.  A history of back surgery.  Sitting for long periods of time.  Doing activities that involve repetitive bending or lifting.  Obesity. What are the signs or symptoms? Symptoms can vary from mild to very severe, and they may include:  Any of these problems in the lower back, leg, hip, or buttock:  Mild tingling or dull aches.  Burning sensations.  Sharp pains.  Numbness in the back of the calf or the sole of the foot.  Leg weakness.  Severe back pain that makes movement difficult. These symptoms may  get worse when you cough, sneeze, or laugh, or when you sit or stand for long periods of time. Being overweight may also make symptoms worse. In some cases, symptoms may recur over time. How is this diagnosed? This condition may be diagnosed based on:  Your symptoms.  A physical exam. Your health care provider may ask you to do certain movements to check whether those movements trigger your symptoms.  You may have tests, including:  Blood tests.  X-rays.  MRI.  CT scan. How is this treated? In many cases, this condition improves on its own, without any treatment. However, treatment may include:  Reducing or modifying physical activity during periods of pain.  Exercising and stretching to strengthen your abdomen and improve the flexibility of your spine.  Icing and applying heat to the affected area.  Medicines that help:  To relieve pain and swelling.  To relax your muscles.  Injections of medicines that help to relieve pain, irritation, and inflammation around the sciatic nerve (steroids).  Surgery. Follow these instructions at home: Medicines   Take over-the-counter and prescription medicines only as told by your health care provider.  Do not drive or operate heavy machinery while taking prescription pain medicine. Managing pain   If directed, apply ice to the affected area.  Put ice in a plastic bag.  Place a towel between your skin and the bag.  Leave the ice on for 20 minutes, 2-3 times a day.  After icing, apply heat to the   affected area before you exercise or as often as told by your health care provider. Use the heat source that your health care provider recommends, such as a moist heat pack or a heating pad.  Place a towel between your skin and the heat source.  Leave the heat on for 20-30 minutes.  Remove the heat if your skin turns bright red. This is especially important if you are unable to feel pain, heat, or cold. You may have a greater risk of  getting burned. Activity   Return to your normal activities as told by your health care provider. Ask your health care provider what activities are safe for you.  Avoid activities that make your symptoms worse.  Take brief periods of rest throughout the day. Resting in a lying or standing position is usually better than sitting to rest.  When you rest for longer periods, mix in some mild activity or stretching between periods of rest. This will help to prevent stiffness and pain.  Avoid sitting for long periods of time without moving. Get up and move around at least one time each hour.  Exercise and stretch regularly, as told by your health care provider.  Do not lift anything that is heavier than 10 lb (4.5 kg) while you have symptoms of sciatica. When you do not have symptoms, you should still avoid heavy lifting, especially repetitive heavy lifting.  When you lift objects, always use proper lifting technique, which includes:  Bending your knees.  Keeping the load close to your body.  Avoiding twisting. General instructions   Use good posture.  Avoid leaning forward while sitting.  Avoid hunching over while standing.  Maintain a healthy weight. Excess weight puts extra stress on your back and makes it difficult to maintain good posture.  Wear supportive, comfortable shoes. Avoid wearing high heels.  Avoid sleeping on a mattress that is too soft or too hard. A mattress that is firm enough to support your back when you sleep may help to reduce your pain.  Keep all follow-up visits as told by your health care provider. This is important. Contact a health care provider if:  You have pain that wakes you up when you are sleeping.  You have pain that gets worse when you lie down.  Your pain is worse than you have experienced in the past.  Your pain lasts longer than 4 weeks.  You experience unexplained weight loss. Get help right away if:  You lose control of your bowel  or bladder (incontinence).  You have:  Weakness in your lower back, pelvis, buttocks, or legs that gets worse.  Redness or swelling of your back.  A burning sensation when you urinate. This information is not intended to replace advice given to you by your health care provider. Make sure you discuss any questions you have with your health care provider. Document Released: 05/16/2001 Document Revised: 10/26/2015 Document Reviewed: 01/29/2015 Elsevier Interactive Patient Education  2017 [employer]  

## 2017-01-04 NOTE — Progress Notes (Signed)
   Subjective:    Patient ID: Jamie Mcgee, female    DOB: 11/13/1960, 56 y.o.   MRN: [id number]  Back Pain  This is a new problem. The current episode started 1 to 4 weeks ago. The problem occurs constantly. The problem has been gradually worsening since onset. The pain is present in the lumbar spine. The pain radiates to the left thigh and right thigh. The pain is moderate. The symptoms are aggravated by standing. Associated symptoms include leg pain, numbness, tingling and weakness. Pertinent negatives include no bladder incontinence, bowel incontinence or dysuria. Risk factors include obesity. She has tried bed rest, NSAIDs and muscle relaxant for the symptoms. The treatment provided mild relief.   Pt states her neuropathy pain is constant of 9 out 10. Pt states she has increased her gabapentin to 600 mg TID with no relief.    Review of Systems  Gastrointestinal: Negative for bowel incontinence.  Genitourinary: Negative for bladder incontinence and dysuria.  Musculoskeletal: Positive for back pain.  Neurological: Positive for tingling, weakness and numbness.  All other systems reviewed and are negative.      Objective:   Physical Exam  Constitutional: She is oriented to person, place, and time. She appears well-developed and well-nourished. No distress.  HENT:  Head: Normocephalic.  Cardiovascular: Normal rate, regular rhythm, normal heart sounds and intact distal pulses.   No murmur heard. Pulmonary/Chest: Effort normal and breath sounds normal. No respiratory distress. She has no wheezes.  Abdominal: Soft. Bowel sounds are normal. She exhibits no distension. There is no tenderness.  Musculoskeletal: Normal range of motion. She exhibits no edema or tenderness.  Lower back pain with extension and with sitting. Negative SLR  Neurological: She is alert and oriented to person, place, and time.  Skin: Skin is warm and dry.  Psychiatric: She has a normal mood and affect. Her behavior  is normal. Judgment and thought content normal.  Vitals reviewed.   BP 127/80   Pulse 83   Temp 97.9 F (36.6 C) (Oral)   Ht 5\' 4"  (1.626 m)   Wt 204 lb 12.8 oz (92.9 kg)   LMP 05/25/2011   BMI 35.15 kg/m       Assessment & Plan:  1. Acute bilateral low back pain with sciatica, sciatica laterality unspecified Rest Ice and heat ROM exercises discussed RTO prn  - methylPREDNISolone acetate (DEPO-MEDROL) injection 80 mg; Inject 1 mL (80 mg total) into the muscle once. - ketorolac (TORADOL) injection 60 mg; Inject 2 mLs (60 mg total) into the muscle once. - cyclobenzaprine (FLEXERIL) 10 MG tablet; Take 1 tablet (10 mg total) by mouth 3 (three) times daily as needed for muscle spasms.  Dispense: 30 tablet; Refill: 0  2. Other polyneuropathy Gabapentin increased to 800 mg TID from 600 TID - gabapentin (NEURONTIN) 800 MG tablet; Take 1 tablet (800 mg total) by mouth 3 (three) times daily.  Dispense: 90 tablet; Refill: Mc[place]herson, FNP

## 2017-01-05 ENCOUNTER — Other Ambulatory Visit: Payer: Self-pay | Admitting: Family Medicine

## 2017-01-20 ENCOUNTER — Other Ambulatory Visit: Payer: Self-pay | Admitting: Family Medicine

## 2017-01-22 ENCOUNTER — Emergency Department (HOSPITAL_COMMUNITY): Payer: BLUE CROSS/BLUE SHIELD

## 2017-01-22 ENCOUNTER — Encounter (HOSPITAL_COMMUNITY): Payer: Self-pay | Admitting: *Deleted

## 2017-01-22 ENCOUNTER — Emergency Department (HOSPITAL_COMMUNITY)
Admission: EM | Admit: 2017-01-22 | Discharge: 2017-01-22 | Disposition: A | Discharge status: Home / Self Care | Payer: BLUE CROSS/BLUE SHIELD | Attending: Emergency Medicine | Admitting: Emergency Medicine

## 2017-01-22 DIAGNOSIS — Y9389 Activity, other specified: Secondary | ICD-10-CM | POA: Diagnosis not present

## 2017-01-22 DIAGNOSIS — S93401A Sprain of unspecified ligament of right ankle, initial encounter: Secondary | ICD-10-CM

## 2017-01-22 DIAGNOSIS — I10 Essential (primary) hypertension: Secondary | ICD-10-CM | POA: Diagnosis not present

## 2017-01-22 DIAGNOSIS — S93601A Unspecified sprain of right foot, initial encounter: Secondary | ICD-10-CM

## 2017-01-22 DIAGNOSIS — Y998 Other external cause status: Secondary | ICD-10-CM | POA: Insufficient documentation

## 2017-01-22 DIAGNOSIS — F17210 Nicotine dependence, cigarettes, uncomplicated: Secondary | ICD-10-CM | POA: Diagnosis not present

## 2017-01-22 DIAGNOSIS — Y33XXXA Other specified events, undetermined intent, initial encounter: Secondary | ICD-10-CM | POA: Insufficient documentation

## 2017-01-22 DIAGNOSIS — Y929 Unspecified place or not applicable: Secondary | ICD-10-CM | POA: Diagnosis not present

## 2017-01-22 DIAGNOSIS — M25571 Pain in right ankle and joints of right foot: Secondary | ICD-10-CM | POA: Diagnosis not present

## 2017-01-22 DIAGNOSIS — S99911A Unspecified injury of right ankle, initial encounter: Secondary | ICD-10-CM | POA: Diagnosis not present

## 2017-01-22 MED ORDER — IBUPROFEN 800 MG PO TABS
800.0000 mg | ORAL_TABLET | Freq: Once | ORAL | Status: AC
Start: 2017-01-22 — End: 2017-01-22
  Administered 2017-01-22: 800 mg via ORAL
  Filled 2017-01-22: qty 1

## 2017-01-22 MED ORDER — ACETAMINOPHEN 500 MG PO TABS
1000.0000 mg | ORAL_TABLET | Freq: Once | ORAL | Status: AC
  Administered 2017-01-22: 1000 mg via ORAL
  Filled 2017-01-22: qty 2

## 2017-01-22 NOTE — ED Notes (Signed)
Ice pack applied.

## 2017-01-22 NOTE — ED Provider Notes (Signed)
  Physical Exam  BP 134/83   Pulse 81   Temp 98.1 F (36.7 C) (Oral)   Resp 20   Ht 5\' 4"  (1.626 m)   Wt 92.5 kg (204 lb)   LMP 05/25/2011   SpO2 97%   BMI 35.02 kg/m   Physical Exam  ED Course  Procedures  MDM Negative xray. Discharge.       Davonna Belling, MD 01/22/17 [phone]

## 2017-01-22 NOTE — ED Triage Notes (Signed)
Pt c/o right foot pain that radiates to right ankle after twisting foot on step this am

## 2017-01-22 NOTE — ED Notes (Signed)
Aircast applied

## 2017-01-22 NOTE — Discharge Instructions (Addendum)
Take 2 over the counter ibuprofen tablets 3 times a day as needed. Also take tylenol 1000mg (2 extra strength) four times a day as needed.

## 2017-01-22 NOTE — ED Provider Notes (Signed)
[facility] Provider Note   CSN: [id number] Arrival date & time: 01/22/17  0610     History   Chief Complaint Chief Complaint  Patient presents with  . Foot Pain    HPI Jamie Mcgee is a 56 y.o. female.  56 yo F with a cc of R ankle pain.  Along the inside of the foot, occurred after she missed her last step this morning.  Forcecully landed on her heel.  Able to ambulate but has some pain to the area.  Denies other injury.  Worse with movement, palpation.  Has not tried anything for this.    The history is provided by the patient.  Injury  This is a new problem. The current episode started less than 1 hour ago. The problem occurs constantly. The problem has not changed since onset.Pertinent negatives include no chest pain, no headaches and no shortness of breath. The symptoms are aggravated by bending, twisting and walking. Nothing relieves the symptoms. She has tried nothing for the symptoms. The treatment provided no relief.    Past Medical History:  Diagnosis Date  . Depression    hx of  . 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 GERD (gastroesophageal reflux disease)   . HTN (hypertension)     Patient Active Problem List   Diagnosis Date Noted  . Osteopenia 11/22/2016  . Pre-diabetes 12/25/2015  . Obesity (BMI 30-39.9) 12/25/2015  . Cramp of both lower extremities 07/02/2015  . GERD (gastroesophageal reflux disease) 05/07/2015  . Vitamin D deficiency 05/07/2015  . Metabolic syndrome 12/75/1700  . Post-menopausal bleeding 05/07/2015  . Tobacco abuse 03/08/2015  . Atypical chest pain 03/08/2015  . Depressed 12/19/2013  . Essential hypertension, benign 12/19/2013    Past Surgical History:  Procedure Laterality Date  . APPENDECTOMY  1993  . CATARACT EXTRACTION  08-2010   left   . CATARACT EXTRACTION W/PHACO  06/01/2011   Procedure: CATARACT EXTRACTION PHACO AND INTRAOCULAR LENS PLACEMENT (IOC);  Surgeon: Tonny Branch;  Location: [facility];  Service: Ophthalmology;  Laterality: Right;  CDE:9.62  . CHOLECYSTECTOMY    . RENAL ARTERY ANGIOPLASTY  2006   right  . TUBAL LIGATION  1984   [facility]    OB History    No data available       Home Medications    Prior to Admission medications   Medication Sig Start Date End Date Taking? Authorizing Provider  benazepril (LOTENSIN) 40 MG tablet TAKE ONE TABLET BY MOUTH ONCE DAILY 12/18/16   Evelina Dun A, FNP  carvedilol (COREG) 6.25 MG tablet Take 1 tablet (6.25 mg total) by mouth 2 (two) times daily with a meal. 06/15/16   Dettinger, Fransisca Kaufmann, MD  citalopram (CELEXA) 20 MG tablet TAKE ONE TABLET BY MOUTH ONCE DAILY 01/22/17   Evelina Dun A, FNP  cyclobenzaprine (FLEXERIL) 10 MG tablet Take 1 tablet (10 mg total) by mouth 3 (three) times daily as needed for muscle spasms. 01/04/17   Evelina Dun A, FNP  gabapentin (NEURONTIN) 800 MG tablet Take 1 tablet (800 mg total) by mouth 3 (three) times daily. 01/04/17   Sharion Balloon, FNP  hydrochlorothiazide (HYDRODIURIL) 25 MG tablet TAKE ONE TABLET BY MOUTH ONCE DAILY 01/05/17   Sharion Balloon, FNP  Krill Oil 1000 MG CAPS Take 1,000 mg by mouth daily.    [provider]  meloxicam (MOBIC) 7.5 MG tablet Take 1 tablet (7.5 mg total) by mouth daily. Patient not taking: Reported on  01/04/2017 11/17/16   Sharion Balloon, FNP  nitroGLYCERIN (NITROSTAT) 0.4 MG SL tablet Place 1 tablet (0.4 mg total) under the tongue every 5 (five) minutes as needed. 06/15/16   Dettinger, Fransisca Kaufmann, MD  omeprazole (PRILOSEC OTC) 20 MG tablet Take 20 mg by mouth daily.    [provider]    Family History Family History  Problem Relation Age of Onset  . Coronary artery disease Father        States congenital abnormality  . Diabetes Father   . Hyperlipidemia Father   . CAD Sister        Stents   . Cancer Sister        breast  . Cancer Mother        breast  . Cancer Maternal Aunt        breast  . Cancer Maternal Grandmother         breast  . Anesthesia problems Neg Hx   . Hypotension Neg Hx   . Malignant hyperthermia Neg Hx   . Pseudochol deficiency Neg Hx     Social History Social History  Substance Use Topics  . Smoking status: Current Every Day Smoker    Packs/day: 1.00    Years: 30.00    Types: Cigarettes  . Smokeless tobacco: Never Used     Comment: pt quit smoking 2016 for 7 months but restarted last year  . Alcohol use No     Allergies   Lipitor [atorvastatin] and Naproxen   Review of Systems Review of Systems  Constitutional: Negative for chills and fever.  HENT: Negative for congestion and rhinorrhea.   Eyes: Negative for redness and visual disturbance.  Respiratory: Negative for shortness of breath and wheezing.   Cardiovascular: Negative for chest pain and palpitations.  Gastrointestinal: Negative for nausea and vomiting.  Genitourinary: Negative for dysuria and urgency.  Musculoskeletal: Positive for arthralgias. Negative for myalgias.  Skin: Negative for pallor and wound.  Neurological: Negative for dizziness and headaches.     Physical Exam Updated Vital Signs BP 125/80 (BP Location: Left Arm)   Pulse 73   Temp 98.1 F (36.7 C) (Oral)   Resp 16   Ht 5\' 4"  (1.626 m)   Wt 92.5 kg (204 lb)   LMP 05/25/2011   SpO2 97%   BMI 35.02 kg/m   Physical Exam  Constitutional: She is oriented to person, place, and time. She appears well-developed and well-nourished. No distress.  HENT:  Head: Normocephalic and atraumatic.  Eyes: Pupils are equal, round, and reactive to light. EOM are normal.  Neck: Normal range of motion. Neck supple.  Cardiovascular: Normal rate and regular rhythm.  Exam reveals no gallop and no friction rub.   No murmur heard. Pulmonary/Chest: Effort normal. She has no wheezes. She has no rales.  Abdominal: Soft. She exhibits no distension. There is no tenderness.  Musculoskeletal: She exhibits tenderness. She exhibits no edema.  Mild tenderness without  swelling.  Worst about the medial aspect along the calcaneous.  No malleolar pain.  No noted pain to the navicular or base of the fifth metatarsal. PMS intact distally.   Neurological: She is alert and oriented to person, place, and time.  Skin: Skin is warm and dry. She is not diaphoretic.  Psychiatric: She has a normal mood and affect. Her behavior is normal.  Nursing note and vitals reviewed.    ED Treatments / Results  Labs (all labs ordered are listed, but only abnormal results are displayed)  Labs Reviewed - No data to display  EKG  EKG Interpretation None       Radiology No results found.  Procedures Procedures (including critical care time)  Medications Ordered in ED Medications  acetaminophen (TYLENOL) tablet 1,000 mg (1,000 mg Oral Given 01/22/17 0629)  ibuprofen (ADVIL,MOTRIN) tablet 800 mg (800 mg Oral Given 01/22/17 4098)     Initial Impression / Assessment and Plan / ED Course  I have reviewed the triage vital signs and the nursing notes.  Pertinent labs & imaging results that were available during my care of the patient were reviewed by me and considered in my medical decision making (see chart for details).     56 yo F with a cc of right ankle pain.  Will obtain plain film. Plain films viewed by me and are negative. Will wait for final read. Place in a removable splint and give crutches.    I have discussed the diagnosis/risks/treatment options with the patient and believe the pt to be eligible for discharge home to follow-up with PCP. We also discussed returning to the ED immediately if new or worsening sx occur. We discussed the sx which are most concerning (e.g., sudden worsening pain, fever, inability to tolerate by mouth) that necessitate immediate return. Medications administered to the patient during their visit and any new prescriptions provided to the patient are listed below.  Medications given during this visit Medications  acetaminophen (TYLENOL)  tablet 1,000 mg (1,000 mg Oral Given 01/22/17 0629)  ibuprofen (ADVIL,MOTRIN) tablet 800 mg (800 mg Oral Given 01/22/17 1191)     The patient appears reasonably screen and/or stabilized for discharge and I doubt any other medical condition or other [facility] requiring further screening, evaluation, or treatment in the ED at this time prior to discharge.    Final Clinical Impressions(s) / ED Diagnoses   Final diagnoses:  Sprain of right ankle, unspecified ligament, initial encounter  Sprain of right foot, initial encounter    New Prescriptions Discharge Medication List as of 01/22/2017  7:18 AM       Deno Etienne, DO 01/24/17 4782

## 2017-03-04 ENCOUNTER — Other Ambulatory Visit: Payer: Self-pay | Admitting: Family Medicine

## 2017-03-14 ENCOUNTER — Other Ambulatory Visit: Payer: Self-pay | Admitting: Family

## 2017-04-25 ENCOUNTER — Encounter: Payer: Self-pay | Admitting: Family

## 2017-04-25 ENCOUNTER — Ambulatory Visit (INDEPENDENT_AMBULATORY_CARE_PROVIDER_SITE_OTHER): Payer: BLUE CROSS/BLUE SHIELD | Admitting: Family

## 2017-04-25 VITALS — BP 111/71 | HR 80 | Temp 97.9°F | Ht 64.0 in | Wt 205.6 lb

## 2017-04-25 DIAGNOSIS — Z8261 Family history of arthritis: Secondary | ICD-10-CM

## 2017-04-25 DIAGNOSIS — M79642 Pain in left hand: Secondary | ICD-10-CM

## 2017-04-25 DIAGNOSIS — G5603 Carpal tunnel syndrome, bilateral upper limbs: Secondary | ICD-10-CM

## 2017-04-25 DIAGNOSIS — M79641 Pain in right hand: Secondary | ICD-10-CM | POA: Diagnosis not present

## 2017-04-25 DIAGNOSIS — M2550 Pain in unspecified joint: Secondary | ICD-10-CM

## 2017-04-25 MED ORDER — MELOXICAM 15 MG PO TABS: 15.0000 mg | ORAL_TABLET | Freq: Every day | ORAL | 0 refills | Status: DC

## 2017-04-25 NOTE — Progress Notes (Signed)
   Subjective:    Patient ID: Jamie Mcgee, female    DOB: 11-08-1960, 56 y.o.   MRN: [id number]  Pt presents to the office today with bilateral hand that started 3 months ago that has become worse. PT states she has pain "all over" in her back, shoulders, lower back, and feet.   Pt has tried motrin with no relief. States driving makes this pain worse.  Mother had RA. Pt does repetitive motions at work.  Hand Pain   The pain is at a severity of 9/10.      Review of Systems  Musculoskeletal: Positive for arthralgias.  All other systems reviewed and are negative.      Objective:   Physical Exam  Constitutional: She is oriented to person, place, and time. She appears well-developed and well-nourished. No distress.  HENT:  Head: Normocephalic and atraumatic.  Right Ear: External ear normal.  Left Ear: External ear normal.  Nose: Nose normal.  Mouth/Throat: Oropharynx is clear and moist.  Eyes: Pupils are equal, round, and reactive to light.  Neck: Normal range of motion. Neck supple. No thyromegaly present.  Cardiovascular: Normal rate, regular rhythm, normal heart sounds and intact distal pulses.  No murmur heard. Pulmonary/Chest: Effort normal and breath sounds normal. No respiratory distress. She has no wheezes.  Abdominal: Soft. Bowel sounds are normal. She exhibits no distension. There is no tenderness.  Musculoskeletal: Normal range of motion. She exhibits no edema or tenderness.  tinel and phalen positive   Neurological: She is alert and oriented to person, place, and time.  Skin: Skin is warm and dry.  Psychiatric: She has a normal mood and affect. Her behavior is normal. Judgment and thought content normal.  Vitals reviewed.   BP 111/71   Pulse 80   Temp 97.9 F (36.6 C) (Oral)   Ht '5\' 4"'$  (1.626 m)   Wt 205 lb 9.6 oz (93.3 kg)   LMP 05/25/2011   BMI 35.29 kg/m      Assessment & Plan:  1. Pain in both hands - Arthritis Panel - BMP8+EGFR  2.  Generalized joint pain - Arthritis Panel - BMP8+EGFR  3. Family history of rheumatoid arthritis - Arthritis Panel - BMP8+EGFR  4. Bilateral carpal tunnel syndrome Rest Ice  Wear bilateral wrist splints  Start Mobic- stop if causes stomach pains If continues will send to [facility]  - meloxicam (MOBIC) 15 MG tablet; Take 1 tablet (15 mg total) by mouth daily.  Dispense: 30 tablet; Refill: 0  Labs pending for RA RTO prn and keep follow up appt  Evelina Dun, FNP

## 2017-04-25 NOTE — Patient Instructions (Signed)
Carpal Tunnel Release Carpal tunnel release is a surgical procedure to relieve numbness and pain in your hand that are caused by carpal tunnel syndrome. Your carpal tunnel is a narrow, hollow space in your wrist. It passes between your wrist bones and a band of connective tissue (transverse carpal ligament). The nerve that supplies most of your hand (median nerve) passes through this space, and so do the connections between your fingers and the muscles of your arm (tendons). Carpal tunnel syndrome makes this space swell and become narrow, and this causes pain and numbness. In carpal tunnel release surgery, a surgeon cuts through the transverse carpal ligament to make more room in the carpal tunnel space. You may have this surgery if other types of treatment have not worked. Tell a health care provider about:  Any allergies you have.  All medicines you are taking, including vitamins, herbs, eye drops, creams, and over-the-counter medicines.  Any problems you or family members have had with anesthetic medicines.  Any blood disorders you have.  Any surgeries you have had.  Any medical conditions you have. What are the risks? Generally, this is a safe procedure. However, problems may occur, including:  Bleeding.  Infection.  Injury to the median nerve.  Need for additional surgery.  What happens before the procedure?  Ask your health care provider about: ? Changing or stopping your regular medicines. This is especially important if you are taking diabetes medicines or blood thinners. ? Taking medicines such as aspirin and ibuprofen. These medicines can thin your blood. Do not take these medicines before your procedure if your health care provider instructs you not to.  Do not eat or drink anything after midnight on the night before the procedure or as directed by your health care provider.  Plan to have someone take you home after the procedure. What happens during the  procedure?  An IV tube may be inserted into a vein.  You will be given one of the following: ? A medicine that numbs the wrist area (local anesthetic). You may also be given a medicine to make you relax (sedative). ? A medicine that makes you go to sleep (general anesthetic).  Your arm, hand, and wrist will be cleaned with a germ-killing solution (antiseptic).  Your surgeon will make a surgical cut (incision) over the palm side of your wrist. The surgeon will pull aside the skin of your wrist to expose the carpal tunnel space.  The surgeon will cut the transverse carpal ligament.  The edges of the incision will be closed with stitches (sutures) or staples.  A bandage (dressing) will be placed over your wrist and wrapped around your hand and wrist. What happens after the procedure?  You may spend some time in a recovery area.  Your blood pressure, heart rate, breathing rate, and blood oxygen level will be monitored often until the medicines you were given have worn off.  You will likely have some pain. You will be given pain medicine.  You may need to wear a splint or a wrist brace over your dressing. This information is not intended to replace advice given to you by your health care provider. Make sure you discuss any questions you have with your health care provider. Document Released: 08/12/2003 Document Revised: 10/28/2015 Document Reviewed: 01/07/2014 Elsevier Interactive Patient Education  2017 [employer].

## 2017-04-26 LAB — BMP8+EGFR
BUN/Creatinine Ratio: 10 (ref 9–23)
BUN: 8 mg/dL (ref 6–24)
CALCIUM: 9.8 mg/dL (ref 8.7–10.2)
CHLORIDE: 96 mmol/L (ref 96–106)
CO2: 29 mmol/L (ref 20–29)
Creatinine, Ser: 0.81 mg/dL (ref 0.57–1.00)
GFR calc non Af Amer: 81 mL/min/{1.73_m2} (ref >59)
GFR, EST AFRICAN AMERICAN: 93 mL/min/{1.73_m2} (ref >59)
GLUCOSE: 73 mg/dL (ref 65–99)
POTASSIUM: 4 mmol/L (ref 3.5–5.2)
Sodium: 142 mmol/L (ref 134–144)

## 2017-04-26 LAB — ARTHRITIS PANEL
BASOS ABS: 0 10*3/uL (ref 0.0–0.2)
Basos: 0 %
EOS (ABSOLUTE): 0.2 10*3/uL (ref 0.0–0.4)
Eos: 2 %
HEMOGLOBIN: 15.2 g/dL (ref 11.1–15.9)
Hematocrit: 45.1 % (ref 34.0–46.6)
IMMATURE GRANS (ABS): 0.1 10*3/uL (ref 0.0–0.1)
IMMATURE GRANULOCYTES: 1 %
LYMPHS: 32 %
Lymphocytes Absolute: 3.5 10*3/uL — ABNORMAL HIGH (ref 0.7–3.1)
MCH: 30.2 pg (ref 26.6–33.0)
MCHC: 33.7 g/dL (ref 31.5–35.7)
MCV: 90 fL (ref 79–97)
MONOCYTES: 9 %
Monocytes Absolute: 0.9 10*3/uL (ref 0.1–0.9)
NEUTROS ABS: 6.1 10*3/uL (ref 1.4–7.0)
NEUTROS PCT: 56 %
Platelets: 284 10*3/uL (ref 150–379)
RBC: 5.03 x10E6/uL (ref 3.77–5.28)
RDW: 13.2 % (ref 12.3–15.4)
SED RATE: 19 mm/h (ref 0–40)
URIC ACID: 8.2 mg/dL — AB (ref 2.5–7.1)
WBC: 10.8 10*3/uL (ref 3.4–10.8)

## 2017-05-03 ENCOUNTER — Telehealth: Payer: Self-pay | Admitting: Family

## 2017-05-03 NOTE — Telephone Encounter (Signed)
Pt has appt for 05/04/17

## 2017-05-03 NOTE — Telephone Encounter (Signed)
What symptoms do you have? Cannot urinate back pain uti  How long have you been sick? 2 days  Have you been seen for this problem? yes  If your provider decides to give you a prescription, which pharmacy would you like for it to be sent to? [facility] in [place]   Patient informed that this information will be sent to the clinical staff for review and that they should receive a follow up call.

## 2017-05-04 ENCOUNTER — Ambulatory Visit (INDEPENDENT_AMBULATORY_CARE_PROVIDER_SITE_OTHER): Payer: BLUE CROSS/BLUE SHIELD | Admitting: Family Medicine

## 2017-05-04 ENCOUNTER — Ambulatory Visit (INDEPENDENT_AMBULATORY_CARE_PROVIDER_SITE_OTHER): Payer: BLUE CROSS/BLUE SHIELD

## 2017-05-04 ENCOUNTER — Encounter: Payer: Self-pay | Admitting: Family Medicine

## 2017-05-04 VITALS — BP 133/76 | HR 69 | Temp 97.3°F | Ht 64.0 in | Wt 207.0 lb

## 2017-05-04 DIAGNOSIS — R399 Unspecified symptoms and signs involving the genitourinary system: Secondary | ICD-10-CM

## 2017-05-04 DIAGNOSIS — R109 Unspecified abdominal pain: Secondary | ICD-10-CM | POA: Diagnosis not present

## 2017-05-04 LAB — URINALYSIS
Bilirubin, UA: NEGATIVE
GLUCOSE, UA: NEGATIVE
Ketones, UA: NEGATIVE
Leukocytes, UA: NEGATIVE
Nitrite, UA: NEGATIVE
Protein, UA: NEGATIVE
RBC, UA: NEGATIVE
Specific Gravity, UA: 1.02 (ref 1.005–1.030)
UUROB: 0.2 mg/dL (ref 0.2–1.0)
pH, UA: 5.5 (ref 5.0–7.5)

## 2017-05-04 MED ORDER — CYCLOBENZAPRINE HCL 5 MG PO TABS: 5.0000 mg | ORAL_TABLET | Freq: Three times a day (TID) | ORAL | 0 refills | Status: DC | PRN

## 2017-05-04 MED ORDER — PREDNISONE 10 MG (21) PO TBPK: ORAL_TABLET | Freq: Every day | ORAL | 0 refills | Status: DC

## 2017-05-04 NOTE — Progress Notes (Signed)
Chief Complaint  Patient presents with  . Back Pain    pt here today c/o lower back pain "feels like someone punched me"    HPI  Patient presents today for left-sided back pain for 2 days.  Patient points to the left flank.  It is up higher in the back just inferior to the angle of the scapula..  She has mild nausea going with as well.  It is increased when she stands up for a period of time.  It helps when she sits for a while.  However if she sits for all longer time it will start hurting again and she will have to reposition.  She cannot lay down on her right side because it will start pulling.  But it feels a little better if she lays on her left.  She has a little bit of nausea with it but no vomiting no constipation or diarrhea.  She does note that her energy has been poor for the last several weeks.  She just goes to work and comes on the legs down.  However, she notes that she works long hours and has a long drive to and from work frequently.  Patient's had no fever chills or sweats.  She denies dysuria and frequency of urination.  There is chronic low back pain but no change in character of that recently and in fact it may actually be a little better than the norm.  PMH: Smoking status noted XNA:TFTDDU of Systems  Constitutional: Positive for activity change. Negative for appetite change and fever.  HENT: Negative for congestion, rhinorrhea and sore throat.   Eyes: Negative for visual disturbance.  Respiratory: Negative for cough and shortness of breath.   Cardiovascular: Negative for chest pain and palpitations.  Gastrointestinal: Positive for abdominal pain and nausea. Negative for blood in stool, constipation, diarrhea and rectal pain.  Genitourinary: Positive for flank pain. Negative for dysuria.  Musculoskeletal: Negative for arthralgias and myalgias.  Psychiatric/Behavioral: Negative for agitation.    Objective: BP 133/76   Pulse 69   Temp (!) 97.3 F (36.3 C) (Oral)   Ht  5\' 4"  (1.626 m)   Wt 207 lb (93.9 kg)   LMP 05/25/2011   BMI 35.53 kg/m  Gen: NAD, alert, cooperative with exam HEENT: NCAT, EOMI, PERRL CV: RRR, good S1/S2, no murmur Resp: CTABL, no wheezes, non-labored Abd: Soft nontender bowel sounds normal.  However the left flank is moderately tender at the posterior axillary line just inferior to the left angle of the scapula.  It does not radiate.  Straight leg raise is negative.  Ext: No edema, warm Neuro: Alert and oriented, No gross deficits Abdominal x-ray: No acute pathology noted no kidney stone or ureteral stone identified. Urinalysis: No blood, no leukocytes, no protein, culture ordered Assessment and plan:  1. UTI symptoms   2. Abdominal pain, unspecified abdominal location   3. Flank pain, acute     Meds ordered this encounter  Medications  . predniSONE (STERAPRED UNI-PAK 21 TAB) 10 MG (21) TBPK tablet    Sig: Take by mouth daily. As directed x 6 days    Dispense:  21 tablet    Refill:  0  . cyclobenzaprine (FLEXERIL) 5 MG tablet    Sig: Take 1 tablet (5 mg total) by mouth 3 (three) times daily as needed for muscle spasms.    Dispense:  30 tablet    Refill:  0    Orders Placed This Encounter  Procedures  .  Urine Culture  . DG Abd 2 Views    Standing Status:   Future    Number of Occurrences:   1    Standing Expiration Date:   07/04/2018    Order Specific Question:   Reason for Exam (SYMPTOM  OR DIAGNOSIS REQUIRED)    Answer:   adominal pain    Order Specific Question:   Is patient pregnant?    Answer:   No    Order Specific Question:   Preferred imaging location?    Answer:   Internal    Order Specific Question:   Radiology Contrast Protocol - do NOT remove file path    Answer:   [email or web address]  . Urinalysis    Follow up for change or worsening symptoms  Claretta Fraise, MD

## 2017-05-05 LAB — URINE CULTURE

## 2017-05-06 ENCOUNTER — Other Ambulatory Visit: Payer: Self-pay | Admitting: Family

## 2017-05-06 DIAGNOSIS — G6289 Other specified polyneuropathies: Secondary | ICD-10-CM

## 2017-05-16 ENCOUNTER — Ambulatory Visit: Payer: BLUE CROSS/BLUE SHIELD | Admitting: Family Medicine

## 2017-05-24 ENCOUNTER — Other Ambulatory Visit: Payer: Self-pay | Admitting: Family

## 2017-05-24 DIAGNOSIS — G5603 Carpal tunnel syndrome, bilateral upper limbs: Secondary | ICD-10-CM

## 2017-06-10 ENCOUNTER — Other Ambulatory Visit: Payer: Self-pay | Admitting: Family

## 2017-06-11 DIAGNOSIS — F17200 Nicotine dependence, unspecified, uncomplicated: Secondary | ICD-10-CM | POA: Diagnosis not present

## 2017-06-11 DIAGNOSIS — R05 Cough: Secondary | ICD-10-CM | POA: Diagnosis not present

## 2017-06-11 DIAGNOSIS — Z79899 Other long term (current) drug therapy: Secondary | ICD-10-CM | POA: Diagnosis not present

## 2017-06-11 DIAGNOSIS — Z720 Tobacco use: Secondary | ICD-10-CM | POA: Diagnosis not present

## 2017-06-11 DIAGNOSIS — J209 Acute bronchitis, unspecified: Secondary | ICD-10-CM | POA: Diagnosis not present

## 2017-06-11 DIAGNOSIS — R0602 Shortness of breath: Secondary | ICD-10-CM | POA: Diagnosis not present

## 2017-06-11 DIAGNOSIS — I10 Essential (primary) hypertension: Secondary | ICD-10-CM | POA: Diagnosis not present

## 2017-06-23 ENCOUNTER — Other Ambulatory Visit: Payer: Self-pay | Admitting: Family Medicine

## 2017-06-23 ENCOUNTER — Other Ambulatory Visit: Payer: Self-pay | Admitting: Family

## 2017-06-23 DIAGNOSIS — G5603 Carpal tunnel syndrome, bilateral upper limbs: Secondary | ICD-10-CM

## 2017-06-27 NOTE — Progress Notes (Signed)
Subjective: CC: back pain/ sob PCP: Sharion Balloon, FNP CVE:[id number] Jamie Mcgee is a 57 y.o. female presenting to clinic today for:  1. Back pain Patient reports a 3-week history of intermittent back pain that occurs between bilateral shoulder blades.  She denies upper extremity weakness, numbness or tingling.  She denies preceding injury.  Denies pleuritic chest pain or shortness of breath at rest.  Denies heart palpitations, lower extremity swelling, calf pain.  2. SOB Patient reports that she has had a long-standing history of dyspnea on exertion.  She seems to note that it is worse over the last several months.  Denies lower extremity edema, orthopnea.  She does report intermittent substernal chest pain which she has had for years.  Has been more notable over the last 2-3 weeks.  Chest pain is intermittent and random.  Not associated with physical activity.  Denies associated nausea, vomiting, diaphoresis.  She notes that she was worked up for atypical chest pain in the past.  She had a nuclear stress test performed, which was considered low risk.  Last visit to cardiology was in 2016 with Dr. Bronson Ing.  She was recommended to follow-up in 3 months.  It does not appear that she did this.  Social history significant for every day smoker.  She smokes half pack per day.  No known diagnosis of COPD or asthma.  She does note a productive cough over the last couple of weeks.  Denies hemoptysis.  Family history significant for early cardiovascular disease in her father, who died of MI at 49 and her sister who had her first heart attack at 24.   ROS: Per HPI  Allergies  Allergen Reactions  . Lipitor [Atorvastatin] Other (See Comments)    Muscle aches and nausea  . Naproxen     Stomach pain   Past Medical History:  Diagnosis Date  . Depression    hx of  . 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 GERD (gastroesophageal reflux disease)   . HTN (hypertension)     Current  Outpatient Medications:  .  benazepril (LOTENSIN) 40 MG tablet, TAKE 1 TABLET BY MOUTH ONCE DAILY, Disp: 90 tablet, Rfl: 0 .  carvedilol (COREG) 6.25 MG tablet, TAKE 1 TABLET BY MOUTH TWICE DAILY WITH  A  MEAL, Disp: 180 tablet, Rfl: 0 .  citalopram (CELEXA) 20 MG tablet, TAKE ONE TABLET BY MOUTH ONCE DAILY, Disp: 90 tablet, Rfl: 1 .  gabapentin (NEURONTIN) 800 MG tablet, TAKE 1 TABLET BY MOUTH THREE TIMES DAILY, Disp: 90 tablet, Rfl: 2 .  hydrochlorothiazide (HYDRODIURIL) 25 MG tablet, TAKE 1 TABLET BY MOUTH ONCE DAILY, Disp: 90 tablet, Rfl: 0 .  meloxicam (MOBIC) 15 MG tablet, TAKE 1 TABLET BY MOUTH ONCE DAILY, Disp: 30 tablet, Rfl: 0 .  nitroGLYCERIN (NITROSTAT) 0.4 MG SL tablet, Place 1 tablet (0.4 mg total) under the tongue every 5 (five) minutes as needed., Disp: 25 tablet, Rfl: 3 .  omeprazole (PRILOSEC OTC) 20 MG tablet, Take 20 mg by mouth daily., Disp: , Rfl:  Social History   Socioeconomic History  . Marital status: Divorced    Spouse name: Not on file  . Number of children: Not on file  . Years of education: Not on file  . Highest education level: Not on file  Social Needs  . Financial resource strain: Not on file  . Food insecurity - worry: Not on file  . Food insecurity - inability: Not on file  . Transportation needs -  medical: Not on file  . Transportation needs - non-medical: Not on file  Occupational History  . Not on file  Tobacco Use  . Smoking status: Current Every Day Smoker    Packs/day: 1.00    Years: 30.00    Pack years: 30.00    Types: Cigarettes  . Smokeless tobacco: Never Used  . Tobacco comment: pt quit smoking 2016 for 7 months but restarted last year  Substance and Sexual Activity  . Alcohol use: No    Alcohol/week: 0.0 oz  . Drug use: No  . Sexual activity: Yes    Birth control/protection: None, Post-menopausal  Other Topics Concern  . Not on file  Social History Narrative   The patient does not have any sex drive and has not had one for  almost a year.   Her   husband thinks she does not love him anymore.  He does not seem to understand   her   depression.   Family History  Problem Relation Age of Onset  . Coronary artery disease Father        States congenital abnormality  . Diabetes Father   . Hyperlipidemia Father   . CAD Sister        Stents   . Cancer Sister        breast  . Cancer Mother        breast  . Cancer Maternal Aunt        breast  . Cancer Maternal Grandmother        breast  . Anesthesia problems Neg Hx   . Hypotension Neg Hx   . Malignant hyperthermia Neg Hx   . Pseudochol deficiency Neg Hx     Objective: Office vital signs reviewed. BP 128/82   Pulse 80   Temp 97.9 F (36.6 C) (Oral)   Ht 5' 4"  (1.626 m)   Wt 205 lb (93 kg)   LMP 05/25/2011   BMI 35.19 kg/m   Physical Examination:  General: Awake, alert, obese, No acute distress HEENT: sclera white, MMM, no JVD, no carotid bruits. Cardio: regular rate and rhythm, S1S2 heard, no murmurs appreciated Pulm: Globally decreased breath sounds. no wheezes, rhonchi or rales; normal work of breathing on room air Ext: Warm, well-perfused, no edema.  Assessment/ Plan: 57 y.o. female   1. Atypical chest pain Sees have been ongoing since 2016.  She previously had a negative workup.  Chest x-ray was unremarkable.  EKG with T wave inversions in V 4, 5 and 6.  This is new since 2016.  She does have a heart score of 5.  I did consider referring her to the emergency department for rule out but she was actually able to secure a visit with cardiology today at 1 PM.  Given duration of symptoms I think that ACS is unlikely but she does deserve further cardiac workup, particularly because she is an active smoker and has early heart disease in first-degree relatives.  Would likely benefit from a new stress test.  Doubt PE but will r/o w/ DDimer.   - EKG 12-Lead - DG Chest 2 View; Future - Ambulatory referral to Cardiology  2. Dyspnea on  exertion Possibly related to deconditioning versus undiagnosed COPD versus cardiac etiology.  She has an appointment with cardiology as above.  Will refer to pulmonology as well for PFTs.  CMP, BNP, CBC, d-dimer obtained today. - DG Chest 2 View; Future - CMP14+EGFR - Brain natriuretic peptide - D-dimer, quantitative (not at [facility]) -  CBC - Ambulatory referral to Cardiology - Ambulatory referral to Pulmonology  3. Productive cough - DG Chest 2 View; Future  4. Cigarette smoker Smoking cessation recommended.  Patient is in the active phase of reducing/discontinuing smoking. - Ambulatory referral to Pulmonology   Orders Placed This Encounter  Procedures  . DG Chest 2 View    Standing Status:   Future    Standing Expiration Date:   08/28/2018    Order Specific Question:   Reason for Exam (SYMPTOM  OR DIAGNOSIS REQUIRED)    Answer:   Dyspnea on exertion, productive cough, active smoker    Order Specific Question:   Is patient pregnant?    Answer:   No    Order Specific Question:   Preferred imaging location?    Answer:   Internal    Order Specific Question:   Radiology Contrast Protocol - do NOT remove file path    Answer:   [email or web address]  . CMP14+EGFR  . Brain natriuretic peptide  . D-dimer, quantitative (not at [facility])  . CBC  . Ambulatory referral to Cardiology    Referral Priority:   Routine    Referral Type:   Consultation    Referral Reason:   Specialty Services Required    Requested Specialty:   Cardiology    Number of Visits Requested:   1  . Ambulatory referral to Pulmonology    Referral Priority:   Routine    Referral Type:   Consultation    Referral Reason:   Specialty Services Required    Requested Specialty:   Pulmonary Disease    Number of Visits Requested:   1  . EKG 12-Lead   Strict return precautions and reasons for emergent evaluation in the emergency department review with patient.  They voiced understanding and  will follow-up as needed.   Jamie Norlander, DO [place] [phone]

## 2017-06-29 ENCOUNTER — Ambulatory Visit: Payer: BLUE CROSS/BLUE SHIELD | Admitting: Family Medicine

## 2017-06-29 ENCOUNTER — Encounter: Payer: Self-pay | Admitting: Cardiovascular Disease

## 2017-06-29 ENCOUNTER — Ambulatory Visit: Payer: BLUE CROSS/BLUE SHIELD | Admitting: Cardiovascular Disease

## 2017-06-29 ENCOUNTER — Encounter: Payer: Self-pay | Admitting: Family Medicine

## 2017-06-29 ENCOUNTER — Ambulatory Visit (INDEPENDENT_AMBULATORY_CARE_PROVIDER_SITE_OTHER): Payer: BLUE CROSS/BLUE SHIELD

## 2017-06-29 VITALS — BP 128/82 | HR 80 | Temp 97.9°F | Ht 64.0 in | Wt 205.0 lb

## 2017-06-29 VITALS — BP 132/82 | HR 89 | Ht 64.0 in | Wt 203.8 lb

## 2017-06-29 DIAGNOSIS — R05 Cough: Secondary | ICD-10-CM

## 2017-06-29 DIAGNOSIS — R0789 Other chest pain: Secondary | ICD-10-CM | POA: Diagnosis not present

## 2017-06-29 DIAGNOSIS — R0609 Other forms of dyspnea: Secondary | ICD-10-CM | POA: Diagnosis not present

## 2017-06-29 DIAGNOSIS — Z720 Tobacco use: Secondary | ICD-10-CM | POA: Diagnosis not present

## 2017-06-29 DIAGNOSIS — R058 Other specified cough: Secondary | ICD-10-CM

## 2017-06-29 DIAGNOSIS — R0600 Dyspnea, unspecified: Secondary | ICD-10-CM

## 2017-06-29 DIAGNOSIS — F17210 Nicotine dependence, cigarettes, uncomplicated: Secondary | ICD-10-CM | POA: Diagnosis not present

## 2017-06-29 DIAGNOSIS — I10 Essential (primary) hypertension: Secondary | ICD-10-CM | POA: Diagnosis not present

## 2017-06-29 DIAGNOSIS — R079 Chest pain, unspecified: Secondary | ICD-10-CM | POA: Diagnosis not present

## 2017-06-29 NOTE — Progress Notes (Signed)
SUBJECTIVE: Patient is a 57 year old woman who presents for evaluation of an abnormal ECG.  I saw her over 2 years ago when I evaluated her for chest pain.  At that time she underwent a low risk nuclear stress test and an unremarkable echocardiogram.  She has a long history of tobacco abuse.  She also has hypertension.  I reviewed notes from her PCP, Dr. Lajuana Ripple, whom she saw her earlier today.  She has a long-standing history of exertional dyspnea but has noticed it has been getting worse over the last few months.  She is also had more frequent chest pain over the last 2-3 weeks.  She denies orthopnea, leg swelling, and paroxysmal nocturnal dyspnea.  For the chest pain has no particular aggravating or alleviating factors and that it is not associated with exertion.  Several labs were ordered and are yet to be performed including a CBC, d-dimer, BNP, and comprehensive metabolic panel.  I reviewed the chest x-ray performed today which demonstrated no edema or consolidation.  I reviewed the ECG performed today which demonstrated sinus rhythm with baseline artifact and nonspecific T wave abnormalities, particularly in leads I and aVL, with T wave inversions in leads V4 through V6.  I compared this to an ECG performed on 03/09/15 which did not have these T wave abnormalities.  She has been having progressive exertional dyspnea with simple activities such as showering, walking, vacuuming, and sweeping.  This is been affecting her work life.  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  Additional complaints relate to bilateral feet neuropathy.  She also describes pain between her shoulder blades.  She has episodic retrosternal chest discomfort which does radiate to the back.      Family history: Father died of an MI at the age of 25 and sister had first MI at the age of 74.   Review of Systems: As per "subjective", otherwise negative.  Allergies  Allergen Reactions  . Lipitor  [Atorvastatin] Other (See Comments)    Muscle aches and nausea  . Naproxen     Stomach pain    Current Outpatient Medications  Medication Sig Dispense Refill  . benazepril (LOTENSIN) 40 MG tablet TAKE 1 TABLET BY MOUTH ONCE DAILY 90 tablet 0  . carvedilol (COREG) 6.25 MG tablet TAKE 1 TABLET BY MOUTH TWICE DAILY WITH  A  MEAL 180 tablet 0  . citalopram (CELEXA) 20 MG tablet TAKE ONE TABLET BY MOUTH ONCE DAILY 90 tablet 1  . gabapentin (NEURONTIN) 800 MG tablet TAKE 1 TABLET BY MOUTH THREE TIMES DAILY 90 tablet 2  . hydrochlorothiazide (HYDRODIURIL) 25 MG tablet TAKE 1 TABLET BY MOUTH ONCE DAILY 90 tablet 0  . meloxicam (MOBIC) 15 MG tablet TAKE 1 TABLET BY MOUTH ONCE DAILY 30 tablet 0  . nitroGLYCERIN (NITROSTAT) 0.4 MG SL tablet Place 1 tablet (0.4 mg total) under the tongue every 5 (five) minutes as needed. 25 tablet 3  . omeprazole (PRILOSEC OTC) 20 MG tablet Take 20 mg by mouth daily.     No current facility-administered medications for this visit.     Past Medical History:  Diagnosis Date  . Depression    hx of  . 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 GERD (gastroesophageal reflux disease)   . HTN (hypertension)   . Neuropathy     Past Surgical History:  Procedure Laterality Date  . APPENDECTOMY  1993  . CATARACT EXTRACTION  08-2010   left   .  CATARACT EXTRACTION W/PHACO  06/01/2011   Procedure: CATARACT EXTRACTION PHACO AND INTRAOCULAR LENS PLACEMENT (IOC);  Surgeon: Tonny Branch;  Location: [facility];  Service: Ophthalmology;  Laterality: Right;  CDE:9.62  . CHOLECYSTECTOMY    . RENAL ARTERY ANGIOPLASTY  2006   right  . TUBAL LIGATION  1984   [facility]    Social History   Socioeconomic History  . Marital status: Divorced    Spouse name: Not on file  . Number of children: Not on file  . Years of education: Not on file  . Highest education level: Not on file  Social Needs  . Financial resource strain: Not on file  . Food insecurity - worry: Not on file    . Food insecurity - inability: Not on file  . Transportation needs - medical: Not on file  . Transportation needs - non-medical: Not on file  Occupational History  . Not on file  Tobacco Use  . Smoking status: Current Every Day Smoker    Packs/day: 1.00    Years: 30.00    Pack years: 30.00    Types: Cigarettes  . Smokeless tobacco: Never Used  . Tobacco comment: pt quit smoking 2016 for 7 months but restarted last year  Substance and Sexual Activity  . Alcohol use: No    Alcohol/week: 0.0 oz  . Drug use: No  . Sexual activity: Yes    Birth control/protection: None, Post-menopausal  Other Topics Concern  . Not on file  Social History Narrative   The patient does not have any sex drive and has not had one for almost a year.   Her   husband thinks she does not love him anymore.  He does not seem to understand   her   depression.     Vitals:   06/29/17 1248 06/29/17 1253  BP: 126/80 132/82  Pulse: 89   SpO2: 95%   Weight: 203 lb 12.8 oz (92.4 kg)   Height: 5\' 4"  (1.626 m)     Wt Readings from Last 3 Encounters:  06/29/17 203 lb 12.8 oz (92.4 kg)  06/29/17 205 lb (93 kg)  05/04/17 207 lb (93.9 kg)     PHYSICAL EXAM General: NAD HEENT: Normal. Neck: No JVD, no thyromegaly. Lungs: Clear to auscultation bilaterally with normal respiratory effort. CV: Regular rate and rhythm, normal S1/S2, no S3/S4, no murmur. No pretibial or periankle edema.  No carotid bruit.   Abdomen: Soft, nontender, no distention.  Neurologic: Alert and oriented.  Psych: Normal affect. Skin: Normal. Musculoskeletal: Tenderness along left infrascapular region.    ECG: Most recent ECG reviewed.   Labs: Lab Results  Component Value Date/Time   K 4.0 04/25/2017 11:43 AM   BUN 8 04/25/2017 11:43 AM   CREATININE 0.81 04/25/2017 11:43 AM   ALT 25 10/31/2016 12:46 PM   TSH 1.650 10/31/2016 12:46 PM   HGB 15.2 04/25/2017 11:43 AM     Lipids: Lab Results  Component Value Date/Time    LDLCALC 86 10/31/2016 12:46 PM   CHOL 172 10/31/2016 12:46 PM   TRIG 252 (H) 10/31/2016 12:46 PM   HDL 36 (L) 10/31/2016 12:46 PM       ASSESSMENT AND PLAN: 1.  Chest pain: Symptoms are somewhat atypical for ischemic heart disease in that there are no exertional components.  Pertinent risk factors include tobacco abuse, hypertension, and a family history of premature coronary disease.  It would be reasonable to proceed with a stress test.  I will obtain a  Lexiscan Myoview.  2.  Progressive exertional dyspnea: She has been referred to pulmonology for pulmonary function testing given her long history of tobacco abuse and probable COPD.  Chest x-ray today was unrevealing.  Blood tests are pending.  In order to exclude ischemic heart disease as a potential etiology, I will obtain a Lexiscan Myoview stress test.  3.  Hypertension: Blood pressure is controlled on present therapy.  No changes.  4.  Tobacco abuse: She is trying to quit.  She used to smoke over a pack of cigarettes daily and is now down to 1/2 pack daily.   Disposition: Follow up 4-6 weeks   Kate Sable, M.D., F.A.C.C.

## 2017-06-29 NOTE — Patient Instructions (Signed)
Your physician recommends that you schedule a follow-up appointment in: 4-6 weeks with [place]    Your physician recommends that you continue on your current medications as directed. Please refer to the Current Medication list given to you today.    Your physician has requested that you have a lexiscan myoview. For further information please visit [email or web address]. Please follow instruction sheet, as given.     No lab work ordered today.      Thank you for choosing [facility] !

## 2017-06-30 LAB — CMP14+EGFR
A/G RATIO: 1.2 (ref 1.2–2.2)
ALBUMIN: 3.8 g/dL (ref 3.5–5.5)
ALT: 43 IU/L — ABNORMAL HIGH (ref 0–32)
AST: 53 IU/L — ABNORMAL HIGH (ref 0–40)
Alkaline Phosphatase: 94 IU/L (ref 39–117)
BILIRUBIN TOTAL: 0.3 mg/dL (ref 0.0–1.2)
BUN / CREAT RATIO: 9 (ref 9–23)
BUN: 8 mg/dL (ref 6–24)
CALCIUM: 9.2 mg/dL (ref 8.7–10.2)
CHLORIDE: 100 mmol/L (ref 96–106)
CO2: 23 mmol/L (ref 20–29)
Creatinine, Ser: 0.88 mg/dL (ref 0.57–1.00)
GFR, EST AFRICAN AMERICAN: 84 mL/min/{1.73_m2} (ref >59)
GFR, EST NON AFRICAN AMERICAN: 73 mL/min/{1.73_m2} (ref >59)
GLOBULIN, TOTAL: 3.1 g/dL (ref 1.5–4.5)
Glucose: 97 mg/dL (ref 65–99)
POTASSIUM: 4.4 mmol/L (ref 3.5–5.2)
SODIUM: 139 mmol/L (ref 134–144)
TOTAL PROTEIN: 6.9 g/dL (ref 6.0–8.5)

## 2017-06-30 LAB — CBC
HEMATOCRIT: 44.4 % (ref 34.0–46.6)
Hemoglobin: 14.9 g/dL (ref 11.1–15.9)
MCH: 30.2 pg (ref 26.6–33.0)
MCHC: 33.6 g/dL (ref 31.5–35.7)
MCV: 90 fL (ref 79–97)
PLATELETS: 330 10*3/uL (ref 150–379)
RBC: 4.93 x10E6/uL (ref 3.77–5.28)
RDW: 13.8 % (ref 12.3–15.4)
WBC: 7.1 10*3/uL (ref 3.4–10.8)

## 2017-06-30 LAB — BRAIN NATRIURETIC PEPTIDE: BNP: 45.6 pg/mL (ref 0.0–100.0)

## 2017-06-30 LAB — D-DIMER, QUANTITATIVE: D-DIMER: 0.6 mg/L FEU — ABNORMAL HIGH (ref 0.00–0.49)

## 2017-07-02 ENCOUNTER — Other Ambulatory Visit: Payer: Self-pay | Admitting: Family Medicine

## 2017-07-02 ENCOUNTER — Ambulatory Visit (HOSPITAL_COMMUNITY)
Admission: RE | Admit: 2017-07-02 | Discharge: 2017-07-02 | Disposition: A | Discharge status: Home / Self Care | Payer: BLUE CROSS/BLUE SHIELD | Source: Ambulatory Visit | Attending: Family Medicine | Admitting: Family Medicine

## 2017-07-02 ENCOUNTER — Telehealth: Payer: Self-pay | Admitting: Family

## 2017-07-02 DIAGNOSIS — R0602 Shortness of breath: Secondary | ICD-10-CM | POA: Diagnosis not present

## 2017-07-02 DIAGNOSIS — R918 Other nonspecific abnormal finding of lung field: Secondary | ICD-10-CM | POA: Insufficient documentation

## 2017-07-02 DIAGNOSIS — R945 Abnormal results of liver function studies: Principal | ICD-10-CM

## 2017-07-02 DIAGNOSIS — K760 Fatty (change of) liver, not elsewhere classified: Secondary | ICD-10-CM | POA: Insufficient documentation

## 2017-07-02 DIAGNOSIS — R7989 Other specified abnormal findings of blood chemistry: Secondary | ICD-10-CM

## 2017-07-02 MED ORDER — IOPAMIDOL (ISOVUE-370) INJECTION 76%
100.0000 mL | Freq: Once | INTRAVENOUS | Status: AC | PRN
  Administered 2017-07-02: 100 mL via INTRAVENOUS

## 2017-07-02 NOTE — Telephone Encounter (Signed)
We already spoke this am.  She has had the CT done already.  Results reviewed with patient.

## 2017-07-02 NOTE — Telephone Encounter (Signed)
Please review and advise on patient's question.

## 2017-07-03 ENCOUNTER — Encounter (HOSPITAL_COMMUNITY): Payer: Self-pay

## 2017-07-03 ENCOUNTER — Observation Stay (HOSPITAL_COMMUNITY)
Admission: EM | Admit: 2017-07-03 | Discharge: 2017-07-04 | Disposition: A | Discharge status: Home / Self Care | Payer: BLUE CROSS/BLUE SHIELD | Attending: Family Medicine | Admitting: Family Medicine

## 2017-07-03 ENCOUNTER — Telehealth: Payer: Self-pay | Admitting: Family Medicine

## 2017-07-03 ENCOUNTER — Other Ambulatory Visit: Payer: Self-pay

## 2017-07-03 ENCOUNTER — Emergency Department (HOSPITAL_COMMUNITY): Payer: BLUE CROSS/BLUE SHIELD

## 2017-07-03 DIAGNOSIS — I10 Essential (primary) hypertension: Secondary | ICD-10-CM | POA: Diagnosis present

## 2017-07-03 DIAGNOSIS — I773 Arterial fibromuscular dysplasia: Secondary | ICD-10-CM | POA: Insufficient documentation

## 2017-07-03 DIAGNOSIS — R0609 Other forms of dyspnea: Secondary | ICD-10-CM | POA: Diagnosis not present

## 2017-07-03 DIAGNOSIS — Z79899 Other long term (current) drug therapy: Secondary | ICD-10-CM | POA: Insufficient documentation

## 2017-07-03 DIAGNOSIS — K219 Gastro-esophageal reflux disease without esophagitis: Secondary | ICD-10-CM | POA: Diagnosis not present

## 2017-07-03 DIAGNOSIS — R079 Chest pain, unspecified: Principal | ICD-10-CM | POA: Diagnosis present

## 2017-07-03 DIAGNOSIS — Z8249 Family history of ischemic heart disease and other diseases of the circulatory system: Secondary | ICD-10-CM

## 2017-07-03 DIAGNOSIS — R9431 Abnormal electrocardiogram [ECG] [EKG]: Secondary | ICD-10-CM | POA: Diagnosis not present

## 2017-07-03 DIAGNOSIS — R0602 Shortness of breath: Secondary | ICD-10-CM | POA: Diagnosis not present

## 2017-07-03 DIAGNOSIS — R0789 Other chest pain: Secondary | ICD-10-CM

## 2017-07-03 DIAGNOSIS — N950 Postmenopausal bleeding: Secondary | ICD-10-CM | POA: Diagnosis not present

## 2017-07-03 DIAGNOSIS — Z720 Tobacco use: Secondary | ICD-10-CM | POA: Diagnosis not present

## 2017-07-03 DIAGNOSIS — R42 Dizziness and giddiness: Secondary | ICD-10-CM | POA: Diagnosis not present

## 2017-07-03 DIAGNOSIS — M546 Pain in thoracic spine: Secondary | ICD-10-CM | POA: Diagnosis not present

## 2017-07-03 DIAGNOSIS — F17210 Nicotine dependence, cigarettes, uncomplicated: Secondary | ICD-10-CM | POA: Diagnosis not present

## 2017-07-03 DIAGNOSIS — R072 Precordial pain: Secondary | ICD-10-CM | POA: Diagnosis not present

## 2017-07-03 LAB — CBC
HEMATOCRIT: 44.4 % (ref 36.0–46.0)
HEMOGLOBIN: 14.9 g/dL (ref 12.0–15.0)
MCH: 30 pg (ref 26.0–34.0)
MCHC: 33.6 g/dL (ref 30.0–36.0)
MCV: 89.3 fL (ref 78.0–100.0)
Platelets: 287 10*3/uL (ref 150–400)
RBC: 4.97 MIL/uL (ref 3.87–5.11)
RDW: 13.2 % (ref 11.5–15.5)
WBC: 8 10*3/uL (ref 4.0–10.5)

## 2017-07-03 LAB — I-STAT BETA HCG BLOOD, ED (MC, WL, AP ONLY): I-stat hCG, quantitative: 12.3 m[IU]/mL — ABNORMAL HIGH (ref <5)

## 2017-07-03 LAB — CBG MONITORING, ED: Glucose-Capillary: 112 mg/dL — ABNORMAL HIGH (ref 65–99)

## 2017-07-03 LAB — BASIC METABOLIC PANEL
Anion gap: 12 (ref 5–15)
BUN: 10 mg/dL (ref 6–20)
CO2: 27 mmol/L (ref 22–32)
CREATININE: 0.78 mg/dL (ref 0.44–1.00)
Calcium: 9.2 mg/dL (ref 8.9–10.3)
Chloride: 97 mmol/L — ABNORMAL LOW (ref 101–111)
GFR calc Af Amer: >60 mL/min (ref >60)
GFR calc non Af Amer: >60 mL/min (ref >60)
GLUCOSE: 107 mg/dL — AB (ref 65–99)
Potassium: 3.6 mmol/L (ref 3.5–5.1)
Sodium: 136 mmol/L (ref 135–145)

## 2017-07-03 LAB — TROPONIN I

## 2017-07-03 LAB — I-STAT TROPONIN, ED
Troponin i, poc: 0 ng/mL (ref 0.00–0.08)
Troponin i, poc: 0 ng/mL (ref 0.00–0.08)

## 2017-07-03 MED ORDER — ACETAMINOPHEN 325 MG PO TABS: 650.0000 mg | ORAL_TABLET | ORAL | Status: DC | PRN

## 2017-07-03 MED ORDER — GI COCKTAIL ~~LOC~~: 30.0000 mL | Freq: Four times a day (QID) | ORAL | Status: DC | PRN

## 2017-07-03 MED ORDER — CARVEDILOL 3.125 MG PO TABS
6.2500 mg | ORAL_TABLET | Freq: Two times a day (BID) | ORAL | Status: DC
  Administered 2017-07-03 – 2017-07-04 (×2): 6.25 mg via ORAL
  Filled 2017-07-03 (×2): qty 2

## 2017-07-03 MED ORDER — GABAPENTIN 400 MG PO CAPS
800.0000 mg | ORAL_CAPSULE | Freq: Three times a day (TID) | ORAL | Status: DC
  Administered 2017-07-03 – 2017-07-04 (×2): 800 mg via ORAL
  Filled 2017-07-03 (×2): qty 2

## 2017-07-03 MED ORDER — OMEPRAZOLE MAGNESIUM 20 MG PO TBEC: 20.0000 mg | DELAYED_RELEASE_TABLET | Freq: Every day | ORAL | Status: DC

## 2017-07-03 MED ORDER — ASPIRIN 81 MG PO CHEW
324.0000 mg | CHEWABLE_TABLET | Freq: Once | ORAL | Status: AC
  Administered 2017-07-03: 324 mg via ORAL
  Filled 2017-07-03: qty 4

## 2017-07-03 MED ORDER — BENAZEPRIL HCL 10 MG PO TABS: 40.0000 mg | ORAL_TABLET | Freq: Every day | ORAL | Status: DC

## 2017-07-03 MED ORDER — NITROGLYCERIN 0.4 MG SL SUBL
0.4000 mg | SUBLINGUAL_TABLET | Freq: Once | SUBLINGUAL | Status: AC
Start: 2017-07-03 — End: 2017-07-03
  Administered 2017-07-03: 0.4 mg via SUBLINGUAL

## 2017-07-03 MED ORDER — BENAZEPRIL HCL 10 MG PO TABS
40.0000 mg | ORAL_TABLET | Freq: Every day | ORAL | Status: DC
  Administered 2017-07-03: 40 mg via ORAL
  Filled 2017-07-03: qty 4

## 2017-07-03 MED ORDER — CITALOPRAM HYDROBROMIDE 20 MG PO TABS
20.0000 mg | ORAL_TABLET | Freq: Every day | ORAL | Status: DC
  Administered 2017-07-04: 20 mg via ORAL
  Filled 2017-07-03: qty 1

## 2017-07-03 MED ORDER — ONDANSETRON HCL 4 MG/2ML IJ SOLN: 4.0000 mg | Freq: Four times a day (QID) | INTRAMUSCULAR | Status: DC | PRN

## 2017-07-03 MED ORDER — PANTOPRAZOLE SODIUM 40 MG PO TBEC
40.0000 mg | DELAYED_RELEASE_TABLET | Freq: Every day | ORAL | Status: DC
  Administered 2017-07-04: 40 mg via ORAL
  Filled 2017-07-03: qty 1

## 2017-07-03 MED ORDER — ENOXAPARIN SODIUM 40 MG/0.4ML ~~LOC~~ SOLN
40.0000 mg | SUBCUTANEOUS | Status: DC
  Administered 2017-07-03: 40 mg via SUBCUTANEOUS
  Filled 2017-07-03: qty 0.4

## 2017-07-03 MED ORDER — NITROGLYCERIN 0.4 MG SL SUBL: 0.4000 mg | SUBLINGUAL_TABLET | SUBLINGUAL | Status: DC | PRN

## 2017-07-03 MED ORDER — HYDROCHLOROTHIAZIDE 25 MG PO TABS
25.0000 mg | ORAL_TABLET | Freq: Every day | ORAL | Status: DC
  Administered 2017-07-04: 25 mg via ORAL
  Filled 2017-07-03: qty 1

## 2017-07-03 MED ORDER — MORPHINE SULFATE (PF) 4 MG/ML IV SOLN
4.0000 mg | Freq: Once | INTRAVENOUS | Status: AC
  Administered 2017-07-03: 4 mg via INTRAVENOUS
  Filled 2017-07-03: qty 1

## 2017-07-03 MED ORDER — GABAPENTIN 800 MG PO TABS
800.0000 mg | ORAL_TABLET | Freq: Three times a day (TID) | ORAL | Status: DC
  Filled 2017-07-03 (×2): qty 1

## 2017-07-03 MED ORDER — NITROGLYCERIN 0.4 MG SL SUBL
SUBLINGUAL_TABLET | SUBLINGUAL | Status: AC
  Filled 2017-07-03: qty 1

## 2017-07-03 NOTE — ED Notes (Signed)
Report given to Bonnie, RN.

## 2017-07-03 NOTE — ED Provider Notes (Signed)
[facility] EMERGENCY DEPARTMENT Provider Note   CSN: [id number] Arrival date & time: 07/03/17  0908     History   Chief Complaint Chief Complaint  Patient presents with  . Chest Pain    x 2 wks    HPI Jamie Mcgee is a 57 y.o. female with a history of tobacco use, hypertension, and obesity who presents to the emergency department for intermittent chest pain for the past 2 weeks that worsened this morning.  Patient states that she has been having 3-4 episodes of chest pain daily each lasting for about 3-4 minutes and then resolving.  Chest pain seems to be triggered with movement, not necessarily exertion, but can be at times. This morning at 8:00 she states that she had onset of chest pain.  States that it is left-sided and is described as a tightness/heaviness, also had somewhat of a burning component, rates the pain a 4 out of 10 in severity.  States the pain does not necessarily radiate anywhere, however she is also having pain to her mid upper back, is unsure if this is the same pain that is in her chest, this has been ongoing for 3 weeks as well, not necessarily with chest pain, worse with movement. She experienced some associated lightheaded this AM. She also reports chronic unchanged dyspnea on exertion. Denies nausea, vomiting, or diaphoresis.   She was seen by her primary care provider 4 days prior for sxs above, she was found to have an abnormal EKG and was therefore sent to her previous cardiologist Dr. Bronson Ing. Was seen in office same day and planned for stress test 02/04. PCP ordered labs as well as d-dimer- d-dimer was positive therefore patient had CTA yesterday - negative for pulmonary embolism, there were findings of mild bronchiolitis.   Pertinent risk factors include tobacco abuse, hypertension, obesity, and a family history of premature coronary disease.    HPI  Past Medical History:  Diagnosis Date  . Depression    hx of  . Fibromuscular dysplasia of renal  artery ([place]) 2006  . GERD (gastroesophageal reflux disease)   . HTN (hypertension)   . Neuropathy     Patient Active Problem List   Diagnosis Date Noted  . Osteopenia 11/22/2016  . Pre-diabetes 12/25/2015  . Obesity (BMI 30-39.9) 12/25/2015  . Cramp of both lower extremities 07/02/2015  . GERD (gastroesophageal reflux disease) 05/07/2015  . Vitamin D deficiency 05/07/2015  . Metabolic syndrome 69/62/9528  . Post-menopausal bleeding 05/07/2015  . Tobacco abuse 03/08/2015  . Atypical chest pain 03/08/2015  . Depressed 12/19/2013  . Essential hypertension, benign 12/19/2013    Past Surgical History:  Procedure Laterality Date  . APPENDECTOMY  1993  . CATARACT EXTRACTION  08-2010   left   . CATARACT EXTRACTION W/PHACO  06/01/2011   Procedure: CATARACT EXTRACTION PHACO AND INTRAOCULAR LENS PLACEMENT (IOC);  Surgeon: Tonny Branch;  Location: [facility];  Service: Ophthalmology;  Laterality: Right;  CDE:9.62  . CHOLECYSTECTOMY    . RENAL ARTERY ANGIOPLASTY  2006   right  . TUBAL LIGATION  1984   [facility]    OB History    No data available       Home Medications    Prior to Admission medications   Medication Sig Start Date End Date Taking? Authorizing Provider  benazepril (LOTENSIN) 40 MG tablet TAKE 1 TABLET BY MOUTH ONCE DAILY 06/25/17   Evelina Dun A, FNP  carvedilol (COREG) 6.25 MG tablet TAKE 1 TABLET BY MOUTH TWICE DAILY  WITH  A  MEAL 06/11/17   Evelina Dun A, FNP  citalopram (CELEXA) 20 MG tablet TAKE ONE TABLET BY MOUTH ONCE DAILY 01/22/17   Evelina Dun A, FNP  gabapentin (NEURONTIN) 800 MG tablet TAKE 1 TABLET BY MOUTH THREE TIMES DAILY 05/07/17   Evelina Dun A, FNP  hydrochlorothiazide (HYDRODIURIL) 25 MG tablet TAKE 1 TABLET BY MOUTH ONCE DAILY 06/25/17   Evelina Dun A, FNP  meloxicam (MOBIC) 15 MG tablet TAKE 1 TABLET BY MOUTH ONCE DAILY 06/25/17   Evelina Dun A, FNP  nitroGLYCERIN (NITROSTAT) 0.4 MG SL tablet Place 1 tablet (0.4 mg total) under the tongue  every 5 (five) minutes as needed. 06/15/16   Dettinger, Fransisca Kaufmann, MD  omeprazole (PRILOSEC OTC) 20 MG tablet Take 20 mg by mouth daily.    [provider]    Family History Family History  Problem Relation Age of Onset  . Coronary artery disease Father        States congenital abnormality  . Diabetes Father   . Hyperlipidemia Father   . CAD Sister        Stents   . Cancer Sister        breast  . Cancer Mother        breast  . Cancer Maternal Aunt        breast  . Cancer Maternal Grandmother        breast  . Anesthesia problems Neg Hx   . Hypotension Neg Hx   . Malignant hyperthermia Neg Hx   . Pseudochol deficiency Neg Hx     Social History Social History   Tobacco Use  . Smoking status: Current Every Day Smoker    Packs/day: 0.50    Years: 30.00    Pack years: 15.00    Types: Cigarettes  . Smokeless tobacco: Never Used  . Tobacco comment: pt quit smoking 2016 for 7 months but restarted last year  Substance Use Topics  . Alcohol use: No    Alcohol/week: 0.0 oz  . Drug use: No     Allergies   Lipitor [atorvastatin] and Naproxen   Review of Systems Review of Systems  Constitutional: Negative for chills, diaphoresis and fever.  Respiratory: Positive for shortness of breath (chronic DOE).   Cardiovascular: Positive for chest pain. Negative for palpitations and leg swelling.  Gastrointestinal: Negative for abdominal pain, nausea and vomiting.  Genitourinary: Negative for hematuria.  Musculoskeletal: Positive for back pain.  Neurological: Positive for light-headedness. Negative for syncope, weakness and numbness.       Negative for incontinence to bowel or bladder  All other systems reviewed and are negative.   Physical Exam Updated Vital Signs Pulse 73   Temp 97.8 F (36.6 C) (Oral)   Resp 18   Ht 5\' 4"  (1.626 m)   Wt 92.1 kg (203 lb)   LMP 05/25/2011   SpO2 96%   BMI 34.84 kg/m   Physical Exam  Constitutional: She appears well-developed  and well-nourished. No distress.  HENT:  Head: Normocephalic and atraumatic.  Eyes: Conjunctivae are normal. Right eye exhibits no discharge. Left eye exhibits no discharge.  Cardiovascular: Normal rate and regular rhythm.  No murmur heard. Pulses:      Radial pulses are 2+ on the right side, and 2+ on the left side.       Dorsalis pedis pulses are 2+ on the right side, and 2+ on the left side.  Pulmonary/Chest: Effort normal and breath sounds normal. No respiratory distress.  She has no decreased breath sounds. She has no wheezes. She has no rales. She exhibits tenderness (Well localized left chest wall tenderness, no crepitus, no swelling, no overlying obvious deformity.).  Abdominal: Soft. She exhibits no distension. There is no tenderness.  Musculoskeletal: She exhibits no edema.  Back: Diffuse upper back tenderness to palpation including diffuse midline thoracic tenderness extending to bilateral thoracic paraspinal muscles, there is no point focal tenderness.  Neurological: She is alert.  Clear speech.  Gait intact.  5 out of 5 grip strength bilaterally. Sensation grossly intact bilateral upper and lower extremities.  Skin: Skin is warm and dry. No rash noted.  Psychiatric: She has a normal mood and affect. Her behavior is normal.  Nursing note and vitals reviewed.   ED Treatments / Results  Labs Results for orders placed or performed during the hospital encounter of 15/40/08  Basic metabolic panel  Result Value Ref Range   Sodium 136 135 - 145 mmol/L   Potassium 3.6 3.5 - 5.1 mmol/L   Chloride 97 (L) 101 - 111 mmol/L   CO2 27 22 - 32 mmol/L   Glucose, Bld 107 (H) 65 - 99 mg/dL   BUN 10 6 - 20 mg/dL   Creatinine, Ser 0.78 0.44 - 1.00 mg/dL   Calcium 9.2 8.9 - 10.3 mg/dL   GFR calc non Af Amer >60 >60 mL/min   GFR calc Af Amer >60 >60 mL/min   Anion gap 12 5 - 15  CBC  Result Value Ref Range   WBC 8.0 4.0 - 10.5 K/uL   RBC 4.97 3.87 - 5.11 MIL/uL   Hemoglobin 14.9 12.0 -  15.0 g/dL   HCT 44.4 36.0 - 46.0 %   MCV 89.3 78.0 - 100.0 fL   MCH 30.0 26.0 - 34.0 pg   MCHC 33.6 30.0 - 36.0 g/dL   RDW 13.2 11.5 - 15.5 %   Platelets 287 150 - 400 K/uL  CBG monitoring, ED  Result Value Ref Range   Glucose-Capillary 112 (H) 65 - 99 mg/dL  I-stat troponin, ED (0, 3)  Result Value Ref Range   Troponin i, poc 0.00 0.00 - 0.08 ng/mL   Comment 3          I-Stat beta hCG blood, ED  Result Value Ref Range   I-stat hCG, quantitative 12.3 (H) <5 mIU/mL   Comment 3          I-stat troponin, ED (0, 3)  Result Value Ref Range   Troponin i, poc 0.00 0.00 - 0.08 ng/mL   Comment 3            EKG Interpretation  Date/Time:  Tuesday July 03 2017 09:19:37 EST Ventricular Rate:  74 PR Interval:    QRS Duration: 77 QT Interval:  504 QTC Calculation: 560 R Axis:   93 Text Interpretation:  Sinus rhythm Borderline short PR interval Borderline right axis deviation Anteroseptal infarct, old Borderline repolarization abnormality Prolonged QT interval Similar to prior. No STEMI.  Confirmed by Nanda Quinton [phone]) on 07/03/2017 9:38:52 AM  Radiology Ct Angio Chest Pe W Or Wo Contrast  Result Date: 07/02/2017 CLINICAL DATA:  Shortness of breath for 2-3 weeks. Elevated D-dimer. EXAM: CT ANGIOGRAPHY CHEST WITH CONTRAST TECHNIQUE: Multidetector CT imaging of the chest was performed using the standard protocol during bolus administration of intravenous contrast. Multiplanar CT image reconstructions and MIPs were obtained to evaluate the vascular anatomy. CONTRAST:  116mL ISOVUE-370 IOPAMIDOL (ISOVUE-370) INJECTION 76% COMPARISON:  None FINDINGS: Cardiovascular:  Satisfactory opacification of the pulmonary arteries to the segmental level. No evidence of pulmonary embolism. Normal heart size. No pericardial effusion. No lobar or segmental pulmonary artery filling defects identified. Mediastinum/Nodes: No enlarged mediastinal, hilar, or axillary lymph nodes. Thyroid gland, trachea, and  esophagus demonstrate no significant findings. Lungs/Pleura: No pleural effusion identified. No airspace opacities identified. Scar versus subsegmental atelectasis within the right middle lobe, image 99 of series 6. Mild changes of emphysema. Mild diffuse bronchial wall thickening is identified. There are a few scratch set there is a cluster scattered small tree-in-bud nodules within the left lung base, image 108 of series 6. Upper Abdomen: Marked diffuse hepatic steatosis. No acute abnormality. Musculoskeletal: There is mild spondylosis identified within the thoracic spine. No aggressive lytic or sclerotic bone lesions. Review of the MIP images confirms the above findings. IMPRESSION: 1. No evidence for acute pulmonary embolus. 2. Small cluster of tree-in-bud nodules within the left lower lobe compatible with mild inflammatory or infectious bronchiolitis. 3. Hepatic steatosis. Electronically Signed   By: Kerby Moors M.D.   On: 07/02/2017 13:57   Dg Chest Portable 1 View  Result Date: 07/03/2017 CLINICAL DATA:  57 year old female with a history of chest pain EXAM: PORTABLE CHEST 1 VIEW COMPARISON:  06/29/2017, 06/11/2017, CT chest 07/02/2017 FINDINGS: Cardiomediastinal silhouette unchanged in size and contour. No evidence of central vascular congestion. Coarsened interstitial markings. No confluent airspace disease. Questionable nodular opacities at the bilateral lung bases. No displaced fracture. IMPRESSION: No evidence of lobar pneumonia, however, there is questionable pattern of nodular opacity at the bilateral lung bases, which may correlate to the tree-in-bud opacity on the recent chest CT. Electronically Signed   By: Corrie Mckusick D.O.   On: 07/03/2017 10:26    Procedures Procedures (including critical care time)  Medications Ordered in ED Medications - No data to display   Initial Impression / Assessment and Plan / ED Course  I have reviewed the triage vital signs and the nursing  notes.  Pertinent labs & imaging results that were available during my care of the patient were reviewed by me and considered in my medical decision making (see chart for details).  Patient presents with intermittent chest pain for the past 2 weeks that worsened and became more constant this morning.  Patient is nontoxic-appearing, in no apparent distress, vitals are within normal limits, blood pressure intermittently somewhat elevated. Will evaluate with EKG, CXR, troponin, and screening labs. Will treat with Aspirin and Nitroglycerin.   Screening lab work fairly unremarkable, notable for no anemia, leukocytosis, or significant electrolyte abnormality. Initial troponin WNL. CXR somewhat consistent with patient's previous CTA findings regarding mild bronchiolitis. EKG is similar to previous. Patient with CTA yesterday negative for pulmonary embolism, she has had intermittent pain for past 2-3 weeks that is similar in location and quality- longer in duration and more severe today, no signs/sxs of DVT, hemoptysis, recent surgery/trauma, recent long travel, hormone use, personal hx of cancer, or hx of DVT/PE, doubt pulmonary embolism. Chest pain is not a tearing sensation, pulses are 2+ and symmetric, there is no mediastinum widening on CXR, doubt dissection. Patient's initial troponin negative, EKG without significant change from previous, Heart Score of 3. Given patient is planned for stress test next week and had worsening of her pain will plan to admit for chest pain rule out and cardiology consultation for possible stress test.   12:00: RE-EVAL: Patient's chest pain has resolved following nitroglycerin, states she is having persistent diffuse upper back pain. Will treat  with morphine.    12:30: CONSULT: Discussed case with Dr. Domenic Polite, cardiology, agrees to consult.   13:21: CONSULT: Discussed case with Dr. Jerilee Hoh, hospitalist, who accepts admission.   Findings and plan of care discussed with  supervising physician Dr. Laverta Baltimore who is in agreement with plan.    Final Clinical Impressions(s) / ED Diagnoses   Final diagnoses:  Chest pain, unspecified type    ED Discharge Orders    None       Amaryllis Dyke, PA-C 07/03/17 1642    Margette Fast, MD 07/03/17 1946

## 2017-07-03 NOTE — ED Triage Notes (Signed)
CP x 2 weeks - intermittent.  Worsening today with radiation to left arm.  Also c/o [facility], denies N/V.

## 2017-07-03 NOTE — Telephone Encounter (Signed)
[email or web address]

## 2017-07-03 NOTE — Consult Note (Signed)
Cardiology Consultation:   Patient ID: Jamie Mcgee; [id number]; 08-14-1960   Admit date: 07/03/2017 Date of Consult: 07/03/2017  Primary Care Provider: Sharion Balloon, FNP Primary Cardiologist: Dr. Kate Sable   Patient Profile:   Jamie Mcgee is a 57 y.o. female with a history of hypertension and renal artery fibromuscular dysplasia who is being seen today for the evaluation of chest pain at the request of Dr. Jerilee Hoh.  History of Present Illness:   [name] presents to the ER complaining of worsening chest discomfort. She was seen recently by Dr. Bronson Ing on January 25 for evaluation of exertional dyspnea and recurring chest tightness. Actually, she was already scheduled to undergo a Lexiscan Myoview next week as an outpatient.  She states that she was walking across a store when she began to experience tightness in her chest, radiating to the shoulder blades. This was more intense and prolonged than usual prompting her ER visit. ECG remains chronically abnormal with fairly diffuse ST-T wave abnormalities. Troponin I levels are negative at this point.  She did have a recent chest CTA that showed no evidence of pulmonary embolus with small cluster of tree-in-bud nodules within the left lower lobe suggesting of a mild inflammatory process or bronchiolitis. No fevers or chills, no recent cough.  Past Medical History:  Diagnosis Date  . Depression   . Fibromuscular dysplasia of renal artery ([place]) 2006  . GERD (gastroesophageal reflux disease)   . HTN (hypertension)   . Neuropathy     Past Surgical History:  Procedure Laterality Date  . APPENDECTOMY  1993  . CATARACT EXTRACTION  08-2010   left   . CATARACT EXTRACTION W/PHACO  06/01/2011   Procedure: CATARACT EXTRACTION PHACO AND INTRAOCULAR LENS PLACEMENT (IOC);  Surgeon: Tonny Branch;  Location: [facility];  Service: Ophthalmology;  Laterality: Right;  CDE:9.62  . CHOLECYSTECTOMY    . RENAL ARTERY ANGIOPLASTY  2006     right  . TUBAL LIGATION  1984   [facility]     Outpatient Medications: No current facility-administered medications on file prior to encounter.    Current Outpatient Medications on File Prior to Encounter  Medication Sig Dispense Refill  . benazepril (LOTENSIN) 40 MG tablet TAKE 1 TABLET BY MOUTH ONCE DAILY 90 tablet 0  . carvedilol (COREG) 6.25 MG tablet TAKE 1 TABLET BY MOUTH TWICE DAILY WITH  A  MEAL 180 tablet 0  . citalopram (CELEXA) 20 MG tablet TAKE ONE TABLET BY MOUTH ONCE DAILY 90 tablet 1  . gabapentin (NEURONTIN) 800 MG tablet TAKE 1 TABLET BY MOUTH THREE TIMES DAILY 90 tablet 2  . hydrochlorothiazide (HYDRODIURIL) 25 MG tablet TAKE 1 TABLET BY MOUTH ONCE DAILY 90 tablet 0  . meloxicam (MOBIC) 15 MG tablet TAKE 1 TABLET BY MOUTH ONCE DAILY 30 tablet 0  . nitroGLYCERIN (NITROSTAT) 0.4 MG SL tablet Place 1 tablet (0.4 mg total) under the tongue every 5 (five) minutes as needed. 25 tablet 3  . omeprazole (PRILOSEC OTC) 20 MG tablet Take 20 mg by mouth daily.      Allergies:    Allergies  Allergen Reactions  . Lipitor [Atorvastatin] Other (See Comments)    Muscle aches and nausea  . Naproxen     Stomach pain    Social History:   Social History   Socioeconomic History  . Marital status: Divorced    Spouse name: Not on file  . Number of children: Not on file  . Years of education: Not on file  .  Highest education level: Not on file  Social Needs  . Financial resource strain: Not on file  . Food insecurity - worry: Not on file  . Food insecurity - inability: Not on file  . Transportation needs - medical: Not on file  . Transportation needs - non-medical: Not on file  Occupational History  . Not on file  Tobacco Use  . Smoking status: Current Every Day Smoker    Packs/day: 0.50    Years: 30.00    Pack years: 15.00    Types: Cigarettes  . Smokeless tobacco: Never Used  . Tobacco comment: pt quit smoking 2016 for 7 months but restarted last year  Substance and  Sexual Activity  . Alcohol use: No    Alcohol/week: 0.0 oz  . Drug use: No  . Sexual activity: Yes    Birth control/protection: None, Post-menopausal  Other Topics Concern  . Not on file  Social History Narrative   The patient does not have any sex drive and has not had one for almost a year.   Her   husband thinks she does not love him anymore.  He does not seem to understand   her   depression.    Family History:   The patient's family history includes CAD in her sister; Cancer in her maternal aunt, maternal grandmother, mother, and sister; Coronary artery disease in her father; Diabetes in her father; Hyperlipidemia in her father. There is no history of Anesthesia problems, Hypotension, Malignant hyperthermia, or Pseudochol deficiency.  ROS:  Please see the history of present illness.  Fatigue. All other ROS reviewed and negative.     Physical Exam/Data:   Vitals:   07/03/17 1216 07/03/17 1230 07/03/17 1421 07/03/17 1445  BP: 122/75 115/70 (!) 130/94 101/70  Pulse: 80 76 81 74  Resp: 18 17 18 12   Temp:      TempSrc:      SpO2: 95% 93% 92% 93%  Weight:      Height:       No intake or output data in the 24 hours ending 07/03/17 1511 Filed Weights   07/03/17 0916  Weight: 203 lb (92.1 kg)   Body mass index is 34.84 kg/m.   Gen: Patient appears comfortable at rest. HEENT: Conjunctiva and lids normal, oropharynx clear. Neck: Supple, no elevated JVP or carotid bruits, no thyromegaly. Lungs: Clear to auscultation, nonlabored breathing at rest. Cardiac: Regular rate and rhythm, no S3 or significant systolic murmur, no pericardial rub. Abdomen: Soft, nontender, bowel sounds present, no guarding or rebound. Extremities: No pitting edema, distal pulses 2+. Skin: Warm and dry. Musculoskeletal: No kyphosis. Neuropsychiatric: Alert and oriented x3, affect grossly appropriate.  EKG:  I personally reviewed the tracing from 07/03/2017 which shows sinus rhythm with short PR  interval, decreased R wave progression in the anteroseptal leads with diffuse nonspecific ST-T wave abnormalities.  Telemetry:  I personally reviewed telemetry which shows sinus rhythm.  Relevant CV Studies:  Lexiscan Myoview 03/09/2015:  The study is normal.  This is a low risk study.  Nuclear stress EF: 51%.  Significant inferior wall soft tissue attenuation artifact noted.  Echocardiogram 03/09/2015: Study Conclusions  - Left ventricle: The cavity size was normal. Wall thickness was   increased in a pattern of mild LVH. Systolic function was normal.   The estimated ejection fraction was in the range of 60% to 65%.   Wall motion was normal; there were no regional wall motion   abnormalities. Indeterminate diastolic function.  Laboratory  Data:  Chemistry Recent Labs  Lab 06/29/17 1141 07/03/17 0928  NA 139 136  K 4.4 3.6  CL 100 97*  CO2 23 27  GLUCOSE 97 107*  BUN 8 10  CREATININE 0.88 0.78  CALCIUM 9.2 9.2  GFRNONAA 73 >60  GFRAA 84 >60  ANIONGAP  --  12    Recent Labs  Lab 06/29/17 1141  PROT 6.9  ALBUMIN 3.8  AST 53*  ALT 43*  ALKPHOS 94  BILITOT 0.3   Hematology Recent Labs  Lab 06/29/17 1141 07/03/17 0928  WBC 7.1 8.0  RBC 4.93 4.97  HGB 14.9 14.9  HCT 44.4 44.4  MCV 90 89.3  MCH 30.2 30.0  MCHC 33.6 33.6  RDW 13.8 13.2  PLT 330 287   Cardiac EnzymesNo results for input(s): TROPONINI in the last 168 hours.  Recent Labs  Lab 07/03/17 1008 07/03/17 1309  TROPIPOC 0.00 0.00    BNP Recent Labs  Lab 06/29/17 1141  BNP 45.6    DDimer  Recent Labs  Lab 06/29/17 1141  DDIMER 0.60*    Radiology/Studies:  Ct Angio Chest Pe W Or Wo Contrast  Result Date: 07/02/2017 CLINICAL DATA:  Shortness of breath for 2-3 weeks. Elevated D-dimer. EXAM: CT ANGIOGRAPHY CHEST WITH CONTRAST TECHNIQUE: Multidetector CT imaging of the chest was performed using the standard protocol during bolus administration of intravenous contrast. Multiplanar CT  image reconstructions and MIPs were obtained to evaluate the vascular anatomy. CONTRAST:  152mL ISOVUE-370 IOPAMIDOL (ISOVUE-370) INJECTION 76% COMPARISON:  None FINDINGS: Cardiovascular: Satisfactory opacification of the pulmonary arteries to the segmental level. No evidence of pulmonary embolism. Normal heart size. No pericardial effusion. No lobar or segmental pulmonary artery filling defects identified. Mediastinum/Nodes: No enlarged mediastinal, hilar, or axillary lymph nodes. Thyroid gland, trachea, and esophagus demonstrate no significant findings. Lungs/Pleura: No pleural effusion identified. No airspace opacities identified. Scar versus subsegmental atelectasis within the right middle lobe, image 99 of series 6. Mild changes of emphysema. Mild diffuse bronchial wall thickening is identified. There are a few scratch set there is a cluster scattered small tree-in-bud nodules within the left lung base, image 108 of series 6. Upper Abdomen: Marked diffuse hepatic steatosis. No acute abnormality. Musculoskeletal: There is mild spondylosis identified within the thoracic spine. No aggressive lytic or sclerotic bone lesions. Review of the MIP images confirms the above findings. IMPRESSION: 1. No evidence for acute pulmonary embolus. 2. Small cluster of tree-in-bud nodules within the left lower lobe compatible with mild inflammatory or infectious bronchiolitis. 3. Hepatic steatosis. Electronically Signed   By: Kerby Moors M.D.   On: 07/02/2017 13:57   Dg Chest Portable 1 View  Result Date: 07/03/2017 CLINICAL DATA:  57 year old female with a history of chest pain EXAM: PORTABLE CHEST 1 VIEW COMPARISON:  06/29/2017, 06/11/2017, CT chest 07/02/2017 FINDINGS: Cardiomediastinal silhouette unchanged in size and contour. No evidence of central vascular congestion. Coarsened interstitial markings. No confluent airspace disease. Questionable nodular opacities at the bilateral lung bases. No displaced fracture.  IMPRESSION: No evidence of lobar pneumonia, however, there is questionable pattern of nodular opacity at the bilateral lung bases, which may correlate to the tree-in-bud opacity on the recent chest CT. Electronically Signed   By: Corrie Mckusick D.O.   On: 07/03/2017 10:26    Assessment and Plan:   1. Recurrent chest discomfort, also dyspnea on exertion. This has been present over the last few weeks, worse episode prompted ER evaluation today. Initial cardiac markers argue against ACS. Her ECG is  abnormal as before. Cardiac risk factors include tobacco abuse, hypertension, and family history of premature CAD. She has already been scheduled for an outpatient [place] by Dr. Bronson Ing.  2. Hypertension by history. Currently on Lotensin, Coreg, and HCTZ.  3. Renal artery fibromuscular dysplasia with history of angioplasty in 2006.  4. Recent abnormal chest CTA showing small cluster of tree-in-bud nodules within the left lower lobe. Uncertain if this is leading to any of her current symptoms or not.  Patient is being admitted for observation on the hospitalist team. Cycle cardiac markers and repeat ECG in the morning. We will try to get her [facility] scheduled for tomorrow morning.   Signed, Rozann Lesches, MD  07/03/2017 3:11 PM

## 2017-07-03 NOTE — ED Notes (Signed)
Pt nuclear med test tomorrow am. Pt to be NPO after midnight.

## 2017-07-03 NOTE — H&P (Signed)
History and Physical    Jamie Mcgee FXT:[id number] DOB: 06-Dec-1960 DOA: 07/03/2017  Referring MD/NP/PA: Kennith Maes, ED PA PCP: Sharion Balloon, FNP  Patient coming from: Home   Chief Complaint: chest pain  HPI: Jamie Mcgee is a 57 y.o. female with history of hypertension and renal artery dysplasia who came into the hospital today complaining of worsening chest pain.  She actually saw cardiology 5 days ago for evaluation of exertional dyspnea and an abnormal EKG and was scheduled to undergo a Myoview next week as an outpatient.  She states that she was walking in a store when she had tightness in her chest radiating to both shoulder blades, more intense and prolonged than usual she comes to the hospital for evaluation.  Troponin is negative, EKG shows diffuse ST-T wave changes.  Recent CT angios showed no evidence of PE.  She has already been seen by cardiology who was planning on performing stress test in a.m.  Admission requested.  Past Medical/Surgical History: Past Medical History:  Diagnosis Date  . Depression   . Fibromuscular dysplasia of renal artery ([place]) 2006  . GERD (gastroesophageal reflux disease)   . HTN (hypertension)   . Neuropathy     Past Surgical History:  Procedure Laterality Date  . APPENDECTOMY  1993  . CATARACT EXTRACTION  08-2010   left   . CATARACT EXTRACTION W/PHACO  06/01/2011   Procedure: CATARACT EXTRACTION PHACO AND INTRAOCULAR LENS PLACEMENT (IOC);  Surgeon: Tonny Branch;  Location: [facility];  Service: Ophthalmology;  Laterality: Right;  CDE:9.62  . CHOLECYSTECTOMY    . RENAL ARTERY ANGIOPLASTY  2006   right  . TUBAL LIGATION  1984   [facility]    Social History:  reports that she has been smoking cigarettes.  She has a 15.00 pack-year smoking history. she has never used smokeless tobacco. She reports that she does not drink alcohol or use drugs.  Allergies: Allergies  Allergen Reactions  . Lipitor [Atorvastatin] Other (See Comments)   Muscle aches and nausea  . Naproxen     Stomach pain    Family History:  Family History  Problem Relation Age of Onset  . Coronary artery disease Father        States congenital abnormality  . Diabetes Father   . Hyperlipidemia Father   . CAD Sister        Stents   . Cancer Sister        breast  . Cancer Mother        breast  . Cancer Maternal Aunt        breast  . Cancer Maternal Grandmother        breast  . Anesthesia problems Neg Hx   . Hypotension Neg Hx   . Malignant hyperthermia Neg Hx   . Pseudochol deficiency Neg Hx     Prior to Admission medications   Medication Sig Start Date End Date Taking? Authorizing Provider  benazepril (LOTENSIN) 40 MG tablet TAKE 1 TABLET BY MOUTH ONCE DAILY 06/25/17  Yes Hawks, Christy A, FNP  carvedilol (COREG) 6.25 MG tablet TAKE 1 TABLET BY MOUTH TWICE DAILY WITH  A  MEAL 06/11/17  Yes Hawks, Christy A, FNP  citalopram (CELEXA) 20 MG tablet TAKE ONE TABLET BY MOUTH ONCE DAILY 01/22/17  Yes Hawks, Christy A, FNP  gabapentin (NEURONTIN) 800 MG tablet TAKE 1 TABLET BY MOUTH THREE TIMES DAILY 05/07/17  Yes Hawks, Christy A, FNP  hydrochlorothiazide (HYDRODIURIL) 25 MG tablet TAKE 1 TABLET  BY MOUTH ONCE DAILY 06/25/17  Yes Evelina Dun A, FNP  meloxicam (MOBIC) 15 MG tablet TAKE 1 TABLET BY MOUTH ONCE DAILY 06/25/17  Yes Hawks, Christy A, FNP  nitroGLYCERIN (NITROSTAT) 0.4 MG SL tablet Place 1 tablet (0.4 mg total) under the tongue every 5 (five) minutes as needed. 06/15/16  Yes Dettinger, Fransisca Kaufmann, MD  omeprazole (PRILOSEC OTC) 20 MG tablet Take 20 mg by mouth daily.   Yes [provider]    Review of Systems:  Constitutional: Denies fever, chills, diaphoresis, appetite change and fatigue.  HEENT: Denies photophobia, eye pain, redness, hearing loss, ear pain, congestion, sore throat, rhinorrhea, sneezing, mouth sores, trouble swallowing, neck pain, neck stiffness and tinnitus.   Respiratory: Denies  cough, chest tightness,  and wheezing.    Cardiovascular: Denies  palpitations and leg swelling.  Gastrointestinal: Denies nausea, vomiting, abdominal pain, diarrhea, constipation, blood in stool and abdominal distention.  Genitourinary: Denies dysuria, urgency, frequency, hematuria, flank pain and difficulty urinating.  Endocrine: Denies: hot or cold intolerance, sweats, changes in hair or nails, polyuria, polydipsia. Musculoskeletal: Denies myalgias, back pain, joint swelling, arthralgias and gait problem.  Skin: Denies pallor, rash and wound.  Neurological: Denies dizziness, seizures, syncope, weakness, light-headedness, numbness and headaches.  Hematological: Denies adenopathy. Easy bruising, personal or family bleeding history  Psychiatric/Behavioral: Denies suicidal ideation, mood changes, confusion, nervousness, sleep disturbance and agitation    Physical Exam: Vitals:   07/03/17 1230 07/03/17 1421 07/03/17 1445 07/03/17 1608  BP: 115/70 (!) 130/94 101/70 120/72  Pulse: 76 81 74 70  Resp: 17 18 12 18   Temp:    98 F (36.7 C)  TempSrc:      SpO2: 93% 92% 93% 95%  Weight:      Height:         Constitutional: NAD, calm, comfortable Eyes: PERRL, lids and conjunctivae normal ENMT: Mucous membranes are moist. Posterior pharynx clear of any exudate or lesions.Normal dentition.  Neck: normal, supple, no masses, no thyromegaly Respiratory: clear to auscultation bilaterally, no wheezing, no crackles. Normal respiratory effort. No accessory muscle use.  Cardiovascular: Regular rate and rhythm, no murmurs / rubs / gallops. No extremity edema. 2+ pedal pulses. No carotid bruits.  Abdomen: no tenderness, no masses palpated. No hepatosplenomegaly. Bowel sounds positive.  Musculoskeletal: no clubbing / cyanosis. No joint deformity upper and lower extremities. Good ROM, no contractures. Normal muscle tone.  Skin: no rashes, lesions, ulcers. No induration Neurologic: CN 2-12 grossly intact. Sensation intact, DTR normal. Strength  5/5 in all 4.  Psychiatric: Normal judgment and insight. Alert and oriented x 3. Normal mood.    Labs on Admission: I have personally reviewed the following labs and imaging studies  CBC: Recent Labs  Lab 06/29/17 1141 07/03/17 0928  WBC 7.1 8.0  HGB 14.9 14.9  HCT 44.4 44.4  MCV 90 89.3  PLT 330 353   Basic Metabolic Panel: Recent Labs  Lab 06/29/17 1141 07/03/17 0928  NA 139 136  K 4.4 3.6  CL 100 97*  CO2 23 27  GLUCOSE 97 107*  BUN 8 10  CREATININE 0.88 0.78  CALCIUM 9.2 9.2   GFR: Estimated Creatinine Clearance: 85.4 mL/min (by C-G formula based on SCr of 0.78 mg/dL). Liver Function Tests: Recent Labs  Lab 06/29/17 1141  AST 53*  ALT 43*  ALKPHOS 94  BILITOT 0.3  PROT 6.9  ALBUMIN 3.8   No results for input(s): LIPASE, AMYLASE in the last 168 hours. No results for input(s): AMMONIA in  the last 168 hours. Coagulation Profile: No results for input(s): INR, PROTIME in the last 168 hours. Cardiac Enzymes: No results for input(s): CKTOTAL, CKMB, CKMBINDEX, TROPONINI in the last 168 hours. BNP (last 3 results) No results for input(s): PROBNP in the last 8760 hours. HbA1C: No results for input(s): HGBA1C in the last 72 hours. CBG: Recent Labs  Lab 07/03/17 1004  GLUCAP 112*   Lipid Profile: No results for input(s): CHOL, HDL, LDLCALC, TRIG, CHOLHDL, LDLDIRECT in the last 72 hours. Thyroid Function Tests: No results for input(s): TSH, T4TOTAL, FREET4, T3FREE, THYROIDAB in the last 72 hours. Anemia Panel: No results for input(s): VITAMINB12, FOLATE, FERRITIN, TIBC, IRON, RETICCTPCT in the last 72 hours. Urine analysis:    Component Value Date/Time   APPEARANCEUR Clear 05/04/2017 1049   GLUCOSEU Negative 05/04/2017 1049   BILIRUBINUR Negative 05/04/2017 1049   PROTEINUR Negative 05/04/2017 1049   UROBILINOGEN negative 03/31/2015 1738   NITRITE Negative 05/04/2017 1049   LEUKOCYTESUR Negative 05/04/2017 1049   Sepsis  Labs: @LABRCNTIP (procalcitonin:4,lacticidven:4) )No results found for this or any previous visit (from the past 240 hour(s)).   Radiological Exams on Admission: Ct Angio Chest Pe W Or Wo Contrast  Result Date: 07/02/2017 CLINICAL DATA:  Shortness of breath for 2-3 weeks. Elevated D-dimer. EXAM: CT ANGIOGRAPHY CHEST WITH CONTRAST TECHNIQUE: Multidetector CT imaging of the chest was performed using the standard protocol during bolus administration of intravenous contrast. Multiplanar CT image reconstructions and MIPs were obtained to evaluate the vascular anatomy. CONTRAST:  153mL ISOVUE-370 IOPAMIDOL (ISOVUE-370) INJECTION 76% COMPARISON:  None FINDINGS: Cardiovascular: Satisfactory opacification of the pulmonary arteries to the segmental level. No evidence of pulmonary embolism. Normal heart size. No pericardial effusion. No lobar or segmental pulmonary artery filling defects identified. Mediastinum/Nodes: No enlarged mediastinal, hilar, or axillary lymph nodes. Thyroid gland, trachea, and esophagus demonstrate no significant findings. Lungs/Pleura: No pleural effusion identified. No airspace opacities identified. Scar versus subsegmental atelectasis within the right middle lobe, image 99 of series 6. Mild changes of emphysema. Mild diffuse bronchial wall thickening is identified. There are a few scratch set there is a cluster scattered small tree-in-bud nodules within the left lung base, image 108 of series 6. Upper Abdomen: Marked diffuse hepatic steatosis. No acute abnormality. Musculoskeletal: There is mild spondylosis identified within the thoracic spine. No aggressive lytic or sclerotic bone lesions. Review of the MIP images confirms the above findings. IMPRESSION: 1. No evidence for acute pulmonary embolus. 2. Small cluster of tree-in-bud nodules within the left lower lobe compatible with mild inflammatory or infectious bronchiolitis. 3. Hepatic steatosis. Electronically Signed   By: Kerby Moors  M.D.   On: 07/02/2017 13:57   Dg Chest Portable 1 View  Result Date: 07/03/2017 CLINICAL DATA:  57 year old female with a history of chest pain EXAM: PORTABLE CHEST 1 VIEW COMPARISON:  06/29/2017, 06/11/2017, CT chest 07/02/2017 FINDINGS: Cardiomediastinal silhouette unchanged in size and contour. No evidence of central vascular congestion. Coarsened interstitial markings. No confluent airspace disease. Questionable nodular opacities at the bilateral lung bases. No displaced fracture. IMPRESSION: No evidence of lobar pneumonia, however, there is questionable pattern of nodular opacity at the bilateral lung bases, which may correlate to the tree-in-bud opacity on the recent chest CT. Electronically Signed   By: Corrie Mckusick D.O.   On: 07/03/2017 10:26    EKG: Independently reviewed.  Sinus rhythm, short PR interval, nonspecific diffuse ST-T wave changes  Assessment/Plan Principal Problem:   Chest pain Active Problems:   Essential hypertension, benign  Tobacco abuse   GERD (gastroesophageal reflux disease)    Chest pain -Admit to telemetry, cycle troponins, repeat EKG in a.m. -Request 2D echo. -Already seen by cardiology with plans for inpatient stress test in a.m.  Tobacco abuse -Counseled on cessation.  With benign essential hypertension -Continue home medications, follow and adjust as needed.  GERD Continue PPI.   DVT prophylaxis: Lovenox Code Status: Full code Family Communication: Patient only Disposition Plan: Anticipate discharge home in a.m. if stress test normal Consults called: Cardiology Admission status: Observation   Time Spent: 85 minutes  Estela Isaac Bliss MD [facility] Pager [phone]  If 7PM-7AM, please contact night-coverage [email or web address] Password [email or web address]  07/03/2017, 4:55 PM

## 2017-07-04 ENCOUNTER — Encounter (HOSPITAL_COMMUNITY): Payer: Self-pay

## 2017-07-04 ENCOUNTER — Observation Stay (HOSPITAL_BASED_OUTPATIENT_CLINIC_OR_DEPARTMENT_OTHER)
Admit: 2017-07-04 | Discharge: 2017-07-04 | Disposition: A | Discharge status: Home / Self Care | Payer: BLUE CROSS/BLUE SHIELD | Attending: Cardiovascular Disease | Admitting: Cardiovascular Disease

## 2017-07-04 DIAGNOSIS — R079 Chest pain, unspecified: Secondary | ICD-10-CM | POA: Diagnosis not present

## 2017-07-04 DIAGNOSIS — R072 Precordial pain: Secondary | ICD-10-CM | POA: Diagnosis not present

## 2017-07-04 DIAGNOSIS — K219 Gastro-esophageal reflux disease without esophagitis: Secondary | ICD-10-CM

## 2017-07-04 DIAGNOSIS — R0609 Other forms of dyspnea: Secondary | ICD-10-CM

## 2017-07-04 DIAGNOSIS — I10 Essential (primary) hypertension: Secondary | ICD-10-CM | POA: Diagnosis not present

## 2017-07-04 DIAGNOSIS — Z720 Tobacco use: Secondary | ICD-10-CM | POA: Diagnosis not present

## 2017-07-04 DIAGNOSIS — R0600 Dyspnea, unspecified: Secondary | ICD-10-CM

## 2017-07-04 LAB — NM MYOCAR MULTI W/SPECT W/WALL MOTION / EF
CHL CUP NUCLEAR SRS: 3
CHL CUP RESTING HR STRESS: 66 {beats}/min
LV dias vol: 51 mL (ref 46–106)
LV sys vol: 16 mL
NUC STRESS TID: 0.96
Peak HR: 100 {beats}/min
RATE: 0.38
SDS: 1
SSS: 4

## 2017-07-04 LAB — TROPONIN I

## 2017-07-04 LAB — HIV ANTIBODY (ROUTINE TESTING W REFLEX): HIV SCREEN 4TH GENERATION: NONREACTIVE

## 2017-07-04 MED ORDER — SODIUM CHLORIDE 0.9% FLUSH
INTRAVENOUS | Status: AC
  Administered 2017-07-04: 10 mL via INTRAVENOUS
  Filled 2017-07-04: qty 10

## 2017-07-04 MED ORDER — TECHNETIUM TC 99M TETROFOSMIN IV KIT
30.0000 | PACK | Freq: Once | INTRAVENOUS | Status: AC | PRN
  Administered 2017-07-04: 30 via INTRAVENOUS

## 2017-07-04 MED ORDER — REGADENOSON 0.4 MG/5ML IV SOLN
INTRAVENOUS | Status: AC
  Administered 2017-07-04: 0.4 mg via INTRAVENOUS
  Filled 2017-07-04: qty 5

## 2017-07-04 MED ORDER — TECHNETIUM TC 99M TETROFOSMIN IV KIT
10.0000 | PACK | Freq: Once | INTRAVENOUS | Status: AC | PRN
  Administered 2017-07-04: 9.8 via INTRAVENOUS

## 2017-07-04 NOTE — Discharge Summary (Signed)
Physician Discharge Summary  Jamie Mcgee DDU:[id number] DOB: 1960/06/08 DOA: 07/03/2017  PCP: [facility] date: 07/03/2017 Discharge date: 07/04/2017  Admitted From: Home  Disposition: Home  Recommendations for Outpatient Follow-up:  1. Follow up with PCP in 1 weeks 2. Please obtain BMP/CBC in one week 3. Please follow up on the following pending results:  Discharge Condition: STABLE   CODE STATUS: FULL    Brief Hospitalization Summary: Please see all hospital notes, images, labs for full details of the hospitalization. HPI: Jamie Mcgee is a 57 y.o. female with history of hypertension and renal artery dysplasia who came into the hospital today complaining of worsening chest pain.  She actually saw cardiology 5 days ago for evaluation of exertional dyspnea and an abnormal EKG and was scheduled to undergo a Myoview next week as an outpatient.  She states that she was walking in a store when she had tightness in her chest radiating to both shoulder blades, more intense and prolonged than usual she comes to the hospital for evaluation.  Troponin is negative, EKG shows diffuse ST-T wave changes.  Recent CT angios showed no evidence of PE.  She has already been seen by cardiology who was planning on performing stress test in a.m.  Patient was admitted and monitored on continuous telemetry.  The patient was seen by cardiology as well.  Plans were made for an inpatient stress test which the patient had this morning.  There were no signs of ischemia.  No blockages noted.  A low risk study was reported.  The patient had serial troponin testing that remain negative x3.  The patient is going to be discharged home with outpatient follow-up.  She says that she is going to follow-up with the [facility] and she is going to make arrangements for an outpatient pulmonary consultation.  She was strongly advised to avoid tobacco and given written  materials and resources for smoking cessation.  Discharge Diagnoses:  Principal Problem:   Chest pain Active Problems:   Essential hypertension, benign   Tobacco abuse   GERD (gastroesophageal reflux disease)  Discharge Instructions: Discharge Instructions    Call MD for:  difficulty breathing, headache or visual disturbances   Complete by:  As directed    Call MD for:  extreme fatigue   Complete by:  As directed    Call MD for:  severe uncontrolled pain   Complete by:  As directed    Diet - low sodium heart healthy   Complete by:  As directed    Increase activity slowly   Complete by:  As directed      Allergies as of 07/04/2017      Reactions   Lipitor [atorvastatin] Other (See Comments)   Muscle aches and nausea   Naproxen    Stomach pain      Medication List    TAKE these medications   benazepril 40 MG tablet Commonly known as:  LOTENSIN TAKE 1 TABLET BY MOUTH ONCE DAILY   carvedilol 6.25 MG tablet Commonly known as:  COREG TAKE 1 TABLET BY MOUTH TWICE DAILY WITH  A  MEAL   citalopram 20 MG tablet Commonly known as:  CELEXA TAKE ONE TABLET BY MOUTH ONCE DAILY   gabapentin 800 MG tablet Commonly known as:  NEURONTIN TAKE 1 TABLET BY MOUTH THREE TIMES DAILY   hydrochlorothiazide 25 MG tablet Commonly known as:  HYDRODIURIL TAKE 1 TABLET BY MOUTH ONCE DAILY   meloxicam  15 MG tablet Commonly known as:  MOBIC TAKE 1 TABLET BY MOUTH ONCE DAILY   nitroGLYCERIN 0.4 MG SL tablet Commonly known as:  NITROSTAT Place 1 tablet (0.4 mg total) under the tongue every 5 (five) minutes as needed.   omeprazole 20 MG tablet Commonly known as:  PRILOSEC OTC Take 20 mg by mouth daily.      Follow-up Information    [facility]. Schedule an appointment as soon as possible for a visit in 1 week(s).   Why:  Hospital Follow up  Contact information: [place] [id number] [phone]         Allergies   Allergen Reactions  . Lipitor [Atorvastatin] Other (See Comments)    Muscle aches and nausea  . Naproxen     Stomach pain   Allergies as of 07/04/2017      Reactions   Lipitor [atorvastatin] Other (See Comments)   Muscle aches and nausea   Naproxen    Stomach pain      Medication List    TAKE these medications   benazepril 40 MG tablet Commonly known as:  LOTENSIN TAKE 1 TABLET BY MOUTH ONCE DAILY   carvedilol 6.25 MG tablet Commonly known as:  COREG TAKE 1 TABLET BY MOUTH TWICE DAILY WITH  A  MEAL   citalopram 20 MG tablet Commonly known as:  CELEXA TAKE ONE TABLET BY MOUTH ONCE DAILY   gabapentin 800 MG tablet Commonly known as:  NEURONTIN TAKE 1 TABLET BY MOUTH THREE TIMES DAILY   hydrochlorothiazide 25 MG tablet Commonly known as:  HYDRODIURIL TAKE 1 TABLET BY MOUTH ONCE DAILY   meloxicam 15 MG tablet Commonly known as:  MOBIC TAKE 1 TABLET BY MOUTH ONCE DAILY   nitroGLYCERIN 0.4 MG SL tablet Commonly known as:  NITROSTAT Place 1 tablet (0.4 mg total) under the tongue every 5 (five) minutes as needed.   omeprazole 20 MG tablet Commonly known as:  PRILOSEC OTC Take 20 mg by mouth daily.       Procedures/Studies: Dg Chest 2 View  Result Date: 06/29/2017 CLINICAL DATA:  Atypical chest pain EXAM: CHEST  2 VIEW COMPARISON:  June 11, 2017 FINDINGS: There is no edema or consolidation. The heart size and pulmonary vascularity are normal. No adenopathy. No pneumothorax. No bone lesions. IMPRESSION: No edema or consolidation. Electronically Signed   By: Lowella Grip III M.D.   On: 06/29/2017 09:16   Ct Angio Chest Pe W Or Wo Contrast  Result Date: 07/02/2017 CLINICAL DATA:  Shortness of breath for 2-3 weeks. Elevated D-dimer. EXAM: CT ANGIOGRAPHY CHEST WITH CONTRAST TECHNIQUE: Multidetector CT imaging of the chest was performed using the standard protocol during bolus administration of intravenous contrast. Multiplanar CT image reconstructions and MIPs  were obtained to evaluate the vascular anatomy. CONTRAST:  126mL ISOVUE-370 IOPAMIDOL (ISOVUE-370) INJECTION 76% COMPARISON:  None FINDINGS: Cardiovascular: Satisfactory opacification of the pulmonary arteries to the segmental level. No evidence of pulmonary embolism. Normal heart size. No pericardial effusion. No lobar or segmental pulmonary artery filling defects identified. Mediastinum/Nodes: No enlarged mediastinal, hilar, or axillary lymph nodes. Thyroid gland, trachea, and esophagus demonstrate no significant findings. Lungs/Pleura: No pleural effusion identified. No airspace opacities identified. Scar versus subsegmental atelectasis within the right middle lobe, image 99 of series 6. Mild changes of emphysema. Mild diffuse bronchial wall thickening is identified. There are a few scratch set there is a cluster scattered small tree-in-bud nodules within the left lung base, image 108  of series 6. Upper Abdomen: Marked diffuse hepatic steatosis. No acute abnormality. Musculoskeletal: There is mild spondylosis identified within the thoracic spine. No aggressive lytic or sclerotic bone lesions. Review of the MIP images confirms the above findings. IMPRESSION: 1. No evidence for acute pulmonary embolus. 2. Small cluster of tree-in-bud nodules within the left lower lobe compatible with mild inflammatory or infectious bronchiolitis. 3. Hepatic steatosis. Electronically Signed   By: Kerby Moors M.D.   On: 07/02/2017 13:57   Nm Myocar Multi W/spect W/wall Motion / Ef  Result Date: 07/04/2017  No diagnostic ST segment changes to indicate ischemia.  Small, moderate intensity, basal anteroseptal defect that is fixed and most consistent with soft tissue attenuation.  This is a low risk study.  Nuclear stress EF: 68%.    Dg Chest Portable 1 View  Result Date: 07/03/2017 CLINICAL DATA:  57 year old female with a history of chest pain EXAM: PORTABLE CHEST 1 VIEW COMPARISON:  06/29/2017, 06/11/2017, CT chest  07/02/2017 FINDINGS: Cardiomediastinal silhouette unchanged in size and contour. No evidence of central vascular congestion. Coarsened interstitial markings. No confluent airspace disease. Questionable nodular opacities at the bilateral lung bases. No displaced fracture. IMPRESSION: No evidence of lobar pneumonia, however, there is questionable pattern of nodular opacity at the bilateral lung bases, which may correlate to the tree-in-bud opacity on the recent chest CT. Electronically Signed   By: Corrie Mckusick D.O.   On: 07/03/2017 10:26      Subjective: Pt without complaints.   Discharge Exam: Vitals:   07/04/17 0400 07/04/17 0800  BP: 98/60   Pulse: 67   Resp:    Temp: 97.8 F (36.6 C)   SpO2: 95% 96%   Vitals:   07/03/17 2000 07/04/17 0000 07/04/17 0400 07/04/17 0800  BP: 111/66 92/64 98/60    Pulse: 65 74 67   Resp:      Temp:  98 F (36.7 C) 97.8 F (36.6 C)   TempSrc:  Oral Oral   SpO2: 94% 92% 95% 96%  Weight:   92.4 kg (203 lb 11.3 oz)   Height:       General: Pt is alert, awake, not in acute distress Cardiovascular: normal S1/S2 +, no rubs, no gallops Respiratory: CTA bilaterally, no wheezing, no rhonchi Abdominal: Soft, NT, ND, bowel sounds + Extremities: no edema, no cyanosis   The results of significant diagnostics from this hospitalization (including imaging, microbiology, ancillary and laboratory) are listed below for reference.    Microbiology: No results found for this or any previous visit (from the past 240 hour(s)).   Labs: BNP (last 3 results) Recent Labs    06/29/17 1141  BNP 39.7   Basic Metabolic Panel: Recent Labs  Lab 06/29/17 1141 07/03/17 0928  NA 139 136  K 4.4 3.6  CL 100 97*  CO2 23 27  GLUCOSE 97 107*  BUN 8 10  CREATININE 0.88 0.78  CALCIUM 9.2 9.2   Liver Function Tests: Recent Labs  Lab 06/29/17 1141  AST 53*  ALT 43*  ALKPHOS 94  BILITOT 0.3  PROT 6.9  ALBUMIN 3.8   No results for input(s): LIPASE, AMYLASE in  the last 168 hours. No results for input(s): AMMONIA in the last 168 hours. CBC: Recent Labs  Lab 06/29/17 1141 07/03/17 0928  WBC 7.1 8.0  HGB 14.9 14.9  HCT 44.4 44.4  MCV 90 89.3  PLT 330 287   Cardiac Enzymes: Recent Labs  Lab 07/03/17 1709 07/03/17 1955 07/03/17 2308  TROPONINI <0.03 <0.03 <  0.03   BNP: Invalid input(s): POCBNP CBG: Recent Labs  Lab 07/03/17 1004  GLUCAP 112*   D-Dimer No results for input(s): DDIMER in the last 72 hours. Hgb A1c No results for input(s): HGBA1C in the last 72 hours. Lipid Profile No results for input(s): CHOL, HDL, LDLCALC, TRIG, CHOLHDL, LDLDIRECT in the last 72 hours. Thyroid function studies No results for input(s): TSH, T4TOTAL, T3FREE, THYROIDAB in the last 72 hours.  Invalid input(s): FREET3 Anemia work up No results for input(s): VITAMINB12, FOLATE, FERRITIN, TIBC, IRON, RETICCTPCT in the last 72 hours. Urinalysis    Component Value Date/Time   APPEARANCEUR Clear 05/04/2017 1049   GLUCOSEU Negative 05/04/2017 1049   BILIRUBINUR Negative 05/04/2017 1049   PROTEINUR Negative 05/04/2017 1049   UROBILINOGEN negative 03/31/2015 1738   NITRITE Negative 05/04/2017 1049   LEUKOCYTESUR Negative 05/04/2017 1049   Sepsis Labs Invalid input(s): PROCALCITONIN,  WBC,  LACTICIDVEN Microbiology No results found for this or any previous visit (from the past 240 hour(s)).  Time coordinating discharge:   SIGNED:  Irwin Brakeman, MD  [facility] 07/04/2017, 12:09 PM Pager [phone]  If 7PM-7AM, please contact night-coverage [email or web address] Password [email or web address]

## 2017-07-04 NOTE — Discharge Instructions (Signed)
Follow with Primary MD  Sharion Balloon, FNP  and other consultant's as instructed your Hospitalist MD  Please get a complete blood count and chemistry panel checked by your Primary MD at your next visit, and again as instructed by your Primary MD.  Get Medicines reviewed and adjusted: Please take all your medications with you for your next visit with your Primary MD  Laboratory/radiological data: Please request your Primary MD to go over all hospital tests and procedure/radiological results at the follow up, please ask your Primary MD to get all Hospital records sent to his/her office.  In some cases, they will be blood work, cultures and biopsy results pending at the time of your discharge. Please request that your primary care M.D. follows up on these results.  Also Note the following: If you experience worsening of your admission symptoms, develop shortness of breath, life threatening emergency, suicidal or homicidal thoughts you must seek medical attention immediately by calling 911 or calling your MD immediately  if symptoms less severe.  You must read complete instructions/literature along with all the possible adverse reactions/side effects for all the Medicines you take and that have been prescribed to you. Take any new Medicines after you have completely understood and accpet all the possible adverse reactions/side effects.   Do not drive when taking Pain medications or sleeping medications (Benzodaizepines)  Do not take more than prescribed Pain, Sleep and Anxiety Medications. It is not advisable to combine anxiety,sleep and pain medications without talking with your primary care practitioner  Special Instructions: If you have smoked or chewed Tobacco  in the last 2 yrs please stop smoking, stop any regular Alcohol  and or any Recreational drug use.  Wear Seat belts while driving.  Please note: You were cared for by a hospitalist during your hospital stay. Once you are  discharged, your primary care physician will handle any further medical issues. Please note that NO REFILLS for any discharge medications will be authorized once you are discharged, as it is imperative that you return to your primary care physician (or establish a relationship with a primary care physician if you do not have one) for your post hospital discharge needs so that they can reassess your need for medications and monitor your lab values.      Chest Wall Pain Chest wall pain is pain in or around the bones and muscles of your chest. Sometimes, an injury causes this pain. Sometimes, the cause may not be known. This pain may take several weeks or longer to get better. Follow these instructions at home: Pay attention to any changes in your symptoms. Take these actions to help with your pain:  Rest as told by your health care provider.  Avoid activities that cause pain. These include any activities that use your chest muscles or your abdominal and side muscles to lift heavy items.  If directed, apply ice to the painful area: ? Put ice in a plastic bag. ? Place a towel between your skin and the bag. ? Leave the ice on for 20 minutes, 2-3 times per day.  Take over-the-counter and prescription medicines only as told by your health care provider.  Do not use tobacco products, including cigarettes, chewing tobacco, and e-cigarettes. If you need help quitting, ask your health care provider.  Keep all follow-up visits as told by your health care provider. This is important.  Contact a health care provider if:  You have a fever.  Your chest pain becomes worse.  You have new symptoms. Get help right away if:  You have nausea or vomiting.  You feel sweaty or light-headed.  You have a cough with phlegm (sputum) or you cough up blood.  You develop shortness of breath. This information is not intended to replace advice given to you by your health care provider. Make sure you discuss  any questions you have with your health care provider. Document Released: 05/22/2005 Document Revised: 09/30/2015 Document Reviewed: 08/17/2014 Elsevier Interactive Patient Education  2018 [place].   Chest Wall Pain Chest wall pain is pain in or around the bones and muscles of your chest. Sometimes, an injury causes this pain. Sometimes, the cause may not be known. This pain may take several weeks or longer to get better. Follow these instructions at home: Pay attention to any changes in your symptoms. Take these actions to help with your pain:  Rest as told by your doctor.  Avoid activities that cause pain. Try not to use your chest, belly (abdominal), or side muscles to lift heavy things.  If directed, apply ice to the painful area: ? Put ice in a plastic bag. ? Place a towel between your skin and the bag. ? Leave the ice on for 20 minutes, 2-3 times per day.  Take over-the-counter and prescription medicines only as told by your doctor.  Do not use tobacco products, including cigarettes, chewing tobacco, and e-cigarettes. If you need help quitting, ask your doctor.  Keep all follow-up visits as told by your doctor. This is important.  Contact a doctor if:  You have a fever.  Your chest pain gets worse.  You have new symptoms. Get help right away if:  You feel sick to your stomach (nauseous) or you throw up (vomit).  You feel sweaty or light-headed.  You have a cough with phlegm (sputum) or you cough up blood.  You are short of breath. This information is not intended to replace advice given to you by your health care provider. Make sure you discuss any questions you have with your health care provider. Document Released: 11/08/2007 Document Revised: 10/28/2015 Document Reviewed: 08/17/2014 Elsevier Interactive Patient Education  2018 [employer].     Exercise Stress Electrocardiogram An exercise stress electrocardiogram is a test that is done to evaluate the  blood supply to your heart. This test may also be called exercise stress electrocardiography. The test is done while you are walking on a treadmill. The goal of this test is to raise your heart rate. This test is done to find areas of poor blood flow to the heart by determining the extent of coronary artery disease (CAD). CAD is defined as narrowing in one or more heart (coronary) arteries of more than 70%. If you have an abnormal test result, this may mean that you are not getting adequate blood flow to your heart during exercise. Additional testing may be needed to understand why your test was abnormal. Tell a health care provider about:  Any allergies you have.  All medicines you are taking, including vitamins, herbs, eye drops, creams, and over-the-counter medicines.  Any problems you or family members have had with anesthetic medicines.  Any blood disorders you have.  Any surgeries you have had.  Any medical conditions you have.  Possibility of pregnancy, if this applies. What are the risks? Generally, this is a safe procedure. However, as with any procedure, complications can occur. Possible complications can include:  Pain or pressure in the following areas: ? Chest. ?  Jaw or neck. ? Between your shoulder blades. ? Radiating down your left arm.  Dizziness or light-headedness.  Shortness of breath.  Increased or irregular heartbeats.  Nausea or vomiting.  Heart attack (rare).  What happens before the procedure?  Avoid all forms of caffeine 24 hours before your test or as directed by your health care provider. This includes coffee, tea (even decaffeinated tea), caffeinated sodas, chocolate, cocoa, and certain pain medicines.  Follow your health care provider's instructions regarding eating and drinking before the test.  Take your medicines as directed at regular times with water unless instructed otherwise. Exceptions may include: ? If you have diabetes, ask how you  are to take your insulin or pills. It is common to adjust insulin dosing the morning of the test. ? If you are taking beta-blocker medicines, it is important to talk to your health care provider about these medicines well before the date of your test. Taking beta-blocker medicines may interfere with the test. In some cases, these medicines need to be changed or stopped 24 hours or more before the test. ? If you wear a nitroglycerin patch, it may need to be removed prior to the test. Ask your health care provider if the patch should be removed before the test.  If you use an inhaler for any breathing condition, bring it with you to the test.  If you are an outpatient, bring a snack so you can eat right after the stress phase of the test.  Do not smoke for 4 hours prior to the test or as directed by your health care provider.  Do not apply lotions, powders, creams, or oils on your chest prior to the test.  Wear loose-fitting clothes and comfortable shoes for the test. This test involves walking on a treadmill. What happens during the procedure?  Multiple patches (electrodes) will be put on your chest. If needed, small areas of your chest may have to be shaved to get better contact with the electrodes. Once the electrodes are attached to your body, multiple wires will be attached to the electrodes and your heart rate will be monitored.  Your heart will be monitored both at rest and while exercising.  You will walk on a treadmill. The treadmill will be started at a slow pace. The treadmill speed and incline will gradually be increased to raise your heart rate. What happens after the procedure?  Your heart rate and blood pressure will be monitored after the test.  You may return to your normal schedule including diet, activities, and medicines, unless your health care provider tells you otherwise. This information is not intended to replace advice given to you by your health care provider. Make  sure you discuss any questions you have with your health care provider. Document Released: 05/19/2000 Document Revised: 10/28/2015 Document Reviewed: 01/27/2013 Elsevier Interactive Patient Education  2017 [employer]   Nonspecific Chest Pain Chest pain can be caused by many different conditions. There is always a chance that your pain could be related to something serious, such as a heart attack or a blood clot in your lungs. Chest pain can also be caused by conditions that are not life-threatening. If you have chest pain, it is very important to follow up with your health care provider. What are the causes? Causes of this condition include:  Heartburn.  Pneumonia or bronchitis.  Anxiety or stress.  Inflammation around your heart (pericarditis) or lung (pleuritis or pleurisy).  A blood clot in your lung.  A collapsed lung (pneumothorax). This can develop suddenly on its own (spontaneous pneumothorax) or from trauma to the chest.  Shingles infection (varicella-zoster virus).  Heart attack.  Damage to the bones, muscles, and cartilage that make up your chest wall. This can include: ? Bruised bones due to injury. ? Strained muscles or cartilage due to frequent or repeated coughing or overwork. ? Fracture to one or more ribs. ? Sore cartilage due to inflammation (costochondritis).  What increases the risk? Risk factors for this condition may include:  Activities that increase your risk for trauma or injury to your chest.  Respiratory infections or conditions that cause frequent coughing.  Medical conditions or overeating that can cause heartburn.  Heart disease or family history of heart disease.  Conditions or health behaviors that increase your risk of developing a blood clot.  Having had chicken pox (varicella zoster).  What are the signs or symptoms? Chest pain can feel like:  Burning or tingling on the surface of your chest or deep in your chest.  Crushing,  pressure, aching, or squeezing pain.  Dull or sharp pain that is worse when you move, cough, or take a deep breath.  Pain that is also felt in your back, neck, shoulder, or arm, or pain that spreads to any of these areas.  Your chest pain may come and go, or it may stay constant. How is this diagnosed? Lab tests or other studies may be needed to find the cause of your pain. Your health care provider may have you take a test called an ECG (electrocardiogram). An ECG records your heartbeat patterns at the time the test is performed. You may also have other tests, such as:  Transthoracic echocardiogram (TTE). In this test, sound waves are used to create a picture of the heart structures and to look at how blood flows through your heart.  Transesophageal echocardiogram (TEE).This is a more advanced imaging test that takes images from inside your body. It allows your health care provider to see your heart in finer detail.  Cardiac monitoring. This allows your health care provider to monitor your heart rate and rhythm in real time.  Holter monitor. This is a portable device that records your heartbeat and can help to diagnose abnormal heartbeats. It allows your health care provider to track your heart activity for several days, if needed.  Stress tests. These can be done through exercise or by taking medicine that makes your heart beat more quickly.  Blood tests.  Other imaging tests.  How is this treated? Treatment depends on what is causing your chest pain. Treatment may include:  Medicines. These may include: ? Acid blockers for heartburn. ? Anti-inflammatory medicine. ? Pain medicine for inflammatory conditions. ? Antibiotic medicine, if an infection is present. ? Medicines to dissolve blood clots. ? Medicines to treat coronary artery disease (CAD).  Supportive care for conditions that do not require medicines. This may include: ? Resting. ? Applying heat or cold packs to injured  areas. ? Limiting activities until pain decreases.  Follow these instructions at home: Medicines  If you were prescribed an antibiotic, take it as told by your health care provider. Do not stop taking the antibiotic even if you start to feel better.  Take over-the-counter and prescription medicines only as told by your health care provider. Lifestyle  Do not use any products that contain nicotine or tobacco, such as cigarettes and e-cigarettes. If you need help quitting, ask your health care provider.  Do not  drink alcohol.  Make lifestyle changes as directed by your health care provider. These may include: ? Getting regular exercise. Ask your health care provider to suggest some activities that are safe for you. ? Eating a heart-healthy diet. A registered dietitian can help you to learn healthy eating options. ? Maintaining a healthy weight. ? Managing diabetes, if necessary. ? Reducing stress, such as with yoga or relaxation techniques. General instructions  Avoid any activities that bring on chest pain.  If heartburn is the cause for your chest pain, raise (elevate) the head of your bed about 6 inches (15 cm) by putting blocks under the legs. Sleeping with more pillows does not effectively relieve heartburn because it only changes the position of your head.  Keep all follow-up visits as told by your health care provider. This is important. This includes any further testing if your chest pain does not go away. Contact a health care provider if:  Your chest pain does not go away.  You have a rash with blisters on your chest.  You have a fever.  You have chills. Get help right away if:  Your chest pain is worse.  You have a cough that gets worse, or you cough up blood.  You have severe pain in your abdomen.  You have severe weakness.  You faint.  You have sudden, unexplained chest discomfort.  You have sudden, unexplained discomfort in your arms, back, neck, or  jaw.  You have shortness of breath at any time.  You suddenly start to sweat, or your skin gets clammy.  You feel nauseous or you vomit.  You suddenly feel light-headed or dizzy.  Your heart begins to beat quickly, or it feels like it is skipping beats. These symptoms may represent a serious problem that is an emergency. Do not wait to see if the symptoms will go away. Get medical help right away. Call your local emergency services (911 in the [place] Do not drive yourself to the hospital. This information is not intended to replace advice given to you by your health care provider. Make sure you discuss any questions you have with your health care provider. Document Released: 03/01/2005 Document Revised: 02/14/2016 Document Reviewed: 02/14/2016 Elsevier Interactive Patient Education  2017 [employer] Inc[employer]  Pain Without a Known Cause Pain can occur in any part of the body and can range from mild to severe. Sometimes no cause can be found for why you are having pain. Some types of pain that can occur without a known cause include:  Headache.  Back pain.  Abdominal pain.  Neck pain.  How is this diagnosed? Your health care provider will try to find the cause of your pain. This may include:  Physical exam.  Medical history.  Blood tests.  Urine tests.  X-rays.  If no cause is found, your health care provider may diagnose you with pain without a known cause. How is this treated? Treatment depends on the kind of pain you have. Your health care provider may prescribe medicines to help relieve your pain. Follow these instructions at home:  Take medicines only as directed by your health care provider.  Stop any activities that cause pain. During periods of severe pain, bed rest may help.  Try to reduce your stress with activities such as yoga or meditation. Talk to your health care provider for other stress-reducing activity recommendations.  Exercise regularly, if approved by  your health care provider.  Eat a healthy diet that includes fruits and vegetables.  This may improve pain. Talk to your health care provider if you have any questions about your diet. Contact a health care provider if: If you have a painful condition and no reason can be found for the pain or the pain gets worse, it is important to follow up with your health care provider. It may be necessary to repeat tests and look further for a possible cause. This information is not intended to replace advice given to you by your health care provider. Make sure you discuss any questions you have with your health care provider. Document Released: 02/14/2001 Document Revised: 10/28/2015 Document Reviewed: 10/07/2013 Elsevier Interactive Patient Education  2018 [place].   Nonspecific Chest Pain Chest pain can be caused by many different conditions. There is a chance that your pain could be related to something serious, such as a heart attack or a blood clot in your lungs. Chest pain can also be caused by conditions that are not life-threatening. If you have chest pain, it is very important to follow up with your doctor. Follow these instructions at home: Medicines  If you were prescribed an antibiotic medicine, take it as told by your doctor. Do not stop taking the antibiotic even if you start to feel better.  Take over-the-counter and prescription medicines only as told by your doctor. Lifestyle  Do not use any products that contain nicotine or tobacco, such as cigarettes and e-cigarettes. If you need help quitting, ask your doctor.  Do not drink alcohol.  Make lifestyle changes as told by your doctor. These may include: ? Getting regular exercise. Ask your doctor for some activities that are safe for you. ? Eating a heart-healthy diet. A diet specialist (dietitian) can help you to learn healthy eating options. ? Staying at a healthy weight. ? Managing diabetes, if needed. ? Lowering your stress,  as with deep breathing or spending time in nature. General instructions  Avoid any activities that make you feel chest pain.  If your chest pain is because of heartburn: ? Raise (elevate) the head of your bed about 6 inches (15 cm). You can do this by putting blocks under the bed legs at the head of the bed. ? Do not sleep with extra pillows under your head. That does not help heartburn.  Keep all follow-up visits as told by your doctor. This is important. This includes any further testing if your chest pain does not go away. Contact a doctor if:  Your chest pain does not go away.  You have a rash with blisters on your chest.  You have a fever.  You have chills. Get help right away if:  Your chest pain is worse.  You have a cough that gets worse, or you cough up blood.  You have very bad (severe) pain in your belly (abdomen).  You are very weak.  You pass out (faint).  You have either of these for no clear reason: ? Sudden chest discomfort. ? Sudden discomfort in your arms, back, neck, or jaw.  You have shortness of breath at any time.  You suddenly start to sweat, or your skin gets clammy.  You feel sick to your stomach (nauseous).  You throw up (vomit).  You suddenly feel light-headed or dizzy.  Your heart starts to beat fast, or it feels like it is skipping beats. These symptoms may be an emergency. Do not wait to see if the symptoms will go away. Get medical help right away. Call your local emergency services (  911 in the [place] Do not drive yourself to the hospital. This information is not intended to replace advice given to you by your health care provider. Make sure you discuss any questions you have with your health care provider. Document Released: 11/08/2007 Document Revised: 02/14/2016 Document Reviewed: 02/14/2016 Elsevier Interactive Patient Education  2017 [employer].

## 2017-07-04 NOTE — Progress Notes (Signed)
[facility] called and said they were hoping to take pt for stress test at 0600 but they realized she does not have an active order. I paged on call MD and they said order needed to be placed by cardiologist. I looked on [facility] is listed for tonight. I called them and spoke with Baxter Flattery, she stated they cannot place order either. It can only be placed by a cardiologist. Will pass information on to day shift RN. I called [facility] back and spoke to [place]. He suggested pt stay NPO throughout the night since stress test will occur tomorrow after order for stress test is placed.

## 2017-07-04 NOTE — Progress Notes (Signed)
Jamie Mcgee discharged Home per MD order.  Discharge instructions reviewed and discussed with the patient, all questions and concerns answered. Copy of instructions given to patient.  Allergies as of 07/04/2017      Reactions   Lipitor [atorvastatin] Other (See Comments)   Muscle aches and nausea   Naproxen    Stomach pain      Medication List    TAKE these medications   benazepril 40 MG tablet Commonly known as:  LOTENSIN TAKE 1 TABLET BY MOUTH ONCE DAILY   carvedilol 6.25 MG tablet Commonly known as:  COREG TAKE 1 TABLET BY MOUTH TWICE DAILY WITH  A  MEAL   citalopram 20 MG tablet Commonly known as:  CELEXA TAKE ONE TABLET BY MOUTH ONCE DAILY   gabapentin 800 MG tablet Commonly known as:  NEURONTIN TAKE 1 TABLET BY MOUTH THREE TIMES DAILY   hydrochlorothiazide 25 MG tablet Commonly known as:  HYDRODIURIL TAKE 1 TABLET BY MOUTH ONCE DAILY   meloxicam 15 MG tablet Commonly known as:  MOBIC TAKE 1 TABLET BY MOUTH ONCE DAILY   nitroGLYCERIN 0.4 MG SL tablet Commonly known as:  NITROSTAT Place 1 tablet (0.4 mg total) under the tongue every 5 (five) minutes as needed.   omeprazole 20 MG tablet Commonly known as:  PRILOSEC OTC Take 20 mg by mouth daily.       Patients skin is clean, dry and intact, no evidence of skin break down. IV site discontinued and catheter remains intact. Site without signs and symptoms of complications. Dressing and pressure applied.  Patient ambulated to the elevator with nurse escort,  no distress noted upon discharge.  Jamie Mcgee 07/04/2017 1:10 PM

## 2017-07-04 NOTE — Progress Notes (Signed)
Progress Note  Patient Name: Jamie Mcgee Date of Encounter: 07/04/2017  Primary Cardiologist: Kate Sable, MD   Subjective   Complains of cough from recent bronchitis  Inpatient Medications    Scheduled Meds: . benazepril  40 mg Oral QHS  . carvedilol  6.25 mg Oral BID WC  . citalopram  20 mg Oral Daily  . enoxaparin (LOVENOX) injection  40 mg Subcutaneous Q24H  . gabapentin  800 mg Oral TID  . hydrochlorothiazide  25 mg Oral Daily  . pantoprazole  40 mg Oral Daily    PRN Meds: acetaminophen, gi cocktail, nitroGLYCERIN, ondansetron (ZOFRAN) IV   Vital Signs    Vitals:   07/03/17 2000 07/04/17 0000 07/04/17 0400 07/04/17 0800  BP: 111/66 92/64 98/60    Pulse: 65 74 67   Resp:      Temp:  98 F (36.7 C) 97.8 F (36.6 C)   TempSrc:  Oral Oral   SpO2: 94% 92% 95% 96%  Weight:   203 lb 11.3 oz (92.4 kg)   Height:       No intake or output data in the 24 hours ending 07/04/17 0915 Filed Weights   07/03/17 0916 07/04/17 0400  Weight: 203 lb (92.1 kg) 203 lb 11.3 oz (92.4 kg)    Telemetry    NSR - Personally Reviewed  ECG    NSR no acute change - Personally Reviewed  Physical Exam    GEN: No acute distress.   Neck: No JVD Cardiac: RRR, no murmurs, rubs, or gallops.  Respiratory: Decreased breath sounds throughout.Clear to auscultation bilaterally. GI: Soft, nontender, non-distended  MS: No edema; No deformity. Neuro:  Nonfocal  Psych: Normal affect   Labs    Chemistry Recent Labs  Lab 06/29/17 1141 07/03/17 0928  NA 139 136  K 4.4 3.6  CL 100 97*  CO2 23 27  GLUCOSE 97 107*  BUN 8 10  CREATININE 0.88 0.78  CALCIUM 9.2 9.2  PROT 6.9  --   ALBUMIN 3.8  --   AST 53*  --   ALT 43*  --   ALKPHOS 94  --   BILITOT 0.3  --   GFRNONAA 73 >60  GFRAA 84 >60  ANIONGAP  --  12     Hematology Recent Labs  Lab 06/29/17 1141 07/03/17 0928  WBC 7.1 8.0  RBC 4.93 4.97  HGB 14.9 14.9  HCT 44.4 44.4  MCV 90 89.3  MCH 30.2 30.0    MCHC 33.6 33.6  RDW 13.8 13.2  PLT 330 287    Cardiac Enzymes Recent Labs  Lab 07/03/17 1709 07/03/17 1955 07/03/17 2308  TROPONINI <0.03 <0.03 <0.03    Recent Labs  Lab 07/03/17 1008 07/03/17 1309  TROPIPOC 0.00 0.00     BNP Recent Labs  Lab 06/29/17 1141  BNP 45.6     DDimer  Recent Labs  Lab 06/29/17 1141  DDIMER 0.60*     Radiology    Ct Angio Chest Pe W Or Wo Contrast  Result Date: 07/02/2017 CLINICAL DATA:  Shortness of breath for 2-3 weeks. Elevated D-dimer. EXAM: CT ANGIOGRAPHY CHEST WITH CONTRAST TECHNIQUE: Multidetector CT imaging of the chest was performed using the standard protocol during bolus administration of intravenous contrast. Multiplanar CT image reconstructions and MIPs were obtained to evaluate the vascular anatomy. CONTRAST:  149mL ISOVUE-370 IOPAMIDOL (ISOVUE-370) INJECTION 76% COMPARISON:  None FINDINGS: Cardiovascular: Satisfactory opacification of the pulmonary arteries to the segmental level. No evidence of pulmonary embolism. Normal  heart size. No pericardial effusion. No lobar or segmental pulmonary artery filling defects identified. Mediastinum/Nodes: No enlarged mediastinal, hilar, or axillary lymph nodes. Thyroid gland, trachea, and esophagus demonstrate no significant findings. Lungs/Pleura: No pleural effusion identified. No airspace opacities identified. Scar versus subsegmental atelectasis within the right middle lobe, image 99 of series 6. Mild changes of emphysema. Mild diffuse bronchial wall thickening is identified. There are a few scratch set there is a cluster scattered small tree-in-bud nodules within the left lung base, image 108 of series 6. Upper Abdomen: Marked diffuse hepatic steatosis. No acute abnormality. Musculoskeletal: There is mild spondylosis identified within the thoracic spine. No aggressive lytic or sclerotic bone lesions. Review of the MIP images confirms the above findings. IMPRESSION: 1. No evidence for acute  pulmonary embolus. 2. Small cluster of tree-in-bud nodules within the left lower lobe compatible with mild inflammatory or infectious bronchiolitis. 3. Hepatic steatosis. Electronically Signed   By: Kerby Moors M.D.   On: 07/02/2017 13:57   Dg Chest Portable 1 View  Result Date: 07/03/2017 CLINICAL DATA:  57 year old female with a history of chest pain EXAM: PORTABLE CHEST 1 VIEW COMPARISON:  06/29/2017, 06/11/2017, CT chest 07/02/2017 FINDINGS: Cardiomediastinal silhouette unchanged in size and contour. No evidence of central vascular congestion. Coarsened interstitial markings. No confluent airspace disease. Questionable nodular opacities at the bilateral lung bases. No displaced fracture. IMPRESSION: No evidence of lobar pneumonia, however, there is questionable pattern of nodular opacity at the bilateral lung bases, which may correlate to the tree-in-bud opacity on the recent chest CT. Electronically Signed   By: Corrie Mckusick D.O.   On: 07/03/2017 10:26    Cardiac Studies   Lexiscan Myoview 03/09/2015:  The study is normal.  This is a low risk study.  Nuclear stress EF: 51%.  Significant inferior wall soft tissue attenuation artifact noted.   Echocardiogram 03/09/2015: Study Conclusions   - Left ventricle: The cavity size was normal. Wall thickness was   increased in a pattern of mild LVH. Systolic function was normal.   The estimated ejection fraction was in the range of 60% to 65%.   Wall motion was normal; there were no regional wall motion   abnormalities. Indeterminate diastolic function.   Patient Profile     57 y.o. female admitted with chest pain, troponins negative x3. Lexiscan today  Assessment & Plan    Chest pain, troponins negative. Lexiscan done today, chest tightness and shortness of breath with lexi, no ekg changes. Await images.  HTN  Renal artery fibromuscular dysplasia with angioplasty 2006  Abn CTA with small cluster of tree-in-bud nodules within the  left lower lobe and recent bronchitis   For questions or updates, please contact [facility] Please consult [email or web address] for contact info under Cardiology/STEMI.      Signed, Ermalinda Barrios, PA-C  07/04/2017, 9:15 AM     Attending note:  Reviewed hospital course since my consultation with patient yesterday afternoon.  She was seen in the cardiac stress lab this morning by Ms. Bonnell Public PA-C in the setting of [place].  She has ruled out for ACS by troponin I levels.  Plan to follow-up on stress test results when available.  Further recommendations to follow.  Satira Sark, M.D., F.A.C.C.

## 2017-07-09 ENCOUNTER — Encounter (HOSPITAL_COMMUNITY): Payer: BLUE CROSS/BLUE SHIELD

## 2017-07-09 ENCOUNTER — Ambulatory Visit (HOSPITAL_COMMUNITY): Payer: BLUE CROSS/BLUE SHIELD

## 2017-07-11 NOTE — Telephone Encounter (Signed)
Discussed with Dr. Darnell Level

## 2017-07-12 ENCOUNTER — Ambulatory Visit: Payer: BLUE CROSS/BLUE SHIELD | Admitting: Internal Medicine

## 2017-07-12 ENCOUNTER — Encounter: Payer: Self-pay | Admitting: Internal Medicine

## 2017-07-12 VITALS — BP 132/82 | HR 95 | Ht 64.0 in | Wt 202.4 lb

## 2017-07-12 DIAGNOSIS — I10 Essential (primary) hypertension: Secondary | ICD-10-CM

## 2017-07-12 DIAGNOSIS — J431 Panlobular emphysema: Secondary | ICD-10-CM | POA: Insufficient documentation

## 2017-07-12 DIAGNOSIS — J449 Chronic obstructive pulmonary disease, unspecified: Secondary | ICD-10-CM | POA: Diagnosis not present

## 2017-07-12 DIAGNOSIS — F17210 Nicotine dependence, cigarettes, uncomplicated: Secondary | ICD-10-CM

## 2017-07-12 MED ORDER — OLMESARTAN MEDOXOMIL 20 MG PO TABS: 20.0000 mg | ORAL_TABLET | Freq: Every day | ORAL | 11 refills | Status: DC

## 2017-07-12 MED ORDER — MOMETASONE FURO-FORMOTEROL FUM 200-5 MCG/ACT IN AERO: 2.0000 | INHALATION_SPRAY | Freq: Two times a day (BID) | RESPIRATORY_TRACT | 0 refills | Status: DC

## 2017-07-12 NOTE — Assessment & Plan Note (Addendum)
In the best review of chronic cough to date ( NEJM 2016 375 [phone]) ,  ACEi are now felt to cause cough in up to  20% of pts which is a 4 fold increase from previous reports and does not include the variety of non-specific complaints we see in pulmonary clinic in pts on ACEi but previously attributed to another dx like  Copd/asthma and  include PNDS, throat and chest congestion, "bronchitis", unexplained dyspnea and noct "strangling" sensations, and hoarseness, but also  atypical /refractory GERD symptoms like dysphagia and "bad heartburn"   The only way I know  to prove this is not an "ACEi Case" is a trial off ACEi x a minimum of 6 weeks then regroup.   Try benicar 20 mg daily

## 2017-07-12 NOTE — Progress Notes (Signed)
Subjective:     Patient ID: Jamie Mcgee, female   DOB: 25-Dec-1960,    MRN: [id number]  HPI   57 yowf active smoker with onset of sinuses problems year round starting late 20s  Better with allegra with pattern of recurrent bronchitis winters typically about every year since around 2012 req short term saba and no maint rx referred to pulmonary clinic 07/12/2017 by Dr Lenon Curt s/p admit:  Admit date: 07/03/2017 Discharge date: 07/04/2017    GGY:IRSWNIO K Tysonis a 57 y.o.femalewith history of hypertension and renal artery dysplasia who came into the hospital today complaining of worsening chest pain. She actually saw cardiology 5 days ago for evaluation of exertional dyspnea and an abnormal EKG and was scheduled to undergo a Myoview next week as an outpatient. She states that she was walking in a store when she had tightness in her chest radiating to both shoulder blades, more intense and prolonged than usual she comes to the hospital for evaluation. Troponin is negative, EKG shows diffuse ST-T wave changes. Recent CT angios showed no evidence of PE. She has already been seen by cardiology who was planning on performing stress test in a.m.  Patient was admitted and monitored on continuous telemetry.  The patient was seen by cardiology as well.  Plans were made for an inpatient stress test which the patient had this morning.  There were no signs of ischemia and nl EF.  No blockages noted.  A low risk study was reported.  The patient had serial troponin testing that remain negative x3.  The patient is going to be discharged home with outpatient follow-up.  She says that she is going to follow-up with the [facility] and she is going to make arrangements for an outpatient pulmonary consultation.  She was strongly advised to avoid tobacco and given written materials and resources for smoking cessation.  Discharge Diagnoses:  Principal Problem:   Chest  pain Active Problems:   Essential hypertension, benign   Tobacco abuse   GERD (gastroesophageal reflux disease)      07/12/2017 1st [facility] office visit/ [facility]   Chief Complaint  Patient presents with  . pulmonary consult    SOB with any level of activity.-cough productive  onset x 4 m insidious progressive to point where  doe x down one aisle at [employer]  = MMRC3 = can't walk 100 yards even at a slow Mcgee at a flat grade s stopping due to sob   Sleep 30 degrees ok  Continues to cough day > noct , mucoid  No obvious day to day or daytime variability or assoc excess/ purulent sputum or mucus plugs or hemoptysis or   chest tightness, subjective wheeze or overt sinus or hb symptoms. No unusual exposure hx or h/o childhood pna/ asthma or knowledge of premature birth.  Sleeping ok flat without nocturnal  or early am exacerbation  of respiratory  c/o's or need for noct saba. Also denies any obvious fluctuation of symptoms with weather or environmental changes or other aggravating or alleviating factors except as outlined above   Current Allergies, Complete Past Medical History, Past Surgical History, Family History, and Social History were reviewed in [employer] record.  ROS  The following are not active complaints unless bolded Hoarseness, sore throat, dysphagia, dental problems, itching, sneezing,  nasal congestion or discharge of excess mucus or purulent secretions, ear ache,   fever, chills, sweats, unintended wt loss or wt gain, classically pleuritic  or exertional cp,  orthopnea pnd or leg swelling, presyncope, palpitations, abdominal pain, anorexia, nausea, vomiting, diarrhea  or change in bowel habits or change in bladder habits, change in stools or change in urine, dysuria, hematuria,  rash, arthralgias, visual complaints, headache, numbness, weakness or ataxia or problems with walking or coordination,  change in mood/affect or memory.        Current Meds   Medication Sig  . carvedilol (COREG) 6.25 MG tablet TAKE 1 TABLET BY MOUTH TWICE DAILY WITH  A  MEAL  . citalopram (CELEXA) 20 MG tablet TAKE ONE TABLET BY MOUTH ONCE DAILY  . gabapentin (NEURONTIN) 800 MG tablet TAKE 1 TABLET BY MOUTH THREE TIMES DAILY  . hydrochlorothiazide (HYDRODIURIL) 25 MG tablet TAKE 1 TABLET BY MOUTH ONCE DAILY  . nitroGLYCERIN (NITROSTAT) 0.4 MG SL tablet Place 1 tablet (0.4 mg total) under the tongue every 5 (five) minutes as needed.  Marland Kitchen omeprazole (PRILOSEC OTC) 20 MG tablet Take 20 mg by mouth daily.  . [ benazepril (LOTENSIN) 40 MG tablet TAKE 1 TABLET BY MOUTH ONCE DAILY         Review of Systems     Objective:   Physical Exam    amb obese wf nad  Wt Readings from Last 3 Encounters:  07/12/17 202 lb 6.4 oz (91.8 kg)  07/04/17 203 lb 11.3 oz (92.4 kg)  06/29/17 203 lb 12.8 oz (92.4 kg)     Vital signs reviewed - Note on arrival 02 sats  95% on RA   And BP  132/82    HEENT: nl dentition, turbinates bilaterally, and oropharynx. Nl external ear canals without cough reflex   NECK :  without JVD/Nodes/TM/ nl carotid upstrokes bilaterally   LUNGS: no acc muscle use,  Slt barrel  contour chest wall with bilateral  Distant late exp wheeze better with plm  without cough on insp or exp maneuver and nl to  percussion     CV:  RRR  no s3 or murmur or increase in P2, and no edema   ABD:  Obese/ soft and nontender with nl inspiratory excursion in the supine position. No bruits or organomegaly appreciated, bowel sounds nl  MS:  Nl gait/ ext warm without deformities, calf tenderness, cyanosis or clubbing No obvious joint restrictions   SKIN: warm and dry without lesions    NEURO:  alert, approp, nl sensorium with  no motor or cerebellar deficits apparent.      I personally reviewed images and agree with radiology impression as follows:   Chest CTa 07/02/17 1. No evidence for acute pulmonary embolus. 2. Small cluster of tree-in-bud nodules within  the left lower lobe compatible with mild inflammatory or infectious bronchiolitis. 3. Hepatic steatosis.    Assessment:

## 2017-07-12 NOTE — Patient Instructions (Addendum)
Stop benazapril and start benicar 20mg  daily instead - if blood pressure too high take twice daily   The key is to stop smoking completely before smoking completely stops you!   Dulera 100 Take 2 puffs first thing in am and then another 2 puffs about 12 hours later.   Work on inhaler technique:  relax and gently blow all the way out then take a nice smooth deep breath back in, triggering the inhaler at same time you start breathing in.  Hold for up to 5 seconds if you can. Blow out thru nose. Rinse and gargle with water when done      Please schedule a follow up office visit in 6 weeks, call sooner if needed with full pfts - bring all medications with you

## 2017-07-13 ENCOUNTER — Encounter: Payer: Self-pay | Admitting: Internal Medicine

## 2017-07-13 NOTE — Assessment & Plan Note (Addendum)
Spirometry 07/12/2017  FEV1 1.01 (38%)  Ratio 65 with mod curvature   - 07/12/2017  After extensive coaching inhaler device  effectiveness =  75% > try dulera 100 2bid and off acei   DDX of  difficult airways management almost all start with A and  include Adherence, Ace Inhibitors, Acid Reflux, Active Sinus Disease, Alpha 1 Antitripsin deficiency, Anxiety masquerading as Airways dz,  ABPA,  Allergy(esp in young), Aspiration (esp in elderly), Adverse effects of meds,  Active smokers, A bunch of PE's (a small clot burden can't cause this syndrome unless there is already severe underlying pulm or vascular dz with poor reserve) plus two Bs  = Bronchiectasis and Beta blocker use..and one C= CHF  Adherence is always the initial "prime suspect" and is a multilayered concern that requires a "trust but verify" approach in every patient - starting with knowing how to use medications, especially inhalers, correctly, keeping up with refills and understanding the fundamental difference between maintenance and prns vs those medications only taken for a very short course and then stopped and not refilled.  - see hfa teaching  - return with all meds in hand using a trust but verify approach to confirm accurate Medication  Reconciliation The principal here is that until we are certain that the  patients are doing what we've asked, it makes no sense to ask them to do more.    Active smoking (see separate a/p)   ACEi adverse effects at the  top of the usual list of suspects and the only way to rule it out is a trial off > see a/p   ? Acid (or non-acid) GERD > always difficult to exclude as up to 75% of pts in some series report no assoc GI/ Heartburn symptoms> rec continue max (24h)  acid suppression and diet restrictions/ reviewed     ? Anxiety > usually at the bottom of this list of usual suspects but should be   higher on this pt's based on H and P and note already on psychotropics .  ? Allergy/asthma > doubt this is  much of a component so low dose ics will suffice for now   F/u in 6 weeks with full pfts   Total time devoted to counseling  > 50 % of initial 60 min office visit:  review case with pt/ discussion of options/alternatives/ personally creating written customized instructions  in presence of pt  then going over those specific  Instructions directly with the pt including how to use all of the meds but in particular covering each new medication in detail and the difference between the maintenance= "automatic" meds and the prns using an action plan format for the latter (If this problem/symptom => do that organization reading Left to right).  Please see AVS from this visit for a full list of these instructions which I personally wrote for this pt and  are unique to this visit.

## 2017-07-13 NOTE — Assessment & Plan Note (Signed)

## 2017-07-16 ENCOUNTER — Encounter: Payer: Self-pay | Admitting: Family Medicine

## 2017-07-16 ENCOUNTER — Ambulatory Visit (INDEPENDENT_AMBULATORY_CARE_PROVIDER_SITE_OTHER): Payer: BLUE CROSS/BLUE SHIELD | Admitting: Family Medicine

## 2017-07-16 VITALS — BP 111/77 | HR 96 | Temp 97.5°F | Ht 65.0 in | Wt 204.0 lb

## 2017-07-16 DIAGNOSIS — R945 Abnormal results of liver function studies: Secondary | ICD-10-CM

## 2017-07-16 DIAGNOSIS — R7989 Other specified abnormal findings of blood chemistry: Secondary | ICD-10-CM

## 2017-07-16 DIAGNOSIS — Z09 Encounter for follow-up examination after completed treatment for conditions other than malignant neoplasm: Secondary | ICD-10-CM

## 2017-07-16 DIAGNOSIS — G8929 Other chronic pain: Secondary | ICD-10-CM | POA: Diagnosis not present

## 2017-07-16 DIAGNOSIS — R079 Chest pain, unspecified: Secondary | ICD-10-CM | POA: Diagnosis not present

## 2017-07-16 DIAGNOSIS — J449 Chronic obstructive pulmonary disease, unspecified: Secondary | ICD-10-CM | POA: Diagnosis not present

## 2017-07-16 DIAGNOSIS — M546 Pain in thoracic spine: Secondary | ICD-10-CM

## 2017-07-16 NOTE — Patient Instructions (Signed)
You had labs performed today.  You will be contacted with the results of the labs once they are available, usually in the next 3 days for routine lab work.  Placed a referral to the orthopedist for further evaluation of your chronic back pain.  We did discuss physical therapy but you would like to hold off on this until you see them.  Please follow-up with me as needed after you have seen your specialist.   Back Pain, Adult Back pain is very common. The pain often gets better over time. The cause of back pain is usually not dangerous. Most people can learn to manage their back pain on their own. Follow these instructions at home: Watch your back pain for any changes. The following actions may help to lessen any pain you are feeling:  Stay active. Start with short walks on flat ground if you can. Try to walk farther each day.  Exercise regularly as told by your doctor. Exercise helps your back heal faster. It also helps avoid future injury by keeping your muscles strong and flexible.  Do not sit, drive, or stand in one place for more than 30 minutes.  Do not stay in bed. Resting more than 1-2 days can slow down your recovery.  Be careful when you bend or lift an object. Use good form when lifting: ? Bend at your knees. ? Keep the object close to your body. ? Do not twist.  Sleep on a firm mattress. Lie on your side, and bend your knees. If you lie on your back, put a pillow under your knees.  Take medicines only as told by your doctor.  Put ice on the injured area. ? Put ice in a plastic bag. ? Place a towel between your skin and the bag. ? Leave the ice on for 20 minutes, 2-3 times a day for the first 2-3 days. After that, you can switch between ice and heat packs.  Avoid feeling anxious or stressed. Find good ways to deal with stress, such as exercise.  Maintain a healthy weight. Extra weight puts stress on your back.  Contact a doctor if:  You have pain that does not go away  with rest or medicine.  You have worsening pain that goes down into your legs or buttocks.  You have pain that does not get better in one week.  You have pain at night.  You lose weight.  You have a fever or chills. Get help right away if:  You cannot control when you poop (bowel movement) or pee (urinate).  Your arms or legs feel weak.  Your arms or legs lose feeling (numbness).  You feel sick to your stomach (nauseous) or throw up (vomit).  You have belly (abdominal) pain.  You feel like you may pass out (faint). This information is not intended to replace advice given to you by your health care provider. Make sure you discuss any questions you have with your health care provider. Document Released: 11/08/2007 Document Revised: 10/28/2015 Document Reviewed: 09/23/2013 Elsevier Interactive Patient Education  [employer].

## 2017-07-16 NOTE — Progress Notes (Signed)
Subjective: CC: Discharge f/u PCP: Sharion Balloon, FNP MSX:[id number] K Chauca is a 57 y.o. female presenting to clinic today for:  1. Hospital d/c follow up for chest pain/ Chronic back pain Patient was hospitalized for ACS workup.  On presentation to the emergency department she had diffuse ST-T wave changes on EKG.  Troponins were cycled and were negative x3.  She had an inpatient stress test which was considered to be low risk.  Ejection fraction was within normal limits.  CTA was also negative for pulmonary embolism.  She was discharged in stable condition with recommendations to follow with PCP in 1 week and to follow-up with pulmonology as scheduled.  She has since seen pulmonology who did confirm COPD mixed type, gold 3.  Her pulmonologist has placed her on Dulera.  Dr. Melvyn Novas recommended 6-week trial off of ACE inhibitor to rule this out as a potential cause for her chronic cough.  She has been placed on Benicar 20 mg daily.  She will follow-up in 6 weeks with him for full pulmonary function testing.  She reports that since discharge from hospital, she has had no recurrent chest pains.  She does continue to have thoracic back pain, which she has had since 6 months ago.  She reports that her job requires frequent lifting and upper extremity movements.  Denies preceding injury.  She notes that the back pain has gradually gotten worse over the last 6 months.  Denies upper extremity weakness, numbness or tingling.  Meloxicam did not help with back pain.  She notes that she had some leftover oxycodone from his surgery, which she has been taking intermittently for severe pain.  She does note good relief with this medication.   ROS: Per HPI  Allergies  Allergen Reactions  . Lipitor [Atorvastatin] Other (See Comments)    Muscle aches and nausea  . Naproxen     Stomach pain   Past Medical History:  Diagnosis Date  . Depression   . 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 GERD  (gastroesophageal reflux disease)   . HTN (hypertension)   . Neuropathy     Current Outpatient Medications:  .  carvedilol (COREG) 6.25 MG tablet, TAKE 1 TABLET BY MOUTH TWICE DAILY WITH  A  MEAL, Disp: 180 tablet, Rfl: 0 .  citalopram (CELEXA) 20 MG tablet, TAKE ONE TABLET BY MOUTH ONCE DAILY, Disp: 90 tablet, Rfl: 1 .  gabapentin (NEURONTIN) 800 MG tablet, TAKE 1 TABLET BY MOUTH THREE TIMES DAILY, Disp: 90 tablet, Rfl: 2 .  hydrochlorothiazide (HYDRODIURIL) 25 MG tablet, TAKE 1 TABLET BY MOUTH ONCE DAILY, Disp: 90 tablet, Rfl: 0 .  meloxicam (MOBIC) 15 MG tablet, TAKE 1 TABLET BY MOUTH ONCE DAILY (Patient not taking: Reported on 07/12/2017), Disp: 30 tablet, Rfl: 0 .  mometasone-formoterol (DULERA) 200-5 MCG/ACT AERO, Inhale 2 puffs into the lungs 2 (two) times daily., Disp: 1 Inhaler, Rfl: 0 .  nitroGLYCERIN (NITROSTAT) 0.4 MG SL tablet, Place 1 tablet (0.4 mg total) under the tongue every 5 (five) minutes as needed., Disp: 25 tablet, Rfl: 3 .  olmesartan (BENICAR) 20 MG tablet, Take 1 tablet (20 mg total) by mouth daily., Disp: 30 tablet, Rfl: 11 .  omeprazole (PRILOSEC OTC) 20 MG tablet, Take 20 mg by mouth daily., Disp: , Rfl:  Social History   Socioeconomic History  . Marital status: Single    Spouse name: Not on file  . Number of children: Not on file  . Years of  education: Not on file  . Highest education level: Not on file  Social Needs  . Financial resource strain: Not on file  . Food insecurity - worry: Not on file  . Food insecurity - inability: Not on file  . Transportation needs - medical: Not on file  . Transportation needs - non-medical: Not on file  Occupational History  . Not on file  Tobacco Use  . Smoking status: Current Every Day Smoker    Packs/day: 0.50    Years: 30.00    Pack years: 15.00    Types: Cigarettes  . Smokeless tobacco: Never Used  . Tobacco comment: pt quit smoking 2016 for 7 months but restarted last year  Substance and Sexual Activity  .  Alcohol use: No    Alcohol/week: 0.0 oz  . Drug use: Yes    Frequency: 0.5 times per week    Types: Marijuana  . Sexual activity: Yes    Birth control/protection: None, Post-menopausal  Other Topics Concern  . Not on file  Social History Narrative   The patient does not have any sex drive and has not had one for almost a year.   Her   husband thinks she does not love him anymore.  He does not seem to understand   her   depression.   Family History  Problem Relation Age of Onset  . Coronary artery disease Father        States congenital abnormality  . Diabetes Father   . Hyperlipidemia Father   . CAD Sister        Stents   . Cancer Sister        breast  . Cancer Mother        breast  . Cancer Maternal Aunt        breast  . Cancer Maternal Grandmother        breast  . Anesthesia problems Neg Hx   . Hypotension Neg Hx   . Malignant hyperthermia Neg Hx   . Pseudochol deficiency Neg Hx     Objective: Office vital signs reviewed. BP 111/77   Pulse 96   Temp (!) 97.5 F (36.4 C) (Oral)   Ht 5' 5"  (1.651 m)   Wt 204 lb (92.5 kg)   LMP 05/25/2011   BMI 33.95 kg/m   Physical Examination:  General: Awake, alert, obese, No acute distress HEENT: no JVD Cardio: regular rate and rhythm, S1S2 heard, no murmurs appreciated Pulm: clear to auscultation bilaterally, no wheezes, rhonchi or rales; normal work of breathing on room air Extremities: warm, well perfused, No edema, cyanosis or clubbing; +2 pulses bilaterally MSK: normal gait and normal station  Thoracic spine: Patient has full active range of motion in all planes.  However, she does have pain with flexion, extension and rotation to the left.  No midline tenderness to palpation.  No paraspinal muscle tenderness to palpation.  Lumbar spine: Patient has full active range of motion in all planes.  She does have pain with flexion, extension and rotation to the left.  No midline tenderness to palpation.  No paraspinal muscle  tenderness to palpation.  She does have a mildly positive straight leg raise on the right. Skin: dry; intact; no rashes or lesions Neuro: 5/5 UE and LE Strength; UE and LE light touch sensation grossly intact  Assessment/ Plan: 57 y.o. female   1. Chronic bilateral thoracic back pain I did offer imaging and referral to physical therapy.  Patient does wish to see  an orthopedist.  She actually was establish with [facility] her shoulder previously.  I have referred her back to her orthopedist for further evaluation.  For now supportive care with oral analgesics PRN.  Red flag symptoms discussed.  Patient voiced good understanding will follow-up as needed. - Ambulatory referral to Orthopedic Surgery  2. Hospital discharge follow-up Doing well post discharge.  She has seen her pulmonologist.  She has appointment scheduled with cardiology and pulmonology in March.  We will recheck renal function and CBC as per their recommendations on the discharge summary. - CBC with Differential - CMP14+EGFR  3. Chest pain, unspecified type Resolved. - CBC with Differential - CMP14+EGFR  4. COPD  GOLD III Followed by Dr. Melvyn Novas. - CBC with Differential - CMP14+EGFR  5. Elevated liver function tests We will recheck liver function test today. - CBC with Differential - CMP14+EGFR   Orders Placed This Encounter  Procedures  . CBC with Differential  . CMP14+EGFR  . Ambulatory referral to Orthopedic Surgery    Referral Priority:   Routine    Referral Type:   Surgical    Referral Reason:   Specialty Services Required    Requested Specialty:   Orthopedic Surgery    Number of Visits Requested:   [place], [place] [phone]

## 2017-07-17 LAB — CMP14+EGFR
ALT: 35 IU/L — ABNORMAL HIGH (ref 0–32)
AST: 39 IU/L (ref 0–40)
Albumin/Globulin Ratio: 1.4 (ref 1.2–2.2)
Albumin: 4.1 g/dL (ref 3.5–5.5)
Alkaline Phosphatase: 91 IU/L (ref 39–117)
BUN / CREAT RATIO: 11 (ref 9–23)
BUN: 10 mg/dL (ref 6–24)
Bilirubin Total: 0.2 mg/dL (ref 0.0–1.2)
CO2: 26 mmol/L (ref 20–29)
Calcium: 9.6 mg/dL (ref 8.7–10.2)
Chloride: 98 mmol/L (ref 96–106)
Creatinine, Ser: 0.94 mg/dL (ref 0.57–1.00)
GFR calc Af Amer: 78 mL/min/{1.73_m2} (ref >59)
GFR, EST NON AFRICAN AMERICAN: 68 mL/min/{1.73_m2} (ref >59)
GLOBULIN, TOTAL: 3 g/dL (ref 1.5–4.5)
Glucose: 98 mg/dL (ref 65–99)
Potassium: 3.7 mmol/L (ref 3.5–5.2)
SODIUM: 142 mmol/L (ref 134–144)
TOTAL PROTEIN: 7.1 g/dL (ref 6.0–8.5)

## 2017-07-17 LAB — CBC WITH DIFFERENTIAL/PLATELET
BASOS: 1 %
Basophils Absolute: 0.1 10*3/uL (ref 0.0–0.2)
EOS (ABSOLUTE): 0.1 10*3/uL (ref 0.0–0.4)
Eos: 1 %
Hematocrit: 44.2 % (ref 34.0–46.6)
Hemoglobin: 14.7 g/dL (ref 11.1–15.9)
Immature Grans (Abs): 0 10*3/uL (ref 0.0–0.1)
Immature Granulocytes: 0 %
Lymphocytes Absolute: 4.1 10*3/uL — ABNORMAL HIGH (ref 0.7–3.1)
Lymphs: 35 %
MCH: 29.9 pg (ref 26.6–33.0)
MCHC: 33.3 g/dL (ref 31.5–35.7)
MCV: 90 fL (ref 79–97)
MONOS ABS: 0.9 10*3/uL (ref 0.1–0.9)
Monocytes: 8 %
NEUTROS ABS: 6.6 10*3/uL (ref 1.4–7.0)
Neutrophils: 55 %
PLATELETS: 317 10*3/uL (ref 150–379)
RBC: 4.92 x10E6/uL (ref 3.77–5.28)
RDW: 13.7 % (ref 12.3–15.4)
WBC: 11.8 10*3/uL — ABNORMAL HIGH (ref 3.4–10.8)

## 2017-07-26 ENCOUNTER — Ambulatory Visit (INDEPENDENT_AMBULATORY_CARE_PROVIDER_SITE_OTHER): Payer: Self-pay

## 2017-07-26 ENCOUNTER — Encounter (INDEPENDENT_AMBULATORY_CARE_PROVIDER_SITE_OTHER): Payer: Self-pay | Admitting: Orthopaedic Surgery

## 2017-07-26 ENCOUNTER — Ambulatory Visit (INDEPENDENT_AMBULATORY_CARE_PROVIDER_SITE_OTHER): Payer: BLUE CROSS/BLUE SHIELD | Admitting: Orthopaedic Surgery

## 2017-07-26 VITALS — BP 118/78 | HR 95 | Ht 65.0 in | Wt 204.0 lb

## 2017-07-26 DIAGNOSIS — M542 Cervicalgia: Secondary | ICD-10-CM | POA: Diagnosis not present

## 2017-07-26 DIAGNOSIS — M545 Low back pain: Secondary | ICD-10-CM | POA: Diagnosis not present

## 2017-07-26 DIAGNOSIS — G8929 Other chronic pain: Secondary | ICD-10-CM | POA: Diagnosis not present

## 2017-07-26 NOTE — Progress Notes (Signed)
Office Visit Note/orthopedic consultation   Patient: Jamie Mcgee           Date of Birth: 17-Sep-1960           MRN: [id number] Visit Date: 07/26/2017              Requested by: [name], DO [address] [place], [place] [address] PCP: [name], DO   Assessment & Plan: Visit Diagnoses:  1. Neck pain   2. Chronic bilateral low back pain, with sciatica presence unspecified     Plan: Set up for some physical therapy evaluation and treatment for cervical spondylosis with referred pain in thoracic lumbar region.  Recheck in 5 weeks.  If she has persistent symptoms I recommend proceeding with diagnostic imaging of the cervical spine to evaluate her spondylosis particularly at C5-6.  Recheck after physical therapy And I appreciate the opportunity to see her in consultation. Follow-Up Instructions: No Follow-up on file.   Orders:  Orders Placed This Encounter  Procedures  . XR Cervical Spine 2 or 3 views  . XR Lumbar Spine 2-3 Views   No orders of the defined types were placed in this encounter.     Procedures: No procedures performed   Clinical Data: No additional findings.   Subjective: Chief Complaint  Patient presents with  . Middle Back - Pain  . Lower Back - Pain    HPI 57 year old female with several year history of pain from cervical region radiating down to her lumbar incision here for consultation by Dr Adam Phenix.  She is had pain with rotation of her neck worse pain on the right than left.  She has been on gabapentin 2400 milligrams daily without relief.  She has had anti-inflammatories in the past also has taken a few oxycodone.  She is used lidocaine patches Tylenol.  His bowel bladder symptoms no claudication symptoms.  Review of Systems review of systems positive for osteopenia continue cigarette smoker, acid reflux, bronchitis, hepatitis A, hypertension, migraines, scarlet fever.  Previous gallbladder surgery 2008 breast biopsy  1997 appendectomy around 1993 previous tubal ligation.  Patient smokes 1 pack/day.  Otherwise negative as it pertains HPI.   Objective: Vital Signs: BP 118/78   Pulse 95   Ht 5\' 5"  (1.651 m)   Wt 204 lb (92.5 kg)   LMP 05/25/2011   BMI 33.95 kg/m   Physical Exam  Constitutional: She is oriented to person, place, and time. She appears well-developed.  HENT:  Head: Normocephalic.  Right Ear: External ear normal.  Left Ear: External ear normal.  Eyes: Pupils are equal, round, and reactive to light.  Neck: No tracheal deviation present. No thyromegaly present.  Cardiovascular: Normal rate.  Pulmonary/Chest: Effort normal.  Abdominal: Soft.  Neurological: She is alert and oriented to person, place, and time.  Skin: Skin is warm and dry.  Psychiatric: She has a normal mood and affect. Her behavior is normal.    Ortho Exam the patient has bilateral brachial plexus tenderness worse on the right than the left upper extremity reflexes are 1+ and symmetrical no rash over exposed skin no lymphadenopathy.  She does have some prominence in the supra ocular fossa consistent with some emphysematous changes.  No significant scoliosis pelvis is level.  She has discomfort with lateral tilting and bending of her neck.  Lower extremities show no clonus.  Impingement of the shoulders.  No biceps triceps weakness.  Specialty Comments:  No specialty comments available.  Imaging:  No results found.   PMFS History: Patient Active Problem List   Diagnosis Date Noted  . COPD  GOLD III 07/12/2017  . Chest pain 07/03/2017  . Osteopenia 11/22/2016  . Pre-diabetes 12/25/2015  . Obesity (BMI 30-39.9) 12/25/2015  . Cramp of both lower extremities 07/02/2015  . GERD (gastroesophageal reflux disease) 05/07/2015  . Vitamin D deficiency 05/07/2015  . Metabolic syndrome 56/31/4970  . Post-menopausal bleeding 05/07/2015  . Cigarette smoker 03/08/2015  . Depressed 12/19/2013  . Essential hypertension,  benign 12/19/2013   Past Medical History:  Diagnosis Date  . Depression   . 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 GERD (gastroesophageal reflux disease)   . HTN (hypertension)   . Neuropathy     Family History  Problem Relation Age of Onset  . Coronary artery disease Father        States congenital abnormality  . Diabetes Father   . Hyperlipidemia Father   . CAD Sister        Stents   . Cancer Sister        breast  . Cancer Mother        breast  . Cancer Maternal Aunt        breast  . Cancer Maternal Grandmother        breast  . Anesthesia problems Neg Hx   . Hypotension Neg Hx   . Malignant hyperthermia Neg Hx   . Pseudochol deficiency Neg Hx     Past Surgical History:  Procedure Laterality Date  . APPENDECTOMY  1993  . BREAST BIOPSY    . CATARACT EXTRACTION  08-2010   left   . CATARACT EXTRACTION W/PHACO  06/01/2011   Procedure: CATARACT EXTRACTION PHACO AND INTRAOCULAR LENS PLACEMENT (I[facility]);  Surgeon: Tonny Branch;  Location: [facility];  Service: Ophthalmology;  Laterality: Right;  CDE:9.62  . CHOLECYSTECTOMY    . RENAL ARTERY ANGIOPLASTY  2006   right  . TUBAL LIGATION  1984   [facility]   Social History   Occupational History  . Not on file  Tobacco Use  . Smoking status: Current Every Day Smoker    Packs/day: 0.50    Years: 30.00    Pack years: 15.00    Types: Cigarettes  . Smokeless tobacco: Never Used  . Tobacco comment: pt quit smoking 2016 for 7 months but restarted last year  Substance and Sexual Activity  . Alcohol use: No    Alcohol/week: 0.0 oz  . Drug use: Yes    Frequency: 0.5 times per week    Types: Marijuana  . Sexual activity: Yes    Birth control/protection: None, Post-menopausal

## 2017-07-27 NOTE — Addendum Note (Signed)
Addended by: Meyer Cory on: 07/27/2017 03:57 PM   Modules accepted: Orders

## 2017-08-01 DIAGNOSIS — M47892 Other spondylosis, cervical region: Secondary | ICD-10-CM | POA: Diagnosis not present

## 2017-08-01 DIAGNOSIS — M545 Low back pain: Secondary | ICD-10-CM | POA: Diagnosis not present

## 2017-08-01 DIAGNOSIS — M546 Pain in thoracic spine: Secondary | ICD-10-CM | POA: Diagnosis not present

## 2017-08-03 DIAGNOSIS — M47892 Other spondylosis, cervical region: Secondary | ICD-10-CM | POA: Diagnosis not present

## 2017-08-03 DIAGNOSIS — M545 Low back pain: Secondary | ICD-10-CM | POA: Diagnosis not present

## 2017-08-03 DIAGNOSIS — M546 Pain in thoracic spine: Secondary | ICD-10-CM | POA: Diagnosis not present

## 2017-08-06 DIAGNOSIS — M546 Pain in thoracic spine: Secondary | ICD-10-CM | POA: Diagnosis not present

## 2017-08-06 DIAGNOSIS — M545 Low back pain: Secondary | ICD-10-CM | POA: Diagnosis not present

## 2017-08-06 DIAGNOSIS — M47892 Other spondylosis, cervical region: Secondary | ICD-10-CM | POA: Diagnosis not present

## 2017-08-08 ENCOUNTER — Telehealth (INDEPENDENT_AMBULATORY_CARE_PROVIDER_SITE_OTHER): Payer: Self-pay | Admitting: Orthopaedic Surgery

## 2017-08-08 ENCOUNTER — Ambulatory Visit (INDEPENDENT_AMBULATORY_CARE_PROVIDER_SITE_OTHER): Payer: BLUE CROSS/BLUE SHIELD | Admitting: Family Medicine

## 2017-08-08 ENCOUNTER — Encounter: Payer: Self-pay | Admitting: Family Medicine

## 2017-08-08 VITALS — BP 106/62 | HR 73 | Temp 98.1°F | Ht 65.0 in | Wt 205.0 lb

## 2017-08-08 DIAGNOSIS — M542 Cervicalgia: Secondary | ICD-10-CM | POA: Diagnosis not present

## 2017-08-08 MED ORDER — TRAMADOL HCL 50 MG PO TABS: 50.0000 mg | ORAL_TABLET | Freq: Two times a day (BID) | ORAL | 0 refills | Status: DC | PRN

## 2017-08-08 MED ORDER — METHYLPREDNISOLONE ACETATE 80 MG/ML IJ SUSP
80.0000 mg | Freq: Once | INTRAMUSCULAR | Status: AC
  Administered 2017-08-08: 80 mg via INTRAMUSCULAR

## 2017-08-08 NOTE — Patient Instructions (Signed)
Call Dr. Narda Amber office and let him know what is going on.  In the meantime, I have prescribed you tramadol to use twice a day as needed for severe pain.  As we discussed, this can cause sedation.  I do not want you working or driving while taking this medicine.

## 2017-08-08 NOTE — Progress Notes (Signed)
Subjective: CC: neck pain HPI: Jamie Mcgee is a 57 y.o. female presenting to clinic today for:  1. Neck pain Patient was seen by orthopedics on 07/26/2017 for neck pain.  She had imaging studies performed which noted spondylosis at C5-6.  She was referred to physical therapy and recommended that she return in 5 weeks for reevaluation.  She notes that she is undergone 3 physical therapy sessions and feels like pain is getting worse.  She reports muscle spasm and irritation particularly along the right side of her neck.  She reports radiation down to the right side of her collarbone.  She reports chronic and stable bilateral hand numbness and tingling that is intermittent.  No weakness.  She reports that pain seems to be exacerbated by overhead activities and moving her head side to side.  She has been using the Neurontin as prescribed.  She has been taking 3 extra strength Tylenol at a time in hopes to relieve pain.  She is used her Flexeril with no improvement.  She is wanting to know what she can do for symptoms.  She has not contacted her orthopedics with regards to this.  No hx seizure or opioid dependence.  ROS: Per HPI  Past Medical History:  Diagnosis Date  . Depression   . 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 GERD (gastroesophageal reflux disease)   . HTN (hypertension)   . Neuropathy    Allergies  Allergen Reactions  . Lipitor [Atorvastatin] Other (See Comments)    Muscle aches and nausea  . Naproxen     Stomach pain    Current Outpatient Medications:  .  carvedilol (COREG) 6.25 MG tablet, TAKE 1 TABLET BY MOUTH TWICE DAILY WITH  A  MEAL, Disp: 180 tablet, Rfl: 0 .  citalopram (CELEXA) 20 MG tablet, TAKE ONE TABLET BY MOUTH ONCE DAILY, Disp: 90 tablet, Rfl: 1 .  gabapentin (NEURONTIN) 800 MG tablet, TAKE 1 TABLET BY MOUTH THREE TIMES DAILY, Disp: 90 tablet, Rfl: 2 .  hydrochlorothiazide (HYDRODIURIL) 25 MG tablet, TAKE 1 TABLET BY MOUTH ONCE DAILY,  Disp: 90 tablet, Rfl: 0 .  mometasone-formoterol (DULERA) 200-5 MCG/ACT AERO, Inhale 2 puffs into the lungs 2 (two) times daily., Disp: 1 Inhaler, Rfl: 0 .  nitroGLYCERIN (NITROSTAT) 0.4 MG SL tablet, Place 1 tablet (0.4 mg total) under the tongue every 5 (five) minutes as needed., Disp: 25 tablet, Rfl: 3 .  olmesartan (BENICAR) 20 MG tablet, Take 1 tablet (20 mg total) by mouth daily., Disp: 30 tablet, Rfl: 11 .  omeprazole (PRILOSEC OTC) 20 MG tablet, Take 20 mg by mouth daily., Disp: , Rfl:  .  traMADol (ULTRAM) 50 MG tablet, Take 1 tablet (50 mg total) by mouth every 12 (twelve) hours as needed for severe pain., Disp: 14 tablet, Rfl: 0  Current Facility-Administered Medications:  .  methylPREDNISolone acetate (DEPO-MEDROL) injection 80 mg, 80 mg, Intramuscular, Once, [name], DO Social History   Socioeconomic History  . Marital status: Single    Spouse name: Not on file  . Number of children: Not on file  . Years of education: Not on file  . Highest education level: Not on file  Social Needs  . Financial resource strain: Not on file  . Food insecurity - worry: Not on file  . Food insecurity - inability: Not on file  . Transportation needs - medical: Not on file  . Transportation needs - non-medical: Not on file  Occupational History  .  Not on file  Tobacco Use  . Smoking status: Current Every Day Smoker    Packs/day: 0.50    Years: 30.00    Pack years: 15.00    Types: Cigarettes  . Smokeless tobacco: Never Used  . Tobacco comment: pt quit smoking 2016 for 7 months but restarted last year  Substance and Sexual Activity  . Alcohol use: No    Alcohol/week: 0.0 oz  . Drug use: Yes    Frequency: 0.5 times per week    Types: Marijuana  . Sexual activity: Yes    Birth control/protection: None, Post-menopausal  Other Topics Concern  . Not on file  Social History Narrative   The patient does not have any sex drive and has not had one for almost a year.   Her    husband thinks she does not love him anymore.  He does not seem to understand   her   depression.   Family History  Problem Relation Age of Onset  . Coronary artery disease Father        States congenital abnormality  . Diabetes Father   . Hyperlipidemia Father   . CAD Sister        Stents   . Cancer Sister        breast  . Cancer Mother        breast  . Cancer Maternal Aunt        breast  . Cancer Maternal Grandmother        breast  . Anesthesia problems Neg Hx   . Hypotension Neg Hx   . Malignant hyperthermia Neg Hx   . Pseudochol deficiency Neg Hx    Objective: Office vital signs reviewed. BP 106/62   Pulse 73   Temp 98.1 F (36.7 C) (Oral)   Ht 5\' 5"  (1.651 m)   Wt 205 lb (93 kg)   LMP 05/25/2011   BMI 34.11 kg/m   Physical Examination:  General: Awake, alert, appears uncomfortable. MSK:   Cervical spine: Patient has limited active range of motion in flexion, extension and rotation of the cervical spine secondary to pain.  Upon visual inspection she does seem to have increased tonicity along the trapezius on the right side.  She is tender upon light palpation to the entire neck posteriorly and laterally.  She has pain with Spurling's bilaterally but no radiculopathy.  Upper extremities: 5/5 upper extremity strength bilaterally.  She has a loss of about 20 degrees in abduction of bilateral upper extremities.  She is unable to internally rotate shoulders bilaterally secondary to pain in her neck. Neuro: Light touch sensation grossly intact.  Assessment/ Plan: 57 y.o. female   1. Neck pain Patient with substantial neck pain, likely exacerbated by physical therapy.  She is currently on Neurontin which is not controlling neck pain.  There is degenerative changes appreciated on her recent x-rays of the neck at C5 and 6, which orthopedics is managing.  She was given a dose of Depo-Medrol here in office and prescribed tramadol to use twice daily as needed severe pain.  The  Narcotic Database has been reviewed.  There were no red flags.  We discussed that this medication would not be a long-term medication and would be simply to get her through physical therapy until she is able to see her orthopedics again.  She will likely need an MRI of the neck.  I question if she would be eligible for injections versus surgery given severity of symptoms.  She  has been written out of work through Monday.  A 10 pound weight restriction.  Strict return precautions and reasons for emergent evaluation in the emergency department review with patient.  They voiced understanding and will follow-up as needed. - methylPREDNISolone acetate (DEPO-MEDROL) injection 80 mg  Meds ordered this encounter  Medications  . methylPREDNISolone acetate (DEPO-MEDROL) injection 80 mg  . traMADol (ULTRAM) 50 MG tablet    Sig: Take 1 tablet (50 mg total) by mouth every 12 (twelve) hours as needed for severe pain.    Dispense:  14 tablet    Refill:  [place], DO [place] [phone]

## 2017-08-08 NOTE — Telephone Encounter (Signed)
Patient called wanting to let Dr. Lorin Mercy know that between PT and work she is experiencing a lot more pain, and would like to speak with you. CB # [phone]

## 2017-08-09 NOTE — Telephone Encounter (Signed)
Patient wants to proceed with MRI neck. OK to order?

## 2017-08-09 NOTE — Telephone Encounter (Signed)
Okay to see if insurance will approve MRI for C5-6 spondylosis with increased pain.  Office follow-up after scan.

## 2017-08-10 NOTE — Addendum Note (Signed)
Addended by: Meyer Cory on: 08/10/2017 10:32 AM   Modules accepted: Orders

## 2017-08-10 NOTE — Telephone Encounter (Signed)
Order entered. I called patient and advised. 

## 2017-08-16 DIAGNOSIS — M79672 Pain in left foot: Secondary | ICD-10-CM | POA: Diagnosis not present

## 2017-08-16 DIAGNOSIS — M722 Plantar fascial fibromatosis: Secondary | ICD-10-CM | POA: Diagnosis not present

## 2017-08-16 DIAGNOSIS — M79671 Pain in right foot: Secondary | ICD-10-CM | POA: Diagnosis not present

## 2017-08-17 ENCOUNTER — Ambulatory Visit: Payer: BLUE CROSS/BLUE SHIELD | Admitting: Cardiovascular Disease

## 2017-08-20 ENCOUNTER — Other Ambulatory Visit: Payer: Self-pay | Admitting: Family Medicine

## 2017-08-20 ENCOUNTER — Ambulatory Visit (HOSPITAL_COMMUNITY)
Admission: RE | Admit: 2017-08-20 | Discharge: 2017-08-20 | Disposition: A | Discharge status: Home / Self Care | Payer: BLUE CROSS/BLUE SHIELD | Source: Ambulatory Visit | Attending: Orthopaedic Surgery | Admitting: Orthopaedic Surgery

## 2017-08-20 DIAGNOSIS — M4682 Other specified inflammatory spondylopathies, cervical region: Secondary | ICD-10-CM | POA: Insufficient documentation

## 2017-08-20 DIAGNOSIS — M542 Cervicalgia: Secondary | ICD-10-CM | POA: Diagnosis not present

## 2017-08-20 DIAGNOSIS — Z1231 Encounter for screening mammogram for malignant neoplasm of breast: Secondary | ICD-10-CM

## 2017-08-20 DIAGNOSIS — M2578 Osteophyte, vertebrae: Secondary | ICD-10-CM | POA: Diagnosis not present

## 2017-08-23 ENCOUNTER — Ambulatory Visit (INDEPENDENT_AMBULATORY_CARE_PROVIDER_SITE_OTHER): Payer: BLUE CROSS/BLUE SHIELD | Admitting: Orthopaedic Surgery

## 2017-08-23 ENCOUNTER — Encounter (INDEPENDENT_AMBULATORY_CARE_PROVIDER_SITE_OTHER): Payer: Self-pay | Admitting: Orthopaedic Surgery

## 2017-08-23 VITALS — BP 115/70 | HR 67 | Ht 64.0 in | Wt 205.0 lb

## 2017-08-23 DIAGNOSIS — M9981 Other biomechanical lesions of cervical region: Secondary | ICD-10-CM | POA: Diagnosis not present

## 2017-08-23 DIAGNOSIS — M4722 Other spondylosis with radiculopathy, cervical region: Secondary | ICD-10-CM | POA: Diagnosis not present

## 2017-08-23 DIAGNOSIS — R200 Anesthesia of skin: Secondary | ICD-10-CM | POA: Diagnosis not present

## 2017-08-23 DIAGNOSIS — M4802 Spinal stenosis, cervical region: Secondary | ICD-10-CM | POA: Insufficient documentation

## 2017-08-23 NOTE — Addendum Note (Signed)
Addended by: Meyer Cory on: 08/23/2017 10:32 AM   Modules accepted: Orders

## 2017-08-23 NOTE — Progress Notes (Signed)
Office Visit Note   Patient: Jamie Mcgee           Date of Birth: Jun 22, 1960           MRN: [id number] Visit Date: 08/23/2017              Requested by: [name], DO [address] [place], [place] [address] PCP: [name], DO   Assessment & Plan: Visit Diagnoses:  1. Other spondylosis with radiculopathy, cervical region   2. Foraminal stenosis of cervical region   3. Bilateral hand numbness     Plan: We reviewed the MRI scan I gave her a copy of the report.  Her most significant level is C5-6 where she has cord flattening and by foraminal stenosis.  She may have some carpal tunnel syndrome in addition to her cervical disease and I recommend EMGs nerve conduction velocities I will see her back afterwards and we can discuss surgical intervention.  She is failed conservative treatment to this point.  She wants to talk with Dr. Lajuana Ripple about smoking sensation possibly some Chantix.  We will up after EMGs nerve conduction velocities.  We discussed double crush syndrome and nerve conduction velocities with EMGs will help [place] better outlined treatment plan.  Follow-Up Instructions: No follow-ups on file.   Orders:  No orders of the defined types were placed in this encounter.  No orders of the defined types were placed in this encounter.     Procedures: No procedures performed   Clinical Data: No additional findings.   Subjective: Chief Complaint  Patient presents with  . Neck - Pain, Follow-up    MRI review    HPI 57 year old female returns with ongoing significant severe neck pain.  She has had a prednisone pack has taken Neurontin up to 2400 mg/day also diclofenac.  She has neck pain that radiates into her shoulders worse down the right hand into the C6 distribution than left.  She wakes up at night has to shake her hands and go back to sleep she has numbness in her hands when she drives.  Sharp pain when she rotates her neck.  Been through physical  therapy and she states therapy actually made the pain worse.  Review of Systems systems updated from 07/26/1998 1914 point is negative other than as mentioned in HPI.  Does have neuropathy and also some problems with bilateral plantar fasciitis.  Continuing to work.   Objective: Vital Signs: BP 115/70   Pulse 67   Ht 5\' 4"  (1.626 m)   Wt 205 lb (93 kg)   LMP 05/25/2011   BMI 35.19 kg/m   Physical Exam  Constitutional: She is oriented to person, place, and time. She appears well-developed.  HENT:  Head: Normocephalic.  Right Ear: External ear normal.  Left Ear: External ear normal.  Eyes: Pupils are equal, round, and reactive to light.  Neck: No tracheal deviation present. No thyromegaly present.  Cardiovascular: Normal rate.  Pulmonary/Chest: Effort normal.  Abdominal: Soft.  Neurological: She is alert and oriented to person, place, and time.  Skin: Skin is warm and dry.  Psychiatric: She has a normal mood and affect. Her behavior is normal.    Ortho Exam severe brachial plexus tenderness worse on the right than left.  Positive Spurling right and left negative or meet.  She does have some scoliosis pelvis is level.  Upper extremity reflexes are 2+.  No thenar atrophy.  Negative Phalen's test.  Mild discomfort with carpal  compression worse on the right than left.  She has some impingement of the shoulders worse on the right than left long head of the biceps is normal.  Pain with resisted supraspinatus testing but negative drop arm test.  She can get both arms overhead easily.  Normal heel toe gait.  No plantar foot lesion she is tender over the plantar fascial origin.  Specialty Comments:  No specialty comments available.  Imaging:CLINICAL DATA:  Neck pain and right arm pain for 2 months.  EXAM: MRI CERVICAL SPINE WITHOUT CONTRAST  TECHNIQUE: Multiplanar, multisequence MR imaging of the cervical spine was performed. No intravenous contrast was administered.  COMPARISON:   None.  FINDINGS: Alignment: Minimal anterolisthesis of C3 on C4 and of C4 on C5.  Vertebrae: No fracture, evidence of discitis, or bone lesion.  Cord: Normal signal and morphology.  Posterior Fossa, vertebral arteries, paraspinal tissues: Negative.  Disc levels:  C2-3: Disc.  Moderate left facet arthritis.  Foraminal stenosis.  C3-4: Moderate right and mild left facet arthritis. 1 mm spondylolisthesis. Disc osteophyte complex extends into the right lateral recess and right neural foramen.  C4-5: Moderate right and mild left facet arthritis. 1 mm spondylolisthesis. Osteophytes extend into the right lateral recess. No foraminal stenosis.  C5-6: Disc space narrowing. Broad-based disc osteophyte complex asymmetric to the right extending into both neural foramina and both lateral recesses, right greater than left with moderate moderately severe right foraminal stenosis. No facet arthritis. Slight narrowing of the AP dimension of the spinal canal with slight indentation upon the ventral aspect of the spinal cord without myelopathy.  C6-7: Disc space narrowing. Broad-based disc osteophyte complex slightly asymmetric to the right but without significant encroachment upon the lateral recesses or neural foramina. No facet arthritis.  C7-T1: Normal disc.  Mild right facet arthritis.  IMPRESSION: 1. Osteophytes extend into the right lateral recesses and right neural foramina at C3-4 through C6-7, most prominent at C5-6. 2. Osteophytes extend into the left lateral recess and left neural foramen at C5-6. 3. Disc osteophyte complex minimally indents the ventral aspect of the spinal cord at C5-6 without myelopathy. 4. Moderate right facet arthritis at C3-4 and C4-5.   Electronically Signed   By: Lorriane Shire M.D.   On: 08/20/2017 16:59    PMFS History: Patient Active Problem List   Diagnosis Date Noted  . COPD  GOLD III 07/12/2017  . Chest pain 07/03/2017  .  Osteopenia 11/22/2016  . Pre-diabetes 12/25/2015  . Obesity (BMI 30-39.9) 12/25/2015  . Cramp of both lower extremities 07/02/2015  . GERD (gastroesophageal reflux disease) 05/07/2015  . Vitamin D deficiency 05/07/2015  . Metabolic syndrome 20/94/7096  . Post-menopausal bleeding 05/07/2015  . Cigarette smoker 03/08/2015  . Depressed 12/19/2013  . Essential hypertension, benign 12/19/2013   Past Medical History:  Diagnosis Date  . Depression   . 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 GERD (gastroesophageal reflux disease)   . HTN (hypertension)   . Neuropathy     Family History  Problem Relation Age of Onset  . Coronary artery disease Father        States congenital abnormality  . Diabetes Father   . Hyperlipidemia Father   . CAD Sister        Stents   . Cancer Sister        breast  . Cancer Mother        breast  . Cancer Maternal Aunt        breast  .  Cancer Maternal Grandmother        breast  . Anesthesia problems Neg Hx   . Hypotension Neg Hx   . Malignant hyperthermia Neg Hx   . Pseudochol deficiency Neg Hx     Past Surgical History:  Procedure Laterality Date  . APPENDECTOMY  1993  . BREAST BIOPSY    . CATARACT EXTRACTION  08-2010   left   . CATARACT EXTRACTION W/PHACO  06/01/2011   Procedure: CATARACT EXTRACTION PHACO AND INTRAOCULAR LENS PLACEMENT (IOC);  Surgeon: Tonny Branch;  Location: [facility];  Service: Ophthalmology;  Laterality: Right;  CDE:9.62  . CHOLECYSTECTOMY    . RENAL ARTERY ANGIOPLASTY  2006   right  . TUBAL LIGATION  1984   [facility]   Social History   Occupational History  . Not on file  Tobacco Use  . Smoking status: Current Every Day Smoker    Packs/day: 0.50    Years: 30.00    Pack years: 15.00    Types: Cigarettes  . Smokeless tobacco: Never Used  . Tobacco comment: pt quit smoking 2016 for 7 months but restarted last year  Substance and Sexual Activity  . Alcohol use: No    Alcohol/week: 0.0 oz  . Drug use: Yes     Frequency: 0.5 times per week    Types: Marijuana  . Sexual activity: Yes    Birth control/protection: None, Post-menopausal

## 2017-08-29 ENCOUNTER — Ambulatory Visit: Payer: BLUE CROSS/BLUE SHIELD | Admitting: Internal Medicine

## 2017-08-30 ENCOUNTER — Ambulatory Visit (INDEPENDENT_AMBULATORY_CARE_PROVIDER_SITE_OTHER): Payer: BLUE CROSS/BLUE SHIELD | Admitting: Orthopaedic Surgery

## 2017-08-31 ENCOUNTER — Telehealth (INDEPENDENT_AMBULATORY_CARE_PROVIDER_SITE_OTHER): Payer: Self-pay | Admitting: Radiology

## 2017-08-31 ENCOUNTER — Telehealth (INDEPENDENT_AMBULATORY_CARE_PROVIDER_SITE_OTHER): Payer: Self-pay | Admitting: Orthopaedic Surgery

## 2017-08-31 ENCOUNTER — Encounter: Payer: Self-pay | Admitting: Neurology

## 2017-08-31 ENCOUNTER — Ambulatory Visit (HOSPITAL_COMMUNITY)
Admission: RE | Admit: 2017-08-31 | Discharge: 2017-08-31 | Disposition: A | Discharge status: Home / Self Care | Payer: BLUE CROSS/BLUE SHIELD | Source: Ambulatory Visit | Attending: Family Medicine | Admitting: Family Medicine

## 2017-08-31 DIAGNOSIS — Z1231 Encounter for screening mammogram for malignant neoplasm of breast: Secondary | ICD-10-CM | POA: Insufficient documentation

## 2017-08-31 NOTE — Telephone Encounter (Signed)
Patient requests referral for EMG/NCV be sent to another facility to see if they are able to get her done sooner.  Referral sent to [facility] to see if they are able to get her in sooner.

## 2017-08-31 NOTE — Addendum Note (Signed)
Addended by: Meyer Cory on: 08/31/2017 10:10 AM   Modules accepted: Orders

## 2017-08-31 NOTE — Telephone Encounter (Signed)
Please advise. OK to complete forms when they come in?  Patient states she cannot perform job duties and is going to apply for her short term disability through her employer until after test and follow up.

## 2017-08-31 NOTE — Telephone Encounter (Signed)
Ok to keep OOW times 4 wks.

## 2017-08-31 NOTE — Telephone Encounter (Signed)
Pt decided to come out of her.Pt stated her neck and back are hurting really bad cant perform work duties. Pt currently filing paper work for short-term disability. Pt will have nerve test on 09/27/27. Pt will fax disability paperwork over to the office. I did make pt aware of [employer]  process just in case its needed to be filed.

## 2017-09-01 ENCOUNTER — Other Ambulatory Visit: Payer: Self-pay | Admitting: Family

## 2017-09-03 ENCOUNTER — Other Ambulatory Visit: Payer: Self-pay | Admitting: *Deleted

## 2017-09-03 DIAGNOSIS — G5603 Carpal tunnel syndrome, bilateral upper limbs: Secondary | ICD-10-CM

## 2017-09-03 NOTE — Telephone Encounter (Signed)
Note entered into system. 

## 2017-09-06 DIAGNOSIS — M79671 Pain in right foot: Secondary | ICD-10-CM | POA: Diagnosis not present

## 2017-09-06 DIAGNOSIS — M79672 Pain in left foot: Secondary | ICD-10-CM | POA: Diagnosis not present

## 2017-09-06 DIAGNOSIS — M722 Plantar fascial fibromatosis: Secondary | ICD-10-CM | POA: Diagnosis not present

## 2017-09-11 ENCOUNTER — Ambulatory Visit (INDEPENDENT_AMBULATORY_CARE_PROVIDER_SITE_OTHER): Payer: BLUE CROSS/BLUE SHIELD | Admitting: Neurology

## 2017-09-11 DIAGNOSIS — G5603 Carpal tunnel syndrome, bilateral upper limbs: Secondary | ICD-10-CM

## 2017-09-11 NOTE — Progress Notes (Signed)
Has follow up appt scheduled for 4/11

## 2017-09-11 NOTE — Procedures (Signed)
[facility]  [place], [place]  [place], [place] [address] Tel: [phone] Fax:  [phone] Test Date:  09/11/2017  Patient: Jamie Mcgee DOB: [birth year] Physician: Narda Amber, DO  Sex: Female Height: 5\' 4"  Ref Phys: Rodell Perna, MD  ID#: [id number] Temp: 33.0C Technician:    Patient Complaints: This is a 57 year old female referred for evaluation of neck pain radiating into the arms and hand paresthesia.  NCV & EMG Findings: Extensive electrodiagnostic testing of the right upper extremity and additional studies of the left shows:  1. Right median sensory response shows prolonged latency (3.8 ms) and reduced amplitude (11.0 V). Left median sensory nerve showed prolonged distal peak latency (3.7 ms) and normal amplitude. Bilateral ulnar sensory responses are within normal limits. 2. Bilateral median and ulnar motor responses are within normal limits. 3. Chronic motor axon loss changes are seen affecting the biceps and deltoid muscles bilaterally, there is no evidence of a common in active denervation.  Impression: 1. Bilateral median neuropathy at or distal to the wrist, consistent with clinical diagnosis of carpal tunnel syndrome; these findings are moderate on the right and mild on the left. 2. Chronic C5 radiculopathy affecting bilateral upper extremities, mild in degree electrically.     ___________________________ Narda Amber, DO    Nerve Conduction Studies Anti Sensory Summary Table   Site NR Peak (ms) Norm Peak (ms) P-T Amp (V) Norm P-T Amp  Left Median Anti Sensory (2nd Digit)  33C  Wrist    3.7 <3.6 20.9 >15  Right Median Anti Sensory (2nd Digit)  33C  Wrist    3.8 <3.6 11.0 >15  Left Ulnar Anti Sensory (5th Digit)  33C  Wrist    2.9 <3.1 19.1 >10  Right Ulnar Anti Sensory (5th Digit)  33C  Wrist    2.6 <3.1 15.0 >10   Motor Summary Table   Site NR Onset (ms) Norm Onset (ms) O-P Amp (mV) Norm O-P Amp Site1 Site2 Delta-0 (ms) Dist  (cm) Vel (m/s) Norm Vel (m/s)  Left Median Motor (Abd Poll Brev)  33C  Wrist    3.6 <4.0 8.0 >6 Elbow Wrist 4.9 27.0 55 >50  Elbow    8.5  7.7         Right Median Motor (Abd Poll Brev)  33C  Wrist    3.5 <4.0 7.4 >6 Elbow Wrist 4.6 27.0 59 >50  Elbow    8.1  7.0         Left Ulnar Motor (Abd Dig Minimi)  33C  Wrist    2.5 <3.1 12.2 >7 B Elbow Wrist 4.1 23.0 56 >50  B Elbow    6.6  11.4  A Elbow B Elbow 1.9 10.0 53 >50  A Elbow    8.5  10.7         Right Ulnar Motor (Abd Dig Minimi)  33C  Wrist    2.3 <3.1 11.8 >7 B Elbow Wrist 3.6 21.0 58 >50  B Elbow    5.9  11.7  A Elbow B Elbow 2.0 10.0 50 >50  A Elbow    7.9  11.1          EMG   Side Muscle Ins Act Fibs Psw Fasc Number Recrt Dur Dur. Amp Amp. Poly Poly. Comment  Right 1stDorInt Nml Nml Nml Nml Nml Nml Nml Nml Nml Nml Nml Nml N/A  Right Abd Poll Brev Nml Nml Nml Nml Nml Nml Nml Nml Nml Nml Nml Nml N/A  Right PronatorTeres Nml Nml Nml Nml Nml Nml Nml Nml Nml Nml Nml Nml N/A  Right Biceps Nml Nml Nml Nml 1- Rapid Few 1+ Few 1+ Nml Nml N/A  Right Triceps Nml Nml Nml Nml Nml Nml Nml Nml Nml Nml Nml Nml N/A  Right Deltoid Nml Nml Nml Nml 1- Rapid Few 1+ Few 1+ Nml Nml N/A  Left 1stDorInt Nml Nml Nml Nml Nml Nml Nml Nml Nml Nml Nml Nml N/A  Left Abd Poll Brev Nml Nml Nml Nml Nml Nml Nml Nml Nml Nml Nml Nml N/A  Left PronatorTeres Nml Nml Nml Nml Nml Nml Nml Nml Nml Nml Nml Nml N/A  Left Biceps Nml Nml Nml Nml 1- Rapid Few 1+ Few 1+ Nml Nml N/A  Left Triceps Nml Nml Nml Nml Nml Nml Nml Nml Nml Nml Nml Nml N/A  Left Deltoid Nml Nml Nml Nml 1- Rapid Few 1+ Few 1+ Nml Nml N/A      Waveforms:

## 2017-09-13 ENCOUNTER — Ambulatory Visit (INDEPENDENT_AMBULATORY_CARE_PROVIDER_SITE_OTHER): Payer: BLUE CROSS/BLUE SHIELD | Admitting: Orthopaedic Surgery

## 2017-09-13 ENCOUNTER — Encounter (INDEPENDENT_AMBULATORY_CARE_PROVIDER_SITE_OTHER): Payer: Self-pay | Admitting: Orthopaedic Surgery

## 2017-09-13 VITALS — BP 120/71 | HR 81

## 2017-09-13 DIAGNOSIS — M4722 Other spondylosis with radiculopathy, cervical region: Secondary | ICD-10-CM

## 2017-09-13 DIAGNOSIS — G5603 Carpal tunnel syndrome, bilateral upper limbs: Secondary | ICD-10-CM

## 2017-09-16 ENCOUNTER — Encounter (INDEPENDENT_AMBULATORY_CARE_PROVIDER_SITE_OTHER): Payer: Self-pay | Admitting: Orthopaedic Surgery

## 2017-09-16 NOTE — Progress Notes (Signed)
Office Visit Note   Patient: Jamie Mcgee           Date of Birth: Oct 24, 1960           MRN: [id number] Visit Date: 09/13/2017              Requested by: [name], DO [address] [place], [place] [address] PCP: [name], DO   Assessment & Plan: Visit Diagnoses:  1. Bilateral carpal tunnel syndrome   2. Other spondylosis with radiculopathy, cervical region     Plan: Discussed nerve conduction velocities and gave her a copy of the report.  Cervical MRI once again.  Plan would be single level cervical fusion at C5-6 with allograft and plate right carpal tunnel release.  Discussed that she might have progression of her left carpal tunnel left carpal tunnel release as an outpatient at some point in the distant future possibly.  Discussed risks of surgery discussed including pseudoarthrosis, dysphasia, dysphonia or anesthetic problems.  Pseudoarthrosis.  We discussed smoking cessation.  Anesthetic risks were discussed.  She is been through physical therapy prednisone pack anti-inflammatories muscle relaxants, Neurontin.  Symptoms have been present for greater than 4 months with progression of symptoms.  Questions were elicited and answered she understands and requests we proceed with C5-6 and a level cervical fusion and right carpal tunnel release.  Follow-Up Instructions: No follow-ups on file.   Orders:  No orders of the defined types were placed in this encounter.  No orders of the defined types were placed in this encounter.     Procedures: No procedures performed   Clinical Data: No additional findings.   Subjective: Chief Complaint  Patient presents with  . Neck - Follow-up  . Right Wrist - Follow-up    EMG/NCV review  . Left Wrist - Follow-up    HPI 57 year old female returns  For of C5-6 spondylosis and symptomatic right carpal tunnel syndrome.  She is had ongoing severe neck pain not responsive to prednisone Dosepak, Neurontin up to 2400  mg/day, anti-inflammatories including Advil and also diclofenac.  Pain radiates into her neck and her right shoulder into her right hand with numbness in the C6 distribution.  Is had EMGs nerve conduction velocities done by Dr. Posey Pronto on 09/11/2017 Which Shows Moderate Carpal Tunl. on the right and mild on the left.  Having any left hand symptoms.  There was chronic C5 radiculopathy changes in both upper extremities.  08/23/2017 office visit we discussed C5-6 anterior cervical discectomy and fusion today she returns for review of nerve conduction velocities which are positive as mentioned above.  She has sharp pain with rotation of her neck.  Denies lower extremity weakness no problems with stairs.  Negative for fever or chills.  No bowel or bladder symptoms.  Review of Systems 14 point review of systems updated unchanged from 07/26/2017 office visit.  Of note he has hypertension, cigarette smoker, GERD, vitamin D deficiency with osteopenia, prediabetes cervical spondylosis at C5-6, carpal tunnel syndrome, COPD Gold 3.   Objective: Vital Signs: BP 120/71   Pulse 81   LMP 05/25/2011   Physical Exam  Constitutional: She is oriented to person, place, and time. She appears well-developed.  HENT:  Head: Normocephalic.  Right Ear: External ear normal.  Left Ear: External ear normal.  Eyes: Pupils are equal, round, and reactive to light.  Neck: No tracheal deviation present. No thyromegaly present.  Cardiovascular: Normal rate.  Pulmonary/Chest: Effort normal. No respiratory distress. She has  no wheezes. She has no rales.  Abdominal: Soft.  Neurological: She is alert and oriented to person, place, and time.  Skin: Skin is warm and dry.  Psychiatric: She has a normal mood and affect. Her behavior is normal.    Ortho Exam patient has bilateral brachial plexus tenderness worse on the right than left.  Positive Spurling on the right.  Left side is negative.Lhermitte test is negative.  Upper extremity  reflexes are 2+ and symmetrical.  She has discomfort with positive carpal compression test more on the right than left.  No thenar atrophy.  Mild discomfort with impingement test on the right negative drop arm test.  Can get her arms overhead easily.  No lower extremity hyperreflexia.  No quad atrophy.  Quads anterior tib gastrocsoleus is strong normal heel and toe walking.  No clonus.  Specialty Comments:  No specialty comments available.  Imaging:EXAM: MRI CERVICAL SPINE WITHOUT CONTRAST  TECHNIQUE: Multiplanar, multisequence MR imaging of the cervical spine was performed. No intravenous contrast was administered.  COMPARISON: None.  FINDINGS: Alignment: Minimal anterolisthesis of C3 on C4 and of C4 on C5.  Vertebrae: No fracture, evidence of discitis, or bone lesion.  Cord: Normal signal and morphology.  Posterior Fossa, vertebral arteries, paraspinal tissues: Negative.  Disc levels:  C2-3: Disc. Moderate left facet arthritis. Foraminal stenosis.  C3-4: Moderate right and mild left facet arthritis. 1 mm spondylolisthesis. Disc osteophyte complex extends into the right lateral recess and right neural foramen.  C4-5: Moderate right and mild left facet arthritis. 1 mm spondylolisthesis. Osteophytes extend into the right lateral recess. No foraminal stenosis.  C5-6: Disc space narrowing. Broad-based disc osteophyte complex asymmetric to the right extending into both neural foramina and both lateral recesses, right greater than left with moderate moderately severe right foraminal stenosis. No facet arthritis. Slight narrowing of the AP dimension of the spinal canal with slight indentation upon the ventral aspect of the spinal cord without myelopathy.  C6-7: Disc space narrowing. Broad-based disc osteophyte complex slightly asymmetric to the right but without significant encroachment upon the lateral recesses or neural foramina. No facet arthritis.  C7-T1:  Normal disc. Mild right facet arthritis.  IMPRESSION: 1. Osteophytes extend into the right lateral recesses and right neural foramina at C3-4 through C6-7, most prominent at C5-6. 2. Osteophytes extend into the left lateral recess and left neural foramen at C5-6. 3. Disc osteophyte complex minimally indents the ventral aspect of the spinal cord at C5-6 without myelopathy. 4. Moderate right facet arthritis at C3-4 and C4-5.   Electronically Signed By: Lorriane Shire M.D. On: 08/20/2017 16:59       PMFS History: Patient Active Problem List   Diagnosis Date Noted  . Other spondylosis with radiculopathy, cervical region 08/23/2017  . Foraminal stenosis of cervical region 08/23/2017  . COPD  GOLD III 07/12/2017  . Chest pain 07/03/2017  . Osteopenia 11/22/2016  . Pre-diabetes 12/25/2015  . Obesity (BMI 30-39.9) 12/25/2015  . Cramp of both lower extremities 07/02/2015  . GERD (gastroesophageal reflux disease) 05/07/2015  . Vitamin D deficiency 05/07/2015  . Metabolic syndrome 69/62/9528  . Post-menopausal bleeding 05/07/2015  . Cigarette smoker 03/08/2015  . Depressed 12/19/2013  . Essential hypertension, benign 12/19/2013   Past Medical History:  Diagnosis Date  . Depression   . 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 GERD (gastroesophageal reflux disease)   . HTN (hypertension)   . Neuropathy     Family History  Problem Relation Age of Onset  .  Coronary artery disease Father        States congenital abnormality  . Diabetes Father   . Hyperlipidemia Father   . CAD Sister        Stents   . Cancer Sister        breast  . Cancer Mother        breast  . Cancer Maternal Aunt        breast  . Cancer Maternal Grandmother        breast  . Anesthesia problems Neg Hx   . Hypotension Neg Hx   . Malignant hyperthermia Neg Hx   . Pseudochol deficiency Neg Hx     Past Surgical History:  Procedure Laterality Date  . APPENDECTOMY  1993  .  BREAST BIOPSY    . CATARACT EXTRACTION  08-2010   left   . CATARACT EXTRACTION W/PHACO  06/01/2011   Procedure: CATARACT EXTRACTION PHACO AND INTRAOCULAR LENS PLACEMENT (IOC);  Surgeon: Tonny Branch;  Location: [facility];  Service: Ophthalmology;  Laterality: Right;  CDE:9.62  . CHOLECYSTECTOMY    . RENAL ARTERY ANGIOPLASTY  2006   right  . TUBAL LIGATION  1984   [facility]   Social History   Occupational History  . Not on file  Tobacco Use  . Smoking status: Current Every Day Smoker    Packs/day: 0.50    Years: 30.00    Pack years: 15.00    Types: Cigarettes  . Smokeless tobacco: Never Used  . Tobacco comment: pt quit smoking 2016 for 7 months but restarted last year  Substance and Sexual Activity  . Alcohol use: No    Alcohol/week: 0.0 oz  . Drug use: Yes    Frequency: 0.5 times per week    Types: Marijuana  . Sexual activity: Yes    Birth control/protection: None, Post-menopausal

## 2017-09-18 ENCOUNTER — Encounter: Payer: Self-pay | Admitting: Family Medicine

## 2017-09-18 ENCOUNTER — Ambulatory Visit (INDEPENDENT_AMBULATORY_CARE_PROVIDER_SITE_OTHER): Payer: BLUE CROSS/BLUE SHIELD | Admitting: Family Medicine

## 2017-09-18 ENCOUNTER — Ambulatory Visit (INDEPENDENT_AMBULATORY_CARE_PROVIDER_SITE_OTHER): Payer: BLUE CROSS/BLUE SHIELD

## 2017-09-18 VITALS — BP 123/76 | HR 88 | Temp 99.0°F | Ht 64.0 in | Wt 204.0 lb

## 2017-09-18 DIAGNOSIS — Z01818 Encounter for other preprocedural examination: Secondary | ICD-10-CM | POA: Diagnosis not present

## 2017-09-18 DIAGNOSIS — J449 Chronic obstructive pulmonary disease, unspecified: Secondary | ICD-10-CM

## 2017-09-18 NOTE — Progress Notes (Signed)
HPI: Pt is a 57 y.o. female who is here for preoperative clearance for C5-6 ACDF, allograft, plate Right CTR.  Jamie Mcgee was evaluated for exertional dyspnea and atypical chest pain in January of this year.  She had an abnormal EKG with negative troponin.  She was evaluated by cardiology during that hospitalization and had a stress test which was determined to be low risk, and did not demonstrate any evidence of ischemia or blockages.  She was later evaluated by pulmonology, Dr. Melvyn Novas, outpatient who confirmed COPD, mixed type gold 3.  After being switched over from her ACE inhibitor to an ARB, her shortness of breath completely resolved.  She has had no recurrent chest pain since that time.  She is currently working on smoking cessation but continues to smoke 1 pack/day.  She bought a pack of nicotine patches and plans to start those today.  Her neck pain has been somewhat with intermittent use of tramadol.  She has not been scheduled for surgery yet but plans to be once this preop evaluation is completed.  1) High Risk Cardiac Conditions  1) Recent MI - No.  2) Decompensated Heart Failure - No.  3) Unstable angina - No.  4) Symptomatic arrythmia - No.  5) Sx Valvular Disease - No.  2) Intermediate Risk Factors - DM, CKD, CVA, CHF, CAD - No.  2) Functional Status - > 4 mets (Walk, run, climb stairs) Yes.  Jamie Mcgee: 24.2  3) Surgery Specific Risk -  Intermediate (Carotid, Head and Neck, Orthopaedic )           4) Further Noninvasive evaluation -   1) EKG - No.   1) Hx of CVA, CAD, DM, CKD  2) Echo - No.   1) Worsening dyspnea   3) Stress Testing - Active Cardiac Disease - No.  5) Need for medical therapy - Beta Blocker, Statins indicated ? No.  O: PE: Vitals:   09/18/17 1619  BP: 123/76  Pulse: 88  Temp: 99 F (37.2 C)   Physical Examination: General appearance - alert, well appearing, and in no distress, oriented to person, place, and time, overweight  and well hydrated Mental status - normal mood, behavior, speech, dress, motor activity, and thought processes Eyes - pupils equal and reactive, extraocular eye movements intact, sclera anicteric Mouth - mucous membranes moist, pharynx normal without lesions Neck - supple, no significant adenopathy, AROM decreased 2/2 pain Chest - mild intermittent expiratory wheeze noted. Air movement fair. Heart - normal rate, regular rhythm, normal S1, S2, no murmurs, rubs, clicks or gallops      Dg Chest 2 View  Result Date: 09/18/2017 CLINICAL DATA:  Preop cervical fusion and carpal tunnel. EXAM: CHEST - 2 VIEW COMPARISON:  07/03/2017. FINDINGS: The heart size and mediastinal contours are within normal limits. Both lungs are clear. The visualized skeletal structures are unremarkable. Improved aeration compared with priors. IMPRESSION: No active cardiopulmonary disease. Electronically Signed   By: Staci Righter M.D.   On: 09/18/2017 16:46   A/P: I have independently evaluated patient.  Jamie Mcgee is a 57 y.o. female who is low risk for a moderate risk surgery.    There are modifiable risk factors (smoking).  Jamie Mcgee's RCRI calculation for MACE is: 0 (3.9%).    1. Pre-operative clearance CXR obtained given expiratory wheezes.  No acute processes noted.  I recommended that patient start back her Dulera and stop smoking.  Her stress test in  January was low risk. - DG Chest 2 View; Future  2. COPD  GOLD III Dulera and smoking cessation.  We discussed that smoking increases her risk for adverse outcomes and poor healing. - DG Chest 2 View; Future  Jamie Mcgee M. Lajuana Ripple, DO Requesting [place]

## 2017-09-19 ENCOUNTER — Other Ambulatory Visit: Payer: Self-pay | Admitting: Family

## 2017-09-19 ENCOUNTER — Other Ambulatory Visit: Payer: Self-pay | Admitting: Family Medicine

## 2017-09-19 DIAGNOSIS — G6289 Other specified polyneuropathies: Secondary | ICD-10-CM

## 2017-09-19 MED ORDER — MOMETASONE FURO-FORMOTEROL FUM 200-5 MCG/ACT IN AERO: 2.0000 | INHALATION_SPRAY | Freq: Two times a day (BID) | RESPIRATORY_TRACT | 3 refills | Status: DC

## 2017-09-24 ENCOUNTER — Telehealth (INDEPENDENT_AMBULATORY_CARE_PROVIDER_SITE_OTHER): Payer: Self-pay | Admitting: Orthopaedic Surgery

## 2017-09-24 ENCOUNTER — Telehealth: Payer: Self-pay | Admitting: Internal Medicine

## 2017-09-24 NOTE — Telephone Encounter (Signed)
Spoke with pt, she is having cervical spine surgery with Dr. Lorin Mercy and needs surgical clearance for them to  Dr. Melvyn Novas was booked and. TP had an opening tomorrow so I placed her in a 9:45 am spot. Nothing further is needed.

## 2017-09-24 NOTE — Telephone Encounter (Signed)
Patient requesting a call back from you, did not give any details. Home # [phone]

## 2017-09-25 ENCOUNTER — Encounter: Payer: Self-pay | Admitting: Adult Health

## 2017-09-25 ENCOUNTER — Other Ambulatory Visit: Payer: Self-pay | Admitting: Internal Medicine

## 2017-09-25 ENCOUNTER — Ambulatory Visit (INDEPENDENT_AMBULATORY_CARE_PROVIDER_SITE_OTHER): Payer: BLUE CROSS/BLUE SHIELD | Admitting: Adult Health

## 2017-09-25 VITALS — BP 126/74 | HR 100 | Ht 65.0 in | Wt 204.4 lb

## 2017-09-25 DIAGNOSIS — Z01818 Encounter for other preprocedural examination: Secondary | ICD-10-CM | POA: Diagnosis not present

## 2017-09-25 DIAGNOSIS — J449 Chronic obstructive pulmonary disease, unspecified: Secondary | ICD-10-CM | POA: Diagnosis not present

## 2017-09-25 NOTE — Assessment & Plan Note (Signed)
COPD with suspected asthmatic component.  Patient spirometry repeated today with significant improvement now with more restrictive pattern.  May have a component of asthma.  She is improved since starting [facility] and is off ACE inhibitor. Patient is encouraged on smoking cessation. Repeat PFT   Plan  Patient Instructions  Continue on Dulera 2 puffs Twice daily  , rinse after use.  Work on not smoking. Good luck with your upcoming surgery  Follow up with Dr. Melvyn Novas  In 3 months with PFT and As needed   Please contact office for sooner follow up if symptoms do not improve or worsen or seek emergency care

## 2017-09-25 NOTE — Patient Instructions (Signed)
Continue on Dulera 2 puffs Twice daily  , rinse after use.  Work on not smoking. Good luck with your upcoming surgery  Follow up with Dr. Melvyn Novas  In 3 months with PFT and As needed   Please contact office for sooner follow up if symptoms do not improve or worsen or seek emergency care

## 2017-09-25 NOTE — Assessment & Plan Note (Signed)
Pulmonary Preop Clearance - pt spirometry improved with moderate restriction - COPD /Asthma component  Discussed with pt she has moderate pulmonary surgical risk  Encouraged on smoking cessation    Major Pulmonary risks identified in the multifactorial risk analysis are but not limited to a) pneumonia; b) recurrent intubation risk; c) prolonged or recurrent acute respiratory failure needing mechanical ventilation; d) prolonged hospitalization; e) DVT/Pulmonary embolism; f) Acute Pulmonary edema  Recommend 1. Short duration of surgery as much as possible and avoid paralytic if possible 2. Recovery in step down or ICU with Pulmonary consultation if indicated.  3. DVT prophylaxis 4. Aggressive pulmonary toilet with o2, bronchodilatation, and incentive spirometry and early ambulation

## 2017-09-25 NOTE — Progress Notes (Signed)
@Patient  ID: Jamie Mcgee, female    DOB: [birth year], 57 y.o.   MRN: [id number]  Chief Complaint  Patient presents with  . Follow-up    Referring provider: [name], DO  HPI: 57 year old female active smoker seen for pulmonary consult July 12, 2017 for shortness of breath found to have Gold 3 COPD  Past medical history significant for hypertension, renal artery dysplasia  TEST  Spirometry 07/12/2017  FEV1 1.01 (38%)  Ratio 65 with mod curvature   Chest x-ray September 18, 2017 was clear  09/25/2017 Follow up : COPD , Surgical clearance  Pt returns for follow up for COPD .  Patient was seen last visit for a pulmonary consult for shortness of breath and cough.  She was found to have Gold 3 COPD.  She also was on ACE inhibitor with significant cough.  Her ACE inhibitor was stopped.  She was started on [facility] Since last visit she is feeling better.Cough is much better off ACE inhibitor .  Says overall her breathing is doing okay.  She does get some shortness of breath intermittently. She works full-time.  She lives independently.  She does her own housework.  And cooking.  She denies any shortness of breath at rest.  No significant cough. Spirometry last visit showed an FEV1 at 38%, ratio 65, FVC 46.  Mid flows were decreased at 24%. Spirometry was repeated today and shows significant improvement with FEV1 at 54%, ratio 76, FVC 55%.Marland Kitchen Ruthe Mannan last taken 4 hr ago)   Patient is planned to go undergo cervical spine surgery Work fulltime . Out of work for last 3 weeks due to neck pain. 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  Also going to have right carpal tunnel .  Had gallbladder surgery last year with no known surgical problems.   Has cut back on smoking , down to 5 cigs /day .    Allergies  Allergen Reactions  . Lipitor [Atorvastatin] Other (See Comments)    Muscle aches and nausea  . Naproxen     Stomach pain     There is no immunization history on file for this patient.  Past  Medical History:  Diagnosis Date  . Depression   . 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 GERD (gastroesophageal reflux disease)   . HTN (hypertension)   . Neuropathy     Tobacco History: Social History   Tobacco Use  Smoking Status Current Every Day Smoker  . Packs/day: 0.50  . Years: 30.00  . Pack years: 15.00  . Types: Cigarettes  Smokeless Tobacco Never Used  Tobacco Comment   pt quit smoking 2016 for 7 months but restarted last year   Ready to quit: No Counseling given: Yes Comment: pt quit smoking 2016 for 7 months but restarted last year   Outpatient Encounter Medications as of 09/25/2017  Medication Sig  . carvedilol (COREG) 6.25 MG tablet TAKE 1 TABLET BY MOUTH TWICE DAILY WITH A MEAL  . citalopram (CELEXA) 20 MG tablet TAKE 1 TABLET BY MOUTH ONCE DAILY  . gabapentin (NEURONTIN) 800 MG tablet TAKE 1 TABLET BY MOUTH THREE TIMES DAILY  . hydrochlorothiazide (HYDRODIURIL) 25 MG tablet TAKE 1 TABLET BY MOUTH ONCE DAILY  . mometasone-formoterol (DULERA) 200-5 MCG/ACT AERO Inhale 2 puffs into the lungs 2 (two) times daily.  . nitroGLYCERIN (NITROSTAT) 0.4 MG SL tablet Place 1 tablet (0.4 mg total) under the tongue every 5 (five) minutes as needed.  Marland Kitchen olmesartan (BENICAR) 20 MG  tablet Take 1 tablet (20 mg total) by mouth daily.  Marland Kitchen omeprazole (PRILOSEC OTC) 20 MG tablet Take 20 mg by mouth daily.  . traMADol (ULTRAM) 50 MG tablet Take 1 tablet (50 mg total) by mouth every 12 (twelve) hours as needed for severe pain.   No facility-administered encounter medications on file as of 09/25/2017.      Review of Systems  Constitutional:   No  weight loss, night sweats,  Fevers, chills, fatigue, or  lassitude.  HEENT:   No headaches,  Difficulty swallowing,  Tooth/dental problems, or  Sore throat,                No sneezing, itching, ear ache, nasal congestion, post nasal drip,   CV:  No chest pain,  Orthopnea, PND, swelling in lower extremities, anasarca,  dizziness, palpitations, syncope.   GI  No heartburn, indigestion, abdominal pain, nausea, vomiting, diarrhea, change in bowel habits, loss of appetite, bloody stools.   Resp: No shortness of breath with exertion or at rest.  No excess mucus, no productive cough,  No non-productive cough,  No coughing up of blood.  No change in color of mucus.  No wheezing.  No chest wall deformity  Skin: no rash or lesions.  GU: no dysuria, change in color of urine, no urgency or frequency.  No flank pain, no hematuria   MS:  Neck pain    Physical Exam  BP 126/74 (BP Location: Right Arm, Cuff Size: Normal)   Pulse 100   Ht 5\' 5"  (1.651 m)   Wt 204 lb 6.4 oz (92.7 kg)   LMP 05/25/2011   SpO2 96%   BMI 34.01 kg/m   GEN: A/Ox3; pleasant , NAD, well nourished    HEENT:  [place]/AT,  EACs-clear, TMs-wnl, NOSE-clear, THROAT-clear, no lesions, no postnasal drip or exudate noted.   NECK:  Supple w/ fair ROM; no JVD; normal carotid impulses w/o bruits; no thyromegaly or nodules palpated; no lymphadenopathy.    RESP  Clear  P & A; w/o, wheezes/ rales/ or rhonchi. no accessory muscle use, no dullness to percussion  CARD:  RRR, no m/r/g, no peripheral edema, pulses intact, no cyanosis or clubbing.  GI:   Soft & nt; nml bowel sounds; no organomegaly or masses detected.   Musco: Warm bil, no deformities or joint swelling noted.   Neuro: alert, no focal deficits noted.    Skin: Warm, no lesions or rashes    Lab Results:  CBC    Component Value Date/Time   WBC 11.8 (H) 07/16/2017 1617   WBC 8.0 07/03/2017 0928   RBC 4.92 07/16/2017 1617   RBC 4.97 07/03/2017 0928   HGB 14.7 07/16/2017 1617   HCT 44.2 07/16/2017 1617   PLT 317 07/16/2017 1617   MCV 90 07/16/2017 1617   MCH 29.9 07/16/2017 1617   MCH 30.0 07/03/2017 0928   MCHC 33.3 07/16/2017 1617   MCHC 33.6 07/03/2017 0928   RDW 13.7 07/16/2017 1617   LYMPHSABS 4.1 (H) 07/16/2017 1617   MONOABS 0.5 08/22/2015 2015   EOSABS 0.1 07/16/2017  1617   BASOSABS 0.1 07/16/2017 1617    BMET    Component Value Date/Time   NA 142 07/16/2017 1617   K 3.7 07/16/2017 1617   CL 98 07/16/2017 1617   CO2 26 07/16/2017 1617   GLUCOSE 98 07/16/2017 1617   GLUCOSE 107 (H) 07/03/2017 0928   BUN 10 07/16/2017 1617   CREATININE 0.94 07/16/2017 1617   CALCIUM 9.6  07/16/2017 1617   GFRNONAA 68 07/16/2017 1617   GFRAA 78 07/16/2017 1617    BNP    Component Value Date/Time   BNP 45.6 06/29/2017 1141    ProBNP No results found for: PROBNP  Imaging: Dg Chest 2 View  Result Date: 09/18/2017 CLINICAL DATA:  Preop cervical fusion and carpal tunnel. EXAM: CHEST - 2 VIEW COMPARISON:  07/03/2017. FINDINGS: The heart size and mediastinal contours are within normal limits. Both lungs are clear. The visualized skeletal structures are unremarkable. Improved aeration compared with priors. IMPRESSION: No active cardiopulmonary disease. Electronically Signed   By: Staci Righter M.D.   On: 09/18/2017 16:46   Mm Digital Screening Bilateral  Result Date: 08/31/2017 CLINICAL DATA:  Screening. EXAM: DIGITAL SCREENING BILATERAL MAMMOGRAM WITH CAD COMPARISON:  Previous exam(s). ACR Breast Density Category b: There are scattered areas of fibroglandular density. FINDINGS: There are no findings suspicious for malignancy. Images were processed with CAD. IMPRESSION: No mammographic evidence of malignancy. A result letter of this screening mammogram will be mailed directly to the patient. RECOMMENDATION: Screening mammogram in one year. (Code:SM-B-01Y) BI-RADS CATEGORY  1: Negative. Electronically Signed   By: Curlene Dolphin M.D.   On: 08/31/2017 16:01     Assessment & Plan:   No problem-specific Assessment & Plan notes found for this encounter.     Rexene Edison, NP 09/25/2017

## 2017-09-26 ENCOUNTER — Encounter (INDEPENDENT_AMBULATORY_CARE_PROVIDER_SITE_OTHER): Payer: Self-pay | Admitting: Physical Medicine and Rehabilitation

## 2017-09-27 ENCOUNTER — Encounter (INDEPENDENT_AMBULATORY_CARE_PROVIDER_SITE_OTHER): Payer: Self-pay | Admitting: Surgery

## 2017-09-27 ENCOUNTER — Ambulatory Visit (INDEPENDENT_AMBULATORY_CARE_PROVIDER_SITE_OTHER): Payer: BLUE CROSS/BLUE SHIELD | Admitting: Surgery

## 2017-09-27 VITALS — BP 110/72 | HR 71 | Temp 97.5°F | Ht 65.0 in | Wt 204.0 lb

## 2017-09-27 DIAGNOSIS — M4722 Other spondylosis with radiculopathy, cervical region: Secondary | ICD-10-CM

## 2017-09-27 DIAGNOSIS — G5603 Carpal tunnel syndrome, bilateral upper limbs: Secondary | ICD-10-CM | POA: Diagnosis not present

## 2017-09-27 NOTE — Progress Notes (Signed)
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  We have received appropriate preop clearances.  Surgical procedure discussed in great detail with patient.  Advised her that with the high demand job that she does that she can anticipate being out of work at least 6 to 12 weeks postop.  All questions answered

## 2017-09-27 NOTE — H&P (Signed)
Jamie Mcgee is an 57 y.o. female.   Chief Complaint: Neck pain, right upper extremity radiculopathy and right carpal tunnel syndrome HPI: Patient with history of C5-6 HNP/stenosis and right carpal tunnel syndrome presents for preop evaluation.  Progressive worsening symptoms.  Failed conservative treatment.  Past Medical History:  Diagnosis Date  . Depression   . 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 GERD (gastroesophageal reflux disease)   . HTN (hypertension)   . Neuropathy     Past Surgical History:  Procedure Laterality Date  . APPENDECTOMY  1993  . BREAST BIOPSY    . CATARACT EXTRACTION  08-2010   left   . CATARACT EXTRACTION W/PHACO  06/01/2011   Procedure: CATARACT EXTRACTION PHACO AND INTRAOCULAR LENS PLACEMENT (IOC);  Surgeon: Tonny Branch;  Location: [facility];  Service: Ophthalmology;  Laterality: Right;  CDE:9.62  . CHOLECYSTECTOMY    . RENAL ARTERY ANGIOPLASTY  2006   right  . TUBAL LIGATION  1984   [facility]    Family History  Problem Relation Age of Onset  . Coronary artery disease Father        States congenital abnormality  . Diabetes Father   . Hyperlipidemia Father   . CAD Sister        Stents   . Cancer Sister        breast  . Cancer Mother        breast  . Cancer Maternal Aunt        breast  . Cancer Maternal Grandmother        breast  . Anesthesia problems Neg Hx   . Hypotension Neg Hx   . Malignant hyperthermia Neg Hx   . Pseudochol deficiency Neg Hx    Social History:  reports that she has been smoking cigarettes.  She has a 15.00 pack-year smoking history. She has never used smokeless tobacco. She reports that she has current or past drug history. Drug: Marijuana. Frequency: 0.50 times per week. She reports that she does not drink alcohol.  Allergies:  Allergies  Allergen Reactions  . Lipitor [Atorvastatin] Other (See Comments)    Muscle aches and nausea  . Naproxen     Stomach pain    No medications prior to admission.     No results found for this or any previous visit (from the past 48 hour(s)). No results found.  Review of Systems  Constitutional: Negative.   HENT: Negative.   Respiratory: Positive for cough and shortness of breath (Chronic exertional).   Cardiovascular: Negative.   Gastrointestinal: Negative.   Genitourinary: Negative.   Musculoskeletal: Positive for neck pain.  Neurological: Positive for tingling.  Psychiatric/Behavioral: Negative.     Last menstrual period 05/25/2011. Physical Exam  Constitutional: She is oriented to person, place, and time. No distress.  HENT:  Head: Normocephalic and atraumatic.  Eyes: Pupils are equal, round, and reactive to light.  Cardiovascular: Normal rate.  Respiratory: Effort normal. No respiratory distress.  GI: She exhibits no distension. There is no tenderness.  Musculoskeletal:  Ortho Exam patient has bilateral brachial plexus tenderness worse on the right than left.  Positive Spurling on the right.  Left side is negative.Lhermitte test is negative.  Upper extremity reflexes are 2+ and symmetrical.  She has discomfort with positive carpal compression test more on the right than left.  No thenar atrophy.  Mild discomfort with impingement test on the right negative drop arm test.  Can get her arms overhead easily.  No lower  extremity hyperreflexia.  No quad atrophy.  Quads anterior tib gastrocsoleus is strong normal heel and toe walking.  No clonus  Neurological: She is alert and oriented to person, place, and time.  Skin: Skin is warm and dry.  Psychiatric: She has a normal mood and affect.     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  Surgical procedure along with possible risks/complications and rehab/recovery time discussed in great detail.  All questions answered.  Patient has a very high demand job with lifting, pushing, pulling  squatting.  I advised that she can anticipate being out of work at least 6 to 12 weeks.  Patient states that she does not have any light duty available to her.  Benjiman Core, PA-C 09/27/2017, 11:58 AM

## 2017-09-27 NOTE — Telephone Encounter (Signed)
I left voicemail returning patient's call. Asked for return call to [facility] office today.

## 2017-10-02 ENCOUNTER — Other Ambulatory Visit: Payer: Self-pay | Admitting: Family

## 2017-10-02 NOTE — Pre-Procedure Instructions (Signed)
Jamie Mcgee  10/02/2017      [facility] [address] [place] [address] Phone: [phone] Fax: [place], [place] - [address] [id number] [name] [address] Phone: [phone] Fax: [phone]    Your procedure is scheduled on Fri., Oct 05, 2017  Report to [facility] Admitting Entrance "A" at 5:30AM  Call this number if you have problems the morning of surgery:  [phone]   Remember:  Do not eat food or drink liquids after midnight.  Take these medicines the morning of surgery with A SIP OF WATER: Carvedilol (COREG), Fexofenadine (ALLEGRA), Gabapentin (NEURONTIN), Omeprazole (PRILOSEC OTC), and Mometasone-formoterol (DULERA). If needed Acetaminophen (TYLENOL) for mild pain, TraMADol (ULTRAM) for moderate pain, and NitroGLYCERIN (NITROSTAT) for chest pain (Notify the nurse if you had to use this medicine).  As of today, stop taking all Aspirins, Vitamins, Fish oils, and Herbal medications. Also stop all NSAIDS i.e. Advil, Ibuprofen, Motrin, Aleve, Anaprox, Naproxen, BC and Goody Powders.   Do not wear jewelry, make-up or nail polish.  Do not wear lotions, powders, or perfumes, or deodorant.  Do not shave 48 hours prior to surgery.    Do not bring valuables to the hospital.  [facility] is not responsible for any belongings or valuables.  Contacts, dentures or bridgework may not be worn into surgery.  Leave your suitcase in the car.  After surgery it may be brought to your room.  For patients admitted to the hospital, discharge time will be determined by your treatment team.  Patients discharged the day of surgery will not be allowed to drive home.   Special instructions:   [place]- Preparing For Surgery  Before surgery, you can play an important role. Because skin is not sterile, your skin needs to be as free of germs as possible. You can reduce the number of  germs on your skin by washing with CHG (chlorahexidine gluconate) Soap before surgery.  CHG is an antiseptic cleaner which kills germs and bonds with the skin to continue killing germs even after washing.  Please do not use if you have an allergy to CHG or antibacterial soaps. If your skin becomes reddened/irritated stop using the CHG.  Do not shave (including legs and underarms) for at least 48 hours prior to first CHG shower. It is OK to shave your face.  Please follow these instructions carefully.   1. Shower the NIGHT BEFORE SURGERY and the MORNING OF SURGERY with CHG.   2. If you chose to wash your hair, wash your hair first as usual with your normal shampoo.  3. After you shampoo, rinse your hair and body thoroughly to remove the shampoo.  4. Use CHG as you would any other liquid soap. You can apply CHG directly to the skin and wash gently with a scrungie or a clean washcloth.   5. Apply the CHG Soap to your body ONLY FROM THE NECK DOWN.  Do not use on open wounds or open sores. Avoid contact with your eyes, ears, mouth and genitals (private parts). Wash Face and genitals (private parts)  with your normal soap.  6. Wash thoroughly, paying special attention to the area where your surgery will be performed.  7. Thoroughly rinse your body with warm water from the neck down.  8. DO NOT shower/wash with your normal soap after using and rinsing off the CHG  Soap.  9. Pat yourself dry with a CLEAN TOWEL.  10. Wear CLEAN PAJAMAS to bed the night before surgery, wear comfortable clothes the morning of surgery  11. Place CLEAN SHEETS on your bed the night of your first shower and DO NOT SLEEP WITH PETS.  Day of Surgery: Do not apply any deodorants/lotions. Please wear clean clothes to the hospital/surgery center.    Please read over the following fact sheets that you were given. Pain Booklet, Coughing and Deep Breathing, MRSA Information and Surgical Site Infection Prevention

## 2017-10-03 ENCOUNTER — Encounter (HOSPITAL_COMMUNITY)
Admission: RE | Admit: 2017-10-03 | Discharge: 2017-10-03 | Disposition: A | Discharge status: Home / Self Care | Payer: BLUE CROSS/BLUE SHIELD | Source: Ambulatory Visit | Attending: Orthopaedic Surgery | Admitting: Orthopaedic Surgery

## 2017-10-03 ENCOUNTER — Other Ambulatory Visit: Payer: Self-pay

## 2017-10-03 ENCOUNTER — Encounter (HOSPITAL_COMMUNITY): Payer: Self-pay

## 2017-10-03 DIAGNOSIS — Z01812 Encounter for preprocedural laboratory examination: Secondary | ICD-10-CM | POA: Insufficient documentation

## 2017-10-03 HISTORY — DX: Spinal stenosis, cervical region: M48.02

## 2017-10-03 HISTORY — DX: Chronic obstructive pulmonary disease, unspecified: J44.9

## 2017-10-03 HISTORY — DX: Inflammatory liver disease, unspecified: K75.9

## 2017-10-03 HISTORY — DX: Other cervical disc displacement, unspecified cervical region: M50.20

## 2017-10-03 HISTORY — DX: Carpal tunnel syndrome, right upper limb: G56.01

## 2017-10-03 LAB — URINALYSIS, ROUTINE W REFLEX MICROSCOPIC
Bilirubin Urine: NEGATIVE
GLUCOSE, UA: NEGATIVE mg/dL
Hgb urine dipstick: NEGATIVE
KETONES UR: NEGATIVE mg/dL
Leukocytes, UA: NEGATIVE
Nitrite: NEGATIVE
PROTEIN: NEGATIVE mg/dL
Specific Gravity, Urine: 1.014 (ref 1.005–1.030)
pH: 7 (ref 5.0–8.0)

## 2017-10-03 LAB — CBC
HEMATOCRIT: 44 % (ref 36.0–46.0)
Hemoglobin: 15.3 g/dL — ABNORMAL HIGH (ref 12.0–15.0)
MCH: 30.9 pg (ref 26.0–34.0)
MCHC: 34.8 g/dL (ref 30.0–36.0)
MCV: 88.9 fL (ref 78.0–100.0)
PLATELETS: 292 10*3/uL (ref 150–400)
RBC: 4.95 MIL/uL (ref 3.87–5.11)
RDW: 13.3 % (ref 11.5–15.5)
WBC: 11.7 10*3/uL — AB (ref 4.0–10.5)

## 2017-10-03 LAB — COMPREHENSIVE METABOLIC PANEL
ALBUMIN: 3.4 g/dL — AB (ref 3.5–5.0)
ALT: 42 U/L (ref 14–54)
AST: 50 U/L — AB (ref 15–41)
Alkaline Phosphatase: 103 U/L (ref 38–126)
Anion gap: 14 (ref 5–15)
BUN: 9 mg/dL (ref 6–20)
CHLORIDE: 96 mmol/L — AB (ref 101–111)
CO2: 28 mmol/L (ref 22–32)
CREATININE: 0.87 mg/dL (ref 0.44–1.00)
Calcium: 9 mg/dL (ref 8.9–10.3)
GFR calc Af Amer: >60 mL/min (ref >60)
GLUCOSE: 99 mg/dL (ref 65–99)
POTASSIUM: 3.3 mmol/L — AB (ref 3.5–5.1)
Sodium: 138 mmol/L (ref 135–145)
Total Bilirubin: 0.6 mg/dL (ref 0.3–1.2)
Total Protein: 7.4 g/dL (ref 6.5–8.1)

## 2017-10-03 LAB — SURGICAL PCR SCREEN
MRSA, PCR: NEGATIVE
STAPHYLOCOCCUS AUREUS: POSITIVE — AB

## 2017-10-03 NOTE — Telephone Encounter (Signed)
No response from patient. Will await return call.

## 2017-10-03 NOTE — Progress Notes (Signed)
PCP - Dr. Adam Phenix- Clearance note 4/16  Cardiologist - Dr. Jeanice Lim- NP Tammy Parrett- Clearance note 4/23  Chest x-ray - 09/18/17 (E)  EKG - 07/04/17 (E)  Stress Test - 03/09/15 (E)  ECHO - 03/08/18 (E)  Cardiac Cath - Denies  Sleep Study - Denies- Pt sts she will be scheduled soon, per her pcp CPAP - None  LABS- 10/03/17: CBC, CMP, UA  Anesthesia- Yes- clearance note  Pt denies having chest pain, sob, or fever at this time. All instructions explained to the pt, with a verbal understanding of the material. Pt agrees to go over the instructions while at home for a better understanding. The opportunity to ask questions was provided.

## 2017-10-04 ENCOUNTER — Encounter (HOSPITAL_COMMUNITY): Payer: Self-pay

## 2017-10-04 NOTE — Progress Notes (Signed)
Anesthesia Chart Review:   Case:  [id number] Date/Time:  10/05/17 0715   Procedures:      C5-6 ANTERIOR CERVICAL DECOMPRESSION/DISCECTOMY FUSION,  ALLOGRAFT, PLATE  (NECK FIRST AND CARPAL TUNNEL RELEASE SECOND) (N/A )     RIGHT CARPAL TUNNEL RELEASE (Right )   Anesthesia type:  General   Pre-op diagnosis:  C5-6 SPONDYLOSIS, STENOSIS, RIGHT CARPAL TUNNEL SYNDROME   Location:  [facility] 05 / [facility]   Surgeon:  Marybelle Killings, MD      DISCUSSION:  - Pt is a 57 year old female with COPD (has been optimized by pulmonary for surgery). Current smoker.  - Hospitalized 1/29-1/30/19 for chest pain; troponin negative, stress test normal. Sx thought to be pulmonary in origin   VS: BP 112/72   Pulse 79   Temp 37.1 C (Oral)   Resp 17   Ht 5\' 5"  (1.651 m)   Wt 204 lb 8 oz (92.8 kg)   LMP 05/25/2011   SpO2 97%   BMI 34.03 kg/m    PROVIDERS: PCP is [name], DO who cleared pt for surgery  Cardiologist is Kate Sable, MD. Last evaluation by Rozann Lesches, MD on 07/03/17 while pt was inpatient for chest pain. Stress test low risk.     Pulmonologist is Christinia Gully, MD. Cleared for surgery at moderate pulmonary surgical risk at last office visit 09/25/17 with Rexene Edison, NP who notes:   "Major Pulmonary risks identified in the multifactorial risk analysis are but not limited to a) pneumonia; b) recurrent intubation risk; c) prolonged or recurrent acute respiratory failure needing mechanical ventilation; d) prolonged hospitalization; e) DVT/Pulmonary embolism; f) Acute Pulmonary edema  Recommend 1. Short duration of surgery as much as possible and avoid paralytic if possible 2. Recovery in step down or ICU with Pulmonary consultation if indicated.  3. DVT prophylaxis 4. Aggressive pulmonary toilet with o2, bronchodilatation, and incentive spirometry and early ambulation"     LABS: Labs reviewed: Acceptable for surgery. (all labs ordered are listed, but only abnormal  results are displayed)  Labs Reviewed  SURGICAL PCR SCREEN - Abnormal; Notable for the following components:      Result Value   Staphylococcus aureus POSITIVE (*)    All other components within normal limits  CBC - Abnormal; Notable for the following components:   WBC 11.7 (*)    Hemoglobin 15.3 (*)    All other components within normal limits  COMPREHENSIVE METABOLIC PANEL - Abnormal; Notable for the following components:   Potassium 3.3 (*)    Chloride 96 (*)    Albumin 3.4 (*)    AST 50 (*)    All other components within normal limits  URINALYSIS, ROUTINE W REFLEX MICROSCOPIC - Abnormal; Notable for the following components:   Bacteria, UA RARE (*)    All other components within normal limits     IMAGES:  CXR 09/18/17: No active cardiopulmonary disease.   EKG 07/04/17: NSR. LAD. Septal infarct, age undetermined. Inferior infarct, age undetermined   CV:  Nuclear stress test 07/04/17:   No diagnostic ST segment changes to indicate ischemia.  Small, moderate intensity, basal anteroseptal defect that is fixed and most consistent with soft tissue attenuation.  This is a low risk study.  Nuclear stress EF: 68%.  Echo 03/09/15:  - Left ventricle: The cavity size was normal. Wall thickness was increased in a pattern of mild LVH. Systolic function was normal. The estimated ejection fraction was in the range of 60%  to 65%. Wall motion was normal; there were no regional wall motion abnormalities. Indeterminate diastolic function   Past Medical History:  Diagnosis Date  . Carpal tunnel syndrome on right   . Cervical stenosis of spine    C5-6  . COPD (chronic obstructive pulmonary disease) ([place])    Gold III  . Depression   . Fibromuscular dysplasia of renal artery ([place]) 2006  . GERD (gastroesophageal reflux disease)   . Hepatitis    type A in 1977  . HNP (herniated nucleus pulposus), cervical   . HTN (hypertension)   . Neuropathy     Past Surgical History:   Procedure Laterality Date  . APPENDECTOMY  1993  . BREAST BIOPSY    . CATARACT EXTRACTION  08-2010   left   . CATARACT EXTRACTION W/PHACO  06/01/2011   Procedure: CATARACT EXTRACTION PHACO AND INTRAOCULAR LENS PLACEMENT (IOC);  Surgeon: Tonny Branch;  Location: [facility];  Service: Ophthalmology;  Laterality: Right;  CDE:9.62  . CHOLECYSTECTOMY    . RENAL ARTERY ANGIOPLASTY  2006   right  . TUBAL LIGATION  1984   [facility]    MEDICATIONS: . nicotine polacrilex (NICORETTE) 4 MG gum  . acetaminophen (TYLENOL) 650 MG CR tablet  . carvedilol (COREG) 6.25 MG tablet  . citalopram (CELEXA) 20 MG tablet  . fexofenadine (ALLEGRA) 180 MG tablet  . gabapentin (NEURONTIN) 800 MG tablet  . hydrochlorothiazide (HYDRODIURIL) 25 MG tablet  . losartan (COZAAR) 100 MG tablet  . mometasone-formoterol (DULERA) 200-5 MCG/ACT AERO  . nitroGLYCERIN (NITROSTAT) 0.4 MG SL tablet  . olmesartan (BENICAR) 20 MG tablet  . omeprazole (PRILOSEC OTC) 20 MG tablet  . traMADol (ULTRAM) 50 MG tablet   No current facility-administered medications for this encounter.     If no changes, I anticipate pt can proceed with surgery as scheduled.   Willeen Cass, FNP-BC [facility]/Anesthesiology Phone: [phone] 10/04/2017 10:16 AM

## 2017-10-04 NOTE — Anesthesia Preprocedure Evaluation (Addendum)
Anesthesia Evaluation  Patient identified by MRN, date of birth, ID band Patient awake    Reviewed: Allergy & Precautions, H&P , NPO status , Patient's Chart, lab work & pertinent test results  Airway Mallampati: II  TM Distance: >3 FB Neck ROM: Full    Dental no notable dental hx. (+) Teeth Intact, Dental Advisory Given   Pulmonary COPD,  COPD inhaler, former smoker,    Pulmonary exam normal breath sounds clear to auscultation       Cardiovascular Exercise Tolerance: Good hypertension, Pt. on medications + Peripheral Vascular Disease   Rhythm:Regular Rate:Normal     Neuro/Psych Depression negative neurological ROS     GI/Hepatic Neg liver ROS, GERD  Medicated and Controlled,  Endo/Other  negative endocrine ROS  Renal/GU negative Renal ROS  negative genitourinary   Musculoskeletal   Abdominal   Peds  Hematology negative hematology ROS (+)   Anesthesia Other Findings   Reproductive/Obstetrics negative OB ROS                            Anesthesia Physical Anesthesia Plan  ASA: II  Anesthesia Plan: General   Post-op Pain Management:    Induction: Intravenous  PONV Risk Score and Plan: 4 or greater and Ondansetron, Dexamethasone and Midazolam  Airway Management Planned: Oral ETT  Additional Equipment:   Intra-op Plan:   Post-operative Plan: Extubation in OR  Informed Consent: I have reviewed the patients History and Physical, chart, labs and discussed the procedure including the risks, benefits and alternatives for the proposed anesthesia with the patient or authorized representative who has indicated his/her understanding and acceptance.   Dental advisory given  Plan Discussed with: CRNA  Anesthesia Plan Comments:         Anesthesia Quick Evaluation

## 2017-10-05 ENCOUNTER — Ambulatory Visit (HOSPITAL_COMMUNITY): Payer: BLUE CROSS/BLUE SHIELD | Admitting: Certified Registered Nurse Anesthetist

## 2017-10-05 ENCOUNTER — Observation Stay (HOSPITAL_COMMUNITY)
Admission: RE | Admit: 2017-10-05 | Discharge: 2017-10-06 | Disposition: A | Discharge status: Home / Self Care | Payer: BLUE CROSS/BLUE SHIELD | Source: Ambulatory Visit | Attending: Orthopaedic Surgery | Admitting: Orthopaedic Surgery

## 2017-10-05 ENCOUNTER — Encounter (HOSPITAL_COMMUNITY)
Admission: RE | Disposition: A | Discharge status: Home / Self Care | Payer: Self-pay | Source: Ambulatory Visit | Attending: Orthopaedic Surgery

## 2017-10-05 ENCOUNTER — Ambulatory Visit (HOSPITAL_COMMUNITY): Payer: BLUE CROSS/BLUE SHIELD | Admitting: Emergency Medicine

## 2017-10-05 ENCOUNTER — Ambulatory Visit (HOSPITAL_COMMUNITY): Payer: BLUE CROSS/BLUE SHIELD

## 2017-10-05 DIAGNOSIS — Z888 Allergy status to other drugs, medicaments and biological substances status: Secondary | ICD-10-CM | POA: Diagnosis not present

## 2017-10-05 DIAGNOSIS — F17210 Nicotine dependence, cigarettes, uncomplicated: Secondary | ICD-10-CM | POA: Insufficient documentation

## 2017-10-05 DIAGNOSIS — G629 Polyneuropathy, unspecified: Secondary | ICD-10-CM | POA: Diagnosis not present

## 2017-10-05 DIAGNOSIS — M4322 Fusion of spine, cervical region: Secondary | ICD-10-CM | POA: Diagnosis not present

## 2017-10-05 DIAGNOSIS — M4802 Spinal stenosis, cervical region: Secondary | ICD-10-CM | POA: Diagnosis present

## 2017-10-05 DIAGNOSIS — Z419 Encounter for procedure for purposes other than remedying health state, unspecified: Secondary | ICD-10-CM

## 2017-10-05 DIAGNOSIS — I739 Peripheral vascular disease, unspecified: Secondary | ICD-10-CM | POA: Insufficient documentation

## 2017-10-05 DIAGNOSIS — I10 Essential (primary) hypertension: Secondary | ICD-10-CM | POA: Insufficient documentation

## 2017-10-05 DIAGNOSIS — K219 Gastro-esophageal reflux disease without esophagitis: Secondary | ICD-10-CM | POA: Insufficient documentation

## 2017-10-05 DIAGNOSIS — F329 Major depressive disorder, single episode, unspecified: Secondary | ICD-10-CM | POA: Insufficient documentation

## 2017-10-05 DIAGNOSIS — M4722 Other spondylosis with radiculopathy, cervical region: Secondary | ICD-10-CM | POA: Diagnosis not present

## 2017-10-05 DIAGNOSIS — M47892 Other spondylosis, cervical region: Secondary | ICD-10-CM | POA: Diagnosis not present

## 2017-10-05 DIAGNOSIS — Z79899 Other long term (current) drug therapy: Secondary | ICD-10-CM | POA: Diagnosis not present

## 2017-10-05 DIAGNOSIS — G5601 Carpal tunnel syndrome, right upper limb: Secondary | ICD-10-CM | POA: Diagnosis not present

## 2017-10-05 DIAGNOSIS — M5020 Other cervical disc displacement, unspecified cervical region: Secondary | ICD-10-CM | POA: Diagnosis not present

## 2017-10-05 DIAGNOSIS — J449 Chronic obstructive pulmonary disease, unspecified: Secondary | ICD-10-CM | POA: Insufficient documentation

## 2017-10-05 DIAGNOSIS — M4712 Other spondylosis with myelopathy, cervical region: Secondary | ICD-10-CM | POA: Diagnosis not present

## 2017-10-05 DIAGNOSIS — M50822 Other cervical disc disorders at C5-C6 level: Secondary | ICD-10-CM | POA: Diagnosis not present

## 2017-10-05 HISTORY — PX: ANTERIOR CERVICAL DECOMP/DISCECTOMY FUSION: SHX1161

## 2017-10-05 HISTORY — PX: CARPAL TUNNEL RELEASE: SHX101

## 2017-10-05 SURGERY — ANTERIOR CERVICAL DECOMPRESSION/DISCECTOMY FUSION 1 LEVEL
Anesthesia: General | Laterality: Right

## 2017-10-05 MED ORDER — CEFAZOLIN SODIUM-DEXTROSE 2-4 GM/100ML-% IV SOLN
INTRAVENOUS | Status: AC
  Filled 2017-10-05: qty 100

## 2017-10-05 MED ORDER — MOMETASONE FURO-FORMOTEROL FUM 200-5 MCG/ACT IN AERO
2.0000 | INHALATION_SPRAY | Freq: Two times a day (BID) | RESPIRATORY_TRACT | Status: DC
  Administered 2017-10-05 – 2017-10-06 (×2): 2 via RESPIRATORY_TRACT
  Filled 2017-10-05: qty 8.8

## 2017-10-05 MED ORDER — DOCUSATE SODIUM 100 MG PO CAPS
100.0000 mg | ORAL_CAPSULE | Freq: Two times a day (BID) | ORAL | Status: DC
  Administered 2017-10-05 – 2017-10-06 (×3): 100 mg via ORAL
  Filled 2017-10-05 (×3): qty 1

## 2017-10-05 MED ORDER — ONDANSETRON HCL 4 MG PO TABS: 4.0000 mg | ORAL_TABLET | Freq: Four times a day (QID) | ORAL | Status: DC | PRN

## 2017-10-05 MED ORDER — METHOCARBAMOL 1000 MG/10ML IJ SOLN
500.0000 mg | Freq: Four times a day (QID) | INTRAVENOUS | Status: DC | PRN
  Filled 2017-10-05: qty 5

## 2017-10-05 MED ORDER — GABAPENTIN 400 MG PO CAPS
800.0000 mg | ORAL_CAPSULE | Freq: Three times a day (TID) | ORAL | Status: DC
  Administered 2017-10-05 – 2017-10-06 (×3): 800 mg via ORAL
  Filled 2017-10-05 (×3): qty 2

## 2017-10-05 MED ORDER — LACTATED RINGERS IV SOLN
INTRAVENOUS | Status: DC | PRN
  Administered 2017-10-05 (×2): via INTRAVENOUS

## 2017-10-05 MED ORDER — MIDAZOLAM HCL 2 MG/2ML IJ SOLN
INTRAMUSCULAR | Status: AC
  Filled 2017-10-05: qty 2

## 2017-10-05 MED ORDER — BUPIVACAINE-EPINEPHRINE (PF) 0.5% -1:200000 IJ SOLN
INTRAMUSCULAR | Status: AC
  Filled 2017-10-05: qty 30

## 2017-10-05 MED ORDER — FENTANYL CITRATE (PF) 100 MCG/2ML IJ SOLN
INTRAMUSCULAR | Status: DC | PRN
  Administered 2017-10-05 (×2): 100 ug via INTRAVENOUS
  Administered 2017-10-05: 50 ug via INTRAVENOUS

## 2017-10-05 MED ORDER — METHOCARBAMOL 500 MG PO TABS: 500.0000 mg | ORAL_TABLET | Freq: Three times a day (TID) | ORAL | 0 refills | Status: DC | PRN

## 2017-10-05 MED ORDER — ROCURONIUM BROMIDE 100 MG/10ML IV SOLN
INTRAVENOUS | Status: DC | PRN
  Administered 2017-10-05: 50 mg via INTRAVENOUS

## 2017-10-05 MED ORDER — PROPOFOL 10 MG/ML IV BOLUS
INTRAVENOUS | Status: AC
  Filled 2017-10-05: qty 20

## 2017-10-05 MED ORDER — DEXAMETHASONE SODIUM PHOSPHATE 10 MG/ML IJ SOLN
INTRAMUSCULAR | Status: AC
  Filled 2017-10-05: qty 1

## 2017-10-05 MED ORDER — CHLORHEXIDINE GLUCONATE 4 % EX LIQD: 60.0000 mL | Freq: Once | CUTANEOUS | Status: DC

## 2017-10-05 MED ORDER — MENTHOL 3 MG MT LOZG: 1.0000 | LOZENGE | OROMUCOSAL | Status: DC | PRN

## 2017-10-05 MED ORDER — CEFAZOLIN SODIUM-DEXTROSE 2-4 GM/100ML-% IV SOLN
2.0000 g | INTRAVENOUS | Status: AC
  Administered 2017-10-05: 2 g via INTRAVENOUS

## 2017-10-05 MED ORDER — ONDANSETRON HCL 4 MG/2ML IJ SOLN: 4.0000 mg | Freq: Four times a day (QID) | INTRAMUSCULAR | Status: DC | PRN

## 2017-10-05 MED ORDER — OXYCODONE-ACETAMINOPHEN 5-325 MG PO TABS: 1.0000 | ORAL_TABLET | Freq: Four times a day (QID) | ORAL | 0 refills | Status: DC | PRN

## 2017-10-05 MED ORDER — PHENYLEPHRINE HCL 10 MG/ML IJ SOLN
INTRAVENOUS | Status: DC | PRN
  Administered 2017-10-05: 50 ug/min via INTRAVENOUS

## 2017-10-05 MED ORDER — DEXAMETHASONE SODIUM PHOSPHATE 10 MG/ML IJ SOLN
INTRAMUSCULAR | Status: DC | PRN
  Administered 2017-10-05: 10 mg via INTRAVENOUS

## 2017-10-05 MED ORDER — HYDROMORPHONE HCL 1 MG/ML IJ SOLN: 0.5000 mg | INTRAMUSCULAR | Status: DC | PRN

## 2017-10-05 MED ORDER — SUGAMMADEX SODIUM 200 MG/2ML IV SOLN
INTRAVENOUS | Status: AC
  Filled 2017-10-05: qty 2

## 2017-10-05 MED ORDER — HYDROMORPHONE HCL 2 MG/ML IJ SOLN
INTRAMUSCULAR | Status: AC
  Filled 2017-10-05: qty 1

## 2017-10-05 MED ORDER — BUPIVACAINE-EPINEPHRINE 0.5% -1:200000 IJ SOLN
INTRAMUSCULAR | Status: DC | PRN
  Administered 2017-10-05: 30 mL

## 2017-10-05 MED ORDER — SODIUM CHLORIDE 0.9 % IV SOLN: INTRAVENOUS | Status: DC

## 2017-10-05 MED ORDER — MIDAZOLAM HCL 2 MG/2ML IJ SOLN
INTRAMUSCULAR | Status: DC | PRN
  Administered 2017-10-05: 2 mg via INTRAVENOUS

## 2017-10-05 MED ORDER — CITALOPRAM HYDROBROMIDE 20 MG PO TABS
20.0000 mg | ORAL_TABLET | Freq: Every day | ORAL | Status: DC
  Administered 2017-10-05 – 2017-10-06 (×2): 20 mg via ORAL
  Filled 2017-10-05 (×2): qty 1

## 2017-10-05 MED ORDER — EPHEDRINE SULFATE-NACL 50-0.9 MG/10ML-% IV SOSY
PREFILLED_SYRINGE | INTRAVENOUS | Status: DC | PRN
  Administered 2017-10-05: 5 mg via INTRAVENOUS

## 2017-10-05 MED ORDER — LORATADINE 10 MG PO TABS
10.0000 mg | ORAL_TABLET | Freq: Every day | ORAL | Status: DC
  Administered 2017-10-06: 10 mg via ORAL
  Filled 2017-10-05: qty 1

## 2017-10-05 MED ORDER — ACETAMINOPHEN 325 MG PO TABS: 650.0000 mg | ORAL_TABLET | ORAL | Status: DC | PRN

## 2017-10-05 MED ORDER — LIDOCAINE 2% (20 MG/ML) 5 ML SYRINGE
INTRAMUSCULAR | Status: DC | PRN
  Administered 2017-10-05: 60 mg via INTRAVENOUS

## 2017-10-05 MED ORDER — OXYCODONE-ACETAMINOPHEN 5-325 MG PO TABS
1.0000 | ORAL_TABLET | ORAL | Status: DC | PRN
  Administered 2017-10-05 – 2017-10-06 (×5): 2 via ORAL
  Filled 2017-10-05 (×5): qty 2

## 2017-10-05 MED ORDER — SODIUM CHLORIDE 0.9% FLUSH
3.0000 mL | Freq: Two times a day (BID) | INTRAVENOUS | Status: DC
  Administered 2017-10-05 (×2): 3 mL via INTRAVENOUS

## 2017-10-05 MED ORDER — OMEPRAZOLE 20 MG PO CPDR
20.0000 mg | DELAYED_RELEASE_CAPSULE | Freq: Every day | ORAL | Status: DC
  Administered 2017-10-06: 20 mg via ORAL
  Filled 2017-10-05: qty 1

## 2017-10-05 MED ORDER — ONDANSETRON HCL 4 MG/2ML IJ SOLN
INTRAMUSCULAR | Status: AC
  Filled 2017-10-05: qty 2

## 2017-10-05 MED ORDER — METHOCARBAMOL 500 MG PO TABS
500.0000 mg | ORAL_TABLET | Freq: Four times a day (QID) | ORAL | Status: DC | PRN
  Administered 2017-10-05 – 2017-10-06 (×2): 500 mg via ORAL
  Filled 2017-10-05 (×2): qty 1

## 2017-10-05 MED ORDER — HYDROMORPHONE HCL 2 MG/ML IJ SOLN
0.3000 mg | INTRAMUSCULAR | Status: DC | PRN
  Administered 2017-10-05: 0.5 mg via INTRAVENOUS

## 2017-10-05 MED ORDER — LIDOCAINE 2% (20 MG/ML) 5 ML SYRINGE
INTRAMUSCULAR | Status: AC
  Filled 2017-10-05: qty 5

## 2017-10-05 MED ORDER — ONDANSETRON HCL 4 MG/2ML IJ SOLN
INTRAMUSCULAR | Status: DC | PRN
  Administered 2017-10-05: 4 mg via INTRAVENOUS

## 2017-10-05 MED ORDER — POLYETHYLENE GLYCOL 3350 17 G PO PACK
17.0000 g | PACK | Freq: Every day | ORAL | Status: DC
  Administered 2017-10-06: 17 g via ORAL
  Filled 2017-10-05: qty 1

## 2017-10-05 MED ORDER — FENTANYL CITRATE (PF) 250 MCG/5ML IJ SOLN
INTRAMUSCULAR | Status: AC
Start: 2017-10-05 — End: ?
  Filled 2017-10-05: qty 5

## 2017-10-05 MED ORDER — SUGAMMADEX SODIUM 200 MG/2ML IV SOLN
INTRAVENOUS | Status: DC | PRN
  Administered 2017-10-05: 200 mg via INTRAVENOUS

## 2017-10-05 MED ORDER — HEMOSTATIC AGENTS (NO CHARGE) OPTIME
TOPICAL | Status: DC | PRN
Start: 2017-10-05 — End: 2017-10-05
  Administered 2017-10-05: 1 via TOPICAL

## 2017-10-05 MED ORDER — CARVEDILOL 6.25 MG PO TABS
6.2500 mg | ORAL_TABLET | Freq: Two times a day (BID) | ORAL | Status: DC
  Administered 2017-10-05 – 2017-10-06 (×2): 6.25 mg via ORAL
  Filled 2017-10-05 (×2): qty 1

## 2017-10-05 MED ORDER — PHENOL 1.4 % MT LIQD: 1.0000 | OROMUCOSAL | Status: DC | PRN

## 2017-10-05 MED ORDER — SODIUM CHLORIDE 0.9% FLUSH: 3.0000 mL | INTRAVENOUS | Status: DC | PRN

## 2017-10-05 MED ORDER — HYDROCHLOROTHIAZIDE 25 MG PO TABS: 25.0000 mg | ORAL_TABLET | Freq: Every day | ORAL | Status: DC

## 2017-10-05 MED ORDER — ACETAMINOPHEN 650 MG RE SUPP: 650.0000 mg | RECTAL | Status: DC | PRN

## 2017-10-05 MED ORDER — OXYCODONE HCL 5 MG PO TABS: 5.0000 mg | ORAL_TABLET | ORAL | Status: DC | PRN

## 2017-10-05 MED ORDER — ROCURONIUM BROMIDE 50 MG/5ML IV SOLN
INTRAVENOUS | Status: AC
  Filled 2017-10-05: qty 1

## 2017-10-05 MED ORDER — PROPOFOL 10 MG/ML IV BOLUS
INTRAVENOUS | Status: DC | PRN
  Administered 2017-10-05: 110 mg via INTRAVENOUS
  Administered 2017-10-05: 50 mg via INTRAVENOUS

## 2017-10-05 MED ORDER — NITROGLYCERIN 0.4 MG SL SUBL: 0.4000 mg | SUBLINGUAL_TABLET | SUBLINGUAL | Status: DC | PRN

## 2017-10-05 MED ORDER — PHENYLEPHRINE 40 MCG/ML (10ML) SYRINGE FOR IV PUSH (FOR BLOOD PRESSURE SUPPORT)
PREFILLED_SYRINGE | INTRAVENOUS | Status: DC | PRN
  Administered 2017-10-05 (×2): 40 ug via INTRAVENOUS
  Administered 2017-10-05: 80 ug via INTRAVENOUS
  Administered 2017-10-05 (×2): 120 ug via INTRAVENOUS

## 2017-10-05 MED ORDER — FENTANYL CITRATE (PF) 250 MCG/5ML IJ SOLN
INTRAMUSCULAR | Status: AC
  Filled 2017-10-05: qty 5

## 2017-10-05 MED ORDER — CEFAZOLIN SODIUM-DEXTROSE 1-4 GM/50ML-% IV SOLN
1.0000 g | Freq: Three times a day (TID) | INTRAVENOUS | Status: AC
  Administered 2017-10-05 (×2): 1 g via INTRAVENOUS
  Filled 2017-10-05 (×2): qty 50

## 2017-10-05 MED ORDER — 0.9 % SODIUM CHLORIDE (POUR BTL) OPTIME
TOPICAL | Status: DC | PRN
  Administered 2017-10-05: 1000 mL

## 2017-10-05 MED ORDER — LOSARTAN POTASSIUM 50 MG PO TABS: 100.0000 mg | ORAL_TABLET | Freq: Every day | ORAL | Status: DC

## 2017-10-05 MED ORDER — NICOTINE POLACRILEX 2 MG MT GUM
4.0000 mg | CHEWING_GUM | OROMUCOSAL | Status: DC | PRN
Start: 2017-10-05 — End: 2017-10-06
  Filled 2017-10-05: qty 2

## 2017-10-05 SURGICAL SUPPLY — 70 items
APL SKNCLS STERI-STRIP NONHPOA (GAUZE/BANDAGES/DRESSINGS) ×10
BANDAGE ACE 4X5 VEL STRL LF (GAUZE/BANDAGES/DRESSINGS) ×2 IMPLANT
BENZOIN TINCTURE PRP APPL 2/3 (GAUZE/BANDAGES/DRESSINGS) ×7 IMPLANT
BIT DRILL SKYLINE 12MM (BIT) IMPLANT
BLADE CLIPPER SURG (BLADE) IMPLANT
BONE CERV LORDOTIC 14.5X12X7 (Bone Implant) ×3 IMPLANT
BUR ROUND FLUTED 4 SOFT TCH (BURR) ×1 IMPLANT
COLLAR CERV LO CONTOUR FIRM DE (SOFTGOODS) ×3 IMPLANT
COLLAR CERV PROCARE ST 2.25 (SOFTGOODS) ×1 IMPLANT
CORDS BIPOLAR (ELECTRODE) ×4 IMPLANT
COVER MAYO STAND STRL (DRAPES) ×5 IMPLANT
COVER SURGICAL LIGHT HANDLE (MISCELLANEOUS) ×4 IMPLANT
CRADLE DONUT ADULT HEAD (MISCELLANEOUS) ×3 IMPLANT
CUFF TOURNIQUET SINGLE 18IN (TOURNIQUET CUFF) IMPLANT
CUFF TOURNIQUET SINGLE 24IN (TOURNIQUET CUFF) IMPLANT
DRAPE C-ARM 42X72 X-RAY (DRAPES) ×3 IMPLANT
DRAPE HALF SHEET 40X57 (DRAPES) ×3 IMPLANT
DRAPE MICROSCOPE LEICA (MISCELLANEOUS) ×3 IMPLANT
DRAPE UNIVERSAL PACK (DRAPES) ×1 IMPLANT
DRILL BIT SKYLINE 12MM (BIT) ×3
DURAPREP 26ML APPLICATOR (WOUND CARE) ×3 IMPLANT
DURAPREP 6ML APPLICATOR 50/CS (WOUND CARE) ×5 IMPLANT
ELECT COATED BLADE 2.86 ST (ELECTRODE) ×4 IMPLANT
ELECT REM PT RETURN 9FT ADLT (ELECTROSURGICAL) ×3
ELECTRODE REM PT RTRN 9FT ADLT (ELECTROSURGICAL) ×2 IMPLANT
EVACUATOR 1/8 PVC DRAIN (DRAIN) ×4 IMPLANT
GAUZE SPONGE 4X4 12PLY STRL (GAUZE/BANDAGES/DRESSINGS) ×4 IMPLANT
GAUZE SPONGE 4X4 12PLY STRL LF (GAUZE/BANDAGES/DRESSINGS) ×2 IMPLANT
GAUZE XEROFORM 1X8 LF (GAUZE/BANDAGES/DRESSINGS) ×4 IMPLANT
GLOVE BIOGEL PI IND STRL 8 (GLOVE) ×4 IMPLANT
GLOVE BIOGEL PI INDICATOR 8 (GLOVE) ×3
GLOVE ORTHO TXT STRL SZ7.5 (GLOVE) ×5 IMPLANT
GOWN STRL REUS W/ TWL LRG LVL3 (GOWN DISPOSABLE) ×2 IMPLANT
GOWN STRL REUS W/ TWL XL LVL3 (GOWN DISPOSABLE) ×2 IMPLANT
GOWN STRL REUS W/TWL 2XL LVL3 (GOWN DISPOSABLE) ×4 IMPLANT
GOWN STRL REUS W/TWL LRG LVL3 (GOWN DISPOSABLE) ×3
GOWN STRL REUS W/TWL XL LVL3 (GOWN DISPOSABLE) ×6
GRAFT BNE SPCR VG2 14.5X12X7 (Bone Implant) IMPLANT
HEAD HALTER (SOFTGOODS) ×3 IMPLANT
KIT BASIN OR (CUSTOM PROCEDURE TRAY) ×3 IMPLANT
KIT TURNOVER KIT B (KITS) ×3 IMPLANT
MANIFOLD NEPTUNE II (INSTRUMENTS) ×2 IMPLANT
NDL 25GX 5/8IN NON SAFETY (NEEDLE) ×2 IMPLANT
NDL HYPO 25GX1X1/2 BEV (NEEDLE) IMPLANT
NEEDLE 25GX 5/8IN NON SAFETY (NEEDLE) ×6 IMPLANT
NEEDLE HYPO 25GX1X1/2 BEV (NEEDLE) ×3 IMPLANT
NS IRRIG 1000ML POUR BTL (IV SOLUTION) ×3 IMPLANT
PACK ORTHO CERVICAL (CUSTOM PROCEDURE TRAY) ×3 IMPLANT
PACK ORTHO EXTREMITY (CUSTOM PROCEDURE TRAY) ×3 IMPLANT
PAD ARMBOARD 7.5X6 YLW CONV (MISCELLANEOUS) ×6 IMPLANT
PATTIES SURGICAL .5 X.5 (GAUZE/BANDAGES/DRESSINGS) IMPLANT
PIN TEMP SKYLINE THREADED (PIN) ×1 IMPLANT
PLATE SKYLINE 12MM (Plate) ×1 IMPLANT
SCREW VARIABLE SELF TAP 12MM (Screw) ×4 IMPLANT
STRIP CLOSURE SKIN 1/2X4 (GAUZE/BANDAGES/DRESSINGS) ×4 IMPLANT
SUCTION FRAZIER HANDLE 10FR (MISCELLANEOUS)
SUCTION TUBE FRAZIER 10FR DISP (MISCELLANEOUS) IMPLANT
SURGIFLO W/THROMBIN 8M KIT (HEMOSTASIS) ×1 IMPLANT
SUT BONE WAX W31G (SUTURE) ×3 IMPLANT
SUT ETHILON 4 0 PS 2 18 (SUTURE) ×4 IMPLANT
SUT VIC AB 3-0 PS2 18 (SUTURE) ×3
SUT VIC AB 3-0 PS2 18XBRD (SUTURE) ×2 IMPLANT
SUT VIC AB 4-0 PS2 27 (SUTURE) ×3 IMPLANT
SYR BULB 3OZ (MISCELLANEOUS) ×2 IMPLANT
TAPE CLOTH SURG 4X10 WHT LF (GAUZE/BANDAGES/DRESSINGS) ×1 IMPLANT
TOWEL OR 17X24 6PK STRL BLUE (TOWEL DISPOSABLE) ×4 IMPLANT
TOWEL OR 17X26 10 PK STRL BLUE (TOWEL DISPOSABLE) ×3 IMPLANT
TUBE CONNECTING 12X1/4 (SUCTIONS) ×1 IMPLANT
WATER STERILE IRR 1000ML POUR (IV SOLUTION) ×2 IMPLANT
YANKAUER SUCT BULB TIP NO VENT (SUCTIONS) ×2 IMPLANT

## 2017-10-05 NOTE — Op Note (Signed)
Preop diagnosis: Cervical spondylosis with HNP C5-6 and right carpal tunnel syndrome  Postop diagnosis: Same  Procedure: C5-6 anterior cervical discectomy and fusion allograft and plate, right carpal tunnel release.  Surgeon Rodell Perna, MD  Assistant Benjiman Core, PA-C medically necessary and present for the entire procedure I will  Anesthesia General plus Marcaine local 6 cc neck and 7 cc carpal tunnel.  Tourniquet time: Less than 15 minutes.  Right arm x250.  Implants: [place] skyline 12 mm plate 12 mm screws x4.VG2 cortical cancellus 7 mm graft.  Procedure: After standard prepping and draping with arms tucked at the side wrist  restraints applied for traction as needed first fluoroscopic visualization head halter traction of been applied without weight.  Sterile female standard head area squared with towels sterile skin marker and prominent skin fold starting at the midline extending to the left Betadine Steri-Drape and thyroid sheets and drapes.  Timeout procedure was completed Ancef prophylaxis.  Incision was made starting at the midline extending to the left.  Platysma was divided in line with skin incision.  Blunt dissection down the level of the longus Coley was performed with esophagus and trachea to the midline.  Carotid sheath sheath was noted pulses palpated.  Lungs Coley were elevated prominent spur at C5-6 was noted short 25 needle placed and crosstable lateral fluoroscopic image confirmed appropriate level.  Intra-spurs were removed and disc was marked by taking a large central disc of the anterior aspect.  Teeth plant retractors placed right and left with plate cephalad caudad.  Discectomy was performed operative microscope was brought in posterior longitudinal he was taken down for complete decompression of the dura.  There is a large amount of protruding disc at present right paracentral consistent with MRI imaging.  Once dura was completely decompressed some Surgi-Flo was used.   Anterior spurs were removed for application of the plate to make sure would sit flat.  A 12 mm plate was selected C-arm was brought and confirmed appropriate position at the midline.  Small spike which was disposed were then placed.  1C arm confirmed good position screws were placed x4 recheck with fluoroscopic images.  Screws were locked down with a tiny screwdriver.  Good position of graft plate and screws.  Irrigation with saline solution.  Hemovac drain placement and out technique in line with the skin incision on the left platysma closed 3-0 Vicryl 4-0 Vicryl subcuticular closure tincture benzoin Steri-Strips 4 x 4's tape soft cervical collar.  Arm was then placed on an arm board on the right proximal tourniquet applied standard prepping with DuraPrep mini timeout was repeated no new antibiotics were needed.  Arm is elevated tourniquet inflated.  Sterile skin marker used an incision was made starting along the ulnar aspect of the median nerve position.  Palmaris brevis transverse carpal ligament was divided.  Median nerve showed hourglass deformity there is chronic tenosynovitis no masses.  Complete release of the fingertip placed proximally distally with good visualization of the palmar fat.  Tourniquet was deflated.  Small arterial bleeder was coagulated and subtenons tissue proximally.  Operative field was dry repeat irrigation closure with nylon sutures 4 oh.  Xeroform 4 x 4's web roll and Ace wrap were applied for postoperative dressing.  Patient tolerated procedure well transferred to room in stable condition.

## 2017-10-05 NOTE — Interval H&P Note (Signed)
History and Physical Interval Note:  10/05/2017 7:28 AM  Jamie Mcgee  has presented today for surgery, with the diagnosis of C5-6 SPONDYLOSIS, STENOSIS, RIGHT CARPAL TUNNEL SYNDROME  The various methods of treatment have been discussed with the patient and family. After consideration of risks, benefits and other options for treatment, the patient has consented to  Procedure(s): C5-6 ANTERIOR CERVICAL DECOMPRESSION/DISCECTOMY FUSION,  ALLOGRAFT, PLATE  (NECK FIRST AND CARPAL TUNNEL RELEASE SECOND) (N/A) RIGHT CARPAL TUNNEL RELEASE (Right) as a surgical intervention .  The patient's history has been reviewed, patient examined, no change in status, stable for surgery.  I have reviewed the patient's chart and labs.  Questions were answered to the patient's satisfaction.     Marybelle Killings

## 2017-10-05 NOTE — Anesthesia Procedure Notes (Addendum)
Procedure Name: Intubation Date/Time: 10/05/2017 7:42 AM Performed by: Leonor Liv, CRNA Pre-anesthesia Checklist: Patient identified, Emergency Drugs available, Suction available and Patient being monitored Patient Re-evaluated:Patient Re-evaluated prior to induction Oxygen Delivery Method: Circle System Utilized Preoxygenation: Pre-oxygenation with 100% oxygen Induction Type: IV induction Ventilation: Mask ventilation without difficulty Laryngoscope Size: Glidescope and 4 Grade View: Grade I Tube type: Oral Tube size: 7.0 mm Number of attempts: 1 Airway Equipment and Method: Stylet,  Oral airway and Bite block Placement Confirmation: ETT inserted through vocal cords under direct vision,  positive ETCO2 and breath sounds checked- equal and bilateral Secured at: 21 cm Tube secured with: Tape Dental Injury: Teeth and Oropharynx as per pre-operative assessment  Comments: Tongue assessed for pressure from OETT or teeth. Bite block in place. Pt reports past history of tongue numbess for 3 weeks after injury from OETT during anesthesia. Glidescope used due to decreased ROM (painful extension)

## 2017-10-05 NOTE — Plan of Care (Signed)
  Problem: Activity: Goal: Ability to avoid complications of mobility impairment will improve Outcome: Progressing Goal: Ability to tolerate increased activity will improve Outcome: Progressing Goal: Will remain free from falls Outcome: Progressing   Problem: Physical Regulation: Goal: Ability to maintain clinical measurements within normal limits will improve Outcome: Progressing Goal: Postoperative complications will be avoided or minimized Outcome: Progressing Goal: Diagnostic test results will improve Outcome: Progressing   Problem: Pain Management: Goal: Pain level will decrease Outcome: Progressing   Problem: Health Behavior/Discharge Planning: Goal: Identification of resources available to assist in meeting health care needs will improve Outcome: Progressing

## 2017-10-05 NOTE — Progress Notes (Signed)
Orthopedic Tech Progress Note Patient Details:  Jamie Mcgee [birth year] [id number]  Ortho Devices Type of Ortho Device: Soft collar Ortho Device/Splint Interventions: Loanne Drilling, Jerrine Urschel 10/05/2017, 2:00 PM

## 2017-10-05 NOTE — Transfer of Care (Signed)
Immediate Anesthesia Transfer of Care Note  Patient: Jamie Mcgee  Procedure(s) Performed: C5-6 ANTERIOR CERVICAL DECOMPRESSION/DISCECTOMY FUSION,  ALLOGRAFT, PLATE  (NECK FIRST AND CARPAL TUNNEL RELEASE SECOND) (N/A ) RIGHT CARPAL TUNNEL RELEASE (Right )  Patient Location: PACU  Anesthesia Type:General  Level of Consciousness: awake, alert  and oriented  Airway & Oxygen Therapy: Patient Spontanous Breathing and Patient connected to nasal cannula oxygen  Post-op Assessment: Report given to RN, Post -op Vital signs reviewed and stable and Patient moving all extremities  Post vital signs: Reviewed and stable  Last Vitals:  Vitals Value Taken Time  BP 108/74 10/05/2017 11:43 AM  Temp    Pulse 75 10/05/2017 11:57 AM  Resp 10 10/05/2017 11:57 AM  SpO2 96 % 10/05/2017 11:57 AM  Vitals shown include unvalidated device data.  Last Pain:  Vitals:   10/05/17 1130  TempSrc:   PainSc: 8          Complications: No apparent anesthesia complications

## 2017-10-05 NOTE — Anesthesia Postprocedure Evaluation (Signed)
Anesthesia Post Note  Patient: FARRELL BROERMAN  Procedure(s) Performed: C5-6 ANTERIOR CERVICAL DECOMPRESSION/DISCECTOMY FUSION,  ALLOGRAFT, PLATE  (NECK FIRST AND CARPAL TUNNEL RELEASE SECOND) (N/A ) RIGHT CARPAL TUNNEL RELEASE (Right )     Patient location during evaluation: PACU Anesthesia Type: General Level of consciousness: awake and alert Pain management: pain level controlled Vital Signs Assessment: post-procedure vital signs reviewed and stable Respiratory status: spontaneous breathing, nonlabored ventilation, respiratory function stable and patient connected to nasal cannula oxygen Cardiovascular status: blood pressure returned to baseline and stable Postop Assessment: no apparent nausea or vomiting Anesthetic complications: no    Last Vitals:  Vitals:   10/05/17 1200 10/05/17 1233  BP:  107/78  Pulse: 76 77  Resp: (!) 9 17  Temp: (!) 36.1 C 36.8 C  SpO2: 96% 93%    Last Pain:  Vitals:   10/05/17 1233  TempSrc: Oral  PainSc:                  Rhen Dossantos,W. EDMOND

## 2017-10-06 DIAGNOSIS — I10 Essential (primary) hypertension: Secondary | ICD-10-CM | POA: Diagnosis not present

## 2017-10-06 DIAGNOSIS — Z79899 Other long term (current) drug therapy: Secondary | ICD-10-CM | POA: Diagnosis not present

## 2017-10-06 DIAGNOSIS — F17210 Nicotine dependence, cigarettes, uncomplicated: Secondary | ICD-10-CM | POA: Diagnosis not present

## 2017-10-06 DIAGNOSIS — M4722 Other spondylosis with radiculopathy, cervical region: Secondary | ICD-10-CM | POA: Diagnosis not present

## 2017-10-06 DIAGNOSIS — K219 Gastro-esophageal reflux disease without esophagitis: Secondary | ICD-10-CM | POA: Diagnosis not present

## 2017-10-06 DIAGNOSIS — J449 Chronic obstructive pulmonary disease, unspecified: Secondary | ICD-10-CM | POA: Diagnosis not present

## 2017-10-06 DIAGNOSIS — Z888 Allergy status to other drugs, medicaments and biological substances status: Secondary | ICD-10-CM | POA: Diagnosis not present

## 2017-10-06 DIAGNOSIS — G5601 Carpal tunnel syndrome, right upper limb: Secondary | ICD-10-CM | POA: Diagnosis not present

## 2017-10-06 DIAGNOSIS — G629 Polyneuropathy, unspecified: Secondary | ICD-10-CM | POA: Diagnosis not present

## 2017-10-06 DIAGNOSIS — F329 Major depressive disorder, single episode, unspecified: Secondary | ICD-10-CM | POA: Diagnosis not present

## 2017-10-06 DIAGNOSIS — I739 Peripheral vascular disease, unspecified: Secondary | ICD-10-CM | POA: Diagnosis not present

## 2017-10-06 NOTE — Evaluation (Signed)
Physical Therapy Evaluation Patient Details Name: Jamie Mcgee MRN: [id number] DOB: Oct 14, 1960 Today's Date: 10/06/2017   History of Present Illness  Patient is a 57 yo female s/p C5-6 anterior cervical discectomy and fusion allograft and plate, right carpal tunnel release.  Clinical Impression  Patient seen for mobility assessment s/p spinal surgery. Mobilizing well. Educated patient on precautions, mobility expectations, safety and car transfers. No further acute PT needs. Will sign off.     Follow Up Recommendations No PT follow up    Equipment Recommendations  None recommended by PT    Recommendations for Other Services       Precautions / Restrictions Precautions Precautions: Cervical Precaution Booklet Issued: Yes (comment) Precaution Comments: hand out provided Required Braces or Orthoses: Cervical Brace Cervical Brace: Soft collar      Mobility  Bed Mobility Overal bed mobility: Modified Independent                Transfers Overall transfer level: Modified independent                  Ambulation/Gait Ambulation/Gait assistance: Modified independent (Device/Increase time) Ambulation Distance (Feet): 300 Feet Assistive device: None Gait Pattern/deviations: Step-through pattern;Decreased stride length Gait velocity: decreased Gait velocity interpretation: <1.8 ft/sec, indicate of risk for recurrent falls General Gait Details: steady with ambulation  Stairs Stairs: Yes Stairs assistance: Supervision Stair Management: One rail Right;Sideways Number of Stairs: 4 General stair comments: educated on technique to go up steps sideways due to [employer]  Wheelchair Mobility    Modified Rankin (Stroke Patients Only)       Balance Overall balance assessment: Mild deficits observed, not formally tested                                           Pertinent Vitals/Pain Pain Assessment: 0-10 Pain Score: 4  Pain Location: neck and  wrist Pain Descriptors / Indicators: Sore;Moaning    Home Living Family/patient expects to be discharged to:: Private residence Living Arrangements: Alone Available Help at Discharge: Family Type of Home: House Home Access: Stairs to enter Entrance Stairs-Rails: Right Entrance Stairs-Number of Steps: 4 Home Layout: One level Home Equipment: None      Prior Function Level of Independence: Independent               Hand Dominance   Dominant Hand: Right    Extremity/Trunk Assessment   Upper Extremity Assessment Upper Extremity Assessment: RUE deficits/detail    Lower Extremity Assessment Lower Extremity Assessment: Overall WFL for tasks assessed       Communication   Communication: No difficulties  Cognition Arousal/Alertness: Awake/alert Behavior During Therapy: WFL for tasks assessed/performed Overall Cognitive Status: Within Functional Limits for tasks assessed                                        General Comments      Exercises     Assessment/Plan    PT Assessment Patent does not need any further PT services  PT Problem List         PT Treatment Interventions      PT Goals (Current goals can be found in the Care Plan section)  Acute Rehab PT Goals PT Goal Formulation: All assessment and education complete, DC therapy  Frequency     Barriers to discharge        Co-evaluation               AM-PAC PT "6 Clicks" Daily Activity  Outcome Measure Difficulty turning over in bed (including adjusting bedclothes, sheets and blankets)?: A Little Difficulty moving from lying on back to sitting on the side of the bed? : A Little Difficulty sitting down on and standing up from a chair with arms (e.g., wheelchair, bedside commode, etc,.)?: A Little Help needed moving to and from a bed to chair (including a wheelchair)?: A Little Help needed walking in hospital room?: A Little Help needed climbing 3-5 steps with a railing? : A  Little 6 Click Score: 18    End of Session Equipment Utilized During Treatment: Cervical collar Activity Tolerance: Patient tolerated treatment well Patient left: in bed;with call bell/phone within reach Nurse Communication: Mobility status PT Visit Diagnosis: Difficulty in walking, not elsewhere classified (R26.2)    Time: 7741-2878 PT Time Calculation (min) (ACUTE ONLY): 17 min   Charges:   PT Evaluation $PT Eval Low Complexity: 1 Low     PT G Codes:        Alben Deeds, PT DPT  Board Certified Neurologic Specialist [place] 10/06/2017, 7:50 AM

## 2017-10-06 NOTE — Progress Notes (Signed)
Occupational Therapy Evaluation and Discharge Patient Details Name: Jamie Mcgee MRN: [id number] DOB: Oct 28, 1960 Today's Date: 10/06/2017    History of Present Illness Patient is a 57 yo female s/p C5-6 anterior cervical discectomy and fusion allograft and plate, right carpal tunnel release.   Clinical Impression   PTA Pt independent in ADL/IADL and mobility. 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 Pt is currently overall min A for ADL. Educated on elevation, exercises (moving digits and elbow) and ice for edema management in RUE, as well as compensatory strategies for ADL with cervical cx (handout reviewed in full). Pt able to don/doff brace independently after education. Educated on shower, and using seat not only for safety but for function with doing things one handed. Also problem solved caring for her dogs. Pt will have assist from her friend who will be staying with her at dc. All questions/concerns answered, OT education complete. OT to sign off, thank you for the opportunity to serve this patient.     Follow Up Recommendations  No OT follow up;Supervision - Intermittent    Equipment Recommendations  None recommended by OT    Recommendations for Other Services       Precautions / Restrictions Precautions Precautions: Cervical Precaution Booklet Issued: Yes (comment) Precaution Comments: hand out provided Required Braces or Orthoses: Cervical Brace Cervical Brace: Soft collar Restrictions Weight Bearing Restrictions: No      Mobility Bed Mobility Overal bed mobility: Modified Independent                Transfers Overall transfer level: Modified independent                    Balance Overall balance assessment: Mild deficits observed, not formally tested                                         ADL either performed or assessed with clinical judgement   ADL Overall ADL's : Needs  assistance/impaired Eating/Feeding: Modified independent Eating/Feeding Details (indicate cue type and reason): able to use RUE to assist with BUE tasks Grooming: Modified independent;Standing   Upper Body Bathing: Minimal assistance Upper Body Bathing Details (indicate cue type and reason): for back Lower Body Bathing: Supervison/ safety Lower Body Bathing Details (indicate cue type and reason): able to bring feet cross to knees Upper Body Dressing : Min guard;Cueing for compensatory techniques Upper Body Dressing Details (indicate cue type and reason): able to don/doff cervical collar, educated on sequencing/comepensatory strategies Lower Body Dressing: Min guard;Sit to/from stand Lower Body Dressing Details (indicate cue type and reason): able to cross feet to knees - also instructed on reacher/grabber Toilet Transfer: [place] and Hygiene: Min guard;Sit to/from stand   Tub/ Shower Transfer: Tub transfer;Supervision/safety;Ambulation;Shower Scientist, research (medical) Details (indicate cue type and reason): has a shower seat and hand held shower head; educated on safety/compensatory strategies Functional mobility during ADLs: Modified independent General ADL Comments: Pt has BFF that will be staying with her for a few days     Vision Patient Visual Report: No change from baseline Vision Assessment?: No apparent visual deficits     Perception     Praxis      Pertinent Vitals/Pain Pain Assessment: 0-10 Pain Score: 4  Pain Location: neck and wrist Pain Descriptors / Indicators: Moaning;Grimacing Pain Intervention(s): Monitored  during session;Repositioned     Hand Dominance Right   Extremity/Trunk Assessment Upper Extremity Assessment Upper Extremity Assessment: RUE deficits/detail RUE Deficits / Details: post-op deficits due to immobilization RUE: Unable to fully assess due to immobilization   Lower Extremity Assessment Lower  Extremity Assessment: Overall WFL for tasks assessed   Cervical / Trunk Assessment Cervical / Trunk Assessment: Other exceptions Cervical / Trunk Exceptions: s/p cervical sx   Communication Communication Communication: No difficulties   Cognition Arousal/Alertness: Awake/alert Behavior During Therapy: WFL for tasks assessed/performed Overall Cognitive Status: Within Functional Limits for tasks assessed                                     General Comments  problem solved taking care of her dogs as well, also educated on RUE exercises/ROM for digits/elevation for edema management    Exercises     Shoulder Instructions      Home Living Family/patient expects to be discharged to:: Private residence Living Arrangements: Alone Available Help at Discharge: Family Type of Home: House Home Access: Stairs to enter Technical brewer of Steps: 4 Entrance Stairs-Rails: Right Home Layout: One level     Bathroom Shower/Tub: Teacher, early years/pre: Standard     Home Equipment: Shower seat;Hand held shower head;Adaptive equipment Adaptive Equipment: Reacher Additional Comments: 2 small dogs      Prior Functioning/Environment Level of Independence: Independent        Comments: 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, drives        OT Problem List:        OT Treatment/Interventions:      OT Goals(Current goals can be found in the care plan section) Acute Rehab OT Goals Patient Stated Goal: to get home and get independent again OT Goal Formulation: With patient Time For Goal Achievement: 10/20/17 Potential to Achieve Goals: Good  OT Frequency:     Barriers to D/C:            Co-evaluation              AM-PAC PT "6 Clicks" Daily Activity     Outcome Measure Help from another person eating meals?: None Help from another person taking care of personal grooming?: None Help from another person toileting, which  includes using toliet, bedpan, or urinal?: None Help from another person bathing (including washing, rinsing, drying)?: A Little Help from another person to put on and taking off regular upper body clothing?: A Little Help from another person to put on and taking off regular lower body clothing?: None 6 Click Score: 22   End of Session Equipment Utilized During Treatment: Cervical collar Nurse Communication: Mobility status  Activity Tolerance: Patient tolerated treatment well Patient left: in bed;with call bell/phone within reach                   Time: 0805-0825 OT Time Calculation (min): 20 min Charges:  OT General Charges $OT Visit: 1 Visit OT Evaluation $OT Eval Moderate Complexity: 1 Mod G-Codes:     Hulda Humphrey OTR/L [place] 10/06/2017, 9:29 AM

## 2017-10-06 NOTE — Discharge Summary (Signed)
Physician Discharge Summary      Patient ID: Jamie Mcgee MRN: [id number] DOB/AGE: 01-15-1961 57 y.o.  Admit date: 10/05/2017 Discharge date: 10/06/2017  Admission Diagnoses:  <principal problem not specified>  Discharge Diagnoses:  Active Problems:   Cervical spinal stenosis   Past Medical History:  Diagnosis Date  . Carpal tunnel syndrome on right   . Cervical stenosis of spine    C5-6  . COPD (chronic obstructive pulmonary disease) ([place])    Gold III  . Depression   . Fibromuscular dysplasia of renal artery ([place]) 2006  . GERD (gastroesophageal reflux disease)   . Hepatitis    type A in 1977  . HNP (herniated nucleus pulposus), cervical   . HTN (hypertension)   . Neuropathy     Surgeries: Procedure(s): C5-6 ANTERIOR CERVICAL DECOMPRESSION/DISCECTOMY FUSION,  ALLOGRAFT, PLATE  (NECK FIRST AND CARPAL TUNNEL RELEASE SECOND) RIGHT CARPAL TUNNEL RELEASE on 10/05/2017   Consultants (if any):   Discharged Condition: Improved  Hospital Course: Jamie Mcgee is an 57 y.o. female who was admitted 10/05/2017 with a diagnosis of <principal problem not specified> and went to the operating room on 10/05/2017 and underwent the above named procedures.    She was given perioperative antibiotics:  Anti-infectives (From admission, onward)   Start     Dose/Rate Route Frequency Ordered Stop   10/05/17 1530  ceFAZolin (ANCEF) IVPB 1 g/50 mL premix     1 g 100 mL/hr over 30 Minutes Intravenous Every 8 hours 10/05/17 1230 10/05/17 2333   10/05/17 0628  ceFAZolin (ANCEF) 2-4 GM/100ML-% IVPB    Note to Pharmacy:  Maryjean Ka   : cabinet override      10/05/17 0628 10/05/17 0735   10/05/17 0624  ceFAZolin (ANCEF) IVPB 2g/100 mL premix     2 g 200 mL/hr over 30 Minutes Intravenous On call to O.R. 10/05/17 4098 10/05/17 0805    .  She was given sequential compression devices, early ambulation for DVT prophylaxis.  She benefited maximally from the hospital stay and there were no  complications.    Recent vital signs:  Vitals:   10/06/17 0745 10/06/17 0754  BP: 116/70   Pulse: 63 76  Resp: 18 18  Temp: 97.9 F (36.6 C)   SpO2: 97% 96%    Recent laboratory studies:  Lab Results  Component Value Date   HGB 15.3 (H) 10/03/2017   HGB 14.7 07/16/2017   HGB 14.9 07/03/2017   Lab Results  Component Value Date   WBC 11.7 (H) 10/03/2017   PLT 292 10/03/2017   No results found for: INR Lab Results  Component Value Date   NA 138 10/03/2017   K 3.3 (L) 10/03/2017   CL 96 (L) 10/03/2017   CO2 28 10/03/2017   BUN 9 10/03/2017   CREATININE 0.87 10/03/2017   GLUCOSE 99 10/03/2017    Discharge Medications:   Allergies as of 10/06/2017      Reactions   Lipitor [atorvastatin] Other (See Comments)   Muscle aches and nausea   Naproxen Nausea And Vomiting   Stomach pain      Medication List    STOP taking these medications   acetaminophen 650 MG CR tablet Commonly known as:  TYLENOL   traMADol 50 MG tablet Commonly known as:  ULTRAM     TAKE these medications   carvedilol 6.25 MG tablet Commonly known as:  COREG TAKE 1 TABLET BY MOUTH TWICE DAILY WITH A MEAL   citalopram 20 MG  tablet Commonly known as:  CELEXA TAKE 1 TABLET BY MOUTH ONCE DAILY What changed:    how much to take  how to take this  when to take this   fexofenadine 180 MG tablet Commonly known as:  ALLEGRA Take 180 mg by mouth daily.   gabapentin 800 MG tablet Commonly known as:  NEURONTIN TAKE 1 TABLET BY MOUTH THREE TIMES DAILY   hydrochlorothiazide 25 MG tablet Commonly known as:  HYDRODIURIL TAKE 1 TABLET BY MOUTH ONCE DAILY   losartan 100 MG tablet Commonly known as:  COZAAR TAKE 1 TABLET BY MOUTH ONCE DAILY   methocarbamol 500 MG tablet Commonly known as:  ROBAXIN Take 1 tablet (500 mg total) by mouth every 8 (eight) hours as needed for muscle spasms.   mometasone-formoterol 200-5 MCG/ACT Aero Commonly known as:  DULERA Inhale 2 puffs into the lungs 2  (two) times daily.   nicotine polacrilex 4 MG gum Commonly known as:  NICORETTE Take 4 mg by mouth as needed for smoking cessation.   nitroGLYCERIN 0.4 MG SL tablet Commonly known as:  NITROSTAT Place 1 tablet (0.4 mg total) under the tongue every 5 (five) minutes as needed.   olmesartan 20 MG tablet Commonly known as:  BENICAR Take 1 tablet (20 mg total) by mouth daily.   omeprazole 20 MG tablet Commonly known as:  PRILOSEC OTC Take 20 mg by mouth daily.   oxyCODONE-acetaminophen 5-325 MG tablet Commonly known as:  PERCOCET/ROXICET Take 1-2 tablets by mouth every 6 (six) hours as needed for severe pain.       Diagnostic Studies: Dg Chest 2 View  Result Date: 09/18/2017 CLINICAL DATA:  Preop cervical fusion and carpal tunnel. EXAM: CHEST - 2 VIEW COMPARISON:  07/03/2017. FINDINGS: The heart size and mediastinal contours are within normal limits. Both lungs are clear. The visualized skeletal structures are unremarkable. Improved aeration compared with priors. IMPRESSION: No active cardiopulmonary disease. Electronically Signed   By: Staci Righter M.D.   On: 09/18/2017 16:46   Dg Cervical Spine 2-3 Views  Result Date: 10/05/2017 CLINICAL DATA:  Status post anterior fusion EXAM: DG C-ARM 61-120 MIN; CERVICAL SPINE - 2-3 VIEW COMPARISON:  Cervical MRI August 20, 2017 FLUOROSCOPY TIME:  0 minutes 6 seconds; 3 acquired images FINDINGS: Frontal and lateral views obtained. There is anterior screw and plate fixation at C5 and C6 with disc spacer at C5-6. No fracture or spondylolisthesis evident. There is moderate disc space narrowing at C6-7. Other disc spaces appear unremarkable. IMPRESSION: Postoperative change at C5 and C6 with support hardware intact. No fracture or spondylolisthesis. The C5-6 disc is normal in height. There is disc space narrowing at C6-7. Electronically Signed   By: Lowella Grip III M.D.   On: 10/05/2017 09:55   Dg C-arm 1-60 Min  Result Date: 10/05/2017 CLINICAL  DATA:  Status post anterior fusion EXAM: DG C-ARM 61-120 MIN; CERVICAL SPINE - 2-3 VIEW COMPARISON:  Cervical MRI August 20, 2017 FLUOROSCOPY TIME:  0 minutes 6 seconds; 3 acquired images FINDINGS: Frontal and lateral views obtained. There is anterior screw and plate fixation at C5 and C6 with disc spacer at C5-6. No fracture or spondylolisthesis evident. There is moderate disc space narrowing at C6-7. Other disc spaces appear unremarkable. IMPRESSION: Postoperative change at C5 and C6 with support hardware intact. No fracture or spondylolisthesis. The C5-6 disc is normal in height. There is disc space narrowing at C6-7. Electronically Signed   By: Lowella Grip III M.D.   On:  10/05/2017 09:55    Disposition: Discharge disposition: 01-Home or Self Care       Discharge Instructions    Call MD / Call 911   Complete by:  As directed    If you experience chest pain or shortness of breath, CALL 911 and be transported to the hospital emergency room.  If you develope a fever above 101.5 F, pus (white drainage) or increased drainage or redness at the wound, or calf pain, call your surgeon's office.   Constipation Prevention   Complete by:  As directed    Drink plenty of fluids.  Prune juice may be helpful.  You may use a stool softener, such as Colace (over the counter) 100 mg twice a day.  Use MiraLax (over the counter) for constipation as needed.   Driving restrictions   Complete by:  As directed    No driving while taking narcotic pain meds.   Increase activity slowly as tolerated   Complete by:  As directed       Follow-up Information    Schedule an appointment as soon as possible for a visit with Marybelle Killings, MD.   Specialty:  Orthopedic Surgery Why:  need return office visit one week postop Contact information: [place] [address] [phone]            Signed: Eduard Roux 10/06/2017, 9:30 AM

## 2017-10-06 NOTE — Progress Notes (Signed)
Patient alert and oriented, mae's well, voiding adequate amount of urine, swallowing without difficulty,  c/o pain and medication given at time of discharge. Patient discharged home with family. Script and discharged instructions given to patient. Patient and family stated understanding of instructions given. Patient has an appointment with Dr. Lorin Mercy

## 2017-10-06 NOTE — Progress Notes (Signed)
Cleared PT this morning.   Doing well, pain controlled, ready for dc home today Rx in chart. NVI

## 2017-10-08 ENCOUNTER — Encounter (HOSPITAL_COMMUNITY): Payer: Self-pay | Admitting: Orthopaedic Surgery

## 2017-10-11 ENCOUNTER — Ambulatory Visit (INDEPENDENT_AMBULATORY_CARE_PROVIDER_SITE_OTHER): Payer: BLUE CROSS/BLUE SHIELD | Admitting: Orthopaedic Surgery

## 2017-10-11 ENCOUNTER — Encounter (INDEPENDENT_AMBULATORY_CARE_PROVIDER_SITE_OTHER): Payer: Self-pay | Admitting: Orthopaedic Surgery

## 2017-10-11 VITALS — BP 107/66 | HR 77 | Ht 65.0 in | Wt 201.0 lb

## 2017-10-11 DIAGNOSIS — Z981 Arthrodesis status: Secondary | ICD-10-CM

## 2017-10-11 NOTE — Progress Notes (Signed)
Postop C5-6 ACDF allograft and plate and right carpal tunnel release.  She has a wrist splint at home extra collar coverage given she has a second collar for showering.  She still taking some pain medication 1 every 6 hours still has about half a bottle.  Return in 1 week for suture removal right hand and AP lateral C-spine x-ray and collar.

## 2017-10-18 ENCOUNTER — Ambulatory Visit (INDEPENDENT_AMBULATORY_CARE_PROVIDER_SITE_OTHER): Payer: Self-pay

## 2017-10-18 ENCOUNTER — Ambulatory Visit (INDEPENDENT_AMBULATORY_CARE_PROVIDER_SITE_OTHER): Payer: BLUE CROSS/BLUE SHIELD | Admitting: Orthopaedic Surgery

## 2017-10-18 ENCOUNTER — Encounter (INDEPENDENT_AMBULATORY_CARE_PROVIDER_SITE_OTHER): Payer: Self-pay | Admitting: Orthopaedic Surgery

## 2017-10-18 VITALS — BP 122/65 | HR 73 | Ht 65.0 in | Wt 201.0 lb

## 2017-10-18 DIAGNOSIS — Z981 Arthrodesis status: Secondary | ICD-10-CM

## 2017-10-18 NOTE — Progress Notes (Signed)
Post-Op Visit Note   Patient: Jamie Mcgee           Date of Birth: 27-Nov-1960           MRN: [id number] Visit Date: 10/18/2017 PCP: [name], DO   Assessment & Plan: Post C5 6 Fusion Right Carpal Tunnel release.  Suture removal done today right carpal tunnel release incision looks good.  Chief Complaint:  Chief Complaint  Patient presents with  . Neck - Routine Post Op  . Right Wrist - Routine Post Op   Visit Diagnoses:  1. History of fusion of cervical spine     Plan:  Return in 4  Weeks for flexion-extension lateral C-spine x-ray. Follow-Up Instructions: No follow-ups on file.   Orders:  Orders Placed This Encounter  Procedures  . XR Cervical Spine 2 or 3 views   No orders of the defined types were placed in this encounter.   Imaging: Xr Cervical Spine 2 Or 3 Views  Result Date: 10/18/2017 AP and lateral cervical spine x-rays are obtained and reviewed fusion allograft and plate satisfactory position. Impression: C5-6 fusion satisfactory position graft, plate and screws.   PMFS History: Patient Active Problem List   Diagnosis Date Noted  . Cervical spinal stenosis 10/05/2017  . Preoperative clearance 09/25/2017  . Other spondylosis with radiculopathy, cervical region 08/23/2017  . COPD  GOLD III 07/12/2017  . Osteopenia 11/22/2016  . Pre-diabetes 12/25/2015  . Obesity (BMI 30-39.9) 12/25/2015  . Cramp of both lower extremities 07/02/2015  . GERD (gastroesophageal reflux disease) 05/07/2015  . Vitamin D deficiency 05/07/2015  . Metabolic syndrome 93/71/6967  . Post-menopausal bleeding 05/07/2015  . Cigarette smoker 03/08/2015  . Depressed 12/19/2013  . Essential hypertension, benign 12/19/2013   Past Medical History:  Diagnosis Date  . Carpal tunnel syndrome on right   . Cervical stenosis of spine    C5-6  . COPD (chronic obstructive pulmonary disease) ([place])    Gold III  . Depression   . Fibromuscular dysplasia of renal artery ([place])  2006  . GERD (gastroesophageal reflux disease)   . Hepatitis    type A in 1977  . HNP (herniated nucleus pulposus), cervical   . HTN (hypertension)   . Neuropathy     Family History  Problem Relation Age of Onset  . Coronary artery disease Father        States congenital abnormality  . Diabetes Father   . Hyperlipidemia Father   . CAD Sister        Stents   . Cancer Sister        breast  . Cancer Mother        breast  . Cancer Maternal Aunt        breast  . Cancer Maternal Grandmother        breast  . Anesthesia problems Neg Hx   . Hypotension Neg Hx   . Malignant hyperthermia Neg Hx   . Pseudochol deficiency Neg Hx     Past Surgical History:  Procedure Laterality Date  . ANTERIOR CERVICAL DECOMP/DISCECTOMY FUSION N/A 10/05/2017   Procedure: C5-6 ANTERIOR CERVICAL DECOMPRESSION/DISCECTOMY FUSION,  ALLOGRAFT, PLATE  (NECK FIRST AND CARPAL TUNNEL RELEASE SECOND);  Surgeon: Marybelle Killings, MD;  Location: [facility];  Service: Orthopedics;  Laterality: N/A;  . APPENDECTOMY  1993  . BREAST BIOPSY    . CARPAL TUNNEL RELEASE Right 10/05/2017   Procedure: RIGHT CARPAL TUNNEL RELEASE;  Surgeon: Marybelle Killings, MD;  [facility]  OR;  Service: Orthopedics;  Laterality: Right;  . CATARACT EXTRACTION  08-2010   left   . CATARACT EXTRACTION W/PHACO  06/01/2011   Procedure: CATARACT EXTRACTION PHACO AND INTRAOCULAR LENS PLACEMENT (IOC);  Surgeon: Tonny Branch;  Location: [facility];  Service: Ophthalmology;  Laterality: Right;  CDE:9.62  . CHOLECYSTECTOMY    . RENAL ARTERY ANGIOPLASTY  2006   right  . TUBAL LIGATION  1984   [facility]   Social History   Occupational History  . Not on file  Tobacco Use  . Smoking status: Former Smoker    Packs/day: 0.50    Years: 30.00    Pack years: 15.00    Types: Cigarettes    Last attempt to quit: 10/01/2017    Years since quitting: 0.0  . Smokeless tobacco: Never Used  . Tobacco comment: pt quit smoking 2016 for 7 months but restarted last year  Substance  and Sexual Activity  . Alcohol use: No    Alcohol/week: 0.0 oz  . Drug use: Yes    Frequency: 0.5 times per week    Types: Marijuana  . Sexual activity: Yes    Birth control/protection: None, Post-menopausal

## 2017-11-15 ENCOUNTER — Ambulatory Visit (INDEPENDENT_AMBULATORY_CARE_PROVIDER_SITE_OTHER): Payer: BLUE CROSS/BLUE SHIELD

## 2017-11-15 ENCOUNTER — Encounter (INDEPENDENT_AMBULATORY_CARE_PROVIDER_SITE_OTHER): Payer: Self-pay | Admitting: Orthopaedic Surgery

## 2017-11-15 ENCOUNTER — Ambulatory Visit (INDEPENDENT_AMBULATORY_CARE_PROVIDER_SITE_OTHER): Payer: BLUE CROSS/BLUE SHIELD | Admitting: Orthopaedic Surgery

## 2017-11-15 VITALS — BP 116/74 | HR 77 | Ht 64.0 in | Wt 200.0 lb

## 2017-11-15 DIAGNOSIS — Z981 Arthrodesis status: Secondary | ICD-10-CM | POA: Diagnosis not present

## 2017-11-15 DIAGNOSIS — Z9889 Other specified postprocedural states: Secondary | ICD-10-CM

## 2017-11-15 NOTE — Progress Notes (Signed)
Post-Op Visit Note   Patient: Jamie BELONGIA           Date of Birth: [birth year]           MRN: [id number] Visit Date: 11/15/2017 PCP: [name], DO   Assessment & Plan: Patient returns 6 weeks post C5-6 ACDF and right carpal tunnel release.  Incision looks good she still has some tenderness at the carpal tunnel incision.  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  Chief Complaint:  Chief Complaint  Patient presents with  . Neck - Routine Post Op  . Right Wrist - Routine Post Op   Visit Diagnoses:  1. History of fusion of cervical spine   2. History of carpal tunnel release     Plan: Work slip given for work resumption on June 24.  X-rays show satisfactory healing.  She still has some soreness in her shoulders.  She can discontinue the collar.  She will return if she is having persistent symptoms.  Follow-Up Instructions: No follow-ups on file.   Orders:  No orders of the defined types were placed in this encounter.  No orders of the defined types were placed in this encounter.   Imaging: No results found.  PMFS History: Patient Active Problem List   Diagnosis Date Noted  . History of carpal tunnel release 11/15/2017  . History of fusion of cervical spine 10/18/2017  . Preoperative clearance 09/25/2017  . COPD  GOLD III 07/12/2017  . Osteopenia 11/22/2016  . Pre-diabetes 12/25/2015  . Obesity (BMI 30-39.9) 12/25/2015  . Cramp of both lower extremities 07/02/2015  . GERD (gastroesophageal reflux disease) 05/07/2015  . Vitamin D deficiency 05/07/2015  . Metabolic syndrome 78/29/5621  . Post-menopausal bleeding 05/07/2015  . Cigarette smoker 03/08/2015  . Depressed 12/19/2013  . Essential hypertension, benign 12/19/2013   Past Medical History:  Diagnosis Date  . Carpal tunnel syndrome on right   . Cervical stenosis of spine    C5-6  . COPD (chronic obstructive pulmonary disease) ([place])    Gold III  . Depression   .  Fibromuscular dysplasia of renal artery ([place]) 2006  . GERD (gastroesophageal reflux disease)   . Hepatitis    type A in 1977  . HNP (herniated nucleus pulposus), cervical   . HTN (hypertension)   . Neuropathy     Family History  Problem Relation Age of Onset  . Coronary artery disease Father        States congenital abnormality  . Diabetes Father   . Hyperlipidemia Father   . CAD Sister        Stents   . Cancer Sister        breast  . Cancer Mother        breast  . Cancer Maternal Aunt        breast  . Cancer Maternal Grandmother        breast  . Anesthesia problems Neg Hx   . Hypotension Neg Hx   . Malignant hyperthermia Neg Hx   . Pseudochol deficiency Neg Hx     Past Surgical History:  Procedure Laterality Date  . ANTERIOR CERVICAL DECOMP/DISCECTOMY FUSION N/A 10/05/2017   Procedure: C5-6 ANTERIOR CERVICAL DECOMPRESSION/DISCECTOMY FUSION,  ALLOGRAFT, PLATE  (NECK FIRST AND CARPAL TUNNEL RELEASE SECOND);  Surgeon: Marybelle Killings, MD;  Location: [facility];  Service: Orthopedics;  Laterality: N/A;  . APPENDECTOMY  1993  . BREAST BIOPSY    . CARPAL  TUNNEL RELEASE Right 10/05/2017   Procedure: RIGHT CARPAL TUNNEL RELEASE;  Surgeon: Marybelle Killings, MD;  Location: [facility];  Service: Orthopedics;  Laterality: Right;  . CATARACT EXTRACTION  08-2010   left   . CATARACT EXTRACTION W/PHACO  06/01/2011   Procedure: CATARACT EXTRACTION PHACO AND INTRAOCULAR LENS PLACEMENT (IOC);  Surgeon: Tonny Branch;  [facility]: [facility];  Service: Ophthalmology;  Laterality: Right;  CDE:9.62  . CHOLECYSTECTOMY    . RENAL ARTERY ANGIOPLASTY  2006   right  . TUBAL LIGATION  1984   [facility]   Social History   Occupational History  . Not on file  Tobacco Use  . Smoking status: Former Smoker    Packs/day: 0.50    Years: 30.00    Pack years: 15.00    Types: Cigarettes    Last attempt to quit: 10/01/2017    Years since quitting: 0.1  . Smokeless tobacco: Never Used  . Tobacco comment: pt quit smoking 2016 for 7  months but restarted last year  Substance and Sexual Activity  . Alcohol use: No    Alcohol/week: 0.0 oz  . Drug use: Yes    Frequency: 0.5 times per week    Types: Marijuana  . Sexual activity: Yes    Birth control/protection: None, Post-menopausal

## 2017-11-24 ENCOUNTER — Other Ambulatory Visit: Payer: Self-pay | Admitting: Family

## 2017-11-30 DIAGNOSIS — M722 Plantar fascial fibromatosis: Secondary | ICD-10-CM | POA: Diagnosis not present

## 2017-11-30 DIAGNOSIS — M7732 Calcaneal spur, left foot: Secondary | ICD-10-CM | POA: Diagnosis not present

## 2017-11-30 DIAGNOSIS — M7731 Calcaneal spur, right foot: Secondary | ICD-10-CM | POA: Diagnosis not present

## 2017-11-30 DIAGNOSIS — G5762 Lesion of plantar nerve, left lower limb: Secondary | ICD-10-CM | POA: Diagnosis not present

## 2017-11-30 DIAGNOSIS — G5761 Lesion of plantar nerve, right lower limb: Secondary | ICD-10-CM | POA: Diagnosis not present

## 2017-12-14 DIAGNOSIS — M71572 Other bursitis, not elsewhere classified, left ankle and foot: Secondary | ICD-10-CM | POA: Diagnosis not present

## 2017-12-14 DIAGNOSIS — G5763 Lesion of plantar nerve, bilateral lower limbs: Secondary | ICD-10-CM | POA: Diagnosis not present

## 2017-12-14 DIAGNOSIS — M71571 Other bursitis, not elsewhere classified, right ankle and foot: Secondary | ICD-10-CM | POA: Diagnosis not present

## 2017-12-14 DIAGNOSIS — M722 Plantar fascial fibromatosis: Secondary | ICD-10-CM | POA: Diagnosis not present

## 2017-12-20 DIAGNOSIS — M722 Plantar fascial fibromatosis: Secondary | ICD-10-CM | POA: Diagnosis not present

## 2017-12-20 DIAGNOSIS — G5763 Lesion of plantar nerve, bilateral lower limbs: Secondary | ICD-10-CM | POA: Diagnosis not present

## 2017-12-20 DIAGNOSIS — M71571 Other bursitis, not elsewhere classified, right ankle and foot: Secondary | ICD-10-CM | POA: Diagnosis not present

## 2017-12-20 DIAGNOSIS — M71572 Other bursitis, not elsewhere classified, left ankle and foot: Secondary | ICD-10-CM | POA: Diagnosis not present

## 2017-12-28 ENCOUNTER — Encounter: Payer: Self-pay | Admitting: Internal Medicine

## 2017-12-28 ENCOUNTER — Ambulatory Visit (INDEPENDENT_AMBULATORY_CARE_PROVIDER_SITE_OTHER): Payer: BLUE CROSS/BLUE SHIELD | Admitting: Internal Medicine

## 2017-12-28 VITALS — BP 124/74 | HR 63 | Ht 64.0 in | Wt 206.6 lb

## 2017-12-28 DIAGNOSIS — I10 Essential (primary) hypertension: Secondary | ICD-10-CM

## 2017-12-28 DIAGNOSIS — J449 Chronic obstructive pulmonary disease, unspecified: Secondary | ICD-10-CM | POA: Diagnosis not present

## 2017-12-28 DIAGNOSIS — F17210 Nicotine dependence, cigarettes, uncomplicated: Secondary | ICD-10-CM | POA: Diagnosis not present

## 2017-12-28 DIAGNOSIS — G4710 Hypersomnia, unspecified: Secondary | ICD-10-CM | POA: Diagnosis not present

## 2017-12-28 LAB — PULMONARY FUNCTION TEST
DL/VA % pred: 100 %
DL/VA: 4.8 ml/min/mmHg/L
DLCO UNC % PRED: 80 %
DLCO UNC: 19.47 ml/min/mmHg
FEF 25-75 Post: 0.96 L/sec
FEF 25-75 Pre: 0.93 L/sec
FEF2575-%CHANGE-POST: 2 %
FEF2575-%PRED-POST: 39 %
FEF2575-%Pred-Pre: 38 %
FEV1-%CHANGE-POST: 0 %
FEV1-%PRED-POST: 54 %
FEV1-%PRED-PRE: 54 %
FEV1-POST: 1.41 L
FEV1-Pre: 1.41 L
FEV1FVC-%Change-Post: 2 %
FEV1FVC-%PRED-PRE: 91 %
FEV6-%Change-Post: -2 %
FEV6-%PRED-PRE: 59 %
FEV6-%Pred-Post: 58 %
FEV6-PRE: 1.94 L
FEV6-Post: 1.9 L
FEV6FVC-%PRED-POST: 103 %
FEV6FVC-%PRED-PRE: 103 %
FVC-%CHANGE-POST: -2 %
FVC-%PRED-POST: 56 %
FVC-%PRED-PRE: 57 %
FVC-POST: 1.9 L
FVC-PRE: 1.94 L
PRE FEV6/FVC RATIO: 100 %
Post FEV1/FVC ratio: 74 %
Post FEV6/FVC ratio: 100 %
Pre FEV1/FVC ratio: 73 %
RV % PRED: 157 %
RV: 3.06 L
TLC % pred: 103 %
TLC: 5.2 L

## 2017-12-28 NOTE — Assessment & Plan Note (Signed)
Spirometry 07/12/2017  FEV1 1.01 (38%)  Ratio 65 with mod curvature   - 07/12/2017  After extensive coaching inhaler device  effectiveness =  75% > try dulera 200 2bid and off acei   - PFT's  12/28/2017  FEV1 1.41  (54 % ) ratio 74  p 0 % improvement from saba p nothing prior to study with DLCO  80 % corrects to 100 % for alv volume  And  ERV 15%   - The proper method of use, as well as anticipated side effects, of a metered-dose inhaler are discussed and demonstrated to the patient.   Doing better on dulera 200 2bid which probably covers the small asthmatic component Needs to work hard on smoking cessation/ wt loss and f/u before the end of the year.   I had an extended discussion with the patient reviewing all relevant studies completed to date and  lasting 15 to 20 minutes of a 25 minute visit    See device teaching which extended face to face time for this visit.  Each maintenance medication was reviewed in detail including emphasizing most importantly the difference between maintenance and prns and under what circumstances the prns are to be triggered using an action plan format that is not reflected in the computer generated alphabetically organized AVS which I have not found useful in most complex patients, especially with respiratory illnesses  Please see AVS for specific instructions unique to this visit that I personally wrote and verbalized to the the pt in detail and then reviewed with pt  by my nurse highlighting any  changes in therapy recommended at today's visit to their plan of care.

## 2017-12-28 NOTE — Patient Instructions (Signed)
The key is to stop smoking completely before smoking completely stops you!   No change in medications  Please see patient coordinator before you leave today  to schedule split night sleep study   Please schedule a follow up visit in 5  months but call sooner if needed

## 2017-12-28 NOTE — Progress Notes (Signed)
Subjective:     Patient ID: Jamie Mcgee, female   DOB: 11-20-1960,    MRN: [id number]  HPI   28 yowf active smoker with onset of sinuses problems year round starting late 20s  Better with allegra with pattern of recurrent bronchitis winters typically about every year since around 2012 req short term saba and no maint rx referred to pulmonary clinic 07/12/2017 by Jamie Mcgee s/p admit:  Admit date: 07/03/2017 Discharge date: 07/04/2017    Jamie K Tysonis a 57 y.o.femalewith history of hypertension and renal artery dysplasia who came into the hospital today complaining of worsening chest pain. She actually saw cardiology 5 days ago for evaluation of exertional dyspnea and an abnormal EKG and was scheduled to undergo a Myoview next week as an outpatient. She states that she was walking in a store when she had tightness in her chest radiating to both shoulder blades, more intense and prolonged than usual she comes to the hospital for evaluation. Troponin is negative, EKG shows diffuse ST-T wave changes. Recent CT angios showed no evidence of PE. She has already been seen by cardiology who was planning on performing stress test in a.m.  Patient was admitted and monitored on continuous telemetry.  The patient was seen by cardiology as well.  Plans were made for an inpatient stress test which the patient had this morning.  There were no signs of ischemia and nl EF.  No blockages noted.  A low risk study was reported.  The patient had serial troponin testing that remain negative x3.  The patient is going to be discharged home with outpatient follow-up.  She says that she is going to follow-up with the [facility] and she is going to make arrangements for an outpatient pulmonary consultation.  She was strongly advised to avoid tobacco and given written materials and resources for smoking cessation.  Discharge Diagnoses:  Principal Problem:   Chest  pain Active Problems:   Essential hypertension, benign   Tobacco abuse   GERD (gastroesophageal reflux disease)      07/12/2017 1st [place] [place] [facility] office visit/ Jamie Mcgee   Chief Complaint  Patient presents with  . pulmonary consult    SOB with any level of activity.-cough productive  onset x 4 m insidious progressive to point where  doe x down one aisle at [employer]  = MMRC3 = can't walk 100 yards even at a slow pace at a flat grade s stopping due to sob   Sleep 30 degrees ok  Continues to cough day > noct , mucoid rec Stop benazapril and start benicar 20mg  daily instead - if blood pressure too high take twice daily  The key is to stop smoking completely before smoking completely stops you!  Dulera 200 Take 2 puffs first thing in am and then another 2 puffs about 12 hours later.  Work on inhaler technique:  relax and gently blow all the way out then take a nice smooth deep breath back in, triggering the inhaler at same time you start breathing in.  Hold for up to 5 seconds if you can. Blow out thru nose. Rinse and gargle with water when done Please schedule a follow up office visit in 6 weeks, call sooner if needed with full pfts - bring all medications with you    12/28/2017  f/u ov/Jamie Mcgee re:  Copd gold 0, still smoking  Chief Complaint  Patient presents with  . Follow-up    Pt has SOB with  exertion, and productive cough-yellow mucus. Pt is some better since last OV.  Dyspnea:  MMRC3 = can't walk 100 yards even at a slow pace at a flat grade s stopping due to sob  / stops due to feet hurt Cough: better off acei/ daytime mild never noct or am congestion  Sleeping: on side/ flat New report   Hypersomnolence  / sleeps only 6 hours  SABA use: none 02: none    No obvious day to day or daytime variability or assoc excess/ purulent sputum or mucus plugs or hemoptysis or cp or chest tightness, subjective wheeze or overt sinus or hb symptoms.     Also denies any obvious fluctuation of  symptoms with weather or environmental changes or other aggravating or alleviating factors except as outlined above   No unusual exposure hx or h/o childhood pna/ asthma or knowledge of premature birth.  Current Allergies, Complete Past Medical History, Past Surgical History, Family History, and Social History were reviewed in [employer] record.  ROS  The following are not active complaints unless bolded Hoarseness, sore throat, dysphagia, dental problems, itching, sneezing,  nasal congestion or discharge of excess mucus or purulent secretions, ear ache,   fever, chills, sweats, unintended wt loss or wt gain, classically pleuritic or exertional cp,  orthopnea pnd or arm/hand swelling  or leg swelling, presyncope, palpitations, abdominal pain, anorexia, nausea, vomiting, diarrhea  or change in bowel habits or change in bladder habits, change in stools or change in urine, dysuria, hematuria,  rash, arthralgias, visual complaints, headache, numbness, weakness or ataxia or problems with walking or coordination,  change in mood or  memory.        Current Meds  Medication Sig  . carvedilol (COREG) 6.25 MG tablet TAKE 1 TABLET BY MOUTH TWICE DAILY WITH A MEAL  . citalopram (CELEXA) 20 MG tablet TAKE 1 TABLET BY MOUTH ONCE DAILY  . fexofenadine (ALLEGRA) 180 MG tablet Take 180 mg by mouth daily.  Marland Kitchen gabapentin (NEURONTIN) 800 MG tablet TAKE 1 TABLET BY MOUTH THREE TIMES DAILY  . hydrochlorothiazide (HYDRODIURIL) 25 MG tablet TAKE 1 TABLET BY MOUTH ONCE DAILY  . methocarbamol (ROBAXIN) 500 MG tablet Take 1 tablet (500 mg total) by mouth every 8 (eight) hours as needed for muscle spasms.  . mometasone-formoterol (DULERA) 200-5 MCG/ACT AERO Inhale 2 puffs into the lungs 2 (two) times daily.  . nicotine polacrilex (NICORETTE) 4 MG gum Take 4 mg by mouth as needed for smoking cessation.  . nitroGLYCERIN (NITROSTAT) 0.4 MG SL tablet Place 1 tablet (0.4 mg total) under the tongue every 5  (five) minutes as needed.  Marland Kitchen olmesartan (BENICAR) 20 MG tablet Take 1 tablet (20 mg total) by mouth daily.  Marland Kitchen omeprazole (PRILOSEC OTC) 20 MG tablet Take 20 mg by mouth daily.                      Objective:   Physical Exam    amb obese wf nad  12/28/2017        206   07/12/17 202 lb 6.4 oz (91.8 kg)  07/04/17 203 lb 11.3 oz (92.4 kg)  06/29/17 203 lb 12.8 oz (92.4 kg)     Vital signs reviewed - Note on arrival 02 sats  98% on RA  And bp 124/74    HEENT: nl dentition, turbinates bilaterally, and oropharynx. Nl external ear canals without cough reflex Modified Mallampati Score =  3   NECK :  without  JVD/Nodes/TM/ nl carotid upstrokes bilaterally   LUNGS: no acc muscle use,  Nl contour chest which is clear to A and P bilaterally without cough on insp or exp maneuvers   CV:  RRR  no s3 or murmur or increase in P2, and no edema   ABD:  Obese nontender with nl inspiratory excursion in the supine position. No bruits or organomegaly appreciated, bowel sounds nl  MS:  Nl gait/ ext warm without deformities, calf tenderness, cyanosis or clubbing No obvious joint restrictions   SKIN: warm and dry without lesions    NEURO:  alert, approp, nl sensorium with  no motor or cerebellar deficits apparent.          I personally reviewed images and agree with radiology impression as follows:   Chest CTa 07/02/17 1. No evidence for acute pulmonary embolus. 2. Small cluster of tree-in-bud nodules within the left lower lobe compatible with mild inflammatory or infectious bronchiolitis. 3. Hepatic steatosis.    Assessment:

## 2017-12-28 NOTE — Progress Notes (Signed)
Patient completed full PFT today. 

## 2017-12-28 NOTE — Assessment & Plan Note (Signed)
4-5 min discussion re active cigarette smoking in addition to office E&M  Ask about tobacco use:   ongoing Advise quitting    I reviewed the Fletcher curve with the patient that basically indicates  if you quit smoking when your best day FEV1 is still well preserved (as is clearly  the case here)  it is highly unlikely you will progress to severe disease and informed the patient there was  no medication on the market that has proven to alter the curve/ its downward trajectory  or the likelihood of progression of their disease(unlike other chronic medical conditions such as atheroclerosis where we do think we can change the natural hx with risk reducing meds)    Therefore stopping smoking and maintaining abstinence are  the most important aspects of care, not choice of inhalers or for that matter, doctors.   Treatment other than smoking cessation  is entirely directed by severity of symptoms and focused also on reducing exacerbations, not attempting to change the natural history of the disease.   Assess willingness:  Not committed at this point Assist in quit attempt:  Per PCP when ready Arrange follow up:   Follow up per Primary Care planned       

## 2017-12-28 NOTE — Assessment & Plan Note (Addendum)
Almost certainly has sleep apnea by hx / exam/ risk factor (obesity) so rec split night study if insurance will cover and refer to sleep medicine if positive

## 2017-12-28 NOTE — Assessment & Plan Note (Addendum)
D/c acei 07/12/2017 due to sob/cough> improved 12/28/2017    Adequate control on present rx, reviewed in detail with pt > no change in rx needed  = benicar 20 mg daily with Follow up per Primary Care planned    Although even in retrospect it may not be clear the ACEi contributed to the pt's symptoms,  Pt improved off them and adding them back at this point or in the future would risk confusion in interpretation of non-specific respiratory symptoms to which this patient is prone  ie  Better not to muddy the waters here.

## 2018-01-04 ENCOUNTER — Encounter: Payer: Self-pay | Admitting: Family Medicine

## 2018-01-04 ENCOUNTER — Ambulatory Visit (INDEPENDENT_AMBULATORY_CARE_PROVIDER_SITE_OTHER): Payer: BLUE CROSS/BLUE SHIELD | Admitting: Family Medicine

## 2018-01-04 ENCOUNTER — Ambulatory Visit: Payer: BLUE CROSS/BLUE SHIELD | Attending: Internal Medicine | Admitting: Pulmonary Disease

## 2018-01-04 VITALS — BP 113/87 | HR 70 | Temp 98.3°F | Ht 64.0 in | Wt 208.0 lb

## 2018-01-04 DIAGNOSIS — H9193 Unspecified hearing loss, bilateral: Secondary | ICD-10-CM | POA: Diagnosis not present

## 2018-01-04 DIAGNOSIS — M8589 Other specified disorders of bone density and structure, multiple sites: Secondary | ICD-10-CM | POA: Diagnosis not present

## 2018-01-04 DIAGNOSIS — E559 Vitamin D deficiency, unspecified: Secondary | ICD-10-CM | POA: Diagnosis not present

## 2018-01-04 DIAGNOSIS — Z23 Encounter for immunization: Secondary | ICD-10-CM | POA: Diagnosis not present

## 2018-01-04 DIAGNOSIS — G4710 Hypersomnia, unspecified: Secondary | ICD-10-CM | POA: Insufficient documentation

## 2018-01-04 DIAGNOSIS — Z124 Encounter for screening for malignant neoplasm of cervix: Secondary | ICD-10-CM

## 2018-01-04 DIAGNOSIS — Z01419 Encounter for gynecological examination (general) (routine) without abnormal findings: Secondary | ICD-10-CM | POA: Diagnosis not present

## 2018-01-04 DIAGNOSIS — R252 Cramp and spasm: Secondary | ICD-10-CM | POA: Diagnosis not present

## 2018-01-04 DIAGNOSIS — R7303 Prediabetes: Secondary | ICD-10-CM

## 2018-01-04 DIAGNOSIS — G4733 Obstructive sleep apnea (adult) (pediatric): Secondary | ICD-10-CM | POA: Insufficient documentation

## 2018-01-04 DIAGNOSIS — D72829 Elevated white blood cell count, unspecified: Secondary | ICD-10-CM

## 2018-01-04 DIAGNOSIS — I10 Essential (primary) hypertension: Secondary | ICD-10-CM

## 2018-01-04 LAB — BAYER DCA HB A1C WAIVED: HB A1C (BAYER DCA - WAIVED): 5.7 % (ref <7.0)

## 2018-01-04 NOTE — Progress Notes (Signed)
Jamie Mcgee is a 57 y.o. female presents to office today for annual physical exam examination.    Concerns today include:  1. Obesity Patient notes concern about weight gain.  She notes that she is typically physically active at work and sweats quite a bit.  She is worried that her Celexa and gabapentin may be contributing.  She notes that Celexa was started after she went through menopause.  She does not think that she needs a medication would like to taper off.  2. Leg cramps Patient reports bilateral LE cramping.  She took a potassium pill yesterday but this did not help.  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  She had custom orthotics made but has not started wearing them yet.  She notes associated low back stiffness that is most prominent earlier in the day after she wakes up.  No falls.   Marital status: married, Substance use: smokes Diet: fair, Exercise: active at work Last colonoscopy: 2011 Last mammogram: 08/2017  Last pap smear: ? Refills needed today: none Immunizations needed: TDap, PNA  Past Medical History:  Diagnosis Date  . Carpal tunnel syndrome on right   . Cervical stenosis of spine    C5-6  . 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 Depression   . 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 GERD (gastroesophageal reflux disease)   . Hepatitis    type A in 1977  . HNP (herniated nucleus pulposus), cervical   . HTN (hypertension)   . Neuropathy    Social History   Socioeconomic History  . Marital status: Single    Spouse name: Not on file  . Number of children: Not on file  . Years of education: Not on file  . Highest education level: Not on file  Occupational History  . Not on file  Social Needs  . Financial resource strain: Not on file  . Food insecurity:    Worry: Not on file    Inability: Not on file  . Transportation needs:    Medical: Not on file    Non-medical: Not on file  Tobacco Use  .  Smoking status: Current Every Day Smoker    Packs/day: 1.00    Years: 40.00    Pack years: 40.00    Types: Cigarettes  . Smokeless tobacco: Never Used  . Tobacco comment: pt quit smoking 2016 for 7 months but restarted last year  Substance and Sexual Activity  . Alcohol use: No    Alcohol/week: 0.0 oz  . Drug use: Yes    Frequency: 0.5 times per week    Types: Marijuana    Comment: weekly  . Sexual activity: Yes    Birth control/protection: None, Post-menopausal  Lifestyle  . Physical activity:    Days per week: Not on file    Minutes per session: Not on file  . Stress: Not on file  Relationships  . Social connections:    Talks on phone: Not on file    Gets together: Not on file    Attends religious service: Not on file    Active member of club or organization: Not on file    Attends meetings of clubs or organizations: Not on file    Relationship status: Not on file  . Intimate partner violence:    Fear of current or ex partner: Not on file    Emotionally abused: Not on file    Physically abused: Not  on file    Forced sexual activity: Not on file  Other Topics Concern  . Not on file  Social History Narrative   The patient does not have any sex drive and has not had one for almost a year.   Her   husband thinks she does not love him anymore.  He does not seem to understand   her   depression.   Past Surgical History:  Procedure Laterality Date  . ANTERIOR CERVICAL DECOMP/DISCECTOMY FUSION N/A 10/05/2017   Procedure: C5-6 ANTERIOR CERVICAL DECOMPRESSION/DISCECTOMY FUSION,  ALLOGRAFT, PLATE  (NECK FIRST AND CARPAL TUNNEL RELEASE SECOND);  Surgeon: Marybelle Killings, MD;  Location: [facility];  Service: Orthopedics;  Laterality: N/A;  . APPENDECTOMY  1993  . BREAST BIOPSY    . CARPAL TUNNEL RELEASE Right 10/05/2017   Procedure: RIGHT CARPAL TUNNEL RELEASE;  Surgeon: Marybelle Killings, MD;  Location: [facility];  Service: Orthopedics;  Laterality: Right;  . CATARACT EXTRACTION  08-2010   left    . CATARACT EXTRACTION W/PHACO  06/01/2011   Procedure: CATARACT EXTRACTION PHACO AND INTRAOCULAR LENS PLACEMENT (IOC);  Surgeon: Tonny Branch;  Location: [facility];  Service: Ophthalmology;  Laterality: Right;  CDE:9.62  . CHOLECYSTECTOMY    . RENAL ARTERY ANGIOPLASTY  2006   right  . TUBAL LIGATION  1984   [facility]   Family History  Problem Relation Age of Onset  . Coronary artery disease Father        States congenital abnormality  . Diabetes Father   . Hyperlipidemia Father   . CAD Sister        Stents   . Cancer Sister        breast  . Cancer Mother        breast  . Cancer Maternal Aunt        breast  . Cancer Maternal Grandmother        breast  . Anesthesia problems Neg Hx   . Hypotension Neg Hx   . Malignant hyperthermia Neg Hx   . Pseudochol deficiency Neg Hx     Current Outpatient Medications:  .  carvedilol (COREG) 6.25 MG tablet, TAKE 1 TABLET BY MOUTH TWICE DAILY WITH A MEAL, Disp: 180 tablet, Rfl: 0 .  citalopram (CELEXA) 20 MG tablet, TAKE 1 TABLET BY MOUTH ONCE DAILY, Disp: 90 tablet, Rfl: 0 .  DUEXIS 800-26.6 MG TABS, Take 1 tablet by mouth 3 (three) times daily., Disp: , Rfl: 2 .  fexofenadine (ALLEGRA) 180 MG tablet, Take 180 mg by mouth daily., Disp: , Rfl:  .  gabapentin (NEURONTIN) 800 MG tablet, TAKE 1 TABLET BY MOUTH THREE TIMES DAILY, Disp: 270 tablet, Rfl: 0 .  hydrochlorothiazide (HYDRODIURIL) 25 MG tablet, TAKE 1 TABLET BY MOUTH ONCE DAILY, Disp: 90 tablet, Rfl: 1 .  methocarbamol (ROBAXIN) 500 MG tablet, Take 1 tablet (500 mg total) by mouth every 8 (eight) hours as needed for muscle spasms., Disp: 60 tablet, Rfl: 0 .  mometasone-formoterol (DULERA) 200-5 MCG/ACT AERO, Inhale 2 puffs into the lungs 2 (two) times daily., Disp: 1 Inhaler, Rfl: 3 .  nicotine polacrilex (NICORETTE) 4 MG gum, Take 4 mg by mouth as needed for smoking cessation., Disp: , Rfl:  .  nitroGLYCERIN (NITROSTAT) 0.4 MG SL tablet, Place 1 tablet (0.4 mg total) under the tongue every 5  (five) minutes as needed., Disp: 25 tablet, Rfl: 3 .  olmesartan (BENICAR) 20 MG tablet, Take 1 tablet (20 mg total) by mouth daily., Disp: 30  tablet, Rfl: 11 .  omeprazole (PRILOSEC OTC) 20 MG tablet, Take 20 mg by mouth daily., Disp: , Rfl:  .  trimethoprim-polymyxin b (POLYTRIM) ophthalmic solution, , Disp: , Rfl:   Allergies  Allergen Reactions  . Lipitor [Atorvastatin] Other (See Comments)    Muscle aches and nausea  . Naproxen Nausea And Vomiting    Stomach pain     ROS: Review of Systems Constitutional: negative Eyes: negative Ears, nose, mouth, throat, and face: positive for hearing loss Respiratory: positive for COPD well controlled. Cardiovascular: negative Gastrointestinal: negative Genitourinary:negative Integument/breast: negative Hematologic/lymphatic: positive for easy bruising Musculoskeletal:positive for back pain and foot pain Neurological: positive for tingling in feet Behavioral/Psych: negative Endocrine: negative Allergic/Immunologic: negative    Physical exam BP 113/87   Pulse 70   Temp 98.3 F (36.8 C) (Oral)   Ht 5\' 4"  (1.626 m)   Wt 208 lb (94.3 kg)   LMP 05/25/2011   BMI 35.70 kg/m  General appearance: alert, cooperative, appears stated age and no distress Head: Normocephalic, without obvious abnormality, atraumatic Eyes: negative findings: lids and lashes normal, conjunctivae and sclerae normal, corneas clear and pupils equal, round, reactive to light and accomodation Ears: normal TM's and external ear canals both ears Nose: Nares normal. Septum midline. Mucosa normal. No drainage or sinus tenderness. Throat: lips, mucosa, and tongue normal; teeth and gums normal Neck: no adenopathy, no carotid bruit, no JVD, supple, symmetrical, trachea midline, thyroid not enlarged, symmetric, no tenderness/mass/nodules and small well healed post surgical scar noted Back: symmetric, no curvature. ROM normal. No CVA tenderness. Lungs: clear to auscultation  bilaterally Breasts: not examined Heart: regular rate and rhythm, S1, S2 normal, no murmur, click, rub or gallop Abdomen: soft, non-tender; bowel sounds normal; no masses,  no organomegaly Pelvic: cervix normal in appearance, external genitalia normal, no adnexal masses or tenderness, no cervical motion tenderness, rectovaginal septum normal, uterus normal size, shape, and consistency and vagina normal without discharge Extremities: extremities normal, atraumatic, no cyanosis or edema Pulses: 2+ and symmetric Skin: several excoriated areas along dorsums of bilateral hands and forearms Lymph nodes: Cervical, supraclavicular, and axillary nodes normal. Neurologic: Alert and oriented X 3, normal strength and tone. Normal symmetric reflexes. Normal coordination and gait    Assessment/ Plan: Edison Pace here for annual physical exam.   1. Well woman exam with routine gynecological exam - Pap IG and HPV (high risk) DNA detection  2. Screening for cervical cancer - Pap IG and HPV (high risk) DNA detection  3. Essential hypertension, benign Controlled.  4. Osteopenia of multiple sites - VITAMIN D 25 Hydroxy (Vit-D Deficiency, Fractures)  5. Vitamin D deficiency - VITAMIN D 25 Hydroxy (Vit-D Deficiency, Fractures)  6. Morbid obesity ([place]) - Bayer DCA Hb A1c Waived - Lipid Panel  7. Pre-diabetes - Bayer DCA Hb A1c Waived - Lipid Panel  8. Leukocytosis, unspecified type - CBC  9. Leg cramps Will check for metabolic reasons.  If no improvement w/ orthotics, consider seeing Dr Lorin Mercy for evaluation of back. - Magnesium  10. Bilateral hearing loss, unspecified hearing loss type - Ambulatory referral to Audiology   Orders Placed This Encounter  Procedures  . Bayer DCA Hb A1c Waived  . VITAMIN D 25 Hydroxy (Vit-D Deficiency, Fractures)  . CBC  . Lipid Panel  . Magnesium  . TSH  . Ambulatory referral to Audiology    Counseled on healthy lifestyle choices, including diet  (rich in fruits, vegetables and lean meats and low in salt and  simple carbohydrates) and exercise (at least 30 minutes of moderate physical activity daily).  Patient to follow up in 1 year for annual exam or sooner if needed.  Anahla Bevis M. Lajuana Ripple, DO

## 2018-01-04 NOTE — Addendum Note (Signed)
Addended byCarrolyn Leigh on: 01/04/2018 03:45 PM   Modules accepted: Orders

## 2018-01-04 NOTE — Patient Instructions (Addendum)
You had labs performed today.  You will be contacted with the results of the labs once they are available, usually in the next 3 business days for routine lab work.  If you had a pap smear or biopsy performed, expect to be contacted in about 7-10 days.   For the celexa taper:  Week 1-2: take 1/2 tablet every day  Week 3-4: Take 1/2 tablet every OTHER day.  Then stop.  If orthotics do not improve the foot and back pain, schedule an appt w/ Dr Lorin Mercy.  If it does, contact me and we can try and taper you off of the Gabapentin.  If you are interested in medical weight loss, I recommend calling Dr Dennard Nip. Jamie Mcgee, Female Adopting a healthy lifestyle and getting preventive care can go a long way to promote health and wellness. Talk with your health care provider about what schedule of regular examinations is right for you. This is a good chance for you to check in with your provider about disease prevention and staying healthy. In between checkups, there are plenty of things you can do on your own. Experts have done a lot of research about which lifestyle changes and preventive measures are most likely to keep you healthy. Ask your health care provider for more information. Weight and diet Eat a healthy diet  Be sure to include plenty of vegetables, fruits, low-fat dairy products, and lean protein.  Do not eat a lot of foods high in solid fats, added sugars, or salt.  Get regular exercise. This is one of the most important things you can do for your health. ? Most adults should exercise for at least 150 minutes each week. The exercise should increase your heart rate and make you sweat (moderate-intensity exercise). ? Most adults should also do strengthening exercises at least twice a week. This is in addition to the moderate-intensity exercise.  Maintain a healthy weight  Body mass index (BMI) is a measurement that can be used to identify possible weight  problems. It estimates body fat based on height and weight. Your health care provider can help determine your BMI and help you achieve or maintain a healthy weight.  For females 84 years of age and older: ? A BMI below 18.5 is considered underweight. ? A BMI of 18.5 to 24.9 is normal. ? A BMI of 25 to 29.9 is considered overweight. ? A BMI of 30 and above is considered obese.  Watch levels of cholesterol and blood lipids  You should start having your blood tested for lipids and cholesterol at 58 years of age, then have this test every 5 years.  You may need to have your cholesterol levels checked more often if: ? Your lipid or cholesterol levels are high. ? You are older than 58 years of age. ? You are at high risk for heart disease.  Cancer screening Lung Cancer  Lung cancer screening is recommended for adults 100-56 years old who are at high risk for lung cancer because of a history of smoking.  A yearly low-dose CT scan of the lungs is recommended for people who: ? Currently smoke. ? Have quit within the past 15 years. ? Have at least a 30-pack-year history of smoking. A pack year is smoking an average of one pack of cigarettes a day for 1 year.  Yearly screening should continue until it has been 15 years since you quit.  Yearly screening should stop if you develop a health problem  that would prevent you from having lung cancer treatment.  Breast Cancer  Practice breast self-awareness. This means understanding how your breasts normally appear and feel.  It also means doing regular breast self-exams. Let your health care provider know about any changes, no matter how small.  If you are in your 20s or 30s, you should have a clinical breast exam (CBE) by a health care provider every 1-3 years as part of a regular health exam.  If you are 8 or older, have a CBE every year. Also consider having a breast X-ray (mammogram) every year.  If you have a family history of breast  cancer, talk to your health care provider about genetic screening.  If you are at high risk for breast cancer, talk to your health care provider about having an MRI and a mammogram every year.  Breast cancer gene (BRCA) assessment is recommended for women who have family members with BRCA-related cancers. BRCA-related cancers include: ? Breast. ? Ovarian. ? Tubal. ? Peritoneal cancers.  Results of the assessment will determine the need for genetic counseling and BRCA1 and BRCA2 testing.  Cervical Cancer Your health care provider may recommend that you be screened regularly for cancer of the pelvic organs (ovaries, uterus, and vagina). This screening involves a pelvic examination, including checking for microscopic changes to the surface of your cervix (Pap test). You may be encouraged to have this screening done every 3 years, beginning at age 75.  For women ages 56-65, health care providers may recommend pelvic exams and Pap testing every 3 years, or they may recommend the Pap and pelvic exam, combined with testing for human papilloma virus (HPV), every 5 years. Some types of HPV increase your risk of cervical cancer. Testing for HPV may also be done on women of any age with unclear Pap test results.  Other health care providers may not recommend any screening for nonpregnant women who are considered low risk for pelvic cancer and who do not have symptoms. Ask your health care provider if a screening pelvic exam is right for you.  If you have had past treatment for cervical cancer or a condition that could lead to cancer, you need Pap tests and screening for cancer for at least 20 years after your treatment. If Pap tests have been discontinued, your risk factors (such as having a new sexual partner) need to be reassessed to determine if screening should resume. Some women have medical problems that increase the chance of getting cervical cancer. In these cases, your health care provider may  recommend more frequent screening and Pap tests.  Colorectal Cancer  This type of cancer can be detected and often prevented.  Routine colorectal cancer screening usually begins at 58 years of age and continues through 58 years of age.  Your health care provider may recommend screening at an earlier age if you have risk factors for colon cancer.  Your health care provider may also recommend using home test kits to check for hidden blood in the stool.  A small camera at the end of a tube can be used to examine your colon directly (sigmoidoscopy or colonoscopy). This is done to check for the earliest forms of colorectal cancer.  Routine screening usually begins at age 38.  Direct examination of the colon should be repeated every 5-10 years through 58 years of age. However, you may need to be screened more often if early forms of precancerous polyps or small growths are found.  Skin Cancer  Check your skin from head to toe regularly.  Tell your health care provider about any new moles or changes in moles, especially if there is a change in a mole's shape or color.  Also tell your health care provider if you have a mole that is larger than the size of a pencil eraser.  Always use sunscreen. Apply sunscreen liberally and repeatedly throughout the day.  Protect yourself by wearing long sleeves, pants, a wide-brimmed hat, and sunglasses whenever you are outside.  Heart disease, diabetes, and high blood pressure  High blood pressure causes heart disease and increases the risk of stroke. High blood pressure is more likely to develop in: ? People who have blood pressure in the high end of the normal range (130-139/85-89 mm Hg). ? People who are overweight or obese. ? People who are African American.  If you are 51-61 years of age, have your blood pressure checked every 3-5 years. If you are 83 years of age or older, have your blood pressure checked every year. You should have your blood  pressure measured twice-once when you are at a hospital or clinic, and once when you are not at a hospital or clinic. Record the average of the two measurements. To check your blood pressure when you are not at a hospital or clinic, you can use: ? An automated blood pressure machine at a pharmacy. ? A home blood pressure monitor.  If you are between 41 years and 6 years old, ask your health care provider if you should take aspirin to prevent strokes.  Have regular diabetes screenings. This involves taking a blood sample to check your fasting blood sugar level. ? If you are at a normal weight and have a low risk for diabetes, have this test once every three years after 58 years of age. ? If you are overweight and have a high risk for diabetes, consider being tested at a younger age or more often. Preventing infection Hepatitis B  If you have a higher risk for hepatitis B, you should be screened for this virus. You are considered at high risk for hepatitis B if: ? You were born in a country where hepatitis B is common. Ask your health care provider which countries are considered high risk. ? Your parents were born in a high-risk country, and you have not been immunized against hepatitis B (hepatitis B vaccine). ? You have HIV or AIDS. ? You use needles to inject street drugs. ? You live with someone who has hepatitis B. ? You have had sex with someone who has hepatitis B. ? You get hemodialysis treatment. ? You take certain medicines for conditions, including cancer, organ transplantation, and autoimmune conditions.  Hepatitis C  Blood testing is recommended for: ? Everyone born from [birth year] through 1966. ? Anyone with known risk factors for hepatitis C.  Sexually transmitted infections (STIs)  You should be screened for sexually transmitted infections (STIs) including gonorrhea and chlamydia if: ? You are sexually active and are younger than 58 years of age. ? You are older than 58 years  of age and your health care provider tells you that you are at risk for this type of infection. ? Your sexual activity has changed since you were last screened and you are at an increased risk for chlamydia or gonorrhea. Ask your health care provider if you are at risk.  If you do not have HIV, but are at risk, it may be recommended that you take a prescription  medicine daily to prevent HIV infection. This is called pre-exposure prophylaxis (PrEP). You are considered at risk if: ? You are sexually active and do not regularly use condoms or know the HIV status of your partner(s). ? You take drugs by injection. ? You are sexually active with a partner who has HIV.  Talk with your health care provider about whether you are at high risk of being infected with HIV. If you choose to begin PrEP, you should first be tested for HIV. You should then be tested every 3 months for as long as you are taking PrEP. Pregnancy  If you are premenopausal and you may become pregnant, ask your health care provider about preconception counseling.  If you may become pregnant, take 400 to 800 micrograms (mcg) of folic acid every day.  If you want to prevent pregnancy, talk to your health care provider about birth control (contraception). Osteoporosis and menopause  Osteoporosis is a disease in which the bones lose minerals and strength with aging. This can result in serious bone fractures. Your risk for osteoporosis can be identified using a bone density scan.  If you are 44 years of age or older, or if you are at risk for osteoporosis and fractures, ask your health care provider if you should be screened.  Ask your health care provider whether you should take a calcium or vitamin D supplement to lower your risk for osteoporosis.  Menopause may have certain physical symptoms and risks.  Hormone replacement therapy may reduce some of these symptoms and risks. Talk to your health care provider about whether hormone  replacement therapy is right for you. Follow these instructions at home:  Schedule regular health, dental, and eye exams.  Stay current with your immunizations.  Do not use any tobacco products including cigarettes, chewing tobacco, or electronic cigarettes.  If you are pregnant, do not drink alcohol.  If you are breastfeeding, limit how much and how often you drink alcohol.  Limit alcohol intake to no more than 1 drink per day for nonpregnant women. One drink equals 12 ounces of beer, 5 ounces of wine, or 1 ounces of hard liquor.  Do not use street drugs.  Do not share needles.  Ask your health care provider for help if you need support or information about quitting drugs.  Tell your health care provider if you often feel depressed.  Tell your health care provider if you have ever been abused or do not feel safe at home. This information is not intended to replace advice given to you by your health care provider. Make sure you discuss any questions you have with your health care provider. Document Released: 12/05/2010 Document Revised: 10/28/2015 Document Reviewed: 02/23/2015 Elsevier Interactive Patient Education  [employer].

## 2018-01-05 LAB — CBC
HEMATOCRIT: 42.6 % (ref 34.0–46.6)
Hemoglobin: 14 g/dL (ref 11.1–15.9)
MCH: 29.5 pg (ref 26.6–33.0)
MCHC: 32.9 g/dL (ref 31.5–35.7)
MCV: 90 fL (ref 79–97)
Platelets: 296 10*3/uL (ref 150–450)
RBC: 4.75 x10E6/uL (ref 3.77–5.28)
RDW: 13.2 % (ref 12.3–15.4)
WBC: 12.8 10*3/uL — ABNORMAL HIGH (ref 3.4–10.8)

## 2018-01-05 LAB — LIPID PANEL
CHOL/HDL RATIO: 3.2 ratio (ref 0.0–4.4)
CHOLESTEROL TOTAL: 175 mg/dL (ref 100–199)
HDL: 54 mg/dL (ref >39)
LDL CALC: 100 mg/dL — AB (ref 0–99)
Triglycerides: 104 mg/dL (ref 0–149)
VLDL CHOLESTEROL CAL: 21 mg/dL (ref 5–40)

## 2018-01-05 LAB — TSH: TSH: 1.3 u[IU]/mL (ref 0.450–4.500)

## 2018-01-05 LAB — VITAMIN D 25 HYDROXY (VIT D DEFICIENCY, FRACTURES): Vit D, 25-Hydroxy: 20.9 ng/mL — ABNORMAL LOW (ref 30.0–100.0)

## 2018-01-05 LAB — MAGNESIUM: Magnesium: 2 mg/dL (ref 1.6–2.3)

## 2018-01-07 ENCOUNTER — Other Ambulatory Visit: Payer: Self-pay | Admitting: Family Medicine

## 2018-01-07 DIAGNOSIS — E876 Hypokalemia: Secondary | ICD-10-CM

## 2018-01-07 DIAGNOSIS — D72829 Elevated white blood cell count, unspecified: Secondary | ICD-10-CM

## 2018-01-09 LAB — PAP IG AND HPV HIGH-RISK
HPV, HIGH-RISK: NEGATIVE
PAP SMEAR COMMENT: 0

## 2018-01-11 ENCOUNTER — Ambulatory Visit (INDEPENDENT_AMBULATORY_CARE_PROVIDER_SITE_OTHER): Payer: BLUE CROSS/BLUE SHIELD | Admitting: Family Medicine

## 2018-01-11 ENCOUNTER — Encounter: Payer: Self-pay | Admitting: Family Medicine

## 2018-01-11 ENCOUNTER — Encounter: Payer: BLUE CROSS/BLUE SHIELD | Admitting: Family Medicine

## 2018-01-11 ENCOUNTER — Telehealth: Payer: Self-pay | Admitting: Family Medicine

## 2018-01-11 ENCOUNTER — Telehealth: Payer: Self-pay | Admitting: Internal Medicine

## 2018-01-11 VITALS — BP 111/71 | HR 75 | Temp 97.1°F | Ht 64.0 in | Wt 208.0 lb

## 2018-01-11 DIAGNOSIS — D72829 Elevated white blood cell count, unspecified: Secondary | ICD-10-CM

## 2018-01-11 DIAGNOSIS — E876 Hypokalemia: Secondary | ICD-10-CM

## 2018-01-11 DIAGNOSIS — H0014 Chalazion left upper eyelid: Secondary | ICD-10-CM

## 2018-01-11 DIAGNOSIS — E782 Mixed hyperlipidemia: Secondary | ICD-10-CM

## 2018-01-11 DIAGNOSIS — R945 Abnormal results of liver function studies: Secondary | ICD-10-CM

## 2018-01-11 DIAGNOSIS — R7989 Other specified abnormal findings of blood chemistry: Secondary | ICD-10-CM

## 2018-01-11 MED ORDER — ROSUVASTATIN CALCIUM 10 MG PO TABS: 10.0000 mg | ORAL_TABLET | Freq: Every day | ORAL | 0 refills | Status: DC

## 2018-01-11 NOTE — Patient Instructions (Signed)
I have prescribed you Crestor 10 mg.  I would like you to try and take this daily but if you find that you are starting to get any side effects you could go down to every other day or every 3 days dosing.  If you still find that you are unable to tolerate the medication, we discussed alternative lesser potent cholesterol meds like Pravachol or Mevacor.  We also discussed a brand-name only cholesterol medication called Livalo.  I do have samples of Livalo should you decide that you would like to try that medication.  Call me and let me know how things are going in the next 2 to 4 weeks.  Plan to see me back in about 3 months or sooner if you need me.  You had labs performed today.  You will be contacted with the results of the labs once they are available, usually in the next 3 business days for routine lab work.   We are repeating your CBC in am having the pathologist look at your blood work under a microscope.  We discussed that you may need a referral to a hematologist to discuss this chronic elevation in your white blood cells further.  I will contact you with these results once available.  Chalazion A chalazion is a swelling or lump on the eyelid. It can affect the upper or lower eyelid. What are the causes? This condition may be caused by:  Long-lasting (chronic) inflammation of the eyelid glands.  A blocked oil gland in the eyelid.  What are the signs or symptoms? Symptoms of this condition include:  A swelling on the eyelid. The swelling may spread to areas around the eye.  A hard lump on the eyelid. This lump may make it hard to see out of the eye.  How is this diagnosed? This condition is diagnosed with an examination of the eye. How is this treated? This condition is treated by applying a warm compress to the eyelid. If the condition does not improve after two days, it may be treated with:  Surgery.  Medicine that is injected into the chalazion by a health care  provider.  Medicine that is applied to the eye.  Follow these instructions at home:  Do not touch the chalazion.  Do not try to remove the pus, such as by squeezing the chalazion or sticking it with a pin or needle.  Do not rub your eyes.  Wash your hands often. Dry your hands with a clean towel.  Keep your face, scalp, and eyebrows clean.  Avoid wearing eye makeup.  Apply a warm, moist compress to the eyelid 4-6 times a day for 10-15 minutes at a time. This will help to open any blocked glands and help to reduce redness and swelling.  Apply over-the-counter and prescription medicines only as told by your health care provider.  If the chalazion does not break open (rupture) on its own in a month, return to your health care provider.  Keep all follow-up appointments as told by your health care provider. This is important. Contact a health care provider if:  Your eyelid has not improved in 4 weeks.  Your eyelid is getting worse.  You have a fever.  The chalazion does not rupture on its own with home treatment in a month. Get help right away if:  You have pain in your eye.  Your vision changes.  The chalazion becomes painful or red  The chalazion gets bigger. This information is not intended  to replace advice given to you by your health care provider. Make sure you discuss any questions you have with your health care provider. Document Released: 05/19/2000 Document Revised: 10/28/2015 Document Reviewed: 09/14/2014 Elsevier Interactive Patient Education  [employer].

## 2018-01-11 NOTE — Telephone Encounter (Signed)
Called patient unable to reach left message to give us a call back.

## 2018-01-11 NOTE — Progress Notes (Signed)
Subjective: CC: Hyperlipidemia, leukocytosis PCP: [name], DO OEU:MPNTIRW Jamie Mcgee is a 57 y.o. female presenting to clinic today for:  1.  Hyperlipidemia Patient noted to have hyperlipidemia with an ASCVD risk of 9.9% the next 10 years.  She notes that previously she was placed on Lipitor but was unable to tolerate it secondary to thigh muscle aches and nausea.  She notes that no other cholesterol lowering medications had been tried since that time.  2.  Leukocytosis Patient with persistent leukocytosis since February 2019.  She had a corticosteroid injection to bilateral feet by orthopedics about 3 weeks prior to obtaining last study.  No unplanned weight loss, fatigue.  In fact, she notes that since she started tapering down from the Celexa her energy has improved.  3.  Eyelid swelling Patient reports left upper eyelid swelling.  She notes that it is nontender.  No fevers.  She has been using an eyedrop in the left eye with little improvement in symptoms.  She has not been performing any compresses.   ROS: Per HPI  Allergies  Allergen Reactions  . Lipitor [Atorvastatin] Other (See Comments)    Muscle aches and nausea  . Naproxen Nausea And Vomiting    Stomach pain   Past Medical History:  Diagnosis Date  . Carpal tunnel syndrome on right   . Cervical stenosis of spine    C5-6  . 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 Depression   . 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 GERD (gastroesophageal reflux disease)   . Hepatitis    type A in 1977  . HNP (herniated nucleus pulposus), cervical   . HTN (hypertension)   . Neuropathy     Current Outpatient Medications:  .  carvedilol (COREG) 6.25 MG tablet, TAKE 1 TABLET BY MOUTH TWICE DAILY WITH A MEAL, Disp: 180 tablet, Rfl: 0 .  citalopram (CELEXA) 20 MG tablet, TAKE 1 TABLET BY MOUTH ONCE DAILY, Disp: 90 tablet, Rfl: 0 .  DUEXIS 800-26.6 MG TABS, Take 1 tablet by mouth 3  (three) times daily., Disp: , Rfl: 2 .  fexofenadine (ALLEGRA) 180 MG tablet, Take 180 mg by mouth daily., Disp: , Rfl:  .  gabapentin (NEURONTIN) 800 MG tablet, TAKE 1 TABLET BY MOUTH THREE TIMES DAILY, Disp: 270 tablet, Rfl: 0 .  hydrochlorothiazide (HYDRODIURIL) 25 MG tablet, TAKE 1 TABLET BY MOUTH ONCE DAILY, Disp: 90 tablet, Rfl: 1 .  methocarbamol (ROBAXIN) 500 MG tablet, Take 1 tablet (500 mg total) by mouth every 8 (eight) hours as needed for muscle spasms., Disp: 60 tablet, Rfl: 0 .  nicotine polacrilex (NICORETTE) 4 MG gum, Take 4 mg by mouth as needed for smoking cessation., Disp: , Rfl:  .  nitroGLYCERIN (NITROSTAT) 0.4 MG SL tablet, Place 1 tablet (0.4 mg total) under the tongue every 5 (five) minutes as needed., Disp: 25 tablet, Rfl: 3 .  olmesartan (BENICAR) 20 MG tablet, Take 1 tablet (20 mg total) by mouth daily., Disp: 30 tablet, Rfl: 11 .  omeprazole (PRILOSEC OTC) 20 MG tablet, Take 20 mg by mouth daily., Disp: , Rfl:  .  trimethoprim-polymyxin b (POLYTRIM) ophthalmic solution, , Disp: , Rfl:  Social History   Socioeconomic History  . Marital status: Single    Spouse name: Not on file  . Number of children: Not on file  . Years of education: Not on file  . Highest education level: Not on file  Occupational History  .  Not on file  Social Needs  . Financial resource strain: Not on file  . Food insecurity:    Worry: Not on file    Inability: Not on file  . Transportation needs:    Medical: Not on file    Non-medical: Not on file  Tobacco Use  . Smoking status: Current Every Day Smoker    Packs/day: 1.00    Years: 40.00    Pack years: 40.00    Types: Cigarettes  . Smokeless tobacco: Never Used  . Tobacco comment: pt quit smoking 2016 for 7 months but restarted last year  Substance and Sexual Activity  . Alcohol use: No    Alcohol/week: 0.0 standard drinks  . Drug use: Yes    Frequency: 0.5 times per week    Types: Marijuana    Comment: weekly  . Sexual  activity: Yes    Birth control/protection: None, Post-menopausal  Lifestyle  . Physical activity:    Days per week: Not on file    Minutes per session: Not on file  . Stress: Not on file  Relationships  . Social connections:    Talks on phone: Not on file    Gets together: Not on file    Attends religious service: Not on file    Active member of club or organization: Not on file    Attends meetings of clubs or organizations: Not on file    Relationship status: Not on file  . Intimate partner violence:    Fear of current or ex partner: Not on file    Emotionally abused: Not on file    Physically abused: Not on file    Forced sexual activity: Not on file  Other Topics Concern  . Not on file  Social History Narrative   The patient does not have any sex drive and has not had one for almost a year.   Her   husband thinks she does not love him anymore.  He does not seem to understand   her   depression.   Family History  Problem Relation Age of Onset  . Coronary artery disease Father        States congenital abnormality  . Diabetes Father   . Hyperlipidemia Father   . CAD Sister        Stents   . Cancer Sister        breast  . Cancer Mother        breast  . Cancer Maternal Aunt        breast  . Cancer Maternal Grandmother        breast  . Anesthesia problems Neg Hx   . Hypotension Neg Hx   . Malignant hyperthermia Neg Hx   . Pseudochol deficiency Neg Hx     Objective: Office vital signs reviewed. BP 111/71   Pulse 75   Temp (!) 97.1 F (36.2 C) (Oral)   Ht 5\' 4"  (1.626 m)   Wt 208 lb (94.3 kg)   LMP 05/25/2011   BMI 35.70 kg/m   Physical Examination:  General: Awake, alert, well nourished, well appearing female. No acute distress HEENT: Normal    Eyes: PERRLA, extraocular membranes intact, sclera white.  Left upper eyelid with small soft tissue swelling on the medial aspect of the eyelid.  No significant discharge.  No conjunctivitis noted. Cardio: regular  rate and rhythm, S1S2 heard, no murmurs appreciated Pulm:  normal work of breathing on room air  Assessment/ Plan: 57 y.o. female  1. Mixed hyperlipidemia ASCVD risk of 9.9%.  Patient is amenable to pursuing an alternative statin.  I have placed her on Crestor 10 mg daily.  We discussed pulse dosing and she will go to every other day versus every 3-day dosing if she develops myalgia.  If she is totally unable to tolerate Crestor, we could consider placing her on a low potency statin like Pravachol or Mevacor.  We also discussed Livalo as an alternative.  She will contact me in the next 2 to 4 weeks to inform me how she is doing on the Crestor.  We will adjust plan pending this.  Plan for repeat direct LDL and CMP in 3 months.  2. Leukocytosis, unspecified type Unsure if this is a reactive leukocytosis.  Given its prevalence over the last several months, we did discuss having this evaluated further.  Will obtain repeat CBC with pathologist smear review.  If leukocytosis is still present and pathology review is unrevealing, we will plan to refer to hematology for further evaluation.  Patient agreeable to plan. - Pathologist smear review - CBC with Differential  3. Chalazion left upper eyelid Recommended warm compresses several times daily.  Handout provided.  Reasons for return discussed.  4. Hypokalemia Jamie 3.3 on May 2019 check.  Will recheck today. - Basic Metabolic Panel  5. Elevated LFTs Mild elevation noted in May of her AST.  Check Hep C ab. - Hepatitis C antibody   Meds ordered this encounter  Medications  . rosuvastatin (CRESTOR) 10 MG tablet    Sig: Take 1 tablet (10 mg total) by mouth daily.    Dispense:  90 tablet    Refill:  Dutch John, DO [place] [phone]

## 2018-01-12 LAB — CBC WITH DIFFERENTIAL/PLATELET
BASOS: 1 %
Basophils Absolute: 0.1 10*3/uL (ref 0.0–0.2)
EOS (ABSOLUTE): 0.1 10*3/uL (ref 0.0–0.4)
Eos: 1 %
Hematocrit: 40.3 % (ref 34.0–46.6)
Hemoglobin: 13.9 g/dL (ref 11.1–15.9)
IMMATURE GRANS (ABS): 0 10*3/uL (ref 0.0–0.1)
Immature Granulocytes: 0 %
LYMPHS ABS: 3.6 10*3/uL — AB (ref 0.7–3.1)
Lymphs: 33 %
MCH: 30.9 pg (ref 26.6–33.0)
MCHC: 34.5 g/dL (ref 31.5–35.7)
MCV: 90 fL (ref 79–97)
Monocytes Absolute: 0.7 10*3/uL (ref 0.1–0.9)
Monocytes: 7 %
NEUTROS ABS: 6.4 10*3/uL (ref 1.4–7.0)
Neutrophils: 58 %
PLATELETS: 295 10*3/uL (ref 150–450)
RBC: 4.5 x10E6/uL (ref 3.77–5.28)
RDW: 14.5 % (ref 12.3–15.4)
WBC: 10.9 10*3/uL — ABNORMAL HIGH (ref 3.4–10.8)

## 2018-01-12 LAB — BASIC METABOLIC PANEL
BUN/Creatinine Ratio: 12 (ref 9–23)
BUN: 13 mg/dL (ref 6–24)
CO2: 27 mmol/L (ref 20–29)
CREATININE: 1.07 mg/dL — AB (ref 0.57–1.00)
Calcium: 9.6 mg/dL (ref 8.7–10.2)
Chloride: 97 mmol/L (ref 96–106)
GFR calc non Af Amer: 58 mL/min/{1.73_m2} — ABNORMAL LOW (ref >59)
GFR, EST AFRICAN AMERICAN: 67 mL/min/{1.73_m2} (ref >59)
Glucose: 83 mg/dL (ref 65–99)
Potassium: 3.6 mmol/L (ref 3.5–5.2)
Sodium: 140 mmol/L (ref 134–144)

## 2018-01-12 LAB — HEPATITIS C ANTIBODY: Hep C Virus Ab: <0.1 s/co ratio (ref 0.0–0.9)

## 2018-01-14 NOTE — Telephone Encounter (Signed)
Attempted to call pt. I did not receive an answer. I have left a message for pt to return our call.  

## 2018-01-15 NOTE — Telephone Encounter (Signed)
Spoke with pt. Per her chart, the results are still being processed. Advised her that we will contact her as soon as they come available. She verbalized understanding.

## 2018-01-16 LAB — PATHOLOGIST SMEAR REVIEW
BASOS: 0 %
Basophils Absolute: 0 10*3/uL (ref 0.0–0.2)
EOS (ABSOLUTE): 0.1 10*3/uL (ref 0.0–0.4)
Eos: 1 %
HEMATOCRIT: 40.4 % (ref 34.0–46.6)
HEMOGLOBIN: 14.3 g/dL (ref 11.1–15.9)
IMMATURE GRANS (ABS): 0.1 10*3/uL (ref 0.0–0.1)
Immature Granulocytes: 1 %
LYMPHS ABS: 3.4 10*3/uL — AB (ref 0.7–3.1)
Lymphs: 32 %
MCH: 31.6 pg (ref 26.6–33.0)
MCHC: 35.4 g/dL (ref 31.5–35.7)
MCV: 89 fL (ref 79–97)
MONOS ABS: 0.6 10*3/uL (ref 0.1–0.9)
Monocytes: 6 %
NEUTROS ABS: 6.4 10*3/uL (ref 1.4–7.0)
Neutrophils: 60 %
PATH REV RBC: NORMAL
PATH REV WBC: NORMAL
Path Rev PLTs: NORMAL
Platelets: 285 10*3/uL (ref 150–450)
RBC: 4.53 x10E6/uL (ref 3.77–5.28)
RDW: 14.6 % (ref 12.3–15.4)
WBC: 10.7 10*3/uL (ref 3.4–10.8)

## 2018-01-17 NOTE — Procedures (Signed)
   Patient Name: Jamie Mcgee, Jamie Mcgee Date: 01/04/2018   Gender: Female  D.O.B: Apr 07, 1961  Age (years): 84  Referring Provider: Tanda Rockers  Height (inches): 62  Interpreting Physician: Chesley Mires MD, ABSM  Weight (lbs): 206  RPSGT: Peak, Robert  BMI: 35  MRN: [id number]  Neck Size: 17.00  CLINICAL INFORMATION  Sleep Study Type: NPSG Indication for sleep study: Excessive Daytime Sleepiness Epworth Sleepiness Score: 18 SLEEP STUDY TECHNIQUE  As per the AASM Manual for the Scoring of Sleep and Associated Events v2.3 (April 2016) with a hypopnea requiring 4% desaturations. The channels recorded and monitored were frontal, central and occipital EEG, electrooculogram (EOG), submentalis EMG (chin), nasal and oral airflow, thoracic and abdominal wall motion, anterior tibialis EMG, snore microphone, electrocardiogram, and pulse oximetry. MEDICATIONS  Medications self-administered by patient taken the night of the study : N/A SLEEP ARCHITECTURE  The study was initiated at 9:45:59 PM and ended at 4:28:16 AM. Sleep onset time was 2.5 minutes and the sleep efficiency was 91.5%%. The total sleep time was 368 minutes. Stage REM latency was 142.0 minutes. The patient spent 10.7%% of the night in stage N1 sleep, 66.4%% in stage N2 sleep, 0.0%% in stage N3 and 22.8% in REM. Alpha intrusion was absent. Supine sleep was 0.00%. RESPIRATORY PARAMETERS  The overall apnea/hypopnea index (AHI) was 6.7 per hour. There were 16 total apneas, including 4 obstructive, 12 central and 0 mixed apneas. There were 25 hypopneas and 63 RERAs. The AHI during Stage REM sleep was 18.6 per hour. AHI while supine was N/A per hour. The mean oxygen saturation was 94.6%. The minimum SpO2 during sleep was 88.0%. loud snoring was noted during this study. CARDIAC DATA  The 2 lead EKG demonstrated sinus rhythm. The mean heart rate was 62.5 beats per minute. Other EKG findings include: None.  LEG MOVEMENT DATA  The  total PLMS were 0 with a resulting PLMS index of 0.0. Associated arousal with leg movement index was 0.0 . IMPRESSIONS  - Mild obstructive sleep apnea with an AHI of 6.7 and SpO2 low of 88%. She did not require supplemental oxygen during the study. DIAGNOSIS  - Obstructive Sleep Apnea (327.23 [G47.33 ICD-10]) RECOMMENDATIONS  - Additional therapies include weight loss, CPAP, oral appliance, or surgical intervention. [Electronically signed] 01/17/2018 08:26 AM Chesley Mires MD, [place], American Board of Sleep Medicine  NPI: [id number]

## 2018-01-18 ENCOUNTER — Encounter: Payer: Self-pay | Admitting: Family Medicine

## 2018-01-18 ENCOUNTER — Other Ambulatory Visit: Payer: Self-pay

## 2018-01-18 DIAGNOSIS — G4733 Obstructive sleep apnea (adult) (pediatric): Secondary | ICD-10-CM | POA: Insufficient documentation

## 2018-01-18 DIAGNOSIS — G6289 Other specified polyneuropathies: Secondary | ICD-10-CM

## 2018-01-18 MED ORDER — GABAPENTIN 800 MG PO TABS: 800.0000 mg | ORAL_TABLET | Freq: Three times a day (TID) | ORAL | 0 refills | Status: DC

## 2018-02-08 ENCOUNTER — Telehealth: Payer: Self-pay | Admitting: Family Medicine

## 2018-02-08 NOTE — Telephone Encounter (Signed)
Patient aware, appointment scheduled for 02/15/18

## 2018-02-08 NOTE — Telephone Encounter (Signed)
Please have her come in to be seen so we can place the referral.

## 2018-02-11 DIAGNOSIS — H0015 Chalazion left lower eyelid: Secondary | ICD-10-CM | POA: Diagnosis not present

## 2018-02-15 ENCOUNTER — Ambulatory Visit (INDEPENDENT_AMBULATORY_CARE_PROVIDER_SITE_OTHER): Payer: BLUE CROSS/BLUE SHIELD | Admitting: Family Medicine

## 2018-02-15 ENCOUNTER — Encounter: Payer: Self-pay | Admitting: Family Medicine

## 2018-02-15 VITALS — BP 111/70 | HR 107 | Temp 98.6°F | Ht 64.0 in | Wt 207.0 lb

## 2018-02-15 DIAGNOSIS — M5441 Lumbago with sciatica, right side: Secondary | ICD-10-CM | POA: Diagnosis not present

## 2018-02-15 DIAGNOSIS — R252 Cramp and spasm: Secondary | ICD-10-CM | POA: Diagnosis not present

## 2018-02-15 DIAGNOSIS — R200 Anesthesia of skin: Secondary | ICD-10-CM

## 2018-02-15 DIAGNOSIS — R202 Paresthesia of skin: Secondary | ICD-10-CM

## 2018-02-15 DIAGNOSIS — G8929 Other chronic pain: Secondary | ICD-10-CM

## 2018-02-15 DIAGNOSIS — M5442 Lumbago with sciatica, left side: Secondary | ICD-10-CM | POA: Diagnosis not present

## 2018-02-15 NOTE — Progress Notes (Signed)
Subjective: CC: HTN, foot numbness PCP: [name], DO GDJ:MEQASTM K Carbon is a 57 y.o. female presenting to clinic today for:  1. Low back pain w/ foot numbness Patient is presenting today for acute on chronic low back pain.  She has had low back pain for quite some time now.  Pain seems to be exacerbated by standing position.  It is relieved by flexing forward or going into fetal position.  She points to the bilateral low back and states that it radiates down into the buttocks.  She does note pain that radiates down to lower extremities bilaterally.  She notes intermittent bilateral lower extremity spasm and cramping.  She also has associated numbness and tingling of the feet.  She is wondering if she should see a neurologist for this.  She is actually under the care of orthopedics, who she reports had x-rays of her lumbar spine in March.  She has a history of cervical spine stenosis with recent surgical intervention earlier this year.  Patient denies any urinary retention, fecal incontinence or saddle anesthesia.  No falls.  ROS: Per HPI  Allergies  Allergen Reactions  . Lipitor [Atorvastatin] Other (See Comments)    Muscle aches and nausea  . Naproxen Nausea And Vomiting    Stomach pain   Past Medical History:  Diagnosis Date  . Carpal tunnel syndrome on right   . Cervical stenosis of spine    C5-6  . 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 Depression   . 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 GERD (gastroesophageal reflux disease)   . Hepatitis    type A in 1977  . HNP (herniated nucleus pulposus), cervical   . HTN (hypertension)   . Neuropathy     Current Outpatient Medications:  .  carvedilol (COREG) 6.25 MG tablet, TAKE 1 TABLET BY MOUTH TWICE DAILY WITH A MEAL, Disp: 180 tablet, Rfl: 0 .  doxycycline (VIBRAMYCIN) 100 MG capsule, Take 100 mg by mouth daily. , Disp: , Rfl:  .  DUEXIS 800-26.6 MG TABS, Take 1 tablet by  mouth 3 (three) times daily., Disp: , Rfl: 2 .  fexofenadine (ALLEGRA) 180 MG tablet, Take 180 mg by mouth daily., Disp: , Rfl:  .  hydrochlorothiazide (HYDRODIURIL) 25 MG tablet, TAKE 1 TABLET BY MOUTH ONCE DAILY, Disp: 90 tablet, Rfl: 1 .  methocarbamol (ROBAXIN) 500 MG tablet, Take 1 tablet (500 mg total) by mouth every 8 (eight) hours as needed for muscle spasms., Disp: 60 tablet, Rfl: 0 .  nicotine polacrilex (NICORETTE) 4 MG gum, Take 4 mg by mouth as needed for smoking cessation., Disp: , Rfl:  .  nitroGLYCERIN (NITROSTAT) 0.4 MG SL tablet, Place 1 tablet (0.4 mg total) under the tongue every 5 (five) minutes as needed., Disp: 25 tablet, Rfl: 3 .  olmesartan (BENICAR) 20 MG tablet, Take 1 tablet (20 mg total) by mouth daily., Disp: 30 tablet, Rfl: 11 .  omeprazole (PRILOSEC OTC) 20 MG tablet, Take 20 mg by mouth daily., Disp: , Rfl:  .  rosuvastatin (CRESTOR) 10 MG tablet, Take 1 tablet (10 mg total) by mouth daily., Disp: 90 tablet, Rfl: 0 Social History   Socioeconomic History  . Marital status: Single    Spouse name: Not on file  . Number of children: Not on file  . Years of education: Not on file  . Highest education level: Not on file  Occupational History  . Not on  file  Social Needs  . Financial resource strain: Not on file  . Food insecurity:    Worry: Not on file    Inability: Not on file  . Transportation needs:    Medical: Not on file    Non-medical: Not on file  Tobacco Use  . Smoking status: Current Every Day Smoker    Packs/day: 1.00    Years: 40.00    Pack years: 40.00    Types: Cigarettes  . Smokeless tobacco: Never Used  . Tobacco comment: pt quit smoking 2016 for 7 months but restarted last year  Substance and Sexual Activity  . Alcohol use: No    Alcohol/week: 0.0 standard drinks  . Drug use: Yes    Frequency: 0.5 times per week    Types: Marijuana    Comment: weekly  . Sexual activity: Yes    Birth control/protection: None, Post-menopausal    Lifestyle  . Physical activity:    Days per week: Not on file    Minutes per session: Not on file  . Stress: Not on file  Relationships  . Social connections:    Talks on phone: Not on file    Gets together: Not on file    Attends religious service: Not on file    Active member of club or organization: Not on file    Attends meetings of clubs or organizations: Not on file    Relationship status: Not on file  . Intimate partner violence:    Fear of current or ex partner: Not on file    Emotionally abused: Not on file    Physically abused: Not on file    Forced sexual activity: Not on file  Other Topics Concern  . Not on file  Social History Narrative   The patient does not have any sex drive and has not had one for almost a year.   Her   husband thinks she does not love him anymore.  He does not seem to understand   her   depression.   Family History  Problem Relation Age of Onset  . Coronary artery disease Father        States congenital abnormality  . Diabetes Father   . Hyperlipidemia Father   . CAD Sister        Stents   . Cancer Sister        breast  . Cancer Mother        breast  . Cancer Maternal Aunt        breast  . Cancer Maternal Grandmother        breast  . Anesthesia problems Neg Hx   . Hypotension Neg Hx   . Malignant hyperthermia Neg Hx   . Pseudochol deficiency Neg Hx     Objective: Office vital signs reviewed. BP 111/70   Pulse (!) 107   Temp 98.6 F (37 C) (Oral)   Ht 5\' 4"  (1.626 m)   Wt 207 lb (93.9 kg)   LMP 05/25/2011   BMI 35.53 kg/m   Physical Examination:  General: Awake, alert, obese, No acute distress Extremities: warm, well perfused, No edema, cyanosis or clubbing; +2 pulses bilaterally MSK: antalgic gait and station  Lumbar spine: Patient has pain with extension.  Active range of motion in flexion and rotation is preserved.  No midline tenderness to palpation.  She does have paraspinal muscle tenderness to palpation with  left greater than right.  No palpable bony abnormalities.  She has pain with  straight leg raise on the right. Skin: dry; intact; no rashes or lesions Neuro: 5/5 LE Strength and light touch sensation grossly intact, patellar DTRs 1/4 bilaterally.  She has preserved vibratory sense in bilateral feet.  Monofilament exam is decreased bilaterally.  Pressure and pain sensations intact.  She has decreased light touch sensation to the L5 and S1 dermatomes.  Assessment/ Plan: 57 y.o. female   1. Chronic bilateral low back pain with bilateral sciatica Patient with history of known degenerative C-spine disease.  Per patient, she has already had x-rays of her lumbar spine with her orthopedist and told she had "arthritic changes".  I will place an MRI of the lumbar spine without contrast to further evaluate.  Her physical exam was remarkable for positive straight leg raise on the right.  She has decreased light touch sensation to the L5/S1 dermatome bilaterally.  She has decreased monofilament exam on bilateral feet.  She has pain with extension.  I am concerned for possible herniated disc with nerve impingement.  We discussed reasons for emergent evaluation emergency department.  We will plan referral to neurosurgery versus neurology pending MRI of lumbar spine.  2. Numbness and tingling of foot As above  3. Cramp of both lower extremities As above   Orders Placed This Encounter  Procedures  . MR Lumbar Spine Wo Contrast    Standing Status:   Future    Standing Expiration Date:   04/18/2019    Order Specific Question:   ** REASON FOR EXAM (FREE TEXT)    Answer:   Patient has worsening low back pain with bilateral lower extremity cramping and numbness and tingling of bilateral feet.    Order Specific Question:   What is the patient's sedation requirement?    Answer:   No Sedation    Order Specific Question:   Does the patient have a pacemaker or implanted devices?    Answer:   No    Order Specific  Question:   Preferred imaging location?    Answer:   [facility] (table limit-350lbs)    Order Specific Question:   Radiology Contrast Protocol - do NOT remove file path    Answer:   [email or web address]      [name], DO [place] [phone]

## 2018-02-26 ENCOUNTER — Ambulatory Visit (HOSPITAL_COMMUNITY)
Admission: RE | Admit: 2018-02-26 | Discharge: 2018-02-26 | Disposition: A | Discharge status: Home / Self Care | Payer: BLUE CROSS/BLUE SHIELD | Source: Ambulatory Visit | Attending: Family Medicine | Admitting: Family Medicine

## 2018-02-26 DIAGNOSIS — R252 Cramp and spasm: Secondary | ICD-10-CM

## 2018-02-26 DIAGNOSIS — R200 Anesthesia of skin: Secondary | ICD-10-CM

## 2018-02-26 DIAGNOSIS — M4316 Spondylolisthesis, lumbar region: Secondary | ICD-10-CM | POA: Insufficient documentation

## 2018-02-26 DIAGNOSIS — R202 Paresthesia of skin: Secondary | ICD-10-CM

## 2018-02-26 DIAGNOSIS — M545 Low back pain: Secondary | ICD-10-CM | POA: Diagnosis not present

## 2018-02-26 DIAGNOSIS — M47816 Spondylosis without myelopathy or radiculopathy, lumbar region: Secondary | ICD-10-CM | POA: Diagnosis not present

## 2018-02-26 DIAGNOSIS — M5441 Lumbago with sciatica, right side: Secondary | ICD-10-CM

## 2018-02-26 DIAGNOSIS — M5136 Other intervertebral disc degeneration, lumbar region: Secondary | ICD-10-CM | POA: Diagnosis not present

## 2018-02-26 DIAGNOSIS — M5442 Lumbago with sciatica, left side: Secondary | ICD-10-CM | POA: Diagnosis not present

## 2018-02-26 DIAGNOSIS — G8929 Other chronic pain: Secondary | ICD-10-CM

## 2018-02-27 ENCOUNTER — Other Ambulatory Visit: Payer: Self-pay | Admitting: Family Medicine

## 2018-02-27 DIAGNOSIS — M51369 Other intervertebral disc degeneration, lumbar region without mention of lumbar back pain or lower extremity pain: Secondary | ICD-10-CM

## 2018-02-27 DIAGNOSIS — M5136 Other intervertebral disc degeneration, lumbar region: Secondary | ICD-10-CM

## 2018-02-27 NOTE — Progress Notes (Signed)
Mr Lumbar Spine Wo Contrast  Result Date: 02/27/2018 CLINICAL DATA:  Chronic low back pain bilaterally EXAM: MRI LUMBAR SPINE WITHOUT CONTRAST TECHNIQUE: Multiplanar, multisequence MR imaging of the lumbar spine was performed. No intravenous contrast was administered. COMPARISON:  CT abdomen 11/13/2014 FINDINGS: Segmentation: The lowest lumbar type non-rib-bearing vertebra is labeled as L5. Alignment:  3 mm degenerative anterolisthesis at L4-5. Vertebrae: Mild degenerative facet edema on the left at L4-5. Disc desiccation at all levels between L2 and L5. Conus medullaris and cauda equina: Conus extends to the L1 level. Conus and cauda equina appear normal. Paraspinal and other soft tissues: Unremarkable Disc levels: T12-L1: No impingement.  Small central disc protrusion. L1-2: Unremarkable L2-3: No impingement. Small synovial cyst posterior to the right L2-3 facet joint, but not in a position to cause impingement. L3-4: No impingement.  Mild right eccentric disc bulge. L4-5: No impingement. Disc bulge and left greater than right facet arthropathy. L5-S1: No impingement.  Mild facet arthropathy. IMPRESSION: 1. Mild lumbar spondylosis and degenerative disc disease, without observed impingement. 2. Mild degenerative facet edema on the left at L4-5. 3. 3 mm degenerative anterolisthesis at L4-5. Electronically Signed   By: Van Clines M.D.   On: 02/27/2018 09:17

## 2018-02-28 ENCOUNTER — Other Ambulatory Visit: Payer: Self-pay

## 2018-02-28 ENCOUNTER — Emergency Department (HOSPITAL_COMMUNITY): Payer: BLUE CROSS/BLUE SHIELD

## 2018-02-28 ENCOUNTER — Emergency Department (HOSPITAL_COMMUNITY)
Admission: EM | Admit: 2018-02-28 | Discharge: 2018-02-28 | Disposition: A | Discharge status: Home / Self Care | Payer: BLUE CROSS/BLUE SHIELD | Attending: Emergency Medicine | Admitting: Emergency Medicine

## 2018-02-28 ENCOUNTER — Encounter (HOSPITAL_COMMUNITY): Payer: Self-pay

## 2018-02-28 DIAGNOSIS — Y998 Other external cause status: Secondary | ICD-10-CM | POA: Diagnosis not present

## 2018-02-28 DIAGNOSIS — F17210 Nicotine dependence, cigarettes, uncomplicated: Secondary | ICD-10-CM | POA: Diagnosis not present

## 2018-02-28 DIAGNOSIS — Z79899 Other long term (current) drug therapy: Secondary | ICD-10-CM | POA: Insufficient documentation

## 2018-02-28 DIAGNOSIS — Y93E1 Activity, personal bathing and showering: Secondary | ICD-10-CM | POA: Diagnosis not present

## 2018-02-28 DIAGNOSIS — I10 Essential (primary) hypertension: Secondary | ICD-10-CM | POA: Diagnosis not present

## 2018-02-28 DIAGNOSIS — J449 Chronic obstructive pulmonary disease, unspecified: Secondary | ICD-10-CM | POA: Insufficient documentation

## 2018-02-28 DIAGNOSIS — W182XXA Fall in (into) shower or empty bathtub, initial encounter: Secondary | ICD-10-CM | POA: Insufficient documentation

## 2018-02-28 DIAGNOSIS — S199XXA Unspecified injury of neck, initial encounter: Secondary | ICD-10-CM | POA: Diagnosis not present

## 2018-02-28 DIAGNOSIS — Y92002 Bathroom of unspecified non-institutional (private) residence single-family (private) house as the place of occurrence of the external cause: Secondary | ICD-10-CM | POA: Insufficient documentation

## 2018-02-28 DIAGNOSIS — M542 Cervicalgia: Secondary | ICD-10-CM

## 2018-02-28 MED ORDER — DIAZEPAM 5 MG PO TABS: 2.5000 mg | ORAL_TABLET | Freq: Three times a day (TID) | ORAL | 0 refills | Status: DC | PRN

## 2018-02-28 MED ORDER — DIAZEPAM 2 MG PO TABS
2.0000 mg | ORAL_TABLET | Freq: Once | ORAL | Status: AC
  Administered 2018-02-28: 2 mg via ORAL
  Filled 2018-02-28: qty 1

## 2018-02-28 NOTE — ED Triage Notes (Signed)
Pt reports she became lightheaded while in shower this morning and fell onto back. Pt reports neck pain that radiates into shoulder blades. Denies LOC

## 2018-02-28 NOTE — ED Provider Notes (Signed)
Emergency Department Provider Note   I have reviewed the triage vital signs and the nursing notes.   HISTORY  Chief Complaint Fall and Neck Pain   HPI Jamie Mcgee is a 57 y.o. female with multiple medical problems documented below the presents to the emergency department today with neck pain after a fall.  Patient was taken a shower and she felt like she slipped and fell backwards did not hit her head or pass out.  Did not injure anything else but she has had a stiff and painful neck since the fall.  She has discomfort in the right trapezius area.  Worse with movement. No new neurologic changes. Tried taking her 'foot medication' (which she says is an antiinflammatory for the pain but did not relieve it. No fever or sore throat.  No other associated or modifying symptoms.    Past Medical History:  Diagnosis Date  . Carpal tunnel syndrome on right   . Cervical stenosis of spine    C5-6  . COPD (chronic obstructive pulmonary disease) ([place])    Gold III  . Depression   . 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 GERD (gastroesophageal reflux disease)   . Hepatitis    type A in 1977  . HNP (herniated nucleus pulposus), cervical   . HTN (hypertension)   . Neuropathy     Patient Active Problem List   Diagnosis Date Noted  . Chronic bilateral low back pain with bilateral sciatica 02/15/2018  . Numbness and tingling of foot 02/15/2018  . OSA (obstructive sleep apnea) 01/18/2018  . Hypersomnolence 12/28/2017  . History of carpal tunnel release 11/15/2017  . History of fusion of cervical spine 10/18/2017  . COPD  GOLD 0 / still smoking  07/12/2017  . Osteopenia 11/22/2016  . Pre-diabetes 12/25/2015  . Morbid obesity ([place]) 12/25/2015  . Cramp of both lower extremities 07/02/2015  . GERD (gastroesophageal reflux disease) 05/07/2015  . Vitamin D deficiency 05/07/2015  . Metabolic syndrome 16/03/9603  . Post-menopausal bleeding 05/07/2015  . Cigarette smoker  03/08/2015  . Depressed 12/19/2013  . Essential hypertension, benign 12/19/2013    Past Surgical History:  Procedure Laterality Date  . ANTERIOR CERVICAL DECOMP/DISCECTOMY FUSION N/A 10/05/2017   Procedure: C5-6 ANTERIOR CERVICAL DECOMPRESSION/DISCECTOMY FUSION,  ALLOGRAFT, PLATE  (NECK FIRST AND CARPAL TUNNEL RELEASE SECOND);  Surgeon: Marybelle Killings, MD;  Location: [facility];  Service: Orthopedics;  Laterality: N/A;  . APPENDECTOMY  1993  . BREAST BIOPSY    . CARPAL TUNNEL RELEASE Right 10/05/2017   Procedure: RIGHT CARPAL TUNNEL RELEASE;  Surgeon: Marybelle Killings, MD;  Location: [facility];  Service: Orthopedics;  Laterality: Right;  . CATARACT EXTRACTION  08-2010   left   . CATARACT EXTRACTION W/PHACO  06/01/2011   Procedure: CATARACT EXTRACTION PHACO AND INTRAOCULAR LENS PLACEMENT (IOC);  Surgeon: Tonny Branch;  Location: [facility];  Service: Ophthalmology;  Laterality: Right;  CDE:9.62  . CHOLECYSTECTOMY    . RENAL ARTERY ANGIOPLASTY  2006   right  . TUBAL LIGATION  1984   [facility]    Current Outpatient Rx  . Order #: [id number] Class: Normal  . Order #: [id number] Class: Print  . Order #: [id number] Class: Historical Med  . Order #: [id number] Class: Historical Med  . Order #: [id number] Class: Historical Med  . Order #: [id number] Class: Normal  . Order #: [id number] Class: Print  . Order #: [id number] Class: Historical Med  . Order #: [id number] Class: Normal  . Order #: [id number] Class: Normal  . Order #:  [id number] Class: Historical Med  . Order #: [id number] Class: Normal  . Order #: [id number] Class: Historical Med    Allergies Lipitor [atorvastatin] and Naproxen  Family History  Problem Relation Age of Onset  . Coronary artery disease Father        States congenital abnormality  . Diabetes Father   . Hyperlipidemia Father   . CAD Sister        Stents   . Cancer Sister        breast  . Cancer Mother        breast  . Cancer Maternal Aunt        breast  . Cancer Maternal Grandmother        breast    . Anesthesia problems Neg Hx   . Hypotension Neg Hx   . Malignant hyperthermia Neg Hx   . Pseudochol deficiency Neg Hx     Social History Social History   Tobacco Use  . Smoking status: Current Every Day Smoker    Packs/day: 1.00    Years: 40.00    Pack years: 40.00    Types: Cigarettes  . Smokeless tobacco: Never Used  . Tobacco comment: pt quit smoking 2016 for 7 months but restarted last year  Substance Use Topics  . Alcohol use: No    Alcohol/week: 0.0 standard drinks  . Drug use: Yes    Frequency: 0.5 times per week    Types: Marijuana    Comment: weekly    Review of Systems  All other systems negative except as documented in the HPI. All pertinent positives and negatives as reviewed in the HPI. ____________________________________________   PHYSICAL EXAM:  VITAL SIGNS: Today's Vitals   02/28/18 0854 02/28/18 0855 02/28/18 1145 02/28/18 1204  BP:   (!) 144/90   Pulse:   73   Resp:   14   SpO2:   99%   Weight:  93.9 kg    Height:  5\' 4"  (1.626 m)    PainSc: 8    5      Constitutional: Alert and oriented. Well appearing and in no acute distress. Eyes: Conjunctivae are normal. PERRL. EOMI. Head: Atraumatic. Nose: No congestion/rhinnorhea. Mouth/Throat: Mucous membranes are moist.  Oropharynx non-erythematous. Neck: No stridor.  No meningeal signs.   Cardiovascular: Normal rate, regular rhythm. Good peripheral circulation. Grossly normal heart sounds.   Respiratory: Normal respiratory effort.  No retractions. Lungs CTAB. Gastrointestinal: Soft and nontender. No distention.  Musculoskeletal: No lower extremity tenderness nor edema. No gross deformities of extremities. ttp on r trapezius and with ROM of her neck.  Neurologic:  Normal speech and language. No gross focal neurologic deficits are appreciated.  Skin:  Skin is warm, dry and intact. No rash noted.   ____________________________________________   RADIOLOGY  Ct Cervical Spine Wo  Contrast  Result Date: 02/28/2018 CLINICAL DATA:  Pain following fall EXAM: CT CERVICAL SPINE WITHOUT CONTRAST TECHNIQUE: Multidetector CT imaging of the cervical spine was performed without intravenous contrast. Multiplanar CT image reconstructions were also generated. COMPARISON:  Cervical MRI August 20, 2017 FINDINGS: Alignment: There is no evident spondylolisthesis. Skull base and vertebrae: Patient is status post anterior screw and plate fixation at C5 and C6. Support hardware intact. There is a disc spacer at C5-6. The skull base and craniocervical junction regions appear normal. There is no appreciable fracture. There are no blastic or lytic bone lesions. Soft tissues and spinal canal: The prevertebral soft tissues and predental space regions are normal. There is no paraspinous lesion.  No cord or canal hematoma evident. Disc levels: There is moderately severe disc space narrowing at C6-7. Other disc spaces appear unremarkable. There is facet hypertrophy at multiple levels bilaterally. There is exit foraminal narrowing due to bony hypertrophy at C2-3 on the left, at C3-4 on the right, at C5-6 bilaterally, and to a lesser degree at C6-7 bilaterally. There is impression on the exiting nerve root on the right at C3-4 due to bony hypertrophy. There is no frank disc extrusion or high-grade stenosis evident. Upper chest: Visualized upper lung zones are clear. Other: There is mild calcification in each carotid artery. IMPRESSION: 1. Status post anterior screw and plate fixation at C5 and C6 with support hardware intact. There is a disc spacer at C5-6. 2.  No fracture or spondylolisthesis. 3. Osteoarthritic change at multiple levels. There is impression on the exiting nerve root at C3-4 on the right due to bony hypertrophy. No frank disc extrusion or stenosis. 4.  Mild carotid artery calcification bilaterally. Electronically Signed   By: Lowella Grip III M.D.   On: 02/28/2018 10:30     ____________________________________________   INITIAL IMPRESSION / ASSESSMENT AND PLAN / ED COURSE  No obvious cervical/neck injury. suispect muscular cause. Will treat for same. Stable for discharge.   Pertinent labs & imaging results that were available during my care of the patient were reviewed by me and considered in my medical decision making (see chart for details).  ____________________________________________  FINAL CLINICAL IMPRESSION(S) / ED DIAGNOSES  Final diagnoses:  Neck pain     MEDICATIONS GIVEN DURING THIS VISIT:  Medications  diazepam (VALIUM) tablet 2 mg (2 mg Oral Given 02/28/18 0926)     NEW OUTPATIENT MEDICATIONS STARTED DURING THIS VISIT:  Discharge Medication List as of 02/28/2018 11:53 AM    START taking these medications   Details  diazepam (VALIUM) 5 MG tablet Take 0.5 tablets (2.5 mg total) by mouth every 8 (eight) hours as needed (spasms)., Starting Thu 02/28/2018, Print        Note:  This note was prepared with assistance of Dragon voice recognition software. Occasional wrong-word or sound-a-like substitutions may have occurred due to the inherent limitations of voice recognition software.   Merrily Pew, MD 02/28/18 [phone]

## 2018-03-04 ENCOUNTER — Telehealth: Payer: Self-pay | Admitting: Family Medicine

## 2018-03-05 ENCOUNTER — Other Ambulatory Visit: Payer: Self-pay | Admitting: Family Medicine

## 2018-03-05 ENCOUNTER — Other Ambulatory Visit: Payer: Self-pay | Admitting: Internal Medicine

## 2018-03-05 NOTE — Telephone Encounter (Signed)
Patient states she has an apt with the spine doctor tomorrow and she is going to see if they will fill out the disability paper work.

## 2018-03-06 DIAGNOSIS — M546 Pain in thoracic spine: Secondary | ICD-10-CM | POA: Diagnosis not present

## 2018-03-06 DIAGNOSIS — M545 Low back pain: Secondary | ICD-10-CM | POA: Diagnosis not present

## 2018-03-06 DIAGNOSIS — M542 Cervicalgia: Secondary | ICD-10-CM | POA: Diagnosis not present

## 2018-03-06 DIAGNOSIS — M549 Dorsalgia, unspecified: Secondary | ICD-10-CM | POA: Diagnosis not present

## 2018-03-13 DIAGNOSIS — M545 Low back pain: Secondary | ICD-10-CM | POA: Diagnosis not present

## 2018-03-13 DIAGNOSIS — M542 Cervicalgia: Secondary | ICD-10-CM | POA: Diagnosis not present

## 2018-03-13 DIAGNOSIS — M546 Pain in thoracic spine: Secondary | ICD-10-CM | POA: Diagnosis not present

## 2018-03-15 ENCOUNTER — Encounter: Payer: Self-pay | Admitting: Family Medicine

## 2018-03-15 ENCOUNTER — Ambulatory Visit (INDEPENDENT_AMBULATORY_CARE_PROVIDER_SITE_OTHER): Payer: BLUE CROSS/BLUE SHIELD | Admitting: Family Medicine

## 2018-03-15 VITALS — BP 135/80 | HR 107 | Temp 97.7°F | Ht 64.0 in | Wt 207.0 lb

## 2018-03-15 DIAGNOSIS — M5441 Lumbago with sciatica, right side: Secondary | ICD-10-CM | POA: Diagnosis not present

## 2018-03-15 DIAGNOSIS — Z23 Encounter for immunization: Secondary | ICD-10-CM | POA: Diagnosis not present

## 2018-03-15 DIAGNOSIS — F331 Major depressive disorder, recurrent, moderate: Secondary | ICD-10-CM

## 2018-03-15 DIAGNOSIS — G8929 Other chronic pain: Secondary | ICD-10-CM | POA: Diagnosis not present

## 2018-03-15 DIAGNOSIS — F17210 Nicotine dependence, cigarettes, uncomplicated: Secondary | ICD-10-CM

## 2018-03-15 DIAGNOSIS — M5442 Lumbago with sciatica, left side: Secondary | ICD-10-CM | POA: Diagnosis not present

## 2018-03-15 MED ORDER — VARENICLINE TARTRATE 1 MG PO TABS: 1.0000 mg | ORAL_TABLET | Freq: Two times a day (BID) | ORAL | 2 refills | Status: DC

## 2018-03-15 MED ORDER — CITALOPRAM HYDROBROMIDE 40 MG PO TABS: 40.0000 mg | ORAL_TABLET | Freq: Every day | ORAL | 0 refills | Status: DC

## 2018-03-15 MED ORDER — VARENICLINE TARTRATE 0.5 MG X 11 & 1 MG X 42 PO MISC: ORAL | 0 refills | Status: DC

## 2018-03-15 NOTE — Progress Notes (Signed)
Subjective: CC: back pain/ neck pain PCP: [name], DO QMG:NOIBBCW Jamie Mcgee is a 57 y.o. female presenting to clinic today for:  1. Back pain/ neck pain Patient reports that she recently had a slip in her bathroom and hurt her neck.  She was able to see the spine specialist, whom she was referred to at last visit for her low back, and they address this at a recent visit.  She had a CT of her neck and there are plans for MRIs of her neck soon.  She notes that she was placed on a prednisone Dosepak and just started this yesterday.  This has helped quite a bit with her low back pain but she notes that the neck pain continues to be quite severe.  She  wondering denies any sensation changes of the upper extremities.  She has follow-up with orthopedist in 2 weeks.  No saddle anesthesia, fecal incontinence or urinary retention.  Patient plans to restart the Duexis after completion of the prednisone Dosepak.  She also has methocarbamol on hand if needed for muscle spasm.  2.  Depression/anxiety Patient reports that she was able to successfully wean completely from the Celexa but states that recently, symptoms have been returning and therefore she restarted the medicine.  She actually took 40 mg last night and felt that she slept very well and the symptoms of anxiety and depression were very well controlled.  Of note she also is on omeprazole 20 mg daily for acid reflux.  She notes that she has had some problems with acid on the Duexis specifically but has not tried coming off of the omeprazole prior to initiation of that medicine.  No recent GI bleed.  No SI, HI.  3. Tobacco use disorder Patient would like to discontinue smoking.  She denies any hemoptysis, wheeze.  She is interested in Chantix.  No history of seizure disorder.  ROS: Per HPI  Allergies  Allergen Reactions  . Lipitor [Atorvastatin] Other (See Comments)    Muscle aches and nausea  . Naproxen Nausea And Vomiting    Stomach  pain   Past Medical History:  Diagnosis Date  . Carpal tunnel syndrome on right   . Cervical stenosis of spine    C5-6  . COPD (chronic obstructive pulmonary disease) ([place])    Gold III  . Depression   . Fibromuscular dysplasia of renal artery ([place]) 2006  . GERD (gastroesophageal reflux disease)   . Hepatitis    type A in 1977  . HNP (herniated nucleus pulposus), cervical   . HTN (hypertension)   . Neuropathy     Current Outpatient Medications:  .  carvedilol (COREG) 6.25 MG tablet, TAKE 1 TABLET BY MOUTH TWICE DAILY WITH A MEAL, Disp: 180 tablet, Rfl: 1 .  citalopram (CELEXA) 40 MG tablet, Take 1 tablet (40 mg total) by mouth daily., Disp: 90 tablet, Rfl: 0 .  DUEXIS 800-26.6 MG TABS, Take 1 tablet by mouth 3 (three) times daily., Disp: , Rfl: 2 .  fexofenadine (ALLEGRA) 180 MG tablet, Take 180 mg by mouth daily., Disp: , Rfl:  .  gabapentin (NEURONTIN) 800 MG tablet, Take 800 mg by mouth daily., Disp: , Rfl:  .  hydrochlorothiazide (HYDRODIURIL) 25 MG tablet, TAKE 1 TABLET BY MOUTH ONCE DAILY, Disp: 90 tablet, Rfl: 1 .  methocarbamol (ROBAXIN) 500 MG tablet, Take 1 tablet (500 mg total) by mouth every 8 (eight) hours as needed for muscle spasms., Disp: 60 tablet, Rfl: 0 .  nicotine polacrilex (NICORETTE) 4 MG gum, Take 4 mg by mouth as needed for smoking cessation., Disp: , Rfl:  .  nitroGLYCERIN (NITROSTAT) 0.4 MG SL tablet, Place 1 tablet (0.4 mg total) under the tongue every 5 (five) minutes as needed., Disp: 25 tablet, Rfl: 3 .  olmesartan (BENICAR) 20 MG tablet, Take 1 tablet (20 mg total) by mouth daily., Disp: 30 tablet, Rfl: 11 .  predniSONE (STERAPRED UNI-PAK 21 TAB) 5 MG (21) TBPK tablet, , Disp: , Rfl:  .  rosuvastatin (CRESTOR) 10 MG tablet, Take 1 tablet (10 mg total) by mouth daily., Disp: 90 tablet, Rfl: 0 .  telmisartan (MICARDIS) 80 MG tablet, TAKE 1 TABLET BY MOUTH ONCE DAILY, Disp: 30 tablet, Rfl: 0 .  methocarbamol (ROBAXIN) 750 MG tablet, Take 750 mg by mouth  every 6 (six) hours., Disp: , Rfl: 0 Social History   Socioeconomic History  . Marital status: Single    Spouse name: Not on file  . Number of children: Not on file  . Years of education: Not on file  . Highest education level: Not on file  Occupational History  . Not on file  Social Needs  . Financial resource strain: Not on file  . Food insecurity:    Worry: Not on file    Inability: Not on file  . Transportation needs:    Medical: Not on file    Non-medical: Not on file  Tobacco Use  . Smoking status: Current Every Day Smoker    Packs/day: 1.00    Years: 40.00    Pack years: 40.00    Types: Cigarettes  . Smokeless tobacco: Never Used  . Tobacco comment: pt quit smoking 2016 for 7 months but restarted last year  Substance and Sexual Activity  . Alcohol use: No    Alcohol/week: 0.0 standard drinks  . Drug use: Yes    Frequency: 0.5 times per week    Types: Marijuana    Comment: weekly  . Sexual activity: Yes    Birth control/protection: None, Post-menopausal  Lifestyle  . Physical activity:    Days per week: Not on file    Minutes per session: Not on file  . Stress: Not on file  Relationships  . Social connections:    Talks on phone: Not on file    Gets together: Not on file    Attends religious service: Not on file    Active member of club or organization: Not on file    Attends meetings of clubs or organizations: Not on file    Relationship status: Not on file  . Intimate partner violence:    Fear of current or ex partner: Not on file    Emotionally abused: Not on file    Physically abused: Not on file    Forced sexual activity: Not on file  Other Topics Concern  . Not on file  Social History Narrative   The patient does not have any sex drive and has not had one for almost a year.   Her   husband thinks she does not love him anymore.  He does not seem to understand   her   depression.   Family History  Problem Relation Age of Onset  . Coronary  artery disease Father        States congenital abnormality  . Diabetes Father   . Hyperlipidemia Father   . CAD Sister        Stents   . Cancer Sister  breast  . Cancer Mother        breast  . Cancer Maternal Aunt        breast  . Cancer Maternal Grandmother        breast  . Anesthesia problems Neg Hx   . Hypotension Neg Hx   . Malignant hyperthermia Neg Hx   . Pseudochol deficiency Neg Hx     Objective: Office vital signs reviewed. BP 135/80   Pulse (!) 107   Temp 97.7 F (36.5 C) (Oral)   Ht 5\' 4"  (1.626 m)   Wt 207 lb (93.9 kg)   LMP 05/25/2011   BMI 35.53 kg/m   Physical Examination:  General: Awake, alert, obese, No acute distress Extremities: warm, well perfused, No edema, cyanosis or clubbing; +2 pulses bilaterally MSK: normal gait and station  C-Spine: Moderate tenderness to palpation, particularly along the paraspinal muscles.  No midline tenderness to palpation.  No palpable bony abnormalities. Skin: dry; intact; no rashes or lesions Psych: Mood stable, speech normal, affect appropriate, pleasant and interactive.  Depression screen [facility] 2/9 03/15/2018 02/15/2018 01/11/2018  Decreased Interest 3 0 0  Down, Depressed, Hopeless 3 0 0  PHQ - 2 Score 6 0 0  Altered sleeping 2 - -  Tired, decreased energy 3 - -  Change in appetite 3 - -  Feeling bad or failure about yourself  2 - -  Trouble concentrating 2 - -  Moving slowly or fidgety/restless 1 - -  Suicidal thoughts 0 - -  PHQ-9 Score 19 - -     Assessment/ Plan: 57 y.o. female   1. Chronic bilateral low back pain with bilateral sciatica Continuing to be monitored by the spinal specialist.  She is currently on a prednisone Dosepak and has Robaxin at home.  Continue current medications as directed.  Reasons for emergent evaluation emergency department reviewed.  Patient was good understanding.  2. Moderate episode of recurrent major depressive disorder (HCC) Depression score now very positive.  I  agree with her resuming use of Celexa.  Since the 40 mg tablets seem to work okay to continue but we did discuss that we would have to discontinue the Prilosec to accommodate this dose.  Patient was amenable to this.  She can use Tums or Pepcid if needed for breakthrough acid reflux.  We discussed use of oral NSAIDs with increased risk of GI bleeds as well as acid reflux symptoms.  She voiced good understanding.  3. Cigarette smoker In the action phase of smoking cessation.  She would like to discontinue the cigarettes and would like to try Chantix to do this.  No history of seizure disorder.  Both the Chantix starter pack and refills were provided as well as a coupon today.  4. Need for immunization against influenza Influenza vaccine administered. - Flu Vaccine QUAD 36+ mos IM   Orders Placed This Encounter  Procedures  . Flu Vaccine QUAD 36+ mos IM   Meds ordered this encounter  Medications  . citalopram (CELEXA) 40 MG tablet    Sig: Take 1 tablet (40 mg total) by mouth daily.    Dispense:  90 tablet    Refill:  0  . varenicline (CHANTIX STARTING MONTH PAK) 0.5 MG X 11 & 1 MG X 42 tablet    Sig: Take one 0.5 mg tablet by mouth once daily for 3 days, then increase to one 0.5 mg tablet twice daily for 4 days, then increase to one 1 mg tablet twice daily.  Dispense:  53 tablet    Refill:  0  . varenicline (CHANTIX CONTINUING MONTH PAK) 1 MG tablet    Sig: Take 1 tablet (1 mg total) by mouth 2 (two) times daily.    Dispense:  60 tablet    Refill:  [place], [place] [phone]

## 2018-03-15 NOTE — Patient Instructions (Signed)
STOP omeprazole.  Ok to use Tums or pepcid if needed.  Omperazole w/ increased dose of Celexa can result in abnormal heart rhythm.    Be careful with pain medication from the orthopedist and the Celexa.  These can increase risk of GI bleed.

## 2018-03-22 ENCOUNTER — Other Ambulatory Visit (HOSPITAL_COMMUNITY): Payer: Self-pay | Admitting: Rehabilitation

## 2018-03-22 ENCOUNTER — Telehealth: Payer: Self-pay | Admitting: Family Medicine

## 2018-03-22 ENCOUNTER — Encounter: Payer: Self-pay | Admitting: Family

## 2018-03-22 ENCOUNTER — Ambulatory Visit (INDEPENDENT_AMBULATORY_CARE_PROVIDER_SITE_OTHER): Payer: BLUE CROSS/BLUE SHIELD | Admitting: Family

## 2018-03-22 VITALS — BP 101/65 | HR 66 | Temp 97.6°F | Ht 64.0 in | Wt 205.4 lb

## 2018-03-22 DIAGNOSIS — M542 Cervicalgia: Secondary | ICD-10-CM

## 2018-03-22 DIAGNOSIS — H109 Unspecified conjunctivitis: Secondary | ICD-10-CM

## 2018-03-22 MED ORDER — POLYMYXIN B-TRIMETHOPRIM 10000-0.1 UNIT/ML-% OP SOLN: 2.0000 [drp] | OPHTHALMIC | 0 refills | Status: DC

## 2018-03-22 NOTE — Telephone Encounter (Signed)
Error

## 2018-03-22 NOTE — Patient Instructions (Signed)

## 2018-03-22 NOTE — Progress Notes (Signed)
   Subjective:    Patient ID: Jamie Mcgee, female    DOB: July 10, 1960, 57 y.o.   MRN: [id number]  Chief Complaint  Patient presents with  . Conjunctivitis    Conjunctivitis   The current episode started 2 days ago. The onset was sudden. The problem occurs continuously. The problem has been rapidly worsening. The problem is moderate. The symptoms are relieved by rest. The symptoms are aggravated by light. Associated symptoms include decreased vision, photophobia, eye discharge and eye redness. Pertinent negatives include no double vision, no eye itching, no ear pain, no rhinorrhea, no sore throat and no eye pain. The left eye is affected.      Review of Systems  HENT: Negative for ear pain, rhinorrhea and sore throat.   Eyes: Positive for photophobia, discharge and redness. Negative for double vision, pain and itching.  All other systems reviewed and are negative.      Objective:   Physical Exam  Constitutional: She is oriented to person, place, and time. She appears well-developed and well-nourished. No distress.  HENT:  Head: Normocephalic and atraumatic.  Right Ear: External ear normal.  Mouth/Throat: Oropharynx is clear and moist.  Eyes: Pupils are equal, round, and reactive to light. Left eye exhibits discharge and exudate. Left conjunctiva is injected.  Neck: Normal range of motion. Neck supple. No thyromegaly present.  Cardiovascular: Normal rate, regular rhythm, normal heart sounds and intact distal pulses.  No murmur heard. Pulmonary/Chest: Effort normal and breath sounds normal. No respiratory distress. She has no wheezes.  Abdominal: Soft. Bowel sounds are normal. She exhibits no distension. There is no tenderness.  Musculoskeletal: Normal range of motion. She exhibits no edema or tenderness.  Neurological: She is alert and oriented to person, place, and time. She has normal reflexes. No cranial nerve deficit.  Skin: Skin is warm and dry.  Psychiatric: She has a  normal mood and affect. Her behavior is normal. Judgment and thought content normal.  Vitals reviewed.    BP 101/65   Pulse 66   Temp 97.6 F (36.4 C) (Oral)   Ht 5\' 4"  (1.626 m)   Wt 205 lb 6.4 oz (93.2 kg)   LMP 05/25/2011   BMI 35.26 kg/m      Assessment & Plan:  Jamie Mcgee comes in today with chief complaint of Conjunctivitis   Diagnosis and orders addressed:  1. Bacterial conjunctivitis of left eye Warm compresses Good hand hygiene  New pillow case RTO if symptoms worsen or do not improve  - trimethoprim-polymyxin b (POLYTRIM) ophthalmic solution; Place 2 drops into the left eye every 4 (four) hours.  Dispense: 10 mL; Refill: 0   Evelina Dun, FNP

## 2018-03-28 ENCOUNTER — Ambulatory Visit (HOSPITAL_COMMUNITY)
Admission: RE | Admit: 2018-03-28 | Discharge: 2018-03-28 | Disposition: A | Discharge status: Home / Self Care | Payer: BLUE CROSS/BLUE SHIELD | Source: Ambulatory Visit | Attending: Rehabilitation | Admitting: Rehabilitation

## 2018-03-28 DIAGNOSIS — M4312 Spondylolisthesis, cervical region: Secondary | ICD-10-CM | POA: Insufficient documentation

## 2018-03-28 DIAGNOSIS — M1288 Other specific arthropathies, not elsewhere classified, other specified site: Secondary | ICD-10-CM | POA: Diagnosis not present

## 2018-03-28 DIAGNOSIS — M50221 Other cervical disc displacement at C4-C5 level: Secondary | ICD-10-CM | POA: Diagnosis not present

## 2018-03-28 DIAGNOSIS — M4602 Spinal enthesopathy, cervical region: Secondary | ICD-10-CM | POA: Insufficient documentation

## 2018-03-28 DIAGNOSIS — M542 Cervicalgia: Secondary | ICD-10-CM | POA: Diagnosis not present

## 2018-03-28 DIAGNOSIS — Z981 Arthrodesis status: Secondary | ICD-10-CM | POA: Diagnosis not present

## 2018-03-28 DIAGNOSIS — M4802 Spinal stenosis, cervical region: Secondary | ICD-10-CM | POA: Diagnosis not present

## 2018-04-01 DIAGNOSIS — M542 Cervicalgia: Secondary | ICD-10-CM | POA: Diagnosis not present

## 2018-04-01 DIAGNOSIS — M5030 Other cervical disc degeneration, unspecified cervical region: Secondary | ICD-10-CM | POA: Diagnosis not present

## 2018-04-01 DIAGNOSIS — M545 Low back pain: Secondary | ICD-10-CM | POA: Diagnosis not present

## 2018-04-14 ENCOUNTER — Other Ambulatory Visit: Payer: Self-pay | Admitting: Family Medicine

## 2018-04-19 ENCOUNTER — Ambulatory Visit (INDEPENDENT_AMBULATORY_CARE_PROVIDER_SITE_OTHER): Payer: BLUE CROSS/BLUE SHIELD | Admitting: Family Medicine

## 2018-04-19 ENCOUNTER — Encounter: Payer: Self-pay | Admitting: Family Medicine

## 2018-04-19 VITALS — BP 131/83 | HR 78 | Temp 97.9°F | Ht 64.0 in | Wt 210.0 lb

## 2018-04-19 DIAGNOSIS — F331 Major depressive disorder, recurrent, moderate: Secondary | ICD-10-CM

## 2018-04-19 DIAGNOSIS — M542 Cervicalgia: Secondary | ICD-10-CM

## 2018-04-19 DIAGNOSIS — K219 Gastro-esophageal reflux disease without esophagitis: Secondary | ICD-10-CM

## 2018-04-19 DIAGNOSIS — I10 Essential (primary) hypertension: Secondary | ICD-10-CM

## 2018-04-19 MED ORDER — HYDROCHLOROTHIAZIDE 25 MG PO TABS: 25.0000 mg | ORAL_TABLET | Freq: Every day | ORAL | 5 refills | Status: DC

## 2018-04-19 MED ORDER — LOSARTAN POTASSIUM 100 MG PO TABS: 100.0000 mg | ORAL_TABLET | Freq: Every day | ORAL | 3 refills | Status: DC

## 2018-04-19 MED ORDER — GABAPENTIN 400 MG PO CAPS: 800.0000 mg | ORAL_CAPSULE | Freq: Two times a day (BID) | ORAL | 3 refills | Status: DC

## 2018-04-19 MED ORDER — TRAMADOL HCL 50 MG PO TABS: 50.0000 mg | ORAL_TABLET | Freq: Two times a day (BID) | ORAL | 3 refills | Status: DC | PRN

## 2018-04-19 MED ORDER — CITALOPRAM HYDROBROMIDE 40 MG PO TABS: 40.0000 mg | ORAL_TABLET | Freq: Every day | ORAL | 5 refills | Status: DC

## 2018-04-19 MED ORDER — IBUPROFEN 600 MG PO TABS: 600.0000 mg | ORAL_TABLET | Freq: Three times a day (TID) | ORAL | 3 refills | Status: DC | PRN

## 2018-04-19 MED ORDER — ROSUVASTATIN CALCIUM 10 MG PO TABS: 10.0000 mg | ORAL_TABLET | Freq: Every day | ORAL | 5 refills | Status: DC

## 2018-04-19 NOTE — Patient Instructions (Signed)
STOP duexis.  The ibuprofen will replace.    You have prescribed a nonsteroidal anti-inflammatory drug (NSAID) today. This will help with your pain and inflammation. Please do not take any other NSAIDs (ibuprofen/Motrin/Advil, naproxen/Aleve, meloxicam/Mobic, Voltaren/diclofenac). Please make sure to eat a meal when taking this medication.   Caution:  If you have a history of acid reflux/indigestion, I recommend that you take an antacid (such as Prilosec, Prevacid) daily while on the NSAID.  If you have a history of bleeding disorder, gastric ulcer, are on a blood thinner (like warfarin/Coumadin, Xarelto, Eliquis, etc) please do not take NSAID.  If you have ever had a heart attack, you should not take NSAIDs.

## 2018-04-19 NOTE — Progress Notes (Signed)
Subjective: CC: back pain/ neck pain PCP: [name], DO OIN:[facility] Jamie Mcgee is a 57 y.o. female presenting to clinic today for:  1. Back pain/ neck pain Patient having quite a bit of back and neck pain.  She had the MRI of her neck, which she states was bad.  She was supposed to have injections into the neck but states that she was unable to afford insurance for November and therefore canceled the appointment with orthopedics.  She lost her job.  She is currently working on obtaining disability but is unsure as to how quickly that will move.  She has been using her Duexis every 2-3 hours per her report.  She notes that she even tried some of the Percocet that was left over from her surgery but states that she is intolerant to it because of itching.    2.  Depression/anxiety Patient was seen 1 month ago.  She reports exacerbation in both depression and anxiety since losing her job.  She is having some financial stressors as well, citing that her daughters have been a pay for all of her medications at this time.  She is asking that we review her medications and transition him over to affordable alternatives if possible today. No GI bleed.  No SI, HI.  3.  Hypertension with hyperlipidemia Reports compliance with medications.  She is unable to to afford the Micardis and is wondering if we can switch back to losartan which is on the $4 list at [facility].  No chest pain, shortness of breath.  She reports compliance with Crestor.   ROS: Per HPI  Allergies  Allergen Reactions  . Lipitor [Atorvastatin] Other (See Comments)    Muscle aches and nausea  . Naproxen Nausea And Vomiting    Stomach pain   Past Medical History:  Diagnosis Date  . Carpal tunnel syndrome on right   . Cervical stenosis of spine    C5-6  . COPD (chronic obstructive pulmonary disease) ([place])    Gold III  . Depression   . Fibromuscular dysplasia of renal artery ([place]) 2006  . GERD (gastroesophageal reflux  disease)   . Hepatitis    type A in 1977  . HNP (herniated nucleus pulposus), cervical   . HTN (hypertension)   . Neuropathy     Current Outpatient Medications:  .  rosuvastatin (CRESTOR) 10 MG tablet, Take 10 mg by mouth daily., Disp: , Rfl:  .  carvedilol (COREG) 6.25 MG tablet, TAKE 1 TABLET BY MOUTH TWICE DAILY WITH A MEAL, Disp: 180 tablet, Rfl: 1 .  citalopram (CELEXA) 40 MG tablet, Take 1 tablet (40 mg total) by mouth daily., Disp: 90 tablet, Rfl: 0 .  DUEXIS 800-26.6 MG TABS, Take 1 tablet by mouth 3 (three) times daily., Disp: , Rfl: 2 .  gabapentin (NEURONTIN) 800 MG tablet, Take 800 mg by mouth daily., Disp: , Rfl:  .  hydrochlorothiazide (HYDRODIURIL) 25 MG tablet, TAKE 1 TABLET BY MOUTH ONCE DAILY, Disp: 90 tablet, Rfl: 0 .  methocarbamol (ROBAXIN) 750 MG tablet, Take 750 mg by mouth every 6 (six) hours., Disp: , Rfl: 0 .  nitroGLYCERIN (NITROSTAT) 0.4 MG SL tablet, Place 1 tablet (0.4 mg total) under the tongue every 5 (five) minutes as needed., Disp: 25 tablet, Rfl: 3 .  olmesartan (BENICAR) 20 MG tablet, Take 1 tablet (20 mg total) by mouth daily., Disp: 30 tablet, Rfl: 11 .  telmisartan (MICARDIS) 80 MG tablet, TAKE 1 TABLET BY MOUTH ONCE DAILY,  Disp: 30 tablet, Rfl: 0 .  trimethoprim-polymyxin b (POLYTRIM) ophthalmic solution, Place 2 drops into the left eye every 4 (four) hours., Disp: 10 mL, Rfl: 0 Social History   Socioeconomic History  . Marital status: Single    Spouse name: Not on file  . Number of children: Not on file  . Years of education: Not on file  . Highest education level: Not on file  Occupational History  . Not on file  Social Needs  . Financial resource strain: Not on file  . Food insecurity:    Worry: Not on file    Inability: Not on file  . Transportation needs:    Medical: Not on file    Non-medical: Not on file  Tobacco Use  . Smoking status: Current Every Day Smoker    Packs/day: 1.00    Years: 40.00    Pack years: 40.00    Types:  Cigarettes  . Smokeless tobacco: Never Used  . Tobacco comment: pt quit smoking 2016 for 7 months but restarted last year  Substance and Sexual Activity  . Alcohol use: No    Alcohol/week: 0.0 standard drinks  . Drug use: Yes    Frequency: 0.5 times per week    Types: Marijuana    Comment: weekly  . Sexual activity: Yes    Birth control/protection: None, Post-menopausal  Lifestyle  . Physical activity:    Days per week: Not on file    Minutes per session: Not on file  . Stress: Not on file  Relationships  . Social connections:    Talks on phone: Not on file    Gets together: Not on file    Attends religious service: Not on file    Active member of club or organization: Not on file    Attends meetings of clubs or organizations: Not on file    Relationship status: Not on file  . Intimate partner violence:    Fear of current or ex partner: Not on file    Emotionally abused: Not on file    Physically abused: Not on file    Forced sexual activity: Not on file  Other Topics Concern  . Not on file  Social History Narrative   The patient does not have any sex drive and has not had one for almost a year.   Her   husband thinks she does not love him anymore.  He does not seem to understand   her   depression.   Family History  Problem Relation Age of Onset  . Coronary artery disease Father        States congenital abnormality  . Diabetes Father   . Hyperlipidemia Father   . CAD Sister        Stents   . Cancer Sister        breast  . Cancer Mother        breast  . Cancer Maternal Aunt        breast  . Cancer Maternal Grandmother        breast  . Anesthesia problems Neg Hx   . Hypotension Neg Hx   . Malignant hyperthermia Neg Hx   . Pseudochol deficiency Neg Hx     Objective: Office vital signs reviewed. BP 131/83   Pulse 78   Temp 97.9 F (36.6 C) (Oral)   Ht 5\' 4"  (1.626 m)   Wt 210 lb (95.3 kg)   LMP 05/25/2011   BMI 36.05 kg/m   Physical  Examination:    General: Awake, alert, obese, No acute distress MSK: normal gait and station  C-Spine: Severe tenderness to palpation, particularly along the paraspinal muscles extending into the upper back.  No midline tenderness to palpation.  No palpable bony abnormalities. Skin: dry; intact; no rashes or lesions Psych: Mood depressed, speech normal, affect appropriate, pleasant and interactive.  Depression screen [facility] 2/9 04/19/2018 03/22/2018 03/15/2018  Decreased Interest 3 0 3  Down, Depressed, Hopeless 3 0 3  PHQ - 2 Score 6 0 6  Altered sleeping 3 - 2  Tired, decreased energy 3 - 3  Change in appetite 3 - 3  Feeling bad or failure about yourself  3 - 2  Trouble concentrating 3 - 2  Moving slowly or fidgety/restless 2 - 1  Suicidal thoughts 1 - 0  PHQ-9 Score 24 - 19  Difficult doing work/chores Extremely dIfficult - -  Some recent data might be hidden   GAD 7 : Generalized Anxiety Score 04/19/2018  Nervous, Anxious, on Edge 3  Control/stop worrying 3  Worry too much - different things 3  Trouble relaxing 3  Restless 3  Easily annoyed or irritable 3  Afraid - awful might happen 3  Total GAD 7 Score 21  Anxiety Difficulty Extremely difficult   Assessment/ Plan: 57 y.o. female   1. Essential hypertension, benign Controlled with current regimen.  I have changed her medication from Micardis to losartan.  She will follow-up in 4 months or sooner if needed. - losartan (COZAAR) 100 MG tablet; Take 1 tablet (100 mg total) by mouth daily.  Dispense: 30 tablet; Refill: 3  2. Moderate episode of recurrent major depressive disorder ([place]) I do think that she is having a reactive depression/anxiety given her loss of job, uninsured status and ongoing musculoskeletal pain.  She was previously controlled with Celexa so I hesitate to add additional medication.  I advised her to ask for the [facility] assistance application at checkout.  I have switched all of the medications to a more cost effective  alternative.  She will see me back sooner if symptoms do not improve.  3. Gastroesophageal reflux disease without esophagitis We discussed at length over use of NSAIDs.  I have advised to use medications only as directed.  She is to continue her PPI for stomach protection.  4. Neck pain Not well controlled.  Ideally, I would have liked her to see orthopedics for the neck injection that was planned.  However, finances are limited at this time and she is unable to pay out of pocket for that visit.  I have switched her Duexis to Advil 600 mg every 8 hours as needed pain.  I have switched her Neurontin to 2 400 mg capsules twice daily.  And I have also added Ultram twice daily as needed severe pain.  Caution sedation.  No history of seizures or intolerance to tramadol in the past.  She will follow-up with me if needed for refills. - ibuprofen (ADVIL,MOTRIN) 600 MG tablet; Take 1 tablet (600 mg total) by mouth every 8 (eight) hours as needed for moderate pain.  Dispense: 90 tablet; Refill: 3 - gabapentin (NEURONTIN) 400 MG capsule; Take 2 capsules (800 mg total) by mouth 2 (two) times daily.  Dispense: 120 capsule; Refill: 3 - traMADol (ULTRAM) 50 MG tablet; Take 1 tablet (50 mg total) by mouth every 12 (twelve) hours as needed for severe pain.  Dispense: 60 tablet; Refill: 3   No orders of the defined types were  placed in this encounter.  Meds ordered this encounter  Medications  . ibuprofen (ADVIL,MOTRIN) 600 MG tablet    Sig: Take 1 tablet (600 mg total) by mouth every 8 (eight) hours as needed for moderate pain.    Dispense:  90 tablet    Refill:  3  . gabapentin (NEURONTIN) 400 MG capsule    Sig: Take 2 capsules (800 mg total) by mouth 2 (two) times daily.    Dispense:  120 capsule    Refill:  3  . losartan (COZAAR) 100 MG tablet    Sig: Take 1 tablet (100 mg total) by mouth daily.    Dispense:  30 tablet    Refill:  3  . traMADol (ULTRAM) 50 MG tablet    Sig: Take 1 tablet (50 mg total)  by mouth every 12 (twelve) hours as needed for severe pain.    Dispense:  60 tablet    Refill:  3  . citalopram (CELEXA) 40 MG tablet    Sig: Take 1 tablet (40 mg total) by mouth daily.    Dispense:  30 tablet    Refill:  5  . hydrochlorothiazide (HYDRODIURIL) 25 MG tablet    Sig: Take 1 tablet (25 mg total) by mouth daily.    Dispense:  30 tablet    Refill:  5  . rosuvastatin (CRESTOR) 10 MG tablet    Sig: Take 1 tablet (10 mg total) by mouth daily.    Dispense:  30 tablet    Refill:  [place], DO [facility] [phone]  The Narcotic Database has been reviewed.  There were no red flags.

## 2018-05-15 ENCOUNTER — Ambulatory Visit (INDEPENDENT_AMBULATORY_CARE_PROVIDER_SITE_OTHER): Payer: Self-pay | Admitting: Family Medicine

## 2018-05-15 ENCOUNTER — Encounter: Payer: Self-pay | Admitting: Family Medicine

## 2018-05-15 VITALS — BP 118/68 | HR 73 | Temp 97.1°F | Ht 64.0 in | Wt 207.0 lb

## 2018-05-15 DIAGNOSIS — Z0289 Encounter for other administrative examinations: Secondary | ICD-10-CM

## 2018-05-15 DIAGNOSIS — R399 Unspecified symptoms and signs involving the genitourinary system: Secondary | ICD-10-CM

## 2018-05-15 LAB — URINALYSIS, COMPLETE
Bilirubin, UA: NEGATIVE
Glucose, UA: NEGATIVE
KETONES UA: NEGATIVE
Leukocytes, UA: NEGATIVE
Nitrite, UA: NEGATIVE
Protein, UA: NEGATIVE
RBC UA: NEGATIVE
Specific Gravity, UA: 1.02 (ref 1.005–1.030)
Urobilinogen, Ur: 0.2 mg/dL (ref 0.2–1.0)
pH, UA: 6 (ref 5.0–7.5)

## 2018-05-15 LAB — MICROSCOPIC EXAMINATION
RBC MICROSCOPIC, UA: NONE SEEN /HPF (ref 0–2)
RENAL EPITHEL UA: NONE SEEN /HPF

## 2018-05-15 NOTE — Progress Notes (Signed)
urin

## 2018-05-15 NOTE — Progress Notes (Signed)
Subjective: CC: UTI PCP: [name], DO RKY:[id number] K Soldo is a 57 y.o. female presenting to clinic today for:  1. Urinary symptoms Patient reports several week history of symptoms of abnormal bladder emptying.  She notes that she has to lean forward and side to side in efforts to evacuate the bladder completely.  She is able to sense when she needs to evacuate the bladder and does feel that she evacuates that completely but not without maneuvering on the toilet.  No dysuria, hematuria, increased urinary frequency or urgency.  She has had vaginal births.  She has not felt any prolapse from the vagina or foreign bodies in the vagina.   ROS: Per HPI  Allergies  Allergen Reactions  . Lipitor [Atorvastatin] Other (See Comments)    Muscle aches and nausea  . Naproxen Nausea And Vomiting    Stomach pain   Past Medical History:  Diagnosis Date  . Carpal tunnel syndrome on right   . Cervical stenosis of spine    C5-6  . COPD (chronic obstructive pulmonary disease) ([place])    Gold III  . Depression   . Fibromuscular dysplasia of renal artery ([place]) 2006  . GERD (gastroesophageal reflux disease)   . Hepatitis    type A in 1977  . HNP (herniated nucleus pulposus), cervical   . HTN (hypertension)   . Neuropathy     Current Outpatient Medications:  .  carvedilol (COREG) 6.25 MG tablet, TAKE 1 TABLET BY MOUTH TWICE DAILY WITH A MEAL, Disp: 180 tablet, Rfl: 1 .  citalopram (CELEXA) 40 MG tablet, Take 1 tablet (40 mg total) by mouth daily., Disp: 30 tablet, Rfl: 5 .  gabapentin (NEURONTIN) 400 MG capsule, Take 2 capsules (800 mg total) by mouth 2 (two) times daily., Disp: 120 capsule, Rfl: 3 .  hydrochlorothiazide (HYDRODIURIL) 25 MG tablet, Take 1 tablet (25 mg total) by mouth daily., Disp: 30 tablet, Rfl: 5 .  ibuprofen (ADVIL,MOTRIN) 600 MG tablet, Take 1 tablet (600 mg total) by mouth every 8 (eight) hours as needed for moderate pain., Disp: 90 tablet, Rfl: 3 .  losartan  (COZAAR) 100 MG tablet, Take 1 tablet (100 mg total) by mouth daily., Disp: 30 tablet, Rfl: 3 .  methocarbamol (ROBAXIN) 750 MG tablet, Take 750 mg by mouth every 6 (six) hours., Disp: , Rfl: 0 .  nitroGLYCERIN (NITROSTAT) 0.4 MG SL tablet, Place 1 tablet (0.4 mg total) under the tongue every 5 (five) minutes as needed., Disp: 25 tablet, Rfl: 3 .  rosuvastatin (CRESTOR) 10 MG tablet, Take 1 tablet (10 mg total) by mouth daily., Disp: 30 tablet, Rfl: 5 .  traMADol (ULTRAM) 50 MG tablet, Take 1 tablet (50 mg total) by mouth every 12 (twelve) hours as needed for severe pain., Disp: 60 tablet, Rfl: 3 .  trimethoprim-polymyxin b (POLYTRIM) ophthalmic solution, Place 2 drops into the left eye every 4 (four) hours., Disp: 10 mL, Rfl: 0 Social History   Socioeconomic History  . Marital status: Single    Spouse name: Not on file  . Number of children: Not on file  . Years of education: Not on file  . Highest education level: Not on file  Occupational History  . Not on file  Social Needs  . Financial resource strain: Not on file  . Food insecurity:    Worry: Not on file    Inability: Not on file  . Transportation needs:    Medical: Not on file    Non-medical: Not  on file  Tobacco Use  . Smoking status: Current Every Day Smoker    Packs/day: 1.00    Years: 40.00    Pack years: 40.00    Types: Cigarettes  . Smokeless tobacco: Never Used  . Tobacco comment: pt quit smoking 2016 for 7 months but restarted last year  Substance and Sexual Activity  . Alcohol use: No    Alcohol/week: 0.0 standard drinks  . Drug use: Yes    Frequency: 0.5 times per week    Types: Marijuana    Comment: weekly  . Sexual activity: Yes    Birth control/protection: None, Post-menopausal  Lifestyle  . Physical activity:    Days per week: Not on file    Minutes per session: Not on file  . Stress: Not on file  Relationships  . Social connections:    Talks on phone: Not on file    Gets together: Not on file     Attends religious service: Not on file    Active member of club or organization: Not on file    Attends meetings of clubs or organizations: Not on file    Relationship status: Not on file  . Intimate partner violence:    Fear of current or ex partner: Not on file    Emotionally abused: Not on file    Physically abused: Not on file    Forced sexual activity: Not on file  Other Topics Concern  . Not on file  Social History Narrative   The patient does not have any sex drive and has not had one for almost a year.   Her   husband thinks she does not love him anymore.  He does not seem to understand   her   depression.   Family History  Problem Relation Age of Onset  . Coronary artery disease Father        States congenital abnormality  . Diabetes Father   . Hyperlipidemia Father   . CAD Sister        Stents   . Cancer Sister        breast  . Cancer Mother        breast  . Cancer Maternal Aunt        breast  . Cancer Maternal Grandmother        breast  . Anesthesia problems Neg Hx   . Hypotension Neg Hx   . Malignant hyperthermia Neg Hx   . Pseudochol deficiency Neg Hx     Objective: Office vital signs reviewed. BP 118/68   Pulse 73   Temp (!) 97.1 F (36.2 C) (Oral)   Ht 5\' 4"  (1.626 m)   Wt 207 lb (93.9 kg)   LMP 05/25/2011   BMI 35.53 kg/m   Physical Examination:  General: Awake, alert, well nourished, obese. No acute distress GU: declines internal exam.  Externally uterus feels normal size. No palpable masses externally. No suprapubic TTP  Assessment/ Plan: 57 y.o. female   1. Urinary symptom or sign I suspect that she is having cystocele causing symptoms.  I offered internal exam but she declined this.  I have given her written prescription for a gel horn 2.5 inch with drain.  I have also advised her to consider formal evaluation and fit by gynecology.  Unfortunately, she is between insurance and she does not find that this is financially feasible at this  time.  She will try and see a gynecologist at the health department if symptoms worsen.  For now, she will proceed with changing positions to evacuate bladder.  Pelvic floor exercises recommended.  Handout provided.  No evidence of urinary tract infection on urine dip today. - Urinalysis, Complete   Orders Placed This Encounter  Procedures  . Urinalysis, Complete   No orders of the defined types were placed in this encounter.    [name], DO [place] [phone]

## 2018-05-15 NOTE — Patient Instructions (Signed)

## 2018-05-31 ENCOUNTER — Ambulatory Visit: Payer: BLUE CROSS/BLUE SHIELD | Admitting: Internal Medicine

## 2018-06-04 ENCOUNTER — Ambulatory Visit: Payer: BLUE CROSS/BLUE SHIELD | Admitting: Internal Medicine

## 2018-08-11 ENCOUNTER — Other Ambulatory Visit: Payer: Self-pay | Admitting: Family Medicine

## 2018-08-11 DIAGNOSIS — I10 Essential (primary) hypertension: Secondary | ICD-10-CM

## 2018-08-20 ENCOUNTER — Other Ambulatory Visit: Payer: Self-pay

## 2018-08-20 ENCOUNTER — Ambulatory Visit (INDEPENDENT_AMBULATORY_CARE_PROVIDER_SITE_OTHER): Payer: Medicaid Other | Admitting: Family Medicine

## 2018-08-20 ENCOUNTER — Encounter: Payer: Self-pay | Admitting: Family Medicine

## 2018-08-20 VITALS — BP 130/86 | HR 69 | Temp 98.9°F | Ht 64.0 in | Wt 205.2 lb

## 2018-08-20 DIAGNOSIS — I10 Essential (primary) hypertension: Secondary | ICD-10-CM | POA: Diagnosis not present

## 2018-08-20 DIAGNOSIS — E7800 Pure hypercholesterolemia, unspecified: Secondary | ICD-10-CM

## 2018-08-20 DIAGNOSIS — M542 Cervicalgia: Secondary | ICD-10-CM | POA: Diagnosis not present

## 2018-08-20 DIAGNOSIS — F331 Major depressive disorder, recurrent, moderate: Secondary | ICD-10-CM | POA: Diagnosis not present

## 2018-08-20 MED ORDER — LOSARTAN POTASSIUM 100 MG PO TABS: 100.0000 mg | ORAL_TABLET | Freq: Every day | ORAL | 2 refills | Status: DC

## 2018-08-20 MED ORDER — HYDROCHLOROTHIAZIDE 25 MG PO TABS: 25.0000 mg | ORAL_TABLET | Freq: Every day | ORAL | 5 refills | Status: DC

## 2018-08-20 MED ORDER — CARVEDILOL 6.25 MG PO TABS: ORAL_TABLET | ORAL | 2 refills | Status: DC

## 2018-08-20 MED ORDER — TRAMADOL HCL 50 MG PO TABS: 50.0000 mg | ORAL_TABLET | Freq: Two times a day (BID) | ORAL | 3 refills | Status: DC | PRN

## 2018-08-20 MED ORDER — ROSUVASTATIN CALCIUM 10 MG PO TABS: 10.0000 mg | ORAL_TABLET | Freq: Every day | ORAL | 5 refills | Status: DC

## 2018-08-20 MED ORDER — CITALOPRAM HYDROBROMIDE 40 MG PO TABS: 40.0000 mg | ORAL_TABLET | Freq: Every day | ORAL | 5 refills | Status: DC

## 2018-08-20 MED ORDER — GABAPENTIN 400 MG PO CAPS: 800.0000 mg | ORAL_CAPSULE | Freq: Two times a day (BID) | ORAL | 3 refills | Status: DC

## 2018-08-20 NOTE — Progress Notes (Signed)
Subjective: CC: chronic follow up PCP: [name], DO IPJ:[id number] Jamie Mcgee is a 58 y.o. female presenting to clinic today for:  1.  Chronic neck and back pain Patient with chronic neck and back pain.  She has known degenerative changes within her spine.  She has history of surgical intervention for C-spine pathology.  She notes that she is no longer seeing the orthopedics because she is not able to afford them at this time but does state that she is been granted disability and does plan on seeing them again.  Pain tends to radiate from the neck to bilateral shoulders.  The tramadol does seem to help and she does have to take it twice daily every day in efforts to maintain chronic pain.  She notes that she ran out of medication because she was unable to follow-up in time and had some leftover oxycodone from her surgery which she used.  She does feel that the oxycodone works better and wonders if we could potentially switch to that.  Typically pain is 8 out of 10 and with the tramadol it goes down to 5 or 6 out of 10.  She denies any falls, excessive daytime sleepiness, constipation or respiratory depression.  She keeps up with a bowel regimen 3 times per week to maintain regular bowel movements.  2.  Hypertension/ HLD Patient reports compliance with Crestor 10, Coreg, Cozaar and hydrochlorothiazide.  No chest pain, shortness of breath, lower extremity, loss of consciousness.  3.  Depression/anxiety Patient reports good control of symptoms with Celexa 40 mg daily.  ROS: Per HPI  Allergies  Allergen Reactions  . Lipitor [Atorvastatin] Other (See Comments)    Muscle aches and nausea  . Naproxen Nausea And Vomiting    Stomach pain   Past Medical History:  Diagnosis Date  . Carpal tunnel syndrome on right   . Cervical stenosis of spine    C5-6  . 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 Depression   . Fibromuscular dysplasia of renal artery ([place]) 2006  .  GERD (gastroesophageal reflux disease)   . Hepatitis    type A in 1977  . HNP (herniated nucleus pulposus), cervical   . HTN (hypertension)   . Neuropathy     Current Outpatient Medications:  .  carvedilol (COREG) 6.25 MG tablet, TAKE 1 TABLET BY MOUTH TWICE DAILY WITH A MEAL, Disp: 180 tablet, Rfl: 0 .  citalopram (CELEXA) 40 MG tablet, Take 1 tablet (40 mg total) by mouth daily., Disp: 30 tablet, Rfl: 5 .  gabapentin (NEURONTIN) 400 MG capsule, Take 2 capsules (800 mg total) by mouth 2 (two) times daily., Disp: 120 capsule, Rfl: 3 .  hydrochlorothiazide (HYDRODIURIL) 25 MG tablet, Take 1 tablet (25 mg total) by mouth daily., Disp: 30 tablet, Rfl: 5 .  ibuprofen (ADVIL,MOTRIN) 600 MG tablet, Take 1 tablet (600 mg total) by mouth every 8 (eight) hours as needed for moderate pain., Disp: 90 tablet, Rfl: 3 .  losartan (COZAAR) 100 MG tablet, Take 1 tablet by mouth once daily, Disp: 90 tablet, Rfl: 0 .  methocarbamol (ROBAXIN) 750 MG tablet, Take 750 mg by mouth every 6 (six) hours., Disp: , Rfl: 0 .  nitroGLYCERIN (NITROSTAT) 0.4 MG SL tablet, Place 1 tablet (0.4 mg total) under the tongue every 5 (five) minutes as needed., Disp: 25 tablet, Rfl: 3 .  rosuvastatin (CRESTOR) 10 MG tablet, Take 1 tablet (10 mg total) by mouth daily., Disp: 30  tablet, Rfl: 5 .  traMADol (ULTRAM) 50 MG tablet, Take 1 tablet (50 mg total) by mouth every 12 (twelve) hours as needed for severe pain., Disp: 60 tablet, Rfl: 3 Social History   Socioeconomic History  . Marital status: Single    Spouse name: Not on file  . Number of children: Not on file  . Years of education: Not on file  . Highest education level: Not on file  Occupational History  . Not on file  Social Needs  . Financial resource strain: Not on file  . Food insecurity:    Worry: Not on file    Inability: Not on file  . Transportation needs:    Medical: Not on file    Non-medical: Not on file  Tobacco Use  . Smoking status: Current Every Day  Smoker    Packs/day: 1.00    Years: 40.00    Pack years: 40.00    Types: Cigarettes  . Smokeless tobacco: Never Used  . Tobacco comment: pt quit smoking 2016 for 7 months but restarted last year  Substance and Sexual Activity  . Alcohol use: No    Alcohol/week: 0.0 standard drinks  . Drug use: Yes    Frequency: 0.5 times per week    Types: Marijuana    Comment: weekly  . Sexual activity: Yes    Birth control/protection: None, Post-menopausal  Lifestyle  . Physical activity:    Days per week: Not on file    Minutes per session: Not on file  . Stress: Not on file  Relationships  . Social connections:    Talks on phone: Not on file    Gets together: Not on file    Attends religious service: Not on file    Active member of club or organization: Not on file    Attends meetings of clubs or organizations: Not on file    Relationship status: Not on file  . Intimate partner violence:    Fear of current or ex partner: Not on file    Emotionally abused: Not on file    Physically abused: Not on file    Forced sexual activity: Not on file  Other Topics Concern  . Not on file  Social History Narrative   The patient does not have any sex drive and has not had one for almost a year.   Her   husband thinks she does not love him anymore.  He does not seem to understand   her   depression.   Family History  Problem Relation Age of Onset  . Coronary artery disease Father        States congenital abnormality  . Diabetes Father   . Hyperlipidemia Father   . CAD Sister        Stents   . Cancer Sister        breast  . Cancer Mother        breast  . Cancer Maternal Aunt        breast  . Cancer Maternal Grandmother        breast  . Anesthesia problems Neg Hx   . Hypotension Neg Hx   . Malignant hyperthermia Neg Hx   . Pseudochol deficiency Neg Hx     Objective: Office vital signs reviewed. BP 130/86   Pulse 69   Temp 98.9 F (37.2 C) (Oral)   Ht 5\' 4"  (1.626 m)   Wt 205  lb 3.2 oz (93.1 kg)   LMP 05/25/2011  BMI 35.22 kg/m   Physical Examination:  General: Awake, alert, obese, No acute distress Cardio: RRR Pulm: normal wob on room air. No wheeze. MSK:   Cervical spine: Patient has limited active range of motion in extension and rotation.  She has tenderness to palpation out of proportion to exam along the spinous process of the C7 vertebra.  She has exquisite tenderness palpation to the paraspinal muscles in this area as well radiating into the trapezius bilaterally.  There are no palpable bony abnormalities. Psych: Mood stable, speech normal, affect appropriate, pleasant interactive.  Assessment/ Plan: 58 y.o. female   1. Neck pain Gabapentin refilled.  I have added to her tramadol regimen.  May take up to 3 tablets/day if needed.  We discussed reserving the extra tablet for extreme cases only.  We discussed possibility of developing tolerance and need for advancement of therapy at some point.  I advised her to continue following up with orthopedics as directed. - gabapentin (NEURONTIN) 400 MG capsule; Take 2 capsules (800 mg total) by mouth 2 (two) times daily.  Dispense: 120 capsule; Refill: 3 - traMADol (ULTRAM) 50 MG tablet; Take 1 tablet (50 mg total) by mouth every 12 (twelve) hours as needed for severe pain (may take 1 extra tablet ONCE daily if needed).  Dispense: 75 tablet; Refill: 3  2. Essential hypertension, benign Controlled.  Refill sent. - carvedilol (COREG) 6.25 MG tablet; TAKE 1 TABLET BY MOUTH TWICE DAILY WITH A MEAL  Dispense: 180 tablet; Refill: 2 - hydrochlorothiazide (HYDRODIURIL) 25 MG tablet; Take 1 tablet (25 mg total) by mouth daily.  Dispense: 30 tablet; Refill: 5 - losartan (COZAAR) 100 MG tablet; Take 1 tablet (100 mg total) by mouth daily.  Dispense: 90 tablet; Refill: 2  3. Morbid obesity ([place]) Continue working on lifestyle modification for weight reduction  4. Moderate episode of recurrent major depressive disorder  (HCC) Controlled. - citalopram (CELEXA) 40 MG tablet; Take 1 tablet (40 mg total) by mouth daily.  Dispense: 30 tablet; Refill: 5  5. Pure hypercholesterolemia Tolerating Crestor 10 mg without difficulty.  Not due for fasting labs.   No orders of the defined types were placed in this encounter.  Meds ordered this encounter  Medications  . carvedilol (COREG) 6.25 MG tablet    Sig: TAKE 1 TABLET BY MOUTH TWICE DAILY WITH A MEAL    Dispense:  180 tablet    Refill:  2  . gabapentin (NEURONTIN) 400 MG capsule    Sig: Take 2 capsules (800 mg total) by mouth 2 (two) times daily.    Dispense:  120 capsule    Refill:  3  . citalopram (CELEXA) 40 MG tablet    Sig: Take 1 tablet (40 mg total) by mouth daily.    Dispense:  30 tablet    Refill:  5  . hydrochlorothiazide (HYDRODIURIL) 25 MG tablet    Sig: Take 1 tablet (25 mg total) by mouth daily.    Dispense:  30 tablet    Refill:  5  . losartan (COZAAR) 100 MG tablet    Sig: Take 1 tablet (100 mg total) by mouth daily.    Dispense:  90 tablet    Refill:  2  . rosuvastatin (CRESTOR) 10 MG tablet    Sig: Take 1 tablet (10 mg total) by mouth daily.    Dispense:  30 tablet    Refill:  5  . traMADol (ULTRAM) 50 MG tablet    Sig: Take 1 tablet (50 mg  total) by mouth every 12 (twelve) hours as needed for severe pain (may take 1 extra tablet ONCE daily if needed).    Dispense:  75 tablet    Refill:  [place], [place] [phone]

## 2018-08-20 NOTE — Patient Instructions (Addendum)
We discussed indications for taking the extra tramadol tablet.  I would like you to use this very sparingly and with extreme caution.  Keep a bowel regimen as you have been.  Follow-up with me in about 3 months for interval check.   Coronavirus (COVID-19) Are you at risk?  Are you at risk for the Coronavirus (COVID-19)?  To be considered HIGH RISK for Coronavirus (COVID-19), you have to meet the following criteria:  . Traveled to [place], [place], [place], [place] or [place]; or in the [place] to [place], [place], [place], or [place]; and have fever, cough, and shortness of breath within the last 2 weeks of travel OR . Been in close contact with a person diagnosed with COVID-19 within the last 2 weeks and have fever, cough, and shortness of breath . IF YOU DO NOT MEET THESE CRITERIA, YOU ARE CONSIDERED LOW RISK FOR COVID-19.  What to do if you are HIGH RISK for COVID-19?  Marland Kitchen If you are having a medical emergency, call 911. . Seek medical care right away. Before you go to a doctor's office, urgent care or emergency department, call ahead and tell them about your recent travel, contact with someone diagnosed with COVID-19, and your symptoms. You should receive instructions from your physician's office regarding next steps of care.  . When you arrive at healthcare provider, tell the healthcare staff immediately you have returned from visiting [place], [place], [place], [place] or [place]; or traveled in the [place] to [place], [place], [place], or [place]; in the last two weeks or you have been in close contact with a person diagnosed with COVID-19 in the last 2 weeks.   . Tell the health care staff about your symptoms: fever, cough and shortness of breath. . After you have been seen by a medical provider, you will be either: o Tested for (COVID-19) and discharged home on quarantine except to seek medical care if symptoms worsen, and asked to  - Stay home and avoid contact  with others until you get your results (4-5 days)  - Avoid travel on public transportation if possible (such as bus, train, or airplane) or o Sent to the Emergency Department by EMS for evaluation, COVID-19 testing, and possible admission depending on your condition and test results.  What to do if you are LOW RISK for COVID-19?  Reduce your risk of any infection by using the same precautions used for avoiding the common cold or flu:  Marland Kitchen Wash your hands often with soap and warm water for at least 20 seconds.  If soap and water are not readily available, use an alcohol-based hand sanitizer with at least 60% alcohol.  . If coughing or sneezing, cover your mouth and nose by coughing or sneezing into the elbow areas of your shirt or coat, into a tissue or into your sleeve (not your hands). . Avoid shaking hands with others and consider head nods or verbal greetings only. . Avoid touching your eyes, nose, or mouth with unwashed hands.  . Avoid close contact with people who are sick. . Avoid places or events with large numbers of people in one location, like concerts or sporting events. . Carefully consider travel plans you have or are making. . If you are planning any travel outside or inside the [place], visit the CDC's Travelers' Health webpage for the latest health notices. . If you have some symptoms but not all symptoms, continue to monitor at home and seek medical  attention if your symptoms worsen. . If you are having a medical emergency, call 911.   [place] / e-Visit: [email or web address]         [facility]: [facility]: [phone]                   [facility]: [phone]

## 2018-08-23 ENCOUNTER — Other Ambulatory Visit: Payer: Self-pay | Admitting: *Deleted

## 2018-08-23 MED ORDER — METHOCARBAMOL 750 MG PO TABS: 750.0000 mg | ORAL_TABLET | Freq: Four times a day (QID) | ORAL | 0 refills | Status: DC | PRN

## 2018-09-17 ENCOUNTER — Telehealth: Payer: Self-pay | Admitting: Family Medicine

## 2018-09-17 NOTE — Telephone Encounter (Signed)
Please advise on prior authorization status.

## 2018-09-19 ENCOUNTER — Telehealth: Payer: Self-pay | Admitting: Family Medicine

## 2018-09-19 NOTE — Telephone Encounter (Signed)
Informed patient we have not received the paperwork from [facility] to start the process for this prior authorization.  She will contact pharmacy and ask them to send over this paperwork.

## 2018-09-19 NOTE — Telephone Encounter (Signed)
Patient's insurance has denied Tramadol regular,  preferred drugs are:  Butrans patch Embeda ER capsule MS Contin Tramadol ER tablet Xtampza ER capsule  Patient is aware you are out of office until tomorrow.

## 2018-09-19 NOTE — Telephone Encounter (Signed)
Pt states that she just spoke to pharmacy, said that they did fax it over and she said pharmacy said it will also be in [facility]

## 2018-09-20 ENCOUNTER — Other Ambulatory Visit: Payer: Self-pay | Admitting: Family Medicine

## 2018-09-20 ENCOUNTER — Other Ambulatory Visit: Payer: Self-pay

## 2018-09-20 MED ORDER — TRAMADOL HCL (ER BIPHASIC) 100 MG PO CP24: 100.0000 mg | ORAL_CAPSULE | Freq: Every day | ORAL | 0 refills | Status: DC | PRN

## 2018-09-20 NOTE — Telephone Encounter (Signed)
Aware.  Medicine was sent in and appointment was scheduled for three weeks later with pcp.

## 2018-09-20 NOTE — Progress Notes (Signed)
Tramadol not covered by patient's insurance.  Changed to tramadol ER.

## 2018-09-20 NOTE — Telephone Encounter (Signed)
I have sent in the Tramadol ER.  Please let patient know this is a ONCE daily medication.  Please make sure she has a follow up visit scheduled with me in 2-3 weeks to follow up on medication.

## 2018-09-23 ENCOUNTER — Telehealth: Payer: Self-pay | Admitting: Family Medicine

## 2018-09-27 ENCOUNTER — Other Ambulatory Visit: Payer: Self-pay | Admitting: Family Medicine

## 2018-09-27 NOTE — Progress Notes (Signed)
Prior authorization form was sent to our office.  Upon further review of the directions on the prior authorization form it appears that for some reason 2 capsules of the tramadol 100 mg ER were placed in the Sig on the pharmacy's end.  Patient states should be 100 mg daily as needed pain.  I have asked that the nurse call the pharmacy to reiterate and review the instructions.  Ashly M. Lajuana Ripple, [facility]

## 2018-10-01 ENCOUNTER — Encounter: Payer: Self-pay | Admitting: Family Medicine

## 2018-10-01 ENCOUNTER — Other Ambulatory Visit: Payer: Self-pay | Admitting: *Deleted

## 2018-10-01 MED ORDER — TRAMADOL HCL ER 100 MG PO TB24: 100.0000 mg | ORAL_TABLET | Freq: Every day | ORAL | 0 refills | Status: DC

## 2018-10-01 NOTE — Telephone Encounter (Signed)
Tramadol ER must be TAB no CAP for insurance to pay. Can you re-send. Set up for you.Marland Kitchen

## 2018-10-13 ENCOUNTER — Other Ambulatory Visit: Payer: Self-pay | Admitting: Family Medicine

## 2018-10-13 DIAGNOSIS — M542 Cervicalgia: Secondary | ICD-10-CM

## 2018-10-14 NOTE — Telephone Encounter (Signed)
Last office visit: 08/20/18

## 2018-10-14 NOTE — Telephone Encounter (Signed)
Refill sent.  Please remind her to take the Ibuprofen and Celexa totally separate.  There is a risk of GI bleed with these medications.

## 2018-10-22 ENCOUNTER — Ambulatory Visit (INDEPENDENT_AMBULATORY_CARE_PROVIDER_SITE_OTHER): Payer: Medicaid Other | Admitting: Family Medicine

## 2018-10-22 ENCOUNTER — Other Ambulatory Visit: Payer: Self-pay

## 2018-10-22 DIAGNOSIS — M5442 Lumbago with sciatica, left side: Secondary | ICD-10-CM

## 2018-10-22 DIAGNOSIS — G8929 Other chronic pain: Secondary | ICD-10-CM | POA: Diagnosis not present

## 2018-10-22 DIAGNOSIS — M542 Cervicalgia: Secondary | ICD-10-CM | POA: Diagnosis not present

## 2018-10-22 DIAGNOSIS — M5441 Lumbago with sciatica, right side: Secondary | ICD-10-CM

## 2018-10-22 MED ORDER — TRAMADOL HCL ER 100 MG PO TB24: 100.0000 mg | ORAL_TABLET | Freq: Every day | ORAL | 2 refills | Status: DC

## 2018-10-22 MED ORDER — METHOCARBAMOL 750 MG PO TABS: 750.0000 mg | ORAL_TABLET | Freq: Four times a day (QID) | ORAL | 1 refills | Status: DC | PRN

## 2018-10-22 MED ORDER — GABAPENTIN 400 MG PO CAPS: 800.0000 mg | ORAL_CAPSULE | Freq: Two times a day (BID) | ORAL | 3 refills | Status: DC

## 2018-10-22 MED ORDER — IBUPROFEN 600 MG PO TABS: ORAL_TABLET | ORAL | 2 refills | Status: DC

## 2018-10-22 NOTE — Progress Notes (Signed)
Telephone visit  Subjective: CC: f/u pain PCP: [name], DO FKC:LEXNTZG Jamie Mcgee is a 58 y.o. female calls for telephone consult today. Patient provides verbal consent for consult held via phone.  Location of patient: home Location of provider: [facility] Others present for call: none  1. Chronic pain Patient reports that pain has been better controlled with Tramadol ER 100mg .  She has been pleasantly surprised that it seems to be working so well.  She is a little bit frustrated with the fact that she is going to lose her Medicaid and therefore she is no longer going to be able to afford this formulation and will likely have to go to the twice daily dosing again.  She notes that pain is currently 2-3 out of 10 with medication and is a 10-10 without medication.  Pain is typically located in the neck and back.  She also takes ibuprofen twice daily, methocarbamol twice daily and gabapentin.  Denies any excessive sedation, falls, dizziness, confusion, respiratory depression.  She has a bowel regimen which she maintains 3 times per week.    ROS: Per HPI  Allergies  Allergen Reactions  . Lipitor [Atorvastatin] Other (See Comments)    Muscle aches and nausea  . Naproxen Nausea And Vomiting    Stomach pain   Past Medical History:  Diagnosis Date  . Carpal tunnel syndrome on right   . Cervical stenosis of spine    C5-6  . 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 Depression   . Fibromuscular dysplasia of renal artery ([place]) 2006  . GERD (gastroesophageal reflux disease)   . Hepatitis    type A in 1977  . HNP (herniated nucleus pulposus), cervical   . HTN (hypertension)   . Neuropathy     Current Outpatient Medications:  .  carvedilol (COREG) 6.25 MG tablet, TAKE 1 TABLET BY MOUTH TWICE DAILY WITH A MEAL, Disp: 180 tablet, Rfl: 2 .  citalopram (CELEXA) 40 MG tablet, Take 1 tablet (40 mg total) by mouth daily., Disp: 30 tablet, Rfl: 5 .  gabapentin  (NEURONTIN) 400 MG capsule, Take 2 capsules (800 mg total) by mouth 2 (two) times daily., Disp: 120 capsule, Rfl: 3 .  hydrochlorothiazide (HYDRODIURIL) 25 MG tablet, Take 1 tablet (25 mg total) by mouth daily., Disp: 30 tablet, Rfl: 5 .  ibuprofen (ADVIL) 600 MG tablet, TAKE 1 TABLET BY MOUTH EVERY 8 HOURS AS NEEDED FOR  MODERATE  PAIN, Disp: 90 tablet, Rfl: 0 .  losartan (COZAAR) 100 MG tablet, Take 1 tablet (100 mg total) by mouth daily., Disp: 90 tablet, Rfl: 2 .  methocarbamol (ROBAXIN) 750 MG tablet, Take 1 tablet (750 mg total) by mouth every 6 (six) hours as needed for muscle spasms., Disp: 90 tablet, Rfl: 0 .  nitroGLYCERIN (NITROSTAT) 0.4 MG SL tablet, Place 1 tablet (0.4 mg total) under the tongue every 5 (five) minutes as needed., Disp: 25 tablet, Rfl: 3 .  rosuvastatin (CRESTOR) 10 MG tablet, Take 1 tablet (10 mg total) by mouth daily., Disp: 30 tablet, Rfl: 5 .  traMADol (ULTRAM-ER) 100 MG 24 hr tablet, Take 1 tablet (100 mg total) by mouth daily., Disp: 30 tablet, Rfl: 0  Assessment/ Plan: 58 y.o. female   1. Chronic bilateral low back pain with bilateral sciatica Controlled with tramadol ER 100 mg tablets.  She in fact is finding that this is more helpful.  We discussed that I be happy to continue this medication if  it continues to be helpful but she may not be able to afford it if she loses Medicaid, which she anticipates.  She will follow-up with me in July and appointment has been scheduled.  Patient aware of appointment date and time - traMADol (ULTRAM-ER) 100 MG 24 hr tablet; Take 1 tablet (100 mg total) by mouth daily.  Dispense: 30 tablet; Refill: 2  2. Neck pain Refills of Neurontin ibuprofen and muscle relaxer sent. - ibuprofen (ADVIL) 600 MG tablet; TAKE 1 TABLET BY MOUTH EVERY 8 HOURS AS NEEDED FOR  MODERATE  PAIN  Dispense: 90 tablet; Refill: 2 - gabapentin (NEURONTIN) 400 MG capsule; Take 2 capsules (800 mg total) by mouth 2 (two) times daily.  Dispense: 120 capsule;  Refill: 3  The Narcotic Database has been reviewed.  There were no red flags.    Start time: 9:58am End time: 10:07am  Total time spent on patient care (including telephone call/ virtual visit): 19 minutes  Jamie Mcgee, [place] [phone]

## 2018-10-23 ENCOUNTER — Encounter: Payer: Self-pay | Admitting: Family Medicine

## 2018-10-29 ENCOUNTER — Other Ambulatory Visit (HOSPITAL_COMMUNITY): Payer: Self-pay | Admitting: Family Medicine

## 2018-10-29 DIAGNOSIS — Z1231 Encounter for screening mammogram for malignant neoplasm of breast: Secondary | ICD-10-CM

## 2018-11-08 ENCOUNTER — Other Ambulatory Visit: Payer: Self-pay

## 2018-11-08 ENCOUNTER — Ambulatory Visit (HOSPITAL_COMMUNITY)
Admission: RE | Admit: 2018-11-08 | Discharge: 2018-11-08 | Disposition: A | Discharge status: Home / Self Care | Payer: Medicaid Other | Source: Ambulatory Visit | Attending: Family Medicine | Admitting: Family Medicine

## 2018-11-08 DIAGNOSIS — Z1231 Encounter for screening mammogram for malignant neoplasm of breast: Secondary | ICD-10-CM | POA: Diagnosis present

## 2018-12-05 ENCOUNTER — Other Ambulatory Visit: Payer: Self-pay | Admitting: Family Medicine

## 2018-12-05 ENCOUNTER — Other Ambulatory Visit: Payer: Self-pay

## 2018-12-05 ENCOUNTER — Other Ambulatory Visit (INDEPENDENT_AMBULATORY_CARE_PROVIDER_SITE_OTHER): Payer: Medicaid Other

## 2018-12-05 DIAGNOSIS — M8580 Other specified disorders of bone density and structure, unspecified site: Secondary | ICD-10-CM

## 2018-12-05 DIAGNOSIS — M8588 Other specified disorders of bone density and structure, other site: Secondary | ICD-10-CM

## 2018-12-12 ENCOUNTER — Encounter: Payer: Self-pay | Admitting: Family Medicine

## 2018-12-16 ENCOUNTER — Other Ambulatory Visit: Payer: Self-pay

## 2018-12-16 ENCOUNTER — Ambulatory Visit (INDEPENDENT_AMBULATORY_CARE_PROVIDER_SITE_OTHER): Payer: Medicaid Other | Admitting: Family Medicine

## 2018-12-16 DIAGNOSIS — K0889 Other specified disorders of teeth and supporting structures: Secondary | ICD-10-CM | POA: Diagnosis not present

## 2018-12-16 MED ORDER — AMOXICILLIN-POT CLAVULANATE 875-125 MG PO TABS: 1.0000 | ORAL_TABLET | Freq: Two times a day (BID) | ORAL | 0 refills | Status: DC

## 2018-12-16 NOTE — Patient Instructions (Signed)
Dental Pain Dental pain may be caused by many things, including:  Tooth decay (cavities or caries).  Infection.  The inner part of the tooth being filled with pus (abscess).  Injury. Sometimes the cause of pain is unknown. Your pain can vary. It may be mild or severe. You may have it all the time, or it may occur only when you are:  Chewing.  Exposed to hot or cold temperature.  Eating or drinking sugary foods or beverages, such as soda or candy. Follow these instructions at home: Medicines  Take over-the-counter and prescription medicines only as told by your doctor.  If you were prescribed an antibiotic medicine, take it as told by your doctor. Do not stop taking the medicine even if you start to feel better. Eating and drinking  Do not eat foods or drinks that cause you pain. These include: ? Very hot or very cold foods or drinks. ? Sweet or sugary foods or drinks. Managing pain and swelling   Gargle with a salt-water mixture 3-4 times a day. To make this, dissolve -1 tsp of salt in 1 cup of warm water.  If told, put ice on the painful area of your face: ? Put ice in a plastic bag. ? Place a towel between your skin and the bag. ? Leave the ice on for 20 minutes, 2-3 times a day. Brushing your teeth  Brush your teeth twice a day using a fluoride toothpaste.  Floss your teeth once a day.  Use a toothpaste made for sensitive teeth as told by your doctor.  Use a soft toothbrush. General instructions  Do not apply heat to the outside of your face.  Watch your dental pain. Let your doctor know if there are any changes.  Keep all follow-up visits as told by your doctor. This is important. Contact a doctor if:  Your pain is not relieved by medicines.  You have new symptoms.  Your symptoms get worse. Get help right away if:  You cannot open your mouth.  You are having trouble breathing or swallowing.  You have a fever.  Your face, neck, or jaw is  swollen. Summary  Dental pain may be caused by many things, including tooth decay, injury, or infection. In some cases, the cause is not known.  Your pain may be mild or severe. You may have pain all the time, or you may have it only when you eat or drink.  Take over-the-counter and prescription medicines only as told by your doctor.  Watch your dental pain for any changes. Let your doctor know if symptoms get worse. This information is not intended to replace advice given to you by your health care provider. Make sure you discuss any questions you have with your health care provider. Document Released: 11/08/2007 Document Revised: 09/17/2018 Document Reviewed: 06/04/2017 Elsevier Patient Education  2020 [employer] Inc[employer]  

## 2018-12-16 NOTE — Progress Notes (Signed)
Telephone visit  Subjective: CC: tooth pain PCP: Jamie Norlander, DO JOA:CZYSAYT Jamie Mcgee is a 58 y.o. female calls for telephone consult today. Patient provides verbal consent for consult held via phone.  Location of patient: car Location of provider: [facility] Others present for call: none  1. Tooth pain Patient reports 2-week history of right upper tooth pain.  She notes that she has underlying gum disease and this tooth has been quite loose.  She has been wiggling the tooth in efforts to extracted.  She notes associated gum tenderness and that the adjacent tooth is also painful.  Denies any facial swelling but does feel that the gums look slightly swollen.  Denies any substantial discharge.  No difficulty opening or closing the mouth.  She is tolerating p.o. intake without difficulty.  No fevers.  She is currently on the on-call list for dental.   ROS: Per HPI  Allergies  Allergen Reactions  . Lipitor [Atorvastatin] Other (See Comments)    Muscle aches and nausea  . Naproxen Nausea And Vomiting    Stomach pain   Past Medical History:  Diagnosis Date  . Carpal tunnel syndrome on right   . Cervical stenosis of spine    C5-6  . 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 Depression   . Fibromuscular dysplasia of renal artery ([place]) 2006  . GERD (gastroesophageal reflux disease)   . Hepatitis    type A in 1977  . HNP (herniated nucleus pulposus), cervical   . HTN (hypertension)   . Neuropathy     Current Outpatient Medications:  .  carvedilol (COREG) 6.25 MG tablet, TAKE 1 TABLET BY MOUTH TWICE DAILY WITH A MEAL, Disp: 180 tablet, Rfl: 2 .  citalopram (CELEXA) 40 MG tablet, Take 1 tablet (40 mg total) by mouth daily., Disp: 30 tablet, Rfl: 5 .  gabapentin (NEURONTIN) 400 MG capsule, Take 2 capsules (800 mg total) by mouth 2 (two) times daily., Disp: 120 capsule, Rfl: 3 .  hydrochlorothiazide (HYDRODIURIL) 25 MG tablet, Take 1 tablet (25 mg total) by  mouth daily., Disp: 30 tablet, Rfl: 5 .  ibuprofen (ADVIL) 600 MG tablet, TAKE 1 TABLET BY MOUTH EVERY 8 HOURS AS NEEDED FOR  MODERATE  PAIN, Disp: 90 tablet, Rfl: 2 .  losartan (COZAAR) 100 MG tablet, Take 1 tablet (100 mg total) by mouth daily., Disp: 90 tablet, Rfl: 2 .  methocarbamol (ROBAXIN) 750 MG tablet, Take 1 tablet (750 mg total) by mouth every 6 (six) hours as needed for muscle spasms., Disp: 90 tablet, Rfl: 1 .  nitroGLYCERIN (NITROSTAT) 0.4 MG SL tablet, Place 1 tablet (0.4 mg total) under the tongue every 5 (five) minutes as needed., Disp: 25 tablet, Rfl: 3 .  rosuvastatin (CRESTOR) 10 MG tablet, Take 1 tablet (10 mg total) by mouth daily., Disp: 30 tablet, Rfl: 5 .  traMADol (ULTRAM-ER) 100 MG 24 hr tablet, Take 1 tablet (100 mg total) by mouth daily., Disp: 30 tablet, Rfl: 2 Gen: does not sound distressed Neuro: speech normal  Assessment/ Plan: 58 y.o. female   1. Pain, dental Will empirically treat with Augmentin 875 p.o. twice daily to cover for possible dental infection while she awaits visit with dentistry.  We discussed red flag signs and symptoms warranting further evaluation.  She was good understanding will follow-up PRN.   Start time: 10:14am End time: 10:17am  Total time spent on patient care (including telephone call/ virtual visit): 10 minutes  Jamie Mcgee M  Jamie Mcgee, [place] [phone]

## 2018-12-18 ENCOUNTER — Ambulatory Visit: Payer: Self-pay | Admitting: Family Medicine

## 2018-12-20 ENCOUNTER — Other Ambulatory Visit: Payer: Self-pay

## 2018-12-23 ENCOUNTER — Other Ambulatory Visit: Payer: Self-pay

## 2018-12-23 ENCOUNTER — Encounter: Payer: Self-pay | Admitting: Family Medicine

## 2018-12-23 ENCOUNTER — Ambulatory Visit (INDEPENDENT_AMBULATORY_CARE_PROVIDER_SITE_OTHER): Payer: Medicaid Other | Admitting: Family Medicine

## 2018-12-23 VITALS — BP 107/75 | HR 70 | Temp 99.2°F | Ht 64.0 in | Wt 194.0 lb

## 2018-12-23 DIAGNOSIS — E782 Mixed hyperlipidemia: Secondary | ICD-10-CM

## 2018-12-23 DIAGNOSIS — I10 Essential (primary) hypertension: Secondary | ICD-10-CM

## 2018-12-23 DIAGNOSIS — M542 Cervicalgia: Secondary | ICD-10-CM | POA: Diagnosis not present

## 2018-12-23 DIAGNOSIS — M25512 Pain in left shoulder: Secondary | ICD-10-CM

## 2018-12-23 DIAGNOSIS — M5442 Lumbago with sciatica, left side: Secondary | ICD-10-CM

## 2018-12-23 DIAGNOSIS — M5441 Lumbago with sciatica, right side: Secondary | ICD-10-CM

## 2018-12-23 DIAGNOSIS — G8929 Other chronic pain: Secondary | ICD-10-CM

## 2018-12-23 MED ORDER — METHOCARBAMOL 750 MG PO TABS: 750.0000 mg | ORAL_TABLET | Freq: Three times a day (TID) | ORAL | 5 refills | Status: DC | PRN

## 2018-12-23 MED ORDER — HYDROCHLOROTHIAZIDE 25 MG PO TABS: 25.0000 mg | ORAL_TABLET | Freq: Every day | ORAL | 5 refills | Status: DC

## 2018-12-23 MED ORDER — DICLOFENAC SODIUM 1 % TD GEL: 4.0000 g | Freq: Four times a day (QID) | TRANSDERMAL | 3 refills | Status: DC

## 2018-12-23 MED ORDER — GABAPENTIN 400 MG PO CAPS: 800.0000 mg | ORAL_CAPSULE | Freq: Two times a day (BID) | ORAL | 5 refills | Status: DC

## 2018-12-23 MED ORDER — TRAMADOL HCL ER 100 MG PO TB24: 100.0000 mg | ORAL_TABLET | Freq: Every day | ORAL | 5 refills | Status: DC

## 2018-12-23 NOTE — Patient Instructions (Signed)

## 2018-12-23 NOTE — Progress Notes (Signed)
Subjective: CC: f/u chronic pain, HLD PCP: [name], DO BHA:[id number] Jamie Mcgee is a 58 y.o. female presenting to clinic today for:  1. HLD Reports compliance with Crestor.  Denies any myalgia associated with medication.  She is tolerating it without difficulty.  2. Chronic pain Patient with chronic low back pain that is well controlled with tramadol ER 100 mg daily and Robaxin twice daily.  She also takes Neurontin 800 mg twice daily for associated neuropathy.  Denies excessive sedation unless she forgets to eat with the medicine.  She denies any falls, confusion or difficulty with breathing.  She has had no problems with constipation over the last several weeks because she is been on antibiotics for her teeth.  She has a dental procedure scheduled for August 3.  She does report uncontrolled left shoulder pain that is particularly aggravated when rolling over onto it during sleep.  She has been applying Biofreeze to the affected area with various degrees of improvement.  3.  Hypertension Patient reports compliance with Cozaar 100 mg and hydrochlorothiazide 25 mg daily.  No chest pain, shortness of breath, dizziness or lower extremity edema.  She does need refills on hydrochlorothiazide  ROS: Per HPI  Allergies  Allergen Reactions  . Lipitor [Atorvastatin] Other (See Comments)    Muscle aches and nausea  . Naproxen Nausea And Vomiting    Stomach pain   Past Medical History:  Diagnosis Date  . Carpal tunnel syndrome on right   . Cervical stenosis of spine    C5-6  . 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 Depression   . Fibromuscular dysplasia of renal artery ([place]) 2006  . GERD (gastroesophageal reflux disease)   . Hepatitis    type A in 1977  . HNP (herniated nucleus pulposus), cervical   . HTN (hypertension)   . Neuropathy     Current Outpatient Medications:  .  amoxicillin-clavulanate (AUGMENTIN) 875-125 MG tablet, Take 1 tablet by  mouth 2 (two) times daily., Disp: 20 tablet, Rfl: 0 .  carvedilol (COREG) 6.25 MG tablet, TAKE 1 TABLET BY MOUTH TWICE DAILY WITH A MEAL, Disp: 180 tablet, Rfl: 2 .  citalopram (CELEXA) 40 MG tablet, Take 1 tablet (40 mg total) by mouth daily., Disp: 30 tablet, Rfl: 5 .  gabapentin (NEURONTIN) 400 MG capsule, Take 2 capsules (800 mg total) by mouth 2 (two) times daily., Disp: 120 capsule, Rfl: 3 .  hydrochlorothiazide (HYDRODIURIL) 25 MG tablet, Take 1 tablet (25 mg total) by mouth daily., Disp: 30 tablet, Rfl: 5 .  ibuprofen (ADVIL) 600 MG tablet, TAKE 1 TABLET BY MOUTH EVERY 8 HOURS AS NEEDED FOR  MODERATE  PAIN, Disp: 90 tablet, Rfl: 2 .  losartan (COZAAR) 100 MG tablet, Take 1 tablet (100 mg total) by mouth daily., Disp: 90 tablet, Rfl: 2 .  methocarbamol (ROBAXIN) 750 MG tablet, Take 1 tablet (750 mg total) by mouth every 6 (six) hours as needed for muscle spasms., Disp: 90 tablet, Rfl: 1 .  nitroGLYCERIN (NITROSTAT) 0.4 MG SL tablet, Place 1 tablet (0.4 mg total) under the tongue every 5 (five) minutes as needed., Disp: 25 tablet, Rfl: 3 .  rosuvastatin (CRESTOR) 10 MG tablet, Take 1 tablet (10 mg total) by mouth daily., Disp: 30 tablet, Rfl: 5 .  traMADol (ULTRAM-ER) 100 MG 24 hr tablet, Take 1 tablet (100 mg total) by mouth daily., Disp: 30 tablet, Rfl: 2 Social History   Socioeconomic History  . Marital  status: Single    Spouse name: Not on file  . Number of children: Not on file  . Years of education: Not on file  . Highest education level: Not on file  Occupational History  . Not on file  Social Needs  . Financial resource strain: Not on file  . Food insecurity    Worry: Not on file    Inability: Not on file  . Transportation needs    Medical: Not on file    Non-medical: Not on file  Tobacco Use  . Smoking status: Current Every Day Smoker    Packs/day: 1.00    Years: 40.00    Pack years: 40.00    Types: Cigarettes  . Smokeless tobacco: Never Used  . Tobacco comment: pt  quit smoking 2016 for 7 months but restarted last year  Substance and Sexual Activity  . Alcohol use: No    Alcohol/week: 0.0 standard drinks  . Drug use: Yes    Frequency: 0.5 times per week    Types: Marijuana    Comment: weekly  . Sexual activity: Yes    Birth control/protection: None, Post-menopausal  Lifestyle  . Physical activity    Days per week: Not on file    Minutes per session: Not on file  . Stress: Not on file  Relationships  . Social Herbalist on phone: Not on file    Gets together: Not on file    Attends religious service: Not on file    Active member of club or organization: Not on file    Attends meetings of clubs or organizations: Not on file    Relationship status: Not on file  . Intimate partner violence    Fear of current or ex partner: Not on file    Emotionally abused: Not on file    Physically abused: Not on file    Forced sexual activity: Not on file  Other Topics Concern  . Not on file  Social History Narrative   The patient does not have any sex drive and has not had one for almost a year.   Her   husband thinks she does not love him anymore.  He does not seem to understand   her   depression.   Family History  Problem Relation Age of Onset  . Coronary artery disease Father        States congenital abnormality  . Diabetes Father   . Hyperlipidemia Father   . CAD Sister        Stents   . Cancer Sister        breast  . Cancer Mother        breast  . Cancer Maternal Aunt        breast  . Cancer Maternal Grandmother        breast  . Anesthesia problems Neg Hx   . Hypotension Neg Hx   . Malignant hyperthermia Neg Hx   . Pseudochol deficiency Neg Hx     Objective: Office vital signs reviewed. BP 107/75   Pulse 70   Temp 99.2 F (37.3 C) (Temporal)   Ht 5' 4"  (1.626 m)   Wt 194 lb (88 kg)   LMP 05/25/2011   BMI 33.30 kg/m   Physical Examination:  General: Awake, alert, well nourished, No acute distress HEENT:  Normal, sclera white, MMM Cardio: regular rate and rhythm, S1S2 heard, no murmurs appreciated Pulm: clear to auscultation bilaterally, no wheezes, rhonchi or rales; normal  work of breathing on room air Extremities: warm, well perfused, No edema, cyanosis or clubbing; +2 pulses bilaterally MSK: normal gait and station; she has full active range of motion of the left shoulder but does have painful arc noted.  Minimal pain noted with full can testing.  Negative empty can.  Strength intact bilaterally.  Assessment/ Plan: 58 y.o. female   1. Chronic bilateral low back pain with bilateral sciatica Controlled substance contract is in place and not due for renewal until March of next year.  We will update her UDS.  Tramadol was refilled x6.  The national card database was reviewed and there were no red flags.  Robaxin and Neurontin also refilled - ToxASSURE Select 13 (MW), Urine - traMADol (ULTRAM-ER) 100 MG 24 hr tablet; Take 1 tablet (100 mg total) by mouth daily.  Dispense: 30 tablet; Refill: 5 - methocarbamol (ROBAXIN) 750 MG tablet; Take 1 tablet (750 mg total) by mouth every 8 (eight) hours as needed for muscle spasms.  Dispense: 90 tablet; Refill: 5 - gabapentin (NEURONTIN) 400 MG capsule; Take 2 capsules (800 mg total) by mouth 2 (two) times daily.  Dispense: 120 capsule; Refill: 5  2. Mixed hyperlipidemia Check fasting lipid panel and repeat LFTs.  Continue statin - Lipid Panel - CMP14+EGFR  3. Neck pain As above - traMADol (ULTRAM-ER) 100 MG 24 hr tablet; Take 1 tablet (100 mg total) by mouth daily.  Dispense: 30 tablet; Refill: 5 - methocarbamol (ROBAXIN) 750 MG tablet; Take 1 tablet (750 mg total) by mouth every 8 (eight) hours as needed for muscle spasms.  Dispense: 90 tablet; Refill: 5 - gabapentin (NEURONTIN) 400 MG capsule; Take 2 capsules (800 mg total) by mouth 2 (two) times daily.  Dispense: 120 capsule; Refill: 5  4. Essential hypertension, benign Controlled.   Hydrochlorothiazide renewed - hydrochlorothiazide (HYDRODIURIL) 25 MG tablet; Take 1 tablet (25 mg total) by mouth daily.  Dispense: 30 tablet; Refill: 5  5. Chronic left shoulder pain Trial of Voltaren gel sent to pharmacy.  If persistent may need to see orthopedics for further evaluation. - diclofenac sodium (VOLTAREN) 1 % GEL; Apply 4 g topically 4 (four) times daily.  Dispense: 400 g; Refill: 3   Orders Placed This Encounter  Procedures  . Lipid Panel  . CMP14+EGFR   No orders of the defined types were placed in this encounter.    [name], DO [place] [phone]

## 2018-12-24 ENCOUNTER — Telehealth: Payer: Self-pay | Admitting: *Deleted

## 2018-12-24 LAB — CMP14+EGFR
ALT: 42 IU/L — ABNORMAL HIGH (ref 0–32)
AST: 62 IU/L — ABNORMAL HIGH (ref 0–40)
Albumin/Globulin Ratio: 1.4 (ref 1.2–2.2)
Albumin: 4.2 g/dL (ref 3.8–4.9)
Alkaline Phosphatase: 122 IU/L — ABNORMAL HIGH (ref 39–117)
BUN/Creatinine Ratio: 5 — ABNORMAL LOW (ref 9–23)
BUN: 4 mg/dL — ABNORMAL LOW (ref 6–24)
Bilirubin Total: 0.4 mg/dL (ref 0.0–1.2)
CO2: 26 mmol/L (ref 20–29)
Calcium: 9.2 mg/dL (ref 8.7–10.2)
Chloride: 97 mmol/L (ref 96–106)
Creatinine, Ser: 0.81 mg/dL (ref 0.57–1.00)
GFR calc Af Amer: 93 mL/min/{1.73_m2} (ref >59)
GFR calc non Af Amer: 80 mL/min/{1.73_m2} (ref >59)
Globulin, Total: 3.1 g/dL (ref 1.5–4.5)
Glucose: 101 mg/dL — ABNORMAL HIGH (ref 65–99)
Potassium: 3.8 mmol/L (ref 3.5–5.2)
Sodium: 140 mmol/L (ref 134–144)
Total Protein: 7.3 g/dL (ref 6.0–8.5)

## 2018-12-24 LAB — LIPID PANEL
Chol/HDL Ratio: 2.6 ratio (ref 0.0–4.4)
Cholesterol, Total: 96 mg/dL — ABNORMAL LOW (ref 100–199)
HDL: 37 mg/dL — ABNORMAL LOW (ref >39)
LDL Calculated: 36 mg/dL (ref 0–99)
Triglycerides: 117 mg/dL (ref 0–149)
VLDL Cholesterol Cal: 23 mg/dL (ref 5–40)

## 2018-12-24 NOTE — Telephone Encounter (Signed)
She may obtain OTC. Please inform patient.

## 2018-12-24 NOTE — Telephone Encounter (Signed)
Pt aware will get otc

## 2018-12-24 NOTE — Telephone Encounter (Signed)
Insurance Denied Diclofenac Sodium 1% gel  Please send alternate or I can try for PA

## 2018-12-27 LAB — TOXASSURE SELECT 13 (MW), URINE

## 2019-02-05 ENCOUNTER — Other Ambulatory Visit: Payer: Self-pay | Admitting: Family Medicine

## 2019-02-05 DIAGNOSIS — M5442 Lumbago with sciatica, left side: Secondary | ICD-10-CM

## 2019-02-05 DIAGNOSIS — M542 Cervicalgia: Secondary | ICD-10-CM

## 2019-02-05 DIAGNOSIS — G8929 Other chronic pain: Secondary | ICD-10-CM

## 2019-04-29 ENCOUNTER — Other Ambulatory Visit: Payer: Self-pay | Admitting: Family Medicine

## 2019-04-29 DIAGNOSIS — F331 Major depressive disorder, recurrent, moderate: Secondary | ICD-10-CM

## 2019-04-29 DIAGNOSIS — E7800 Pure hypercholesterolemia, unspecified: Secondary | ICD-10-CM

## 2019-06-10 ENCOUNTER — Encounter: Payer: Self-pay | Admitting: Family Medicine

## 2019-06-10 ENCOUNTER — Ambulatory Visit (INDEPENDENT_AMBULATORY_CARE_PROVIDER_SITE_OTHER): Payer: Medicaid Other | Admitting: Family Medicine

## 2019-06-10 VITALS — BP 129/88 | HR 81 | Wt 191.0 lb

## 2019-06-10 DIAGNOSIS — F331 Major depressive disorder, recurrent, moderate: Secondary | ICD-10-CM | POA: Diagnosis not present

## 2019-06-10 DIAGNOSIS — M5441 Lumbago with sciatica, right side: Secondary | ICD-10-CM

## 2019-06-10 DIAGNOSIS — F4323 Adjustment disorder with mixed anxiety and depressed mood: Secondary | ICD-10-CM | POA: Diagnosis not present

## 2019-06-10 DIAGNOSIS — G8929 Other chronic pain: Secondary | ICD-10-CM

## 2019-06-10 DIAGNOSIS — M5442 Lumbago with sciatica, left side: Secondary | ICD-10-CM | POA: Diagnosis not present

## 2019-06-10 DIAGNOSIS — E7800 Pure hypercholesterolemia, unspecified: Secondary | ICD-10-CM

## 2019-06-10 DIAGNOSIS — I10 Essential (primary) hypertension: Secondary | ICD-10-CM | POA: Diagnosis not present

## 2019-06-10 MED ORDER — HYDROCHLOROTHIAZIDE 25 MG PO TABS: 25.0000 mg | ORAL_TABLET | Freq: Every day | ORAL | 5 refills | Status: DC

## 2019-06-10 MED ORDER — GABAPENTIN 300 MG PO CAPS: 900.0000 mg | ORAL_CAPSULE | Freq: Two times a day (BID) | ORAL | 5 refills | Status: DC

## 2019-06-10 MED ORDER — NITROGLYCERIN 0.4 MG SL SUBL: 0.4000 mg | SUBLINGUAL_TABLET | SUBLINGUAL | 3 refills | Status: DC | PRN

## 2019-06-10 MED ORDER — LOSARTAN POTASSIUM 100 MG PO TABS: 100.0000 mg | ORAL_TABLET | Freq: Every day | ORAL | 5 refills | Status: DC

## 2019-06-10 MED ORDER — ROSUVASTATIN CALCIUM 5 MG PO TABS: 5.0000 mg | ORAL_TABLET | Freq: Every day | ORAL | 5 refills | Status: DC

## 2019-06-10 MED ORDER — BUSPIRONE HCL 5 MG PO TABS: ORAL_TABLET | ORAL | 0 refills | Status: AC

## 2019-06-10 MED ORDER — CITALOPRAM HYDROBROMIDE 40 MG PO TABS: 40.0000 mg | ORAL_TABLET | Freq: Every day | ORAL | 5 refills | Status: DC

## 2019-06-10 MED ORDER — CARVEDILOL 6.25 MG PO TABS: 6.2500 mg | ORAL_TABLET | Freq: Two times a day (BID) | ORAL | 1 refills | Status: DC

## 2019-06-10 NOTE — Progress Notes (Signed)
Telephone visit  Subjective: CC: f/u HTN, HLD PCP: [name], DO Jamie Mcgee is a 59 y.o. female calls for telephone consult today. Patient provides verbal consent for consult held via phone.  Due to COVID-19 pandemic this visit was conducted virtually. This visit type was conducted due to national recommendations for restrictions regarding the COVID-19 Pandemic (e.g. social distancing, sheltering in place) in an effort to limit this patient's exposure and mitigate transmission in our community. All issues noted in this document were discussed and addressed.  A physical exam was not performed with this format.   Location of patient: home Location of provider: Working remotely from home Others present for call: none  1.  Hypertension with hyperlipidemia Patient reports compliance with her medications.  She did reduce her Crestor to 5 mg daily due to impact on LFTs at last metabolic check.  She has not yet come in for her repeat LFTs.  She has no problems taking the 5 mg daily.  Blood pressure has been well controlled.  No chest pain or shortness of breath.  She continues to smoke daily.  2.  Chronic back pain and neuropathy Unfortunately, patient tested positive for THC on her last urine drug screen in July and therefore tramadol, which was working well for her, was discontinued per office policy.  She has not been able to continue the prescribed Motrin due to GI irritation.  She has been using Voltaren topically which does seem to help.  She does feel that her gabapentin likely needs to be increased due to worsening neuropathy.  She is currently taking 800 mg twice daily.  She does not feel confident she can take this 3 times daily but would be willing to go up on the dose twice daily.    3.  Anxiety and depression Patient reports worsening anxiety and depression.  She feels that this is likely impacted by isolation recommendations due to COVID-19.  She reports feeling like she  does not want to leave her home.  She has decreased motivation, concentration.  She has had difficulty sleeping and reports excessive daytime fatigue as a result.  She denies any suicidal or homicidal thoughts.  She has not yet reached out to counseling services.  She does not leave her home and therefore has not pursued this.  She is compliant with Celexa 40 mg daily.   ROS: Per HPI  Allergies  Allergen Reactions  . Lipitor [Atorvastatin] Other (See Comments)    Muscle aches and nausea  . Naproxen Nausea And Vomiting    Stomach pain   Past Medical History:  Diagnosis Date  . Carpal tunnel syndrome on right   . Cervical stenosis of spine    C5-6  . 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 Depression   . Fibromuscular dysplasia of renal artery ([place]) 2006  . GERD (gastroesophageal reflux disease)   . Hepatitis    type A in 1977  . HNP (herniated nucleus pulposus), cervical   . HTN (hypertension)   . Neuropathy     Current Outpatient Medications:  .  carvedilol (COREG) 6.25 MG tablet, Take 1 tablet (6.25 mg total) by mouth 2 (two) times daily with a meal., Disp: 180 tablet, Rfl: 1 .  citalopram (CELEXA) 40 MG tablet, Take 1 tablet (40 mg total) by mouth daily., Disp: 30 tablet, Rfl: 5 .  diclofenac sodium (VOLTAREN) 1 % GEL, Apply 4 g topically 4 (four) times daily., Disp: 400  g, Rfl: 3 .  gabapentin (NEURONTIN) 300 MG capsule, Take 3 capsules (900 mg total) by mouth 2 (two) times daily., Disp: 180 capsule, Rfl: 5 .  hydrochlorothiazide (HYDRODIURIL) 25 MG tablet, Take 1 tablet (25 mg total) by mouth daily., Disp: 30 tablet, Rfl: 5 .  losartan (COZAAR) 100 MG tablet, Take 1 tablet (100 mg total) by mouth daily., Disp: 30 tablet, Rfl: 5 .  methocarbamol (ROBAXIN) 750 MG tablet, Take 1 tablet (750 mg total) by mouth every 8 (eight) hours as needed for muscle spasms., Disp: 90 tablet, Rfl: 5 .  nitroGLYCERIN (NITROSTAT) 0.4 MG SL tablet, Place 1 tablet (0.4 mg  total) under the tongue every 5 (five) minutes as needed., Disp: 25 tablet, Rfl: 3 .  rosuvastatin (CRESTOR) 5 MG tablet, Take 1 tablet (5 mg total) by mouth at bedtime., Disp: 30 tablet, Rfl: 5  Blood pressure 129/88, pulse 81, weight 191 lb (86.6 kg), last menstrual period 05/25/2011.  Gen: Does not sound distressed Psych: Mood depressed.  Thought process linear.  Speech normal. GAD 7 : Generalized Anxiety Score 06/10/2019 08/20/2018 04/19/2018  Nervous, Anxious, on Edge 3 0 3  Control/stop worrying 3 0 3  Worry too much - different things 3 0 3  Trouble relaxing 3 2 3   Restless 3 2 3   Easily annoyed or irritable 0 2 3  Afraid - awful might happen 0 0 3  Total GAD 7 Score 15 6 21   Anxiety Difficulty Very difficult - Extremely difficult    Depression screen [facility] 2/9 06/10/2019 12/23/2018 08/20/2018  Decreased Interest 3 0 2  Down, Depressed, Hopeless 3 0 0  PHQ - 2 Score 6 0 2  Altered sleeping 1 0 0  Tired, decreased energy 3 0 1  Change in appetite 2 0 0  Feeling bad or failure about yourself  1 0 0  Trouble concentrating 3 0 2  Moving slowly or fidgety/restless 0 0 0  Suicidal thoughts 0 0 0  PHQ-9 Score 16 0 5  Difficult doing work/chores Very difficult - -  Some recent data might be hidden    Assessment/ Plan: 59 y.o. female   1. Situational mixed anxiety and depressive disorder Not well controlled.  Likely exacerbated by isolation and COVID-19.  She is on max dose of Celexa so we have added buspirone.  We discussed titration of this medication.  We will follow up on February 3 at 1115 for medication adjustment if needed.  We discussed that we could also consider adding something like Wellbutrin if depressive symptoms are not improving with the buspirone.  I have offered counseling but patient is somewhat reluctant to do this.  I will have the virtual behavioral health providers reach out to her.  I do worry about her isolation behaviors. - citalopram (CELEXA) 40 MG tablet; Take  1 tablet (40 mg total) by mouth daily.  Dispense: 30 tablet; Refill: 5 - busPIRone (BUSPAR) 5 MG tablet; Take 1 tablet (5 mg total) by mouth 2 (two) times daily for 7 days, THEN 1.5 tablets (7.5 mg total) 2 (two) times daily for 7 days, THEN 2 tablets (10 mg total) 2 (two) times daily for 14 days.  Dispense: 91 tablet; Refill: 0  2. Moderate episode of recurrent major depressive disorder (HCC) - citalopram (CELEXA) 40 MG tablet; Take 1 tablet (40 mg total) by mouth daily.  Dispense: 30 tablet; Refill: 5  3. Chronic bilateral low back pain with bilateral sciatica Not well controlled and therefore will increase  to 900 mg of Neurontin twice daily.  We discussed 3 times daily dosing but patient does not feel confident that she can remember to take this 3 times daily.  Unfortunately, she tested positive for THC on her last urine drug screen and therefore we did not continue tramadol which did work well for her.  NSAIDs have been bothersome to her GI tract and therefore that was discontinued.  She will continue Voltaren to other arthritic joints topically. - gabapentin (NEURONTIN) 300 MG capsule; Take 3 capsules (900 mg total) by mouth 2 (two) times daily.  Dispense: 180 capsule; Refill: 5  4. Essential hypertension, benign Well-controlled.  Continue current regimen. - hydrochlorothiazide (HYDRODIURIL) 25 MG tablet; Take 1 tablet (25 mg total) by mouth daily.  Dispense: 30 tablet; Refill: 5 - carvedilol (COREG) 6.25 MG tablet; Take 1 tablet (6.25 mg total) by mouth 2 (two) times daily with a meal.  Dispense: 180 tablet; Refill: 1 - losartan (COZAAR) 100 MG tablet; Take 1 tablet (100 mg total) by mouth daily.  Dispense: 30 tablet; Refill: 5  6. Pure hypercholesterolemia We reduced her lipid medication to 5 mg given impact on LFTs.  I do want to have these LFTs repeated. - rosuvastatin (CRESTOR) 5 MG tablet; Take 1 tablet (5 mg total) by mouth at bedtime.  Dispense: 30 tablet; Refill: 5 - CMP,  future  Start time: 11:15am End time: 11:37am  Total time spent on patient care (including telephone call/ virtual visit): 27 minutes  [place], [place] [phone]

## 2019-06-12 ENCOUNTER — Telehealth (INDEPENDENT_AMBULATORY_CARE_PROVIDER_SITE_OTHER): Payer: Medicaid Other | Admitting: Licensed Clinical Social Worker

## 2019-06-12 DIAGNOSIS — F4323 Adjustment disorder with mixed anxiety and depressed mood: Secondary | ICD-10-CM

## 2019-06-12 NOTE — Telephone Encounter (Signed)
Writer attempted to make initial [place] call.  Writer called [phone] at 1030am on 06/12/2019; unable to leave vmail; phone rang more than 10 times.

## 2019-07-09 ENCOUNTER — Ambulatory Visit (INDEPENDENT_AMBULATORY_CARE_PROVIDER_SITE_OTHER): Payer: Medicaid Other | Admitting: Family Medicine

## 2019-07-09 DIAGNOSIS — M5441 Lumbago with sciatica, right side: Secondary | ICD-10-CM

## 2019-07-09 DIAGNOSIS — M5442 Lumbago with sciatica, left side: Secondary | ICD-10-CM

## 2019-07-09 DIAGNOSIS — G8929 Other chronic pain: Secondary | ICD-10-CM | POA: Diagnosis not present

## 2019-07-09 DIAGNOSIS — F4323 Adjustment disorder with mixed anxiety and depressed mood: Secondary | ICD-10-CM | POA: Diagnosis not present

## 2019-07-09 MED ORDER — BUSPIRONE HCL 10 MG PO TABS: 10.0000 mg | ORAL_TABLET | Freq: Three times a day (TID) | ORAL | 1 refills | Status: DC

## 2019-07-09 MED ORDER — HYDROXYZINE HCL 50 MG PO TABS: 25.0000 mg | ORAL_TABLET | Freq: Three times a day (TID) | ORAL | 0 refills | Status: DC | PRN

## 2019-07-09 NOTE — Progress Notes (Signed)
Telephone visit  Subjective: CC: f/u GAD/ Depression, chronic and neuropathy PCP: [name], DO [id number] Jamie Mcgee is a 59 y.o. female calls for telephone consult today. Patient provides verbal consent for consult held via phone.  Due to COVID-19 pandemic this visit was conducted virtually. This visit type was conducted due to national recommendations for restrictions regarding the COVID-19 Pandemic (e.g. social distancing, sheltering in place) in an effort to limit this patient's exposure and mitigate transmission in our community. All issues noted in this document were discussed and addressed.  A physical exam was not performed with this format.   Location of patient: home Location of provider: Working remotely from home Others present for call: none  1. GAD/ Depression Anxiety is somewhat better.  She continues to have some situational anxiety.  She had a panic attack at the grocery store yesterday.  She notes that she continues to have situational anxiety and reluctance to leave her home.  Her friend gave her a Klonopin the other day and she did find that this seemed to relieve her anxiety symptoms and allow her to sleep restfully.  She has had some improvement with the BuSpar 10 mg twice daily.  She continues to take her Celexa as directed.  2.  Chronic low back pain with sciatica Patient with ongoing low back pain with bilateral sciatica.  The increased dose of gabapentin to 900 mg did help some but not much.  She is also taking Robaxin as needed.  She does think that she would like to go ahead and see an orthopedist for this issue.  She previously was seen by Dr. Patrice Paradise in 2019 but would like to actually go back to Dr. Lorin Mercy, who performed her C-spine surgery.  Does not report any falls, fecal incontinence or urinary retention   ROS: Per HPI  Allergies  Allergen Reactions  . Lipitor [Atorvastatin] Other (See Comments)    Muscle aches and nausea  . Naproxen Nausea And  Vomiting    Stomach pain   Past Medical History:  Diagnosis Date  . Carpal tunnel syndrome on right   . Cervical stenosis of spine    C5-6  . 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 Depression   . Fibromuscular dysplasia of renal artery ([place]) 2006  . GERD (gastroesophageal reflux disease)   . Hepatitis    type A in 1977  . HNP (herniated nucleus pulposus), cervical   . HTN (hypertension)   . Neuropathy     Current Outpatient Medications:  .  carvedilol (COREG) 6.25 MG tablet, Take 1 tablet (6.25 mg total) by mouth 2 (two) times daily with a meal., Disp: 180 tablet, Rfl: 1 .  citalopram (CELEXA) 40 MG tablet, Take 1 tablet (40 mg total) by mouth daily., Disp: 30 tablet, Rfl: 5 .  diclofenac sodium (VOLTAREN) 1 % GEL, Apply 4 g topically 4 (four) times daily., Disp: 400 g, Rfl: 3 .  gabapentin (NEURONTIN) 300 MG capsule, Take 3 capsules (900 mg total) by mouth 2 (two) times daily., Disp: 180 capsule, Rfl: 5 .  hydrochlorothiazide (HYDRODIURIL) 25 MG tablet, Take 1 tablet (25 mg total) by mouth daily., Disp: 30 tablet, Rfl: 5 .  losartan (COZAAR) 100 MG tablet, Take 1 tablet (100 mg total) by mouth daily., Disp: 30 tablet, Rfl: 5 .  methocarbamol (ROBAXIN) 750 MG tablet, Take 1 tablet (750 mg total) by mouth every 8 (eight) hours as needed for muscle spasms., Disp: 90 tablet,  Rfl: 5 .  nitroGLYCERIN (NITROSTAT) 0.4 MG SL tablet, Place 1 tablet (0.4 mg total) under the tongue every 5 (five) minutes as needed., Disp: 25 tablet, Rfl: 3 .  rosuvastatin (CRESTOR) 5 MG tablet, Take 1 tablet (5 mg total) by mouth at bedtime., Disp: 30 tablet, Rfl: 5  Assessment/ Plan: 59 y.o. female   1. Situational mixed anxiety and depressive disorder Improving but still not under good control.  We will increase her frequency of dosing to 10 mg 3 times daily.  She is to call me in the next 2 to 4 weeks to let me know if this dose is working well for her, otherwise we will plan to  increase to 15 mg 3 times daily.  She is to continue her SSRI.  Have also added Atarax up to 3 times daily as needed panic attack.  I discussed that benzodiazepines would not be an option for her unfortunately because of history of marijuana use.  She voiced good understanding of this.  We discussed the sedative side effects of Atarax and the possibility for dry mouth.  I have encouraged her to use only half a tablet to start with and then can increase to 1 full tablet if needed - busPIRone (BUSPAR) 10 MG tablet; Take 1 tablet (10 mg total) by mouth 3 (three) times daily.  Dispense: 90 tablet; Refill: 1 - hydrOXYzine (ATARAX/VISTARIL) 50 MG tablet; Take 0.5-1 tablets (25-50 mg total) by mouth 3 (three) times daily as needed for anxiety (sleep).  Dispense: 90 tablet; Refill: 0  2. Chronic bilateral low back pain with bilateral sciatica Refractory to use of muscle relaxer, gabapentin.  Tramadol did previously help quite a bit but unfortunately she failed a drug screen and this was discontinued per office policy.  Previously seen by orthopedics for her neck.  She wishes to return to Dr. Lorin Mercy as this is who did her C-spine fusion.  Referral has been placed. - Ambulatory referral to Orthopedic Surgery   Start time: 11:15am End time: 11:31am  Total time spent on patient care (including telephone call/ virtual visit): 21 minutes  [place], [place] [phone]

## 2019-07-16 ENCOUNTER — Telehealth (INDEPENDENT_AMBULATORY_CARE_PROVIDER_SITE_OTHER): Payer: Medicaid Other | Admitting: Licensed Clinical Social Worker

## 2019-07-16 DIAGNOSIS — F4323 Adjustment disorder with mixed anxiety and depressed mood: Secondary | ICD-10-CM

## 2019-07-16 NOTE — BH Specialist Note (Signed)
[facility] Initial Clinical Assessment  MRN: [id number] NAME: ANSLIE WECKER Date: 07/16/19  Start time:   930 am  End time:  10am Total time:  30 min Call number:  [phone]  Type of Contact: Type of Contact: Phone Call Initial Contact Patient consent obtained: Patient consent obtained for Virtual Visit: Yes Reason for Visit today: Reason for Your Call/Visit Today: establish care  Treatment History Patient recently received Inpatient Treatment: Have You Recently Been in Any Inpatient Treatment (Hospital/Detox/Crisis Center/28-Day Program)?: No  Facility/Program:    Date of discharge:   Patient currently being seen by therapist/psychiatrist: Do You Currently Have a Therapist/Psychiatrist?: No Patient currently receiving the following services:    Past Psychiatric History/Hospitalization(s): Anxiety: Yes (medication management) Bipolar Disorder: No Depression: No Mania: No Psychosis: No Schizophrenia: No Personality Disorder: No Hospitalization for psychiatric illness: No History of Electroconvulsive Shock Therapy: No Prior Suicide Attempts: No  Clinical Assessment:  PHQ-9 Assessments: Depression screen [facility] 2/9 06/10/2019 12/23/2018 08/20/2018  Decreased Interest 3 0 2  Down, Depressed, Hopeless 3 0 0  PHQ - 2 Score 6 0 2  Altered sleeping 1 0 0  Tired, decreased energy 3 0 1  Change in appetite 2 0 0  Feeling bad or failure about yourself  1 0 0  Trouble concentrating 3 0 2  Moving slowly or fidgety/restless 0 0 0  Suicidal thoughts 0 0 0  PHQ-9 Score 16 0 5  Difficult doing work/chores Very difficult - -  Some recent data might be hidden    GAD-7 Assessments: GAD 7 : Generalized Anxiety Score 06/10/2019 08/20/2018 04/19/2018  Nervous, Anxious, on Edge 3 0 3  Control/stop worrying 3 0 3  Worry too much - different things 3 0 3  Trouble relaxing 3 2 3   Restless 3 2 3   Easily annoyed or irritable 0 2 3  Afraid - awful might happen 0 0 3  Total GAD 7  Score 15 6 21   Anxiety Difficulty Very difficult - Extremely difficult     Social Functioning Social maturity: Social Maturity: Isolates Social judgement:    Stress Current stressors: Current Stressors: Other (Comment)(CoVid 19) Familial stressors: Familial Stressors: None Sleep: Sleep: Decreased Appetite: Appetite: No problems Coping ability: Coping ability: Overwhelmed Patient taking medications as prescribed: Patient taking medications as prescribed: Yes  Current medications:  Outpatient Encounter Medications as of 07/16/2019  Medication Sig  . busPIRone (BUSPAR) 10 MG tablet Take 1 tablet (10 mg total) by mouth 3 (three) times daily.  . carvedilol (COREG) 6.25 MG tablet Take 1 tablet (6.25 mg total) by mouth 2 (two) times daily with a meal.  . citalopram (CELEXA) 40 MG tablet Take 1 tablet (40 mg total) by mouth daily.  . diclofenac sodium (VOLTAREN) 1 % GEL Apply 4 g topically 4 (four) times daily.  Marland Kitchen gabapentin (NEURONTIN) 300 MG capsule Take 3 capsules (900 mg total) by mouth 2 (two) times daily.  . hydrochlorothiazide (HYDRODIURIL) 25 MG tablet Take 1 tablet (25 mg total) by mouth daily.  . hydrOXYzine (ATARAX/VISTARIL) 50 MG tablet Take 0.5-1 tablets (25-50 mg total) by mouth 3 (three) times daily as needed for anxiety (sleep).  . losartan (COZAAR) 100 MG tablet Take 1 tablet (100 mg total) by mouth daily.  . methocarbamol (ROBAXIN) 750 MG tablet Take 1 tablet (750 mg total) by mouth every 8 (eight) hours as needed for muscle spasms.  . nitroGLYCERIN (NITROSTAT) 0.4 MG SL tablet Place 1 tablet (0.4 mg total) under  the tongue every 5 (five) minutes as needed.  . rosuvastatin (CRESTOR) 5 MG tablet Take 1 tablet (5 mg total) by mouth at bedtime.   No facility-administered encounter medications on file as of 07/16/2019.    Self-harm Behaviors Risk Assessment Self-harm risk factors:   Patient endorses recent thoughts of harming self: Have you recently had any thoughts about  harming yourself?: No  [name] Suicide Severity Rating Scale: No flowsheet data found.  Danger to Others Risk Assessment Danger to others risk factors:   Patient endorses recent thoughts of harming others: Notification required: No need or identified person  Dynamic Appraisal of Situational Aggression (DASA): No flowsheet data found.  Substance Use Assessment Patient recently consumed alcohol:    Alcohol Use Disorder Identification Test (AUDIT): No flowsheet data found. Patient recently used drugs:    Opioid Risk Assessment:  Patient is concerned about dependence or abuse of substances:    ASAM Multidimensional Assessment Summary:  Dimension 1:    Dimension 1 Rating:    Dimension 2:    Dimension 2 Rating:    Dimension 3:    Dimension 3 Rating:    Dimension 4:    Dimension 4 Rating:    Dimension 5:    Dimension 5 Rating:    Dimension 6:    Dimension 6 Rating:   ASAM's Severity Rating Score:   ASAM Recommended Level of Treatment:     Goals, Interventions and Follow-up Plan Goals: Increase healthy adjustment to current life circumstances and Ortha wants to be able to leave her home without having panic attacks Interventions: Mindfulness or Relaxation Training and Brief CBT Follow-up Plan: Weekly VBH sessions  Summary of Clinical Assessment Summary: Rya is a 59 year old woman that lives in [place], [place].  She lives alone and has 2 daughters.  Brexleigh reports bouts of anxiety and depression for the past 3-4 months.  She reports symptoms are CoVid 19 related.  She reprots an intense feelings of anxiety when she leaves the home. The past 30 days, she experiences trouble breathing, senses that something bad will happen if she leaves the home.  She has difficulty going to the store to purchase groceries. Irma began Vistaril about 2 months ago.  While watching tv or playing games on her cell phone her mind wanders to "what if" questions. Zandaya is currently disabled  with a full time work history at [employer] for over 10 years. Tyrah will benefit from weekly VBH session to reduce symptoms.   [name], LCSW

## 2019-07-17 ENCOUNTER — Telehealth: Payer: Self-pay | Admitting: Licensed Clinical Social Worker

## 2019-07-17 DIAGNOSIS — F4323 Adjustment disorder with mixed anxiety and depressed mood: Secondary | ICD-10-CM

## 2019-07-17 NOTE — BH Specialist Note (Signed)
Virtual Behavioral Health Treatment Plan Team Note  MRN: [id number] NAME: Jamie Mcgee  DATE: 07/17/19  Start time:9a   End time:  915a Total time:  28min  Total number of Virtual [place] Treatment Team Plan encounters: 1 Treatment Team Attendees: Dr. Einar Grad, Royal Piedra, LCSW  Diagnoses: No diagnosis found.  Goals, Interventions and Follow-up Plan Goals: Increase healthy adjustment to current life circumstances Jamie Mcgee wants to be able to leave her home without having panic attacks Interventions: Mindfulness or Relaxation Training Brief CBT Medication Management Recommendations: follow up with PCP in 2 weeks to determine if medications are effective Follow-up Plan: Weekly VBH sessions  History of the present illness Presenting Problem/Current Symptoms: continued symptoms of DX  Psychiatric History  Depression: Yes Anxiety: Yes Mania: No Psychosis: No PTSD symptoms: No  Past Psychiatric History/Hospitalization(s): Hospitalization for psychiatric illness: No Prior Suicide Attempts: No Prior Self-injurious behavior: No  Psychosocial stressors   Virtual [place] Phone Follow Up from 07/16/2019 in [place]  Current Stressors  Other (Comment) [CoVid 19]  Familial Stressors  None  Sleep  Decreased  Appetite  No problems  Coping ability  Overwhelmed  Patient taking medications as prescribed  Yes       Self-harm Behaviors Risk Assessment   Virtual [place] Phone Follow Up from 07/16/2019 in [place]  Have you recently had any thoughts about harming yourself?  No       Screenings PHQ-9 Assessments:  Depression screen [facility] 2/9 06/10/2019 12/23/2018 08/20/2018  Decreased Interest 3 0 2  Down, Depressed, Hopeless 3 0 0  PHQ - 2 Score 6 0 2  Altered sleeping 1 0 0  Tired, decreased energy 3 0 1  Change in appetite 2 0 0  Feeling bad or failure about yourself  1 0 0  Trouble concentrating 3 0 2  Moving slowly or fidgety/restless 0 0  0  Suicidal thoughts 0 0 0  PHQ-9 Score 16 0 5  Difficult doing work/chores Very difficult - -  Some recent data might be hidden   GAD-7 Assessments:  GAD 7 : Generalized Anxiety Score 06/10/2019 08/20/2018 04/19/2018  Nervous, Anxious, on Edge 3 0 3  Control/stop worrying 3 0 3  Worry too much - different things 3 0 3  Trouble relaxing 3 2 3   Restless 3 2 3   Easily annoyed or irritable 0 2 3  Afraid - awful might happen 0 0 3  Total GAD 7 Score 15 6 21   Anxiety Difficulty Very difficult - Extremely difficult    Past Medical History Past Medical History:  Diagnosis Date   Carpal tunnel syndrome on right    Cervical stenosis of spine    C5-6   COPD (chronic obstructive pulmonary disease) (HCC)    Gold III   Depression    Fibromuscular dysplasia of renal artery ([place]) 2006   GERD (gastroesophageal reflux disease)    Hepatitis    type A in 1977   HNP (herniated nucleus pulposus), cervical    HTN (hypertension)    Neuropathy     Vital signs: There were no vitals filed for this visit.  Allergies:  Allergies as of 07/17/2019 - Review Complete 07/09/2019  Allergen Reaction Noted   Lipitor [atorvastatin] Other (See Comments) 10/13/2014   Naproxen Nausea And Vomiting 06/04/2015    Medication History Current medications:  Outpatient Encounter Medications as of 07/17/2019  Medication Sig   busPIRone (BUSPAR) 10 MG tablet Take 1 tablet (10 mg total) by mouth  3 (three) times daily.   carvedilol (COREG) 6.25 MG tablet Take 1 tablet (6.25 mg total) by mouth 2 (two) times daily with a meal.   citalopram (CELEXA) 40 MG tablet Take 1 tablet (40 mg total) by mouth daily.   diclofenac sodium (VOLTAREN) 1 % GEL Apply 4 g topically 4 (four) times daily.   gabapentin (NEURONTIN) 300 MG capsule Take 3 capsules (900 mg total) by mouth 2 (two) times daily.   hydrochlorothiazide (HYDRODIURIL) 25 MG tablet Take 1 tablet (25 mg total) by mouth daily.   hydrOXYzine (ATARAX/VISTARIL) 50 MG tablet  Take 0.5-1 tablets (25-50 mg total) by mouth 3 (three) times daily as needed for anxiety (sleep).   losartan (COZAAR) 100 MG tablet Take 1 tablet (100 mg total) by mouth daily.   methocarbamol (ROBAXIN) 750 MG tablet Take 1 tablet (750 mg total) by mouth every 8 (eight) hours as needed for muscle spasms.   nitroGLYCERIN (NITROSTAT) 0.4 MG SL tablet Place 1 tablet (0.4 mg total) under the tongue every 5 (five) minutes as needed.   rosuvastatin (CRESTOR) 5 MG tablet Take 1 tablet (5 mg total) by mouth at bedtime.   No facility-administered encounter medications on file as of 07/17/2019.     Scribe for Treatment Team: [name], LCSW

## 2019-07-30 ENCOUNTER — Telehealth (HOSPITAL_COMMUNITY): Payer: Self-pay | Admitting: Psychiatry

## 2019-07-30 NOTE — Telephone Encounter (Signed)
[facility] ([place]) Psychiatric Consultant Case Review   MILLICENT SHAREEF is a 59 y.o. year old female with history of anxiety, depression, hypertension, COPD, hyperlipidemia, chronic low back pain with sciatica. Worsening in depression/anxiety in the context of COVID 19 pandemic. She is  adherent to medication. No recent medication change based on the chart review.   Assessment/Provisional Diagnosis # GAD # MDD Worsening in depression/anxiety in the context of pandemic. Will provide weekly CBT/exposure therapy. Will consider adjunctive treatment for depression/anxiety and/or switching to other SSRI/SNRI if her mood symptoms are refractory to therapy.   Recommendation - [facility] specialist to continue weekly therapy   - on citalopram 40 mg daily, buspirone 10 mg TID, hydroxyzine 25-50 mg TID prn for anxiety  Thank you for your consult. We will continue to follow the patient. Please contact [facility]  for any questions or concerns.   The above treatment considerations and suggestions are based on consultation with the [facility] specialist and/or PCP and a review of information available in the shared registry and the patient's [facility] Record (EHR). I have not personally examined the patient. All recommendations should be implemented with consideration of the patient's relevant prior history and current clinical status. Please feel free to call me with any questions about the care of this patient.

## 2019-07-31 ENCOUNTER — Ambulatory Visit (INDEPENDENT_AMBULATORY_CARE_PROVIDER_SITE_OTHER): Payer: Medicaid Other | Admitting: Orthopaedic Surgery

## 2019-07-31 ENCOUNTER — Ambulatory Visit (INDEPENDENT_AMBULATORY_CARE_PROVIDER_SITE_OTHER): Payer: Medicaid Other

## 2019-07-31 ENCOUNTER — Ambulatory Visit: Payer: Self-pay

## 2019-07-31 ENCOUNTER — Encounter: Payer: Self-pay | Admitting: Orthopaedic Surgery

## 2019-07-31 VITALS — Ht 64.0 in | Wt 196.0 lb

## 2019-07-31 DIAGNOSIS — Z981 Arthrodesis status: Secondary | ICD-10-CM | POA: Diagnosis not present

## 2019-07-31 DIAGNOSIS — M25552 Pain in left hip: Secondary | ICD-10-CM

## 2019-07-31 DIAGNOSIS — M25551 Pain in right hip: Secondary | ICD-10-CM

## 2019-07-31 DIAGNOSIS — M545 Low back pain: Secondary | ICD-10-CM | POA: Diagnosis not present

## 2019-07-31 DIAGNOSIS — G8929 Other chronic pain: Secondary | ICD-10-CM | POA: Diagnosis not present

## 2019-07-31 DIAGNOSIS — M542 Cervicalgia: Secondary | ICD-10-CM | POA: Diagnosis not present

## 2019-07-31 NOTE — Progress Notes (Signed)
Office Visit Note   Patient: Jamie Mcgee           Date of Birth: 02/18/1961           MRN: [id number] Visit Date: 07/31/2019              Requested by: [name], DO [place],  [place] [address] PCP: [name], DO   Assessment & Plan: Visit Diagnoses:  1. Chronic bilateral low back pain, unspecified whether sciatica present   2. Bilateral hip pain   3. Neck pain     Plan: Flexion-extension x-ray cervical spine shows good graft incorporation of single level fusion.  She has trace anterolisthesis above and below her fusion. Patient is used Biofreeze Voltaren gel gabapentin muscle muscle relaxants.  Fusion looks solid ,adjacent level changes were mild with trace anterolisthesis and no compression on previous MRI scan.  We discussed smoking cessation a walking program to help her neck.  She can return if she develops increased symptoms.  She will call us back if she like proceed with physical therapy referral. Follow-Up Instructions: No follow-ups on file.   Orders:  Orders Placed This Encounter  Procedures  . XR HIPS BILAT W OR W/O PELVIS 3-4 VIEWS  . XR Cervical Spine 2 or 3 views  . XR Lumbar Spine Complete   No orders of the defined types were placed in this encounter.     Procedures: No procedures performed   Clinical Data: No additional findings.   Subjective: Chief Complaint  Patient presents with  . Lower Back - Pain  . Right Hip - Pain  . Left Hip - Pain    HPI 59 year old female here for appointment for evaluation of low back pain hip pain.  Patient states she has numbness in her feet which is been present for years.  She has burning or throbbing in her feet.  She has to sleep with nothing on her feet she has had injections in her back which have not helped.  She has been seen at spine [facility] scoliosis specialist and I have 20 pages from their office with outline for treatment diagnostic imaging epidurals.  MRI scan 2019 showed  some mild facet edema on the left at L4-5 and 3 mm anterolisthesis at L4-5 without compression.  Cervical MRI scan 5 months post cervical fusion with scan done 03/28/2018 and fusion done 0 5-19 by me showed some incomplete arthrodesis at C5-6 with some anterolisthesis 1 mm at C6-7.  Shallow central and right protrusion at C4-5 with 1 mm anterolisthesis.  No significant compression noted.  Patient been treated with anti-inflammatories muscle relaxants epidural injections.  Patient is continuing to smoke.  Previous tubal ligation 84 breast biopsy 99 gallbladder surgery in the past.  Patient is single and disabled.  She smokes more than 2 packs/day x 40 years does not drink alcohol.  Review of Systems positive for anxiety bladder problems bronchitis cataracts depression emphysema history of hepatitis hypertension migraines teeth and gum disease remote scarlet fever.  Previous carpal tunnel release.  Sleep apnea.   Objective: Vital Signs: Ht 5\' 4"  (1.626 m)   Wt 196 lb (88.9 kg)   LMP 05/25/2011   BMI 33.64 kg/m   Physical Exam Constitutional:      Appearance: She is well-developed.  HENT:     Head: Normocephalic.     Right Ear: External ear normal.     Left Ear: External ear normal.  Eyes:  Pupils: Pupils are equal, round, and reactive to light.  Neck:     Thyroid: No thyromegaly.     Trachea: No tracheal deviation.  Cardiovascular:     Rate and Rhythm: Normal rate.  Pulmonary:     Effort: Pulmonary effort is normal.  Abdominal:     Palpations: Abdomen is soft.  Skin:    General: Skin is warm and dry.  Neurological:     Mental Status: She is alert and oriented to person, place, and time.  Psychiatric:        Behavior: Behavior normal.     Ortho Exam well-healed cervical incision.  Upper extremity reflexes are 2+ and symmetrical.  No isolated motor weakness no impingement of the shoulders.  No lower extremity clonus.  Specialty Comments:  No specialty comments  available.  Imaging: XR HIPS BILAT W OR W/O PELVIS 3-4 VIEWS  Result Date: 08/01/2019 [facility] and frog-leg x-ray right and left hip obtained and reviewed.  This shows no significant arthritis in the hips there is no acute bone changes noted. Impression: Normal pelvis and hip x-rays.  XR Lumbar Spine Complete  Result Date: 08/01/2019 AP lateral lumbar spine x-ray with lateral flexion-extension images shows 3 mm anterolisthesis L4-5 without instability on flexion-extension.  Facet arthropathy noted at L4-5 more than L5-S1. Impression: Grade 1 anterolisthesis L4-5 degenerative in nature.  XR Cervical Spine 2 or 3 views  Result Date: 08/01/2019 AP lateral cervical spine x-ray demonstrates C5-6 fusion without motion on flexion-extension and good graft incorporation.  Slight spurring anteriorly at C4-5. Impression: Previous cervical fusion with allograft plate and screws [id number] unchanged from 2019 images.    PMFS History: Patient Active Problem List   Diagnosis Date Noted  . Moderate episode of recurrent major depressive disorder ([place]) 08/20/2018  . Pure hypercholesterolemia 08/20/2018  . Chronic bilateral low back pain with bilateral sciatica 02/15/2018  . Numbness and tingling of foot 02/15/2018  . OSA (obstructive sleep apnea) 01/18/2018  . Hypersomnolence 12/28/2017  . History of carpal tunnel release 11/15/2017  . History of fusion of cervical spine 10/18/2017  . COPD  GOLD 0 / still smoking  07/12/2017  . Osteopenia 11/22/2016  . Pre-diabetes 12/25/2015  . Morbid obesity ([place]) 12/25/2015  . Cramp of both lower extremities 07/02/2015  . GERD (gastroesophageal reflux disease) 05/07/2015  . Vitamin D deficiency 05/07/2015  . Metabolic syndrome [id number]  . Post-menopausal bleeding 05/07/2015  . Cigarette smoker 03/08/2015  . Depressed 12/19/2013  . Essential hypertension, benign 12/19/2013   Past Medical History:  Diagnosis Date  . Carpal tunnel syndrome on right   . Cervical  stenosis of spine    C5-6  . COPD (chronic obstructive pulmonary disease) ([place])    Gold III  . Depression   . 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 GERD (gastroesophageal reflux disease)   . Hepatitis    type A in 1977  . HNP (herniated nucleus pulposus), cervical   . HTN (hypertension)   . Neuropathy     Family History  Problem Relation Age of Onset  . Coronary artery disease Father        States congenital abnormality  . Diabetes Father   . Hyperlipidemia Father   . CAD Sister        Stents   . Cancer Sister        breast  . Cancer Mother        breast  . Cancer Maternal Aunt  breast  . Cancer Maternal Grandmother        breast  . Anesthesia problems Neg Hx   . Hypotension Neg Hx   . Malignant hyperthermia Neg Hx   . Pseudochol deficiency Neg Hx     Past Surgical History:  Procedure Laterality Date  . ANTERIOR CERVICAL DECOMP/DISCECTOMY FUSION N/A 10/05/2017   Procedure: C5-6 ANTERIOR CERVICAL DECOMPRESSION/DISCECTOMY FUSION,  ALLOGRAFT, PLATE  (NECK FIRST AND CARPAL TUNNEL RELEASE SECOND);  Surgeon: Marybelle Killings, MD;  Location: [facility];  Service: Orthopedics;  Laterality: N/A;  . APPENDECTOMY  1993  . BREAST BIOPSY    . CARPAL TUNNEL RELEASE Right 10/05/2017   Procedure: RIGHT CARPAL TUNNEL RELEASE;  Surgeon: Marybelle Killings, MD;  Location: [facility];  Service: Orthopedics;  Laterality: Right;  . CATARACT EXTRACTION  08-2010   left   . CATARACT EXTRACTION W/PHACO  06/01/2011   Procedure: CATARACT EXTRACTION PHACO AND INTRAOCULAR LENS PLACEMENT (IOC);  Surgeon: Tonny Branch;  Location: [facility];  Service: Ophthalmology;  Laterality: Right;  CDE:9.62  . CHOLECYSTECTOMY    . RENAL ARTERY ANGIOPLASTY  2006   right  . TUBAL LIGATION  1984   [facility]   Social History   Occupational History  . Not on file  Tobacco Use  . Smoking status: Current Every Day Smoker    Packs/day: 1.00    Years: 40.00    Pack years: 40.00    Types: Cigarettes  . Smokeless  tobacco: Never Used  . Tobacco comment: pt quit smoking 2016 for 7 months but restarted last year  Substance and Sexual Activity  . Alcohol use: No    Alcohol/week: 0.0 standard drinks  . Drug use: Yes    Frequency: 0.5 times per week    Types: Marijuana    Comment: weekly  . Sexual activity: Yes    Birth control/protection: None, Post-menopausal

## 2019-08-04 ENCOUNTER — Encounter: Payer: Self-pay | Admitting: Orthopaedic Surgery

## 2019-08-05 ENCOUNTER — Other Ambulatory Visit: Payer: Self-pay | Admitting: Family Medicine

## 2019-08-05 DIAGNOSIS — F4323 Adjustment disorder with mixed anxiety and depressed mood: Secondary | ICD-10-CM

## 2019-08-05 NOTE — Telephone Encounter (Signed)
Last office visit 07/09/2019

## 2019-08-31 IMAGING — CT CT ANGIO CHEST
2 of 6 series · 19 of 36 positions shown · IV contrast (Isovue)
Comparison: None

CLINICAL DATA: Shortness of breath for 2-3 weeks. Elevated D-dimer.

EXAM:
CT ANGIOGRAPHY CHEST WITH CONTRAST
TECHNIQUE: Multidetector CT imaging of the chest was performed using the
standard protocol during bolus administration of intravenous
contrast. Multiplanar CT image reconstructions and MIPs were
obtained to evaluate the vascular anatomy.
CONTRAST:  100mL NXEZOO-6EE IOPAMIDOL (NXEZOO-6EE) INJECTION 76%

[Series 5: thins · axial · 0.58mm/px · z∈[+1909,+2169]mm · 18 of 290 slices shown]
[im 15/290  lung]
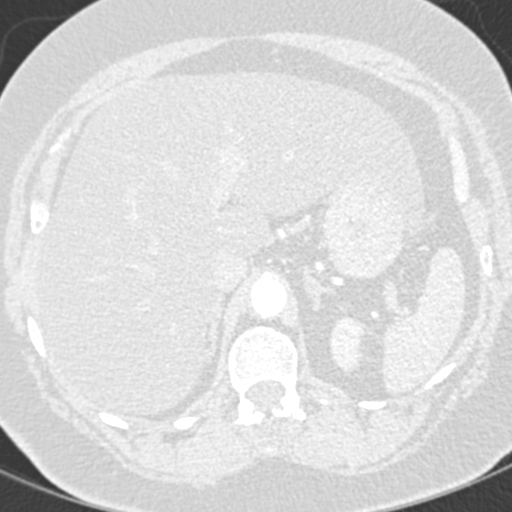
[im 29/290  mediastinal]
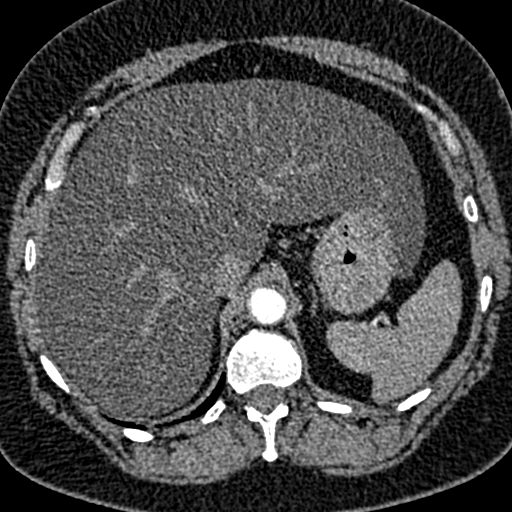
[im 44/290  lung]
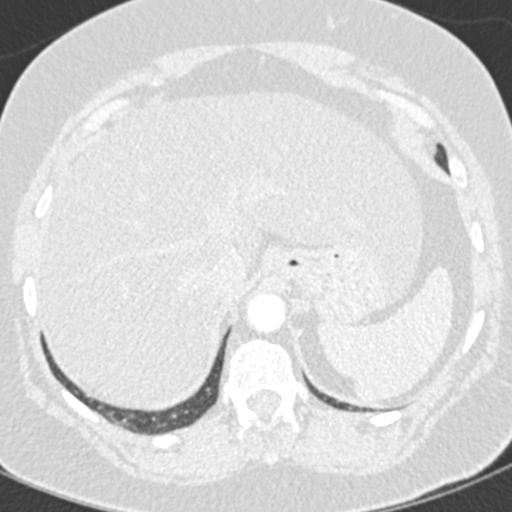
[im 58/290  mediastinal]
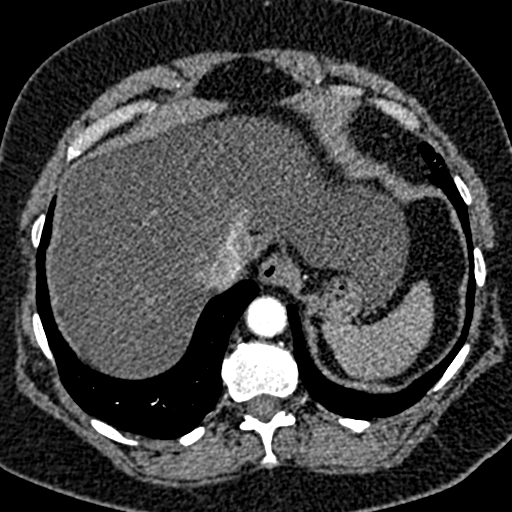
[im 73/290  lung]
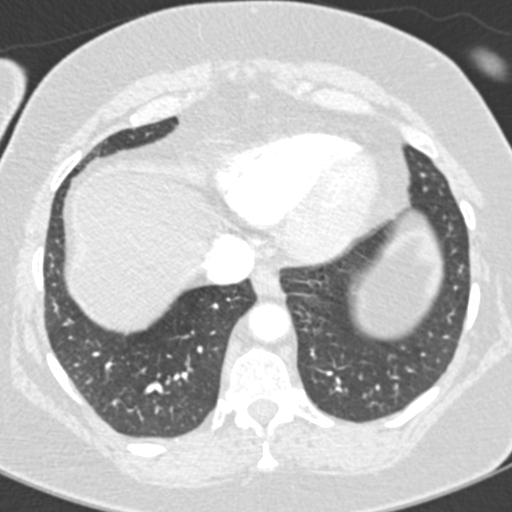
[im 87/290  mediastinal]
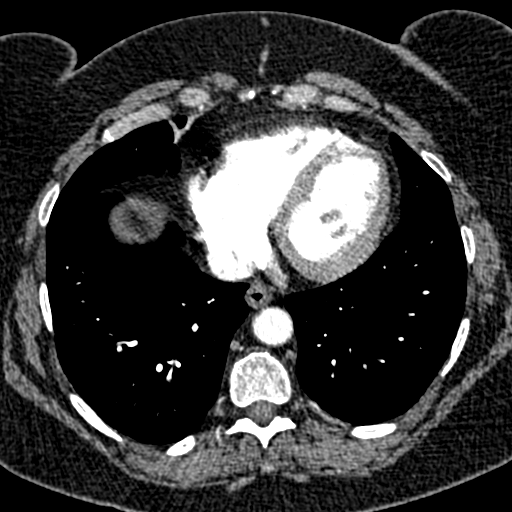
[im 102/290  lung]
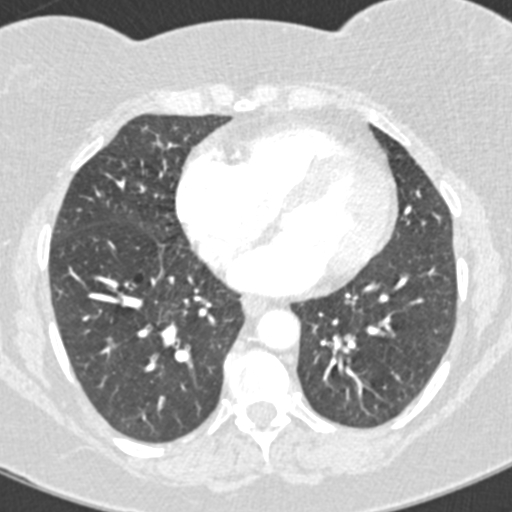
[im 116/290  mediastinal]
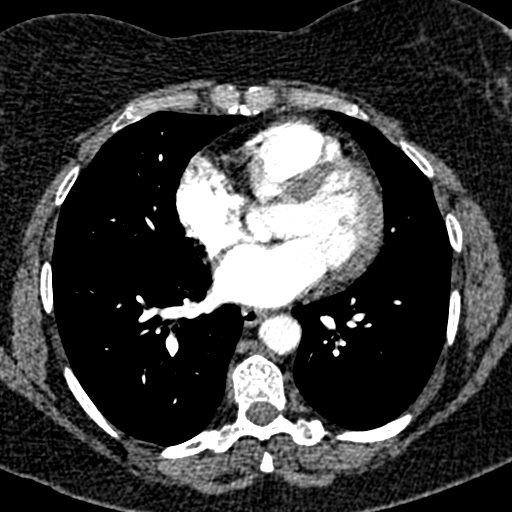
[im 131/290  lung]
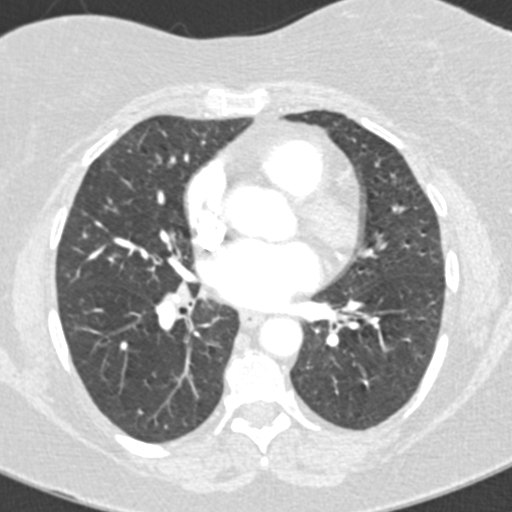
[im 159/290  mediastinal]
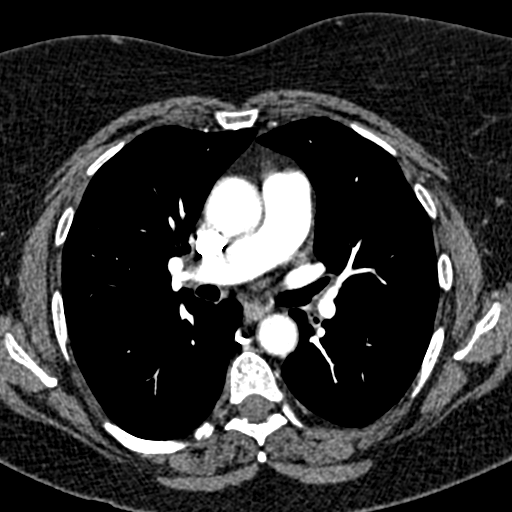
[im 174/290  lung]
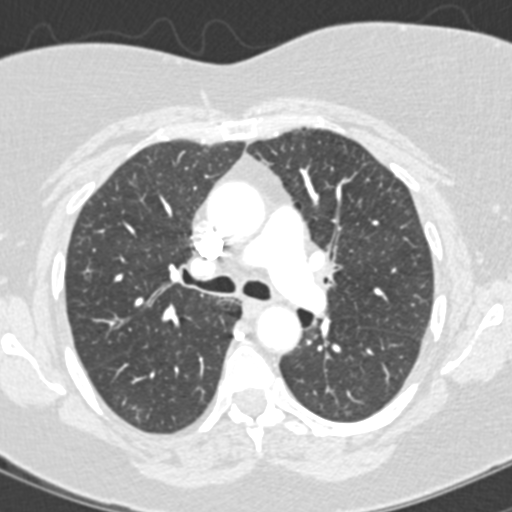
[im 188/290  mediastinal]
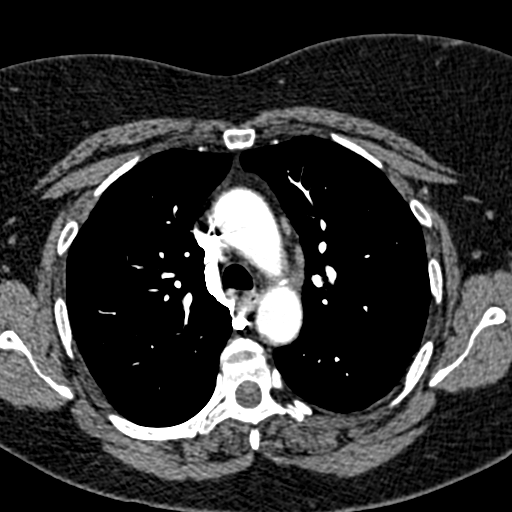
[im 203/290  lung]
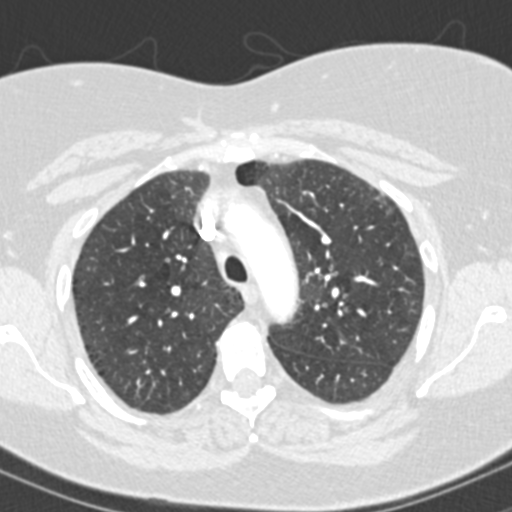
[im 217/290  mediastinal]
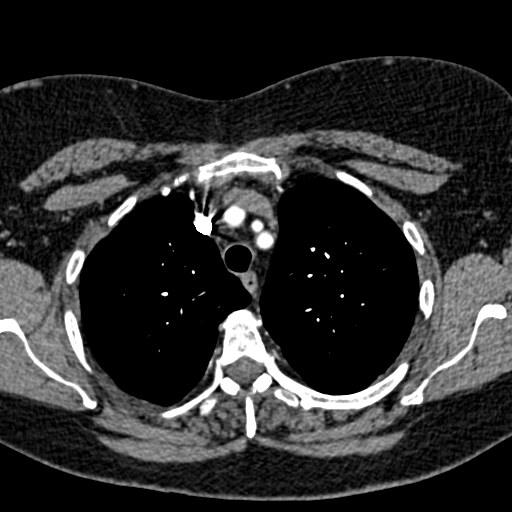
[im 232/290  lung]
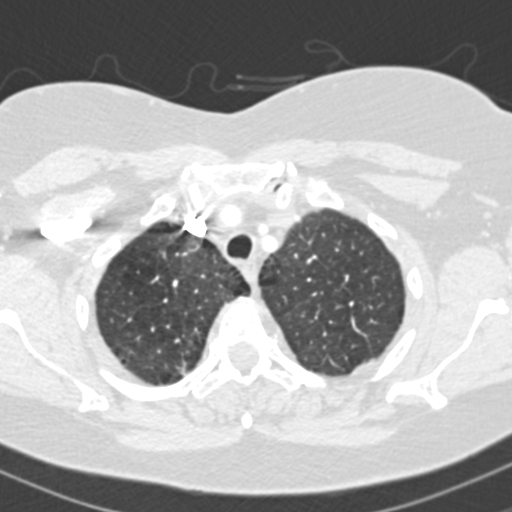
[im 246/290  mediastinal]
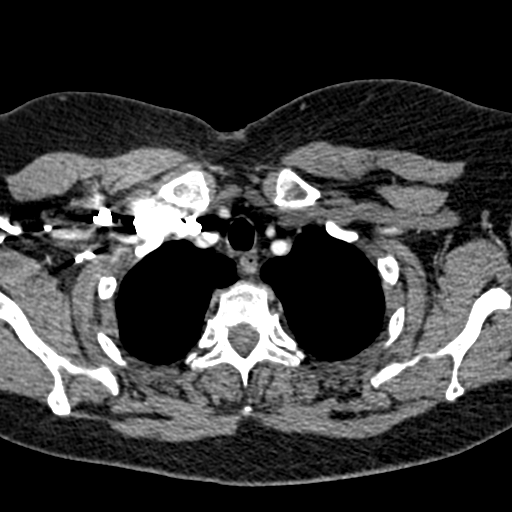
[im 261/290  lung]
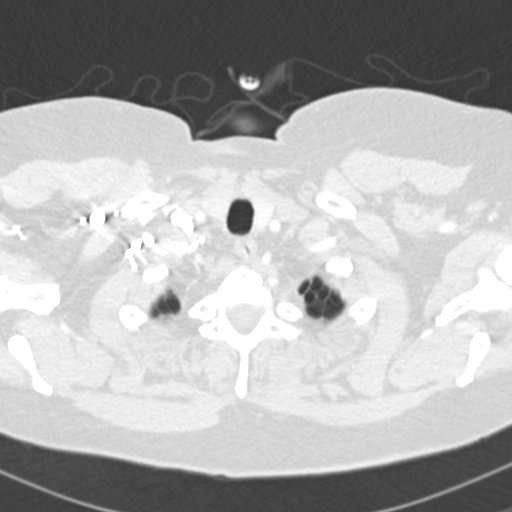
[im 275/290  mediastinal]
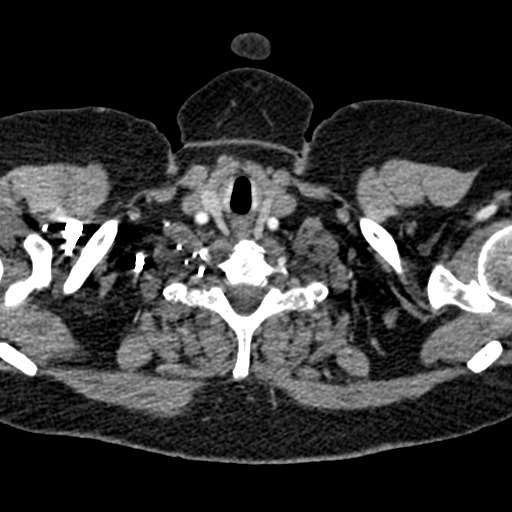

[Series 7: coronal mpr · coronal · 0.58mm/px · 1 of 123 slices shown]
[im 62/123  mediastinal]
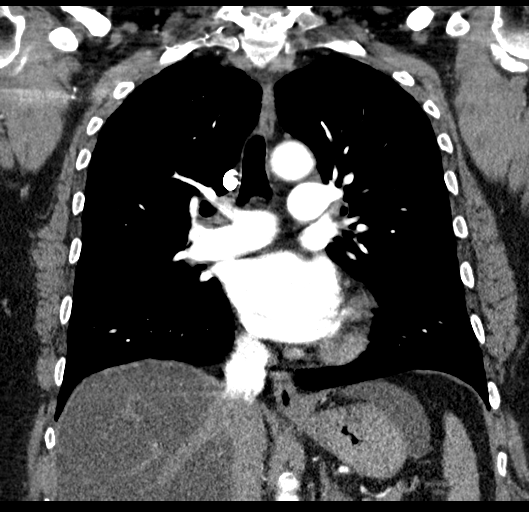

[19 of 36 positions shown; findings below may reference images not displayed]

FINDINGS: Cardiovascular: Satisfactory opacification of the pulmonary arteries
to the segmental level. No evidence of pulmonary embolism. Normal
heart size. No pericardial effusion. No lobar or segmental pulmonary
artery filling defects identified.

Mediastinum/Nodes: No enlarged mediastinal, hilar, or axillary lymph
nodes. Thyroid gland, trachea, and esophagus demonstrate no
significant findings.

Lungs/Pleura: No pleural effusion identified. No airspace opacities
identified. Scar versus subsegmental atelectasis within the right
middle lobe, image 99 of series 6. Mild changes of emphysema. Mild
diffuse bronchial wall thickening is identified. There are a few
scratch set there is a cluster scattered small tree-in-bud nodules
within the left lung base, image 108 of series 6.

Upper Abdomen: Marked diffuse hepatic steatosis. No acute
abnormality.

Musculoskeletal: There is mild spondylosis identified within the
thoracic spine. No aggressive lytic or sclerotic bone lesions.

Review of the MIP images confirms the above findings.
IMPRESSION: 1. No evidence for acute pulmonary embolus.
2. Small cluster of tree-in-bud nodules within the left lower lobe
compatible with mild inflammatory or infectious bronchiolitis.
3. Hepatic steatosis.

## 2019-09-02 IMAGING — NM NM MYOCAR MULTI W/SPECT W/WALL MOTION & EF
2 series · 12 of 12 positions shown · non-contrast
Comparison: none

[Series 1: rest · 6.51mm/px · 6 of 63 frames shown]
[frame 6/63]
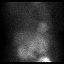
[frame 16/63]
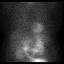
[frame 27/63]
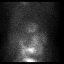
[frame 37/63]
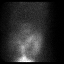
[frame 48/63]
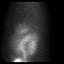
[frame 58/63]
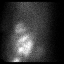

[Series 3: stress gated - perfusion · 6.51mm/px · 6 of 64 frames shown]
[frame 6/64]
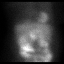
[frame 16/64]
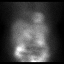
[frame 27/64]
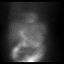
[frame 38/64]
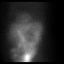
[frame 48/64]
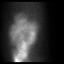
[frame 59/64]
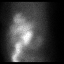

[12 of 12 positions shown; findings below may reference images not displayed]

Canned report from images found in remote index.

Refer to host system for actual result text.

## 2019-09-08 ENCOUNTER — Other Ambulatory Visit: Payer: Self-pay | Admitting: Family Medicine

## 2019-09-08 DIAGNOSIS — M5442 Lumbago with sciatica, left side: Secondary | ICD-10-CM

## 2019-09-08 DIAGNOSIS — M542 Cervicalgia: Secondary | ICD-10-CM

## 2019-09-08 DIAGNOSIS — G8929 Other chronic pain: Secondary | ICD-10-CM

## 2019-09-08 NOTE — Telephone Encounter (Signed)
Last office visit 06/10/2019 Last refill 12/23/2018, #90, 5 refills

## 2019-10-08 ENCOUNTER — Telehealth (INDEPENDENT_AMBULATORY_CARE_PROVIDER_SITE_OTHER): Payer: Medicaid Other | Admitting: Licensed Clinical Social Worker

## 2019-10-08 DIAGNOSIS — F331 Major depressive disorder, recurrent, moderate: Secondary | ICD-10-CM

## 2019-10-08 DIAGNOSIS — F4323 Adjustment disorder with mixed anxiety and depressed mood: Secondary | ICD-10-CM

## 2019-10-08 NOTE — Telephone Encounter (Signed)
Left message encouraging contact 

## 2019-10-13 ENCOUNTER — Other Ambulatory Visit: Payer: Self-pay | Admitting: Family Medicine

## 2019-10-13 DIAGNOSIS — F4323 Adjustment disorder with mixed anxiety and depressed mood: Secondary | ICD-10-CM

## 2019-10-29 ENCOUNTER — Telehealth (INDEPENDENT_AMBULATORY_CARE_PROVIDER_SITE_OTHER): Payer: Medicaid Other | Admitting: Licensed Clinical Social Worker

## 2019-10-29 DIAGNOSIS — F331 Major depressive disorder, recurrent, moderate: Secondary | ICD-10-CM

## 2019-10-29 NOTE — BH Specialist Note (Signed)
Virtual Behavioral Health Treatment Plan Team Note  MRN: [id number] NAME: Jamie Mcgee  DATE: 10/29/19  Start time:  215p End time:  225p Total time:  10 min  Total number of [facility] [place] Treatment Team Plan encounters: 2/4  Treatment Team Attendees: Dr. Modesta Messing, Royal Piedra, LCSW  Diagnoses:    ICD-10-CM   1. Moderate episode of recurrent major depressive disorder (HCC)  F33.1     Goals, Interventions and Follow-up Plan Goals: Increase healthy adjustment to current life circumstances Interventions: Continue VBH Medication Management Recommendations: continue with current medication regimen; no changes Follow-up Plan: Patient requested monthly follow up calls since she is doing well  Assist with prevention plan and continue to monitor monthly  History of the present illness Presenting Problem/Current Symptoms: reports a reduction in depression and anxiety (decreased concentration, sadness, isolation, worry, appetite)  Psychiatric History  Depression: Yes Anxiety: Yes Mania: No Psychosis: No PTSD symptoms: No  Past Psychiatric History/Hospitalization(s): Hospitalization for psychiatric illness: No Prior Suicide Attempts: No Prior Self-injurious behavior: No  Psychosocial stressors   Virtual [place] Phone Follow Up from 07/16/2019 in [place]  Current Stressors  Other (Comment) [CoVid 19]  Familial Stressors  None  Sleep  Decreased  Appetite  No problems  Coping ability  Overwhelmed  Patient taking medications as prescribed  Yes      Self-harm Behaviors Risk Assessment   Virtual [place] Phone Follow Up from 07/16/2019 in [place]  Have you recently had any thoughts about harming yourself?  No      Screenings PHQ-9 Assessments:  Depression screen [facility] 2/9 10/29/2019 06/10/2019 12/23/2018  Decreased Interest 2 3 0  Down, Depressed, Hopeless 1 3 0  PHQ - 2 Score 3 6 0  Altered sleeping 1 1 0  Tired, decreased energy 1 3  0  Change in appetite 2 2 0  Feeling bad or failure about yourself  1 1 0  Trouble concentrating 1 3 0  Moving slowly or fidgety/restless 1 0 0  Suicidal thoughts - 0 0  PHQ-9 Score 10 16 0  Difficult doing work/chores - Very difficult -  Some recent data might be hidden   GAD-7 Assessments:  GAD 7 : Generalized Anxiety Score 10/29/2019 06/10/2019 08/20/2018 04/19/2018  Nervous, Anxious, on Edge 2 3 0 3  Control/stop worrying 1 3 0 3  Worry too much - different things 1 3 0 3  Trouble relaxing 2 3 2 3   Restless 2 3 2 3   Easily annoyed or irritable 1 0 2 3  Afraid - awful might happen 1 0 0 3  Total GAD 7 Score 10 15 6 21   Anxiety Difficulty - Very difficult - Extremely difficult    Past Medical History Past Medical History:  Diagnosis Date  . Carpal tunnel syndrome on right   . Cervical stenosis of spine    C5-6  . COPD (chronic obstructive pulmonary disease) ([place])    Gold III  . Depression   . 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 GERD (gastroesophageal reflux disease)   . Hepatitis    type A in 1977  . HNP (herniated nucleus pulposus), cervical   . HTN (hypertension)   . Neuropathy     Vital signs: There were no vitals filed for this visit.  Allergies:  Allergies as of 10/29/2019 - Review Complete 08/04/2019  Allergen Reaction Noted  . Lipitor [atorvastatin] Other (See Comments) 10/13/2014  . Naproxen Nausea And Vomiting 06/04/2015  Medication History Current medications:  Outpatient Encounter Medications as of 10/29/2019  Medication Sig  . busPIRone (BUSPAR) 10 MG tablet Take 1 tablet (10 mg total) by mouth 3 (three) times daily. Needs to be seen for future refills.  . carvedilol (COREG) 6.25 MG tablet Take 1 tablet (6.25 mg total) by mouth 2 (two) times daily with a meal.  . citalopram (CELEXA) 40 MG tablet Take 1 tablet (40 mg total) by mouth daily.  . diclofenac sodium (VOLTAREN) 1 % GEL Apply 4 g topically 4 (four) times daily.  Marland Kitchen  gabapentin (NEURONTIN) 300 MG capsule Take 3 capsules (900 mg total) by mouth 2 (two) times daily.  . hydrochlorothiazide (HYDRODIURIL) 25 MG tablet Take 1 tablet (25 mg total) by mouth daily.  . hydrOXYzine (ATARAX/VISTARIL) 50 MG tablet TAKE 1/2 TO 1 TABLET BY MOUTH THREE TIMES DAILY AS NEEDED FOR ANXIETY (SLEEP)  . losartan (COZAAR) 100 MG tablet Take 1 tablet (100 mg total) by mouth daily.  . methocarbamol (ROBAXIN) 750 MG tablet TAKE ONE TABLET BY MOUTH EVERY 8 HOURS AS NEEDED FOR MUSCLE SPASMS.  . nitroGLYCERIN (NITROSTAT) 0.4 MG SL tablet Place 1 tablet (0.4 mg total) under the tongue every 5 (five) minutes as needed.  . rosuvastatin (CRESTOR) 5 MG tablet Take 1 tablet (5 mg total) by mouth at bedtime.   No facility-administered encounter medications on file as of 10/29/2019.     Scribe for Treatment Team: [name], LCSW

## 2019-10-29 NOTE — BH Specialist Note (Signed)
[facility] Telephone Follow-up  MRN: [id number] NAME: Jamie Mcgee Date: 10/29/19  Start time:  930a End time:  10a Total time:  75min Call number:  [phone]  Reason for call today:   follow up PHQ-9 Scores:  Depression screen [facility] 2/9 10/29/2019 06/10/2019 12/23/2018 08/20/2018 05/15/2018  Decreased Interest 2 3 0 2 2  Down, Depressed, Hopeless - 3 0 0 1  PHQ - 2 Score 2 6 0 2 3  Altered sleeping - 1 0 0 2  Tired, decreased energy - 3 0 1 2  Change in appetite - 2 0 0 1  Feeling bad or failure about yourself  - 1 0 0 1  Trouble concentrating - 3 0 2 1  Moving slowly or fidgety/restless - 0 0 0 0  Suicidal thoughts - 0 0 0 0  PHQ-9 Score - 16 0 5 10  Difficult doing work/chores - Very difficult - - Somewhat difficult  Some recent data might be hidden   GAD-7 Scores:  GAD 7 : Generalized Anxiety Score 10/29/2019 06/10/2019 08/20/2018 04/19/2018  Nervous, Anxious, on Edge 2 3 0 3  Control/stop worrying 1 3 0 3  Worry too much - different things 1 3 0 3  Trouble relaxing 2 3 2 3   Restless 2 3 2 3   Easily annoyed or irritable 1 0 2 3  Afraid - awful might happen 1 0 0 3  Total GAD 7 Score 10 15 6 21   Anxiety Difficulty - Very difficult - Extremely difficult    Stress Current stressors:  car is inoperable Sleep:  adequate Appetite:  adequate Coping ability:  normal Patient taking medications as prescribed:  Jamie Mcgee reports that she consumes medication as directed  Current medications:  Outpatient Encounter Medications as of 10/29/2019  Medication Sig  . busPIRone (BUSPAR) 10 MG tablet Take 1 tablet (10 mg total) by mouth 3 (three) times daily. Needs to be seen for future refills.  . carvedilol (COREG) 6.25 MG tablet Take 1 tablet (6.25 mg total) by mouth 2 (two) times daily with a meal.  . citalopram (CELEXA) 40 MG tablet Take 1 tablet (40 mg total) by mouth daily.  . diclofenac sodium (VOLTAREN) 1 % GEL Apply 4 g topically 4 (four) times daily.  Marland Kitchen gabapentin  (NEURONTIN) 300 MG capsule Take 3 capsules (900 mg total) by mouth 2 (two) times daily.  . hydrochlorothiazide (HYDRODIURIL) 25 MG tablet Take 1 tablet (25 mg total) by mouth daily.  . hydrOXYzine (ATARAX/VISTARIL) 50 MG tablet TAKE 1/2 TO 1 TABLET BY MOUTH THREE TIMES DAILY AS NEEDED FOR ANXIETY (SLEEP)  . losartan (COZAAR) 100 MG tablet Take 1 tablet (100 mg total) by mouth daily.  . methocarbamol (ROBAXIN) 750 MG tablet TAKE ONE TABLET BY MOUTH EVERY 8 HOURS AS NEEDED FOR MUSCLE SPASMS.  . nitroGLYCERIN (NITROSTAT) 0.4 MG SL tablet Place 1 tablet (0.4 mg total) under the tongue every 5 (five) minutes as needed.  . rosuvastatin (CRESTOR) 5 MG tablet Take 1 tablet (5 mg total) by mouth at bedtime.   No facility-administered encounter medications on file as of 10/29/2019.     Self-harm Behaviors Risk Assessment Self-harm risk factors:  No concerns Patient endorses recent thoughts of harming self:    [name] Suicide Severity Rating Scale: No flowsheet data found. No flowsheet data found.   Danger to Others Risk Assessment Danger to others risk factors:  No Concerns Patient endorses recent thoughts of harming others:    Dynamic Appraisal of  Situational Aggression (DASA): No flowsheet data found.    Goals, Interventions and Follow-up Plan Goals: Increase healthy adjustment to current life circumstances Interventions: Continue VBH Follow-up Plan: Patient requested monthly follow up calls since she is doing well  Summary: Jamie Mcgee is a 59 year old woman that was referred to [facility] by her PCP, Dr. Lajuana Ripple.  Jamie Mcgee reports that since Buspar was added to her medication regimen that she has not experienced any anxiety or panic symptoms.  She reports that she is able to "get out more" and does not feel as isolated or helpless. Jamie Mcgee reports that her car is not working which is the reason she has been at home, lately.  She requested monthly phone calls.  Jamie South, LCSW

## 2019-11-13 ENCOUNTER — Other Ambulatory Visit: Payer: Self-pay | Admitting: Family Medicine

## 2019-11-13 DIAGNOSIS — F4323 Adjustment disorder with mixed anxiety and depressed mood: Secondary | ICD-10-CM

## 2019-11-13 DIAGNOSIS — G8929 Other chronic pain: Secondary | ICD-10-CM

## 2019-11-13 DIAGNOSIS — M5442 Lumbago with sciatica, left side: Secondary | ICD-10-CM

## 2019-11-13 DIAGNOSIS — I10 Essential (primary) hypertension: Secondary | ICD-10-CM

## 2019-11-13 DIAGNOSIS — E7800 Pure hypercholesterolemia, unspecified: Secondary | ICD-10-CM

## 2019-11-13 DIAGNOSIS — F331 Major depressive disorder, recurrent, moderate: Secondary | ICD-10-CM

## 2019-11-19 ENCOUNTER — Encounter: Payer: Self-pay | Admitting: Nurse Practitioner

## 2019-11-19 ENCOUNTER — Telehealth (INDEPENDENT_AMBULATORY_CARE_PROVIDER_SITE_OTHER): Payer: Medicaid Other | Admitting: Licensed Clinical Social Worker

## 2019-11-19 ENCOUNTER — Telehealth (INDEPENDENT_AMBULATORY_CARE_PROVIDER_SITE_OTHER): Payer: Medicaid Other | Admitting: Nurse Practitioner

## 2019-11-19 DIAGNOSIS — J069 Acute upper respiratory infection, unspecified: Secondary | ICD-10-CM | POA: Diagnosis not present

## 2019-11-19 DIAGNOSIS — F331 Major depressive disorder, recurrent, moderate: Secondary | ICD-10-CM

## 2019-11-19 MED ORDER — AMOXICILLIN-POT CLAVULANATE 875-125 MG PO TABS: 1.0000 | ORAL_TABLET | Freq: Two times a day (BID) | ORAL | 0 refills | Status: DC

## 2019-11-19 NOTE — Telephone Encounter (Signed)
Writer called home phone and mobile phone number for monthly behavioral health session.  No answer.  Left message encouraging contact.

## 2019-11-19 NOTE — Progress Notes (Signed)
Virtual Visit via video Note   Due to COVID-19 pandemic this visit was conducted virtually. This visit type was conducted due to national recommendations for restrictions regarding the COVID-19 Pandemic (e.g. social distancing, sheltering in place) in an effort to limit this patient's exposure and mitigate transmission in our community. All issues noted in this document were discussed and addressed.  A physical exam was not performed with this format.  I connected with  Jamie Mcgee  on 11/19/19 at 1:45 by video and verified that I am speaking with the correct person using two identifiers. Jamie Mcgee is currently located at home and no one is currently with  her during visit. The provider, Mary-Margaret Hassell Done, FNP is located in their office at time of visit.  I discussed the limitations, risks, security and privacy concerns of performing an evaluation and management service by telephone and the availability of in person appointments. I also discussed with the patient that there may be a patient responsible charge related to this service. The patient expressed understanding and agreed to proceed.   History and Present Illness:   Chief Complaint: Cough   HPI Patient c/o sore throat and congestion. Started coughing up thick green mucus 3 day sago. Has developed a low grade fever. She deneis covid exposure and has had 1 of her 2 vaccines for covid.   Review of Systems  Constitutional: Positive for fever. Negative for chills.  HENT: Positive for congestion and sore throat.   Respiratory: Positive for cough and sputum production. Negative for shortness of breath.   Musculoskeletal: Negative.   Neurological: Positive for headaches. Negative for dizziness.  Psychiatric/Behavioral: Negative.   All other systems reviewed and are negative.      Observations/Objective: Alert and oriented- answers all questions appropriately No distress Voice hoarse Dep wet cough  Noted Face  flushed   Assessment and Plan: Jamie Mcgee in today with chief complaint of Cough   1. URI with cough and congestion  1. Take meds as prescribed 2. Use a cool mist humidifier especially during the winter months and when heat has been humid. 3. Use saline nose sprays frequently 4. Saline irrigations of the nose can be very helpful if done frequently.  * 4X daily for 1 week*  * Use of a nettie pot can be helpful with this. Follow directions with this* 5. Drink plenty of fluids 6. Keep thermostat turn down low 7.For any cough or congestion  Use plain Mucinex- regular strength or max strength is fine   * Children- consult with Pharmacist for dosing 8. For fever or aces or pains- take tylenol or ibuprofen appropriate for age and weight.  * for fevers greater than 101 orally you may alternate ibuprofen and tylenol every  3 hours.   Meds ordered this encounter  Medications  . amoxicillin-clavulanate (AUGMENTIN) 875-125 MG tablet    Sig: Take 1 tablet by mouth 2 (two) times daily.    Dispense:  14 tablet    Refill:  0    Order Specific Question:   Supervising Provider    Answer:   Caryl Pina A [[id number]]   Follow Up Instructions: prn    I discussed the assessment and treatment plan with the patient. The patient was provided an opportunity to ask questions and all were answered. The patient agreed with the plan and demonstrated an understanding of the instructions.   The patient was advised to call back or seek an in-person evaluation if the symptoms worsen  or if the condition fails to improve as anticipated.  The above assessment and management plan was discussed with the patient. The patient verbalized understanding of and has agreed to the management plan. Patient is aware to call the clinic if symptoms persist or worsen. Patient is aware when to return to the clinic for a follow-up visit. Patient educated on when it is appropriate to go to the emergency department.    Time call ended:2:00  I provided 15 minutes of face-to-face time during this encounter.    Mary-Margaret Hassell Done, FNP

## 2019-12-03 ENCOUNTER — Telehealth (INDEPENDENT_AMBULATORY_CARE_PROVIDER_SITE_OTHER): Payer: Medicaid Other | Admitting: Licensed Clinical Social Worker

## 2019-12-03 ENCOUNTER — Telehealth: Payer: Self-pay | Admitting: Licensed Clinical Social Worker

## 2019-12-03 DIAGNOSIS — F331 Major depressive disorder, recurrent, moderate: Secondary | ICD-10-CM
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
## 2019-12-03 NOTE — BH Specialist Note (Signed)
Virtual Behavioral Health Treatment Plan Team Note  MRN: [id number] NAME: Jamie Mcgee  DATE: 12/05/19  Start time:  210p End time:  215p Total time:  64min  Total number of Virtual [place] Treatment Team Plan encounters: 3/4  Treatment Team Attendees: Royal Piedra, LCSW; Dr. Modesta Messing, Psychiatrist  Diagnoses:    ICD-10-CM   1. Moderate episode of recurrent major depressive disorder (HCC)  F33.1     Goals, Interventions and Follow-up Plan Goals: Increase healthy adjustment to current life circumstances Interventions: Brief CBT VBH services Medication Management Recommendations: Continue with current medication regimen Follow-up Plan: continue VBH services every month at Patient request.  History of the present illness Presenting Problem/Current Symptoms: Patient continues to have depression and anxiety symptoms.  CoVid 19 excerberated symptoms.  Psychiatric History  Depression: No Anxiety: No Mania: No Psychosis: No PTSD symptoms: No  Past Psychiatric History/Hospitalization(s): Hospitalization for psychiatric illness: No Prior Suicide Attempts: No Prior Self-injurious behavior: No  Psychosocial stressors   Virtual [place] Phone Follow Up from 07/16/2019 in [place]  Current Stressors Other (Comment)  [CoVid 19]  Familial Stressors None  Sleep Decreased  Appetite No problems  Coping ability Overwhelmed  Patient taking medications as prescribed Yes      Self-harm Behaviors Risk Assessment   Virtual [place] Phone Follow Up from 07/16/2019 in [place]  Have you recently had any thoughts about harming yourself? No      Screenings PHQ-9 Assessments:  Depression screen [facility] 2/9 12/03/2019 10/29/2019 06/10/2019  Decreased Interest 1 2 3   Down, Depressed, Hopeless 2 1 3   PHQ - 2 Score 3 3 6   Altered sleeping 1 1 1   Tired, decreased energy 1 1 3   Change in appetite 2 2 2   Feeling bad or failure about yourself  2 1 1   Trouble  concentrating 1 1 3   Moving slowly or fidgety/restless 1 1 0  Suicidal thoughts 0 - 0  PHQ-9 Score 11 10 16   Difficult doing work/chores Not difficult at all - Very difficult  Some recent data might be hidden   GAD-7 Assessments:  GAD 7 : Generalized Anxiety Score 12/03/2019 10/29/2019 06/10/2019 08/20/2018  Nervous, Anxious, on Edge 1 2 3  0  Control/stop worrying 2 1 3  0  Worry too much - different things 1 1 3  0  Trouble relaxing 1 2 3 2   Restless 1 2 3 2   Easily annoyed or irritable 2 1 0 2  Afraid - awful might happen 1 1 0 0  Total GAD 7 Score 9 10 15 6   Anxiety Difficulty Somewhat difficult - Very difficult -    Past Medical History Past Medical History:  Diagnosis Date   Carpal tunnel syndrome on right    Cervical stenosis of spine    C5-6   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  Allergies:  Allergies as of 12/03/2019 - Review Complete 11/19/2019  Allergen Reaction Noted   Lipitor [atorvastatin] Other (See Comments) 10/13/2014   Naproxen Nausea And Vomiting 06/04/2015    Medication History Current medications:  Outpatient Encounter Medications as of 12/03/2019  Medication Sig  amoxicillin-clavulanate (AUGMENTIN) 875-125 MG tablet Take 1 tablet by mouth 2 (two) times daily.   busPIRone (BUSPAR) 10 MG tablet Take 1 tablet (10 mg total) by mouth 3 (three) times daily. Needs to be seen for future refills.   carvedilol (COREG) 6.25 MG tablet Take 1 tablet (6.25 mg total) by mouth 2 (two) times daily with a meal. (Needs to be seen before next refill)   citalopram (CELEXA) 40 MG tablet Take 1 tablet (40 mg total) by mouth daily. (Needs to be seen before  next refill)   diclofenac sodium (VOLTAREN) 1 % GEL Apply 4 g topically 4 (four) times daily.   gabapentin (NEURONTIN) 300 MG capsule Take 3 capsules (900 mg total) by mouth 2 (two) times daily. (Needs to be seen before next refill)   hydrochlorothiazide (HYDRODIURIL) 25 MG tablet Take 1 tablet (25 mg total) by mouth daily. (Needs to be seen before next refill)   hydrOXYzine (ATARAX/VISTARIL) 50 MG tablet TAKE 1/2 TO 1 TABLET BY MOUTH THREE TIMES DAILY AS NEEDED FOR ANXIETY (SLEEP)   losartan (COZAAR) 100 MG tablet Take 1 tablet (100 mg total) by mouth daily.   methocarbamol (ROBAXIN) 750 MG tablet TAKE ONE TABLET BY MOUTH EVERY 8 HOURS AS NEEDED FOR MUSCLE SPASMS.   nitroGLYCERIN (NITROSTAT) 0.4 MG SL tablet Place 1 tablet (0.4 mg total) under the tongue every 5 (five) minutes as needed.   rosuvastatin (CRESTOR) 5 MG tablet Take 1 tablet (5 mg total) by mouth at bedtime. (Needs to be seen before next refill)   No facility-administered encounter medications on file as of 12/03/2019.     Scribe for Treatment Team: [name], LCSW

## 2019-12-10 ENCOUNTER — Other Ambulatory Visit: Payer: Self-pay | Admitting: Family Medicine

## 2019-12-10 ENCOUNTER — Other Ambulatory Visit (HOSPITAL_COMMUNITY): Payer: Self-pay | Admitting: Family Medicine

## 2019-12-10 DIAGNOSIS — Z1231 Encounter for screening mammogram for malignant neoplasm of breast: Secondary | ICD-10-CM

## 2019-12-10 DIAGNOSIS — M5442 Lumbago with sciatica, left side: Secondary | ICD-10-CM

## 2019-12-10 DIAGNOSIS — G8929 Other chronic pain: Secondary | ICD-10-CM

## 2019-12-10 DIAGNOSIS — M542 Cervicalgia: Secondary | ICD-10-CM

## 2019-12-17 ENCOUNTER — Telehealth: Payer: Self-pay | Admitting: Family Medicine

## 2019-12-17 DIAGNOSIS — M5442 Lumbago with sciatica, left side: Secondary | ICD-10-CM

## 2019-12-17 DIAGNOSIS — G8929 Other chronic pain: Secondary | ICD-10-CM

## 2019-12-17 NOTE — Telephone Encounter (Signed)
  Prescription Request  12/17/2019  What is the name of the medication or equipment? Gabapentin RX  Have you contacted your pharmacy to request a refill? (if applicable) Yes  Which pharmacy would you like this sent to? [facility] in [facility]   Patient notified that their request is being sent to the clinical staff for review and that they should receive a response within 2 business days.   [facility] pt.  She was only given 60 capsules & she takes 6 a day, so that is only enough for 10 days.  180 capsules was suppose to be sent in.  Please call pt.

## 2019-12-18 MED ORDER — GABAPENTIN 300 MG PO CAPS: 900.0000 mg | ORAL_CAPSULE | Freq: Two times a day (BID) | ORAL | 0 refills | Status: DC

## 2019-12-18 NOTE — Telephone Encounter (Signed)
Pt aware refill sent to pharmacy 

## 2019-12-24 ENCOUNTER — Ambulatory Visit (HOSPITAL_COMMUNITY)
Admission: RE | Admit: 2019-12-24 | Discharge: 2019-12-24 | Disposition: A | Discharge status: Home / Self Care | Payer: Medicaid Other | Source: Ambulatory Visit | Attending: Family Medicine | Admitting: Family Medicine

## 2019-12-24 ENCOUNTER — Other Ambulatory Visit: Payer: Self-pay

## 2019-12-24 DIAGNOSIS — Z1231 Encounter for screening mammogram for malignant neoplasm of breast: Secondary | ICD-10-CM | POA: Insufficient documentation

## 2020-01-07 ENCOUNTER — Telehealth (INDEPENDENT_AMBULATORY_CARE_PROVIDER_SITE_OTHER): Payer: Medicaid Other | Admitting: Licensed Clinical Social Worker

## 2020-01-07 ENCOUNTER — Telehealth: Payer: Self-pay | Admitting: Licensed Clinical Social Worker

## 2020-01-07 DIAGNOSIS — F331 Major depressive disorder, recurrent, moderate: Secondary | ICD-10-CM

## 2020-01-07 NOTE — Progress Notes (Signed)
Significant improvement in her mood symptoms since the last visit, while PHQ/GAD scores remain relatively high. Will continue to follow monthly.
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
## 2020-01-07 NOTE — BH Specialist Note (Signed)
Virtual Behavioral Health Treatment Plan Team Note  MRN: [id number] NAME: Jamie Mcgee  DATE: 01/13/20  Start time:  1p End time:  110p Total time:  41min  Total number of Virtual [place] Treatment Team Plan encounters: 3/4  Treatment Team Attendees: Dr. Modesta Messing, Psychiatrist; Royal Piedra, LCSW  Diagnoses:    ICD-10-CM   1. Moderate episode of recurrent major depressive disorder (HCC)  F33.1     Goals, Interventions and Follow-up Plan Goals: Increase healthy adjustment to current life circumstances Interventions: Motivational Interviewing Solution-Focused Strategies Brief CBT Medication Management Recommendations: continue medication as prescribed  Follow-up Plan: Continue with monthly follow up  History of the present illness Presenting Problem/Current Symptoms: Patient reports that since Covid 19 began she has experienced worry, on edge symptoms.  She is now taking medication to reduce intensity.  Currently able to go in the community with minimal anxiousness  Psychiatric History  Depression: No Anxiety: No Mania: No Psychosis: No PTSD symptoms: No  Past Psychiatric History/Hospitalization(s): Hospitalization for psychiatric illness: No Prior Suicide Attempts: No Prior Self-injurious behavior: No  Psychosocial stressors   Virtual BH Phone Follow Up from 07/16/2019 in [place]  Current Stressors Other (Comment)  [CoVid 19]  Familial Stressors None  Sleep Decreased  Appetite No problems  Coping ability Overwhelmed  Patient taking medications as prescribed Yes      Self-harm Behaviors Risk Assessment   Virtual [place] Phone Follow Up from 07/16/2019 in [place]  Have you recently had any thoughts about harming yourself? No      Screenings PHQ-9 Assessments:  Depression screen [facility] 2/9 01/09/2020 01/07/2020 12/03/2019  Decreased Interest 1 1 1   Down, Depressed, Hopeless 0 2 2  PHQ - 2 Score 1 3 3   Altered sleeping 2  1 1   Tired, decreased energy 1 1 1   Change in appetite 0 2 2  Feeling bad or failure about yourself  0 1 2  Trouble concentrating 1 1 1   Moving slowly or fidgety/restless 0 1 1  Suicidal thoughts 0 0 0  PHQ-9 Score 5 10 11   Difficult doing work/chores Not difficult at all Somewhat difficult Not difficult at all  Some recent data might be hidden   GAD-7 Assessments:  GAD 7 : Generalized Anxiety Score 01/09/2020 01/07/2020 12/03/2019 10/29/2019  Nervous, Anxious, on Edge 0 1 1 2   Control/stop worrying 0 1 2 1   Worry too much - different things 0 1 1 1   Trouble relaxing 1 1 1 2   Restless 1 1 1 2   Easily annoyed or irritable 0 1 2 1   Afraid - awful might happen 0 1 1 1   Total GAD 7 Score 2 7 9 10   Anxiety Difficulty Not difficult at all Somewhat difficult Somewhat difficult -    Past Medical History Past Medical History:  Diagnosis Date  . Carpal tunnel syndrome on right   . Cervical stenosis of spine    C5-6  . COPD (chronic obstructive pulmonary disease) ([place])    Gold III  . Depression   . Fibromuscular dysplasia of renal artery ([place]) 2006  . GERD (gastroesophageal reflux disease)   . Hepatitis    type A in 1977  . HNP (herniated nucleus pulposus), cervical   . HTN (hypertension)   . Neuropathy     Vital signs: There were no vitals filed for this visit.  Allergies:  Allergies as of 01/07/2020 - Review Complete 11/19/2019  Allergen Reaction Noted  . Lipitor [atorvastatin] Other (See  Comments) 10/13/2014  . Naproxen Nausea And Vomiting 06/04/2015    Medication History Current medications:  Outpatient Encounter Medications as of 01/07/2020  Medication Sig  . diclofenac sodium (VOLTAREN) 1 % GEL Apply 4 g topically 4 (four) times daily.  . hydrOXYzine (ATARAX/VISTARIL) 50 MG tablet TAKE 1/2 TO 1 TABLET BY MOUTH THREE TIMES DAILY AS NEEDED FOR ANXIETY (SLEEP)  . methocarbamol (ROBAXIN) 750 MG tablet TAKE ONE TABLET BY MOUTH EVERY 8 HOURS AS NEEDED FOR MUSCLE SPASMS  .  nitroGLYCERIN (NITROSTAT) 0.4 MG SL tablet Place 1 tablet (0.4 mg total) under the tongue every 5 (five) minutes as needed.  . [DISCONTINUED] amoxicillin-clavulanate (AUGMENTIN) 875-125 MG tablet Take 1 tablet by mouth 2 (two) times daily. (Patient not taking: Reported on 01/09/2020)  . [DISCONTINUED] busPIRone (BUSPAR) 10 MG tablet Take 1 tablet (10 mg total) by mouth 3 (three) times daily. Needs to be seen for future refills.  . [DISCONTINUED] carvedilol (COREG) 6.25 MG tablet Take 1 tablet (6.25 mg total) by mouth 2 (two) times daily with a meal. (Needs to be seen before next refill)  . [DISCONTINUED] citalopram (CELEXA) 40 MG tablet Take 1 tablet (40 mg total) by mouth daily. (Needs to be seen before next refill)  . [DISCONTINUED] gabapentin (NEURONTIN) 300 MG capsule Take 3 capsules (900 mg total) by mouth 2 (two) times daily.  . [DISCONTINUED] hydrochlorothiazide (HYDRODIURIL) 25 MG tablet Take 1 tablet (25 mg total) by mouth daily. (Needs to be seen before next refill)  . [DISCONTINUED] losartan (COZAAR) 100 MG tablet Take 1 tablet (100 mg total) by mouth daily.  . [DISCONTINUED] rosuvastatin (CRESTOR) 5 MG tablet Take 1 tablet (5 mg total) by mouth at bedtime. (Needs to be seen before next refill)   No facility-administered encounter medications on file as of 01/07/2020.     Scribe for Treatment Team: [name], LCSW

## 2020-01-09 ENCOUNTER — Encounter: Payer: Self-pay | Admitting: Family Medicine

## 2020-01-09 ENCOUNTER — Ambulatory Visit (INDEPENDENT_AMBULATORY_CARE_PROVIDER_SITE_OTHER): Payer: Medicaid Other | Admitting: Family Medicine

## 2020-01-09 ENCOUNTER — Other Ambulatory Visit: Payer: Self-pay

## 2020-01-09 VITALS — BP 119/76 | HR 69 | Temp 97.3°F | Ht 64.0 in | Wt 196.0 lb

## 2020-01-09 DIAGNOSIS — F331 Major depressive disorder, recurrent, moderate: Secondary | ICD-10-CM

## 2020-01-09 DIAGNOSIS — I10 Essential (primary) hypertension: Secondary | ICD-10-CM

## 2020-01-09 DIAGNOSIS — H10022 Other mucopurulent conjunctivitis, left eye: Secondary | ICD-10-CM

## 2020-01-09 DIAGNOSIS — E7800 Pure hypercholesterolemia, unspecified: Secondary | ICD-10-CM

## 2020-01-09 DIAGNOSIS — F4323 Adjustment disorder with mixed anxiety and depressed mood: Secondary | ICD-10-CM | POA: Diagnosis not present

## 2020-01-09 DIAGNOSIS — G8929 Other chronic pain: Secondary | ICD-10-CM

## 2020-01-09 DIAGNOSIS — M5442 Lumbago with sciatica, left side: Secondary | ICD-10-CM

## 2020-01-09 DIAGNOSIS — M5441 Lumbago with sciatica, right side: Secondary | ICD-10-CM

## 2020-01-09 MED ORDER — CARVEDILOL 6.25 MG PO TABS: 6.2500 mg | ORAL_TABLET | Freq: Two times a day (BID) | ORAL | 5 refills | Status: DC

## 2020-01-09 MED ORDER — ROSUVASTATIN CALCIUM 5 MG PO TABS: 5.0000 mg | ORAL_TABLET | Freq: Every day | ORAL | 5 refills | Status: DC

## 2020-01-09 MED ORDER — BUSPIRONE HCL 10 MG PO TABS: 10.0000 mg | ORAL_TABLET | Freq: Three times a day (TID) | ORAL | 5 refills | Status: DC

## 2020-01-09 MED ORDER — HYDROCHLOROTHIAZIDE 25 MG PO TABS: 25.0000 mg | ORAL_TABLET | Freq: Every day | ORAL | 5 refills | Status: DC

## 2020-01-09 MED ORDER — CITALOPRAM HYDROBROMIDE 40 MG PO TABS: 40.0000 mg | ORAL_TABLET | Freq: Every day | ORAL | 5 refills | Status: DC

## 2020-01-09 MED ORDER — PREGABALIN 150 MG PO CAPS: 150.0000 mg | ORAL_CAPSULE | Freq: Two times a day (BID) | ORAL | 2 refills | Status: DC

## 2020-01-09 MED ORDER — LOSARTAN POTASSIUM 100 MG PO TABS: 100.0000 mg | ORAL_TABLET | Freq: Every day | ORAL | 5 refills | Status: DC

## 2020-01-09 MED ORDER — POLYMYXIN B-TRIMETHOPRIM 10000-0.1 UNIT/ML-% OP SOLN: 1.0000 [drp] | Freq: Four times a day (QID) | OPHTHALMIC | 0 refills | Status: AC

## 2020-01-09 NOTE — Progress Notes (Signed)
Subjective: CC: Follow-up hypertension, hyperlipidemia, anxiety/depression, chronic low back pain PCP: [name], DO ZOX:WRUEAVW Jamie Mcgee is a 59 y.o. female presenting to clinic today for:  1.  Hypertension with hyperlipidemia Patient reports compliance with her medications.  Does not report chest pain, shortness of breath, edema  2.  Anxiety depression Patient reports stability of both anxiety and depression.  She is compliant with Celexa, BuSpar and as needed Atarax for sleep.  3.  Chronic low back pain Patient with ongoing chronic lower back pain.  She takes methocarbamol typically just twice daily, she is also taking gabapentin 900 mg twice daily, using topical Voltaren gel.  She continues to have quite a bit of pain, particularly in the legs.  She describes this as radicular pain.  She was told that she "has arthritis in her back" and that injections would be unlikely to be of benefit.  She has history of back surgery.  Last visit with orthopedist was in February.  4. [place] Patient reports that she started having redness in her left eye a few days ago. Is been running a lot. It is slightly burning in sensation. Denies any foreign body or injury. She did have some matted discharge on Tuesday. Symptoms started on Monday.  No pain with eye movement, blurred vision, fevers or headache.  No treatments thus far.  ROS: Per HPI  Allergies  Allergen Reactions  . Lipitor [Atorvastatin] Other (See Comments)    Muscle aches and nausea  . Naproxen Nausea And Vomiting    Stomach pain   Past Medical History:  Diagnosis Date  . Carpal tunnel syndrome on right   . Cervical stenosis of spine    C5-6  . COPD (chronic obstructive pulmonary disease) ([place])    Gold III  . Depression   . Fibromuscular dysplasia of renal artery ([place]) 2006  . GERD (gastroesophageal reflux disease)   . Hepatitis    type A in 1977  . HNP (herniated nucleus pulposus), cervical   . HTN (hypertension)     . Neuropathy     Current Outpatient Medications:  .  busPIRone (BUSPAR) 10 MG tablet, Take 1 tablet (10 mg total) by mouth 3 (three) times daily. Needs to be seen for future refills., Disp: 90 tablet, Rfl: 0 .  carvedilol (COREG) 6.25 MG tablet, Take 1 tablet (6.25 mg total) by mouth 2 (two) times daily with a meal. (Needs to be seen before next refill), Disp: 60 tablet, Rfl: 0 .  citalopram (CELEXA) 40 MG tablet, Take 1 tablet (40 mg total) by mouth daily. (Needs to be seen before next refill), Disp: 30 tablet, Rfl: 0 .  diclofenac sodium (VOLTAREN) 1 % GEL, Apply 4 g topically 4 (four) times daily., Disp: 400 g, Rfl: 3 .  gabapentin (NEURONTIN) 300 MG capsule, Take 3 capsules (900 mg total) by mouth 2 (two) times daily., Disp: 180 capsule, Rfl: 0 .  hydrochlorothiazide (HYDRODIURIL) 25 MG tablet, Take 1 tablet (25 mg total) by mouth daily. (Needs to be seen before next refill), Disp: 30 tablet, Rfl: 0 .  hydrOXYzine (ATARAX/VISTARIL) 50 MG tablet, TAKE 1/2 TO 1 TABLET BY MOUTH THREE TIMES DAILY AS NEEDED FOR ANXIETY (SLEEP), Disp: 90 tablet, Rfl: 0 .  losartan (COZAAR) 100 MG tablet, Take 1 tablet (100 mg total) by mouth daily., Disp: 30 tablet, Rfl: 5 .  methocarbamol (ROBAXIN) 750 MG tablet, TAKE ONE TABLET BY MOUTH EVERY 8 HOURS AS NEEDED FOR MUSCLE SPASMS, Disp: 90 tablet, Rfl: 2 .  nitroGLYCERIN (NITROSTAT) 0.4 MG SL tablet, Place 1 tablet (0.4 mg total) under the tongue every 5 (five) minutes as needed., Disp: 25 tablet, Rfl: 3 .  rosuvastatin (CRESTOR) 5 MG tablet, Take 1 tablet (5 mg total) by mouth at bedtime. (Needs to be seen before next refill), Disp: 30 tablet, Rfl: 0 .  amoxicillin-clavulanate (AUGMENTIN) 875-125 MG tablet, Take 1 tablet by mouth 2 (two) times daily. (Patient not taking: Reported on 01/09/2020), Disp: 14 tablet, Rfl: 0 Social History   Socioeconomic History  . Marital status: Single    Spouse name: Not on file  . Number of children: Not on file  . Years of  education: Not on file  . Highest education level: Not on file  Occupational History  . Not on file  Tobacco Use  . Smoking status: Current Every Day Smoker    Packs/day: 1.00    Years: 40.00    Pack years: 40.00    Types: Cigarettes  . Smokeless tobacco: Never Used  . Tobacco comment: pt quit smoking 2016 for 7 months but restarted last year  Vaping Use  . Vaping Use: Never used  Substance and Sexual Activity  . Alcohol use: No    Alcohol/week: 0.0 standard drinks  . Drug use: Yes    Frequency: 0.5 times per week    Types: Marijuana    Comment: weekly  . Sexual activity: Yes    Birth control/protection: None, Post-menopausal  Other Topics Concern  . Not on file  Social History Narrative   The patient does not have any sex drive and has not had one for almost a year.   Her   husband thinks she does not love him anymore.  He does not seem to understand   her   depression.   Social Determinants of Health   Financial Resource Strain:   . Difficulty of Paying Living Expenses:   Food Insecurity:   . Worried About Charity fundraiser in the Last Year:   . Arboriculturist in the Last Year:   Transportation Needs:   . Film/video editor (Medical):   Marland Kitchen Lack of Transportation (Non-Medical):   Physical Activity:   . Days of Exercise per Week:   . Minutes of Exercise per Session:   Stress:   . Feeling of Stress :   Social Connections:   . Frequency of Communication with Friends and Family:   . Frequency of Social Gatherings with Friends and Family:   . Attends Religious Services:   . Active Member of Clubs or Organizations:   . Attends Archivist Meetings:   Marland Kitchen Marital Status:   Intimate Partner Violence:   . Fear of Current or Ex-Partner:   . Emotionally Abused:   Marland Kitchen Physically Abused:   . Sexually Abused:    Family History  Problem Relation Age of Onset  . Coronary artery disease Father        States congenital abnormality  . Diabetes Father   .  Hyperlipidemia Father   . CAD Sister        Stents   . Cancer Sister        breast  . Cancer Mother        breast  . Cancer Maternal Aunt        breast  . Cancer Maternal Grandmother        breast  . Anesthesia problems Neg Hx   . Hypotension Neg Hx   . Malignant hyperthermia  Neg Hx   . Pseudochol deficiency Neg Hx     Objective: Office vital signs reviewed. BP 119/76   Pulse 69   Temp (!) 97.3 F (36.3 C)   Ht 5' 4"  (1.626 m)   Wt 196 lb (88.9 kg)   LMP 05/25/2011   SpO2 97%   BMI 33.64 kg/m   Physical Examination:  General: Awake, alert, well nourished, No acute distress HEENT: Minimally injected left sclera.  PERRLA.  EOMI.  No pain with EOM. Cardio: regular rate and rhythm, S1S2 heard, no murmurs appreciated Pulm: clear to auscultation bilaterally, no wheezes, rhonchi or rales; normal work of breathing on room air Extremities: warm, well perfused, No edema, cyanosis or clubbing; +2 pulses bilaterally MSK: slow gait   Assessment/ Plan: 59 y.o. female   1. Pink eye disease of left eye Empiric treatment with Polytrim.  Home care instructions reviewed.  Reasons for return discussed. - trimethoprim-polymyxin b (POLYTRIM) ophthalmic solution; Place 1 drop into the left eye every 6 (six) hours for 7 days.  Dispense: 10 mL; Refill: 0 - CBC  2. Situational mixed anxiety and depressive disorder Stable - busPIRone (BUSPAR) 10 MG tablet; Take 1 tablet (10 mg total) by mouth 3 (three) times daily.  Dispense: 90 tablet; Refill: 5 - citalopram (CELEXA) 40 MG tablet; Take 1 tablet (40 mg total) by mouth daily.  Dispense: 30 tablet; Refill: 5  3. Essential hypertension, benign Controlled - carvedilol (COREG) 6.25 MG tablet; Take 1 tablet (6.25 mg total) by mouth 2 (two) times daily with a meal.  Dispense: 60 tablet; Refill: 5 - hydrochlorothiazide (HYDRODIURIL) 25 MG tablet; Take 1 tablet (25 mg total) by mouth daily.  Dispense: 30 tablet; Refill: 5 - losartan (COZAAR) 100  MG tablet; Take 1 tablet (100 mg total) by mouth daily.  Dispense: 30 tablet; Refill: 5 - CMP14+EGFR  4. Moderate episode of recurrent major depressive disorder (HCC) Stable - citalopram (CELEXA) 40 MG tablet; Take 1 tablet (40 mg total) by mouth daily.  Dispense: 30 tablet; Refill: 5  5. Pure hypercholesterolemia Check lipid panel.  Continue Crestor - rosuvastatin (CRESTOR) 5 MG tablet; Take 1 tablet (5 mg total) by mouth at bedtime.  Dispense: 30 tablet; Refill: 5 - CMP14+EGFR - Lipid panel - TSH  6. Chronic bilateral low back pain with bilateral sciatica Trial of Lyrica.  I have prescribed equivalent dose of gabapentin to equal Lyrica.  She will follow-up with me in the next 4 weeks for recheck, sooner if needed.  The national narcotic database was reviewed there are no red flags. - pregabalin (LYRICA) 150 MG capsule; Take 1 capsule (150 mg total) by mouth 2 (two) times daily.  Dispense: 60 capsule; Refill: 2   No orders of the defined types were placed in this encounter.  No orders of the defined types were placed in this encounter.    [name], DO [place] [phone]

## 2020-01-09 NOTE — Patient Instructions (Signed)
STOP gabapentin.  START Lyrica tomorrow.

## 2020-01-10 LAB — LIPID PANEL
Chol/HDL Ratio: 2.3 ratio (ref 0.0–4.4)
Cholesterol, Total: 111 mg/dL (ref 100–199)
HDL: 48 mg/dL (ref >39)
LDL Chol Calc (NIH): 46 mg/dL (ref 0–99)
Triglycerides: 86 mg/dL (ref 0–149)
VLDL Cholesterol Cal: 17 mg/dL (ref 5–40)

## 2020-01-10 LAB — CMP14+EGFR
ALT: 20 IU/L (ref 0–32)
AST: 35 IU/L (ref 0–40)
Albumin/Globulin Ratio: 1.1 — ABNORMAL LOW (ref 1.2–2.2)
Albumin: 3.9 g/dL (ref 3.8–4.9)
Alkaline Phosphatase: 107 IU/L (ref 48–121)
BUN/Creatinine Ratio: 10 (ref 9–23)
BUN: 8 mg/dL (ref 6–24)
Bilirubin Total: 0.5 mg/dL (ref 0.0–1.2)
CO2: 28 mmol/L (ref 20–29)
Calcium: 9.1 mg/dL (ref 8.7–10.2)
Chloride: 98 mmol/L (ref 96–106)
Creatinine, Ser: 0.82 mg/dL (ref 0.57–1.00)
GFR calc Af Amer: 91 mL/min/{1.73_m2} (ref >59)
GFR calc non Af Amer: 79 mL/min/{1.73_m2} (ref >59)
Globulin, Total: 3.4 g/dL (ref 1.5–4.5)
Glucose: 104 mg/dL — ABNORMAL HIGH (ref 65–99)
Potassium: 3.7 mmol/L (ref 3.5–5.2)
Sodium: 138 mmol/L (ref 134–144)
Total Protein: 7.3 g/dL (ref 6.0–8.5)

## 2020-01-10 LAB — CBC
Hematocrit: 43.3 % (ref 34.0–46.6)
Hemoglobin: 14.8 g/dL (ref 11.1–15.9)
MCH: 30.2 pg (ref 26.6–33.0)
MCHC: 34.2 g/dL (ref 31.5–35.7)
MCV: 88 fL (ref 79–97)
Platelets: 214 10*3/uL (ref 150–450)
RBC: 4.9 x10E6/uL (ref 3.77–5.28)
RDW: 14.3 % (ref 11.7–15.4)
WBC: 8.5 10*3/uL (ref 3.4–10.8)

## 2020-01-10 LAB — TSH: TSH: 1.62 u[IU]/mL (ref 0.450–4.500)

## 2020-02-09 NOTE — Progress Notes (Signed)
Subjective: CC: Follow-up chronic low back pain PCP: [name], DO DVV:OHYWVPX Jamie Mcgee is a 59 y.o. female presenting to clinic today for:  1.  Chronic low back pain Gabapentin was switched to equivalent dose of Lyrica last visit to see if perhaps this would aid in her ongoing chronic low back pain.  She continues to take methocarbamol twice daily, continues with topical Voltaren gel.  Pain at last visit was described as radicular.  She does report the daytime symptoms seem to be a lot better but nighttime symptoms are fairly unchanged.  She still has burning and pain in her legs at nighttime.  She denies any excessive daytime sedation with medication.  No falls.  2.  COPD Patient reports uncontrolled COPD.  She gets easily dyspneic with use of her mask and ambulation.  She was previously well controlled on Dulera but she did not have insurance at the time and so she discontinued.  She continues to smoke.  Denies any hemoptysis, unplanned weight loss or night sweats.  Would like to resume use of Dulera if possible.   ROS: Per HPI  Allergies  Allergen Reactions   Lipitor [Atorvastatin] Other (See Comments)    Muscle aches and nausea   Naproxen Nausea And Vomiting    Stomach pain   Past Medical History:  Diagnosis Date   Carpal tunnel syndrome on right    Cervical stenosis of spine    C5-6   COPD (chronic obstructive pulmonary disease) (HCC)    Gold III   Depression    Fibromuscular dysplasia of renal artery ([place]) 2006   GERD (gastroesophageal reflux disease)    Hepatitis    type A in 1977   HNP (herniated nucleus pulposus), cervical    HTN (hypertension)    Neuropathy     Current Outpatient Medications:    busPIRone (BUSPAR) 10 MG tablet, Take 1 tablet (10 mg total) by mouth 3 (three) times daily., Disp: 90 tablet, Rfl: 5   carvedilol (COREG) 6.25 MG tablet, Take 1 tablet (6.25 mg total) by mouth 2 (two) times daily with a meal., Disp: 60 tablet,  Rfl: 5   citalopram (CELEXA) 40 MG tablet, Take 1 tablet (40 mg total) by mouth daily., Disp: 30 tablet, Rfl: 5   diclofenac sodium (VOLTAREN) 1 % GEL, Apply 4 g topically 4 (four) times daily., Disp: 400 g, Rfl: 3   hydrochlorothiazide (HYDRODIURIL) 25 MG tablet, Take 1 tablet (25 mg total) by mouth daily., Disp: 30 tablet, Rfl: 5   hydrOXYzine (ATARAX/VISTARIL) 50 MG tablet, TAKE 1/2 TO 1 TABLET BY MOUTH THREE TIMES DAILY AS NEEDED FOR ANXIETY (SLEEP), Disp: 90 tablet, Rfl: 0   losartan (COZAAR) 100 MG tablet, Take 1 tablet (100 mg total) by mouth daily., Disp: 30 tablet, Rfl: 5   methocarbamol (ROBAXIN) 750 MG tablet, TAKE ONE TABLET BY MOUTH EVERY 8 HOURS AS NEEDED FOR MUSCLE SPASMS, Disp: 90 tablet, Rfl: 2   nitroGLYCERIN (NITROSTAT) 0.4 MG SL tablet, Place 1 tablet (0.4 mg total) under the tongue every 5 (five) minutes as needed., Disp: 25 tablet, Rfl: 3   pregabalin (LYRICA) 150 MG capsule, Take 1 capsule (150 mg total) by mouth 2 (two) times daily., Disp: 60 capsule, Rfl: 2   rosuvastatin (CRESTOR) 5 MG tablet, Take 1 tablet (5 mg total) by mouth at bedtime., Disp: 30 tablet, Rfl: 5 Social History   Socioeconomic History   Marital status: Single    Spouse name: Not on file   Number  of children: Not on file   Years of education: Not on file   Highest education level: Not on file  Occupational History   Not on file  Tobacco Use   Smoking status: Current Every Day Smoker    Packs/day: 1.00    Years: 40.00    Pack years: 40.00    Types: Cigarettes   Smokeless tobacco: Never Used   Tobacco comment: pt quit smoking 2016 for 7 months but restarted last year  Vaping Use   Vaping Use: Never used  Substance and Sexual Activity   Alcohol use: No    Alcohol/week: 0.0 standard drinks   Drug use: Yes    Frequency: 0.5 times per week    Types: Marijuana    Comment: weekly   Sexual activity: Yes    Birth control/protection: None, Post-menopausal  Other Topics  Concern   Not on file  Social History Narrative   The patient does not have any sex drive and has not had one for almost a year.   Her   husband thinks she does not love him anymore.  He does not seem to understand   her   depression.   Social Determinants of Health   Financial Resource Strain:    Difficulty of Paying Living Expenses: Not on file  Food Insecurity:    Worried About Charity fundraiser in the Last Year: Not on file   [employer] of Food in the Last Year: Not on file  Transportation Needs:    Lack of Transportation (Medical): Not on file   Lack of Transportation (Non-Medical): Not on file  Physical Activity:    Days of Exercise per Week: Not on file   Minutes of Exercise per Session: Not on file  Stress:    Feeling of Stress : Not on file  Social Connections:    Frequency of Communication with Friends and Family: Not on file   Frequency of Social Gatherings with Friends and Family: Not on file   Attends Religious Services: Not on file   Active Member of Clubs or Organizations: Not on file   Attends Archivist Meetings: Not on file   Marital Status: Not on file  Intimate Partner Violence:    Fear of Current or Ex-Partner: Not on file   Emotionally Abused: Not on file   Physically Abused: Not on file   Sexually Abused: Not on file   Family History  Problem Relation Age of Onset   Coronary artery disease Father        States congenital abnormality   Diabetes Father    Hyperlipidemia Father    CAD Sister        Stents    Cancer Sister        breast   Cancer Mother        breast   Cancer Maternal Aunt        breast   Cancer Maternal Grandmother        breast   Anesthesia problems Neg Hx    Hypotension Neg Hx    Malignant hyperthermia Neg Hx    Pseudochol deficiency Neg Hx     Objective: Office vital signs reviewed. BP 107/69    Pulse 68    Temp 97.7 F (36.5 C) (Temporal)    Ht 5\' 4"  (1.626 m)    Wt 198 lb  (89.8 kg)    LMP 05/25/2011    SpO2 96%    BMI 33.99 kg/m  Physical Examination:  General: Awake, alert, well nourished, No acute distress HEENT: sclera white, MMM Cardio: regular rate and rhythm, S1S2 heard, no murmurs appreciated Pulm: Mild expiratory wheezes noted at the base bilaterally.  Normal work of breathing on room air. Extremities: warm, well perfused, No edema, cyanosis or clubbing; +2 pulses bilaterally MSK: slow gait; ambulating independently  Assessment/ Plan: 59 y.o. female   1. Chronic bilateral low back pain with bilateral sciatica We will change dose to 150 mg every morning and 300 mg q. evening.  Is the max daily dose for her symptoms.  I would like to see her back in 1 month for recheck.  At that time plan for UDS and CSC as per office policy. The Narcotic Database has been reviewed.  There were no red flags.   - pregabalin (LYRICA) 150 MG capsule; Take 150mg  each morning and 300mg  each evening. Pharmacy: Cancel previous rx  Dispense: 90 capsule; Refill: 2  2. COPD  GOLD 0 / still smoking  Dulera sent.  She will follow-up in 1 month for recheck - mometasone-formoterol (DULERA) 100-5 MCG/ACT AERO; Inhale 2 puffs into the lungs in the morning and at bedtime.  Dispense: 1 each; Refill: 12    No orders of the defined types were placed in this encounter.  No orders of the defined types were placed in this encounter.    [name], DO [place] [phone]

## 2020-02-10 ENCOUNTER — Ambulatory Visit: Payer: Medicaid Other | Admitting: Family Medicine

## 2020-02-10 ENCOUNTER — Encounter: Payer: Self-pay | Admitting: Family Medicine

## 2020-02-10 ENCOUNTER — Other Ambulatory Visit: Payer: Self-pay

## 2020-02-10 VITALS — BP 107/69 | HR 68 | Temp 97.7°F | Ht 64.0 in | Wt 198.0 lb

## 2020-02-10 DIAGNOSIS — M5441 Lumbago with sciatica, right side: Secondary | ICD-10-CM

## 2020-02-10 DIAGNOSIS — G8929 Other chronic pain: Secondary | ICD-10-CM

## 2020-02-10 DIAGNOSIS — J449 Chronic obstructive pulmonary disease, unspecified: Secondary | ICD-10-CM

## 2020-02-10 DIAGNOSIS — M5442 Lumbago with sciatica, left side: Secondary | ICD-10-CM

## 2020-02-10 MED ORDER — DULERA 100-5 MCG/ACT IN AERO: 2.0000 | INHALATION_SPRAY | Freq: Two times a day (BID) | RESPIRATORY_TRACT | 12 refills | Status: DC

## 2020-02-10 MED ORDER — PREGABALIN 150 MG PO CAPS: ORAL_CAPSULE | ORAL | 2 refills | Status: DC

## 2020-02-10 NOTE — Patient Instructions (Signed)
  Controlled Substance Guidelines:  1. You cannot get an early refill, even it is lost.  2. You cannot get controlled medications from any other doctor, unless it is the emergency department and related to a new problem or injury.  3. You cannot use alcohol, marijuana, cocaine or any other recreational drugs while using this medication. This is very dangerous.  4. You are willing to have your urine drug tested at each visit.  5. You will not drive while using this medication, because that can put yourself and others in serious danger of an accident. 6. If any medication is stolen, then there must be a police report to verify it, or it cannot be refilled.  7. I will not prescribe these medications for longer than 3 months.  8. You must bring your pill bottle to each visit.  9. You must use the same pharmacy for all refills for the medication, unless you clear it with me beforehand.  10. You cannot share or sell this medication.   

## 2020-03-10 ENCOUNTER — Ambulatory Visit (INDEPENDENT_AMBULATORY_CARE_PROVIDER_SITE_OTHER): Payer: Medicaid Other | Admitting: Family Medicine

## 2020-03-10 ENCOUNTER — Other Ambulatory Visit: Payer: Self-pay

## 2020-03-10 ENCOUNTER — Encounter: Payer: Self-pay | Admitting: Family Medicine

## 2020-03-10 VITALS — BP 129/71 | HR 70 | Temp 97.8°F | Wt 194.0 lb

## 2020-03-10 DIAGNOSIS — M5442 Lumbago with sciatica, left side: Secondary | ICD-10-CM

## 2020-03-10 DIAGNOSIS — Z23 Encounter for immunization: Secondary | ICD-10-CM | POA: Diagnosis not present

## 2020-03-10 DIAGNOSIS — G8929 Other chronic pain: Secondary | ICD-10-CM

## 2020-03-10 DIAGNOSIS — M5441 Lumbago with sciatica, right side: Secondary | ICD-10-CM | POA: Diagnosis not present

## 2020-03-10 NOTE — Progress Notes (Signed)
Subjective: CC: Follow-up chronic low back pain PCP: [name], DO HYI:FOYDXAJ K Jamie Mcgee is a 59 y.o. female presenting to clinic today for:  1.  Chronic low back pain History: Previously treated with gabapentin  She continues take Lyrica 150 twice daily.  She unfortunately never got the prescription that was sent in last month with the increased evening dose to 300 mg.  She is not having any adverse side effects from the Lyrica.  No visual auditory hallucinations.  No excessive sedation.  Back pain is stable.  She continues to have some discomfort with activities  ROS: Per HPI  Allergies  Allergen Reactions  . Lipitor [Atorvastatin] Other (See Comments)    Muscle aches and nausea  . Naproxen Nausea And Vomiting    Stomach pain   Past Medical History:  Diagnosis Date  . Carpal tunnel syndrome on right   . Cervical stenosis of spine    C5-6  . COPD (chronic obstructive pulmonary disease) ([place])    Gold III  . Depression   . Fibromuscular dysplasia of renal artery ([place]) 2006  . GERD (gastroesophageal reflux disease)   . Hepatitis    type A in 1977  . HNP (herniated nucleus pulposus), cervical   . HTN (hypertension)   . Neuropathy     Current Outpatient Medications:  .  busPIRone (BUSPAR) 10 MG tablet, Take 1 tablet (10 mg total) by mouth 3 (three) times daily., Disp: 90 tablet, Rfl: 5 .  carvedilol (COREG) 6.25 MG tablet, Take 1 tablet (6.25 mg total) by mouth 2 (two) times daily with a meal., Disp: 60 tablet, Rfl: 5 .  citalopram (CELEXA) 40 MG tablet, Take 1 tablet (40 mg total) by mouth daily., Disp: 30 tablet, Rfl: 5 .  diclofenac sodium (VOLTAREN) 1 % GEL, Apply 4 g topically 4 (four) times daily., Disp: 400 g, Rfl: 3 .  hydrochlorothiazide (HYDRODIURIL) 25 MG tablet, Take 1 tablet (25 mg total) by mouth daily., Disp: 30 tablet, Rfl: 5 .  hydrOXYzine (ATARAX/VISTARIL) 50 MG tablet, TAKE 1/2 TO 1 TABLET BY MOUTH THREE TIMES DAILY AS NEEDED FOR ANXIETY (SLEEP),  Disp: 90 tablet, Rfl: 0 .  losartan (COZAAR) 100 MG tablet, Take 1 tablet (100 mg total) by mouth daily., Disp: 30 tablet, Rfl: 5 .  methocarbamol (ROBAXIN) 750 MG tablet, TAKE ONE TABLET BY MOUTH EVERY 8 HOURS AS NEEDED FOR MUSCLE SPASMS, Disp: 90 tablet, Rfl: 2 .  mometasone-formoterol (DULERA) 100-5 MCG/ACT AERO, Inhale 2 puffs into the lungs in the morning and at bedtime., Disp: 1 each, Rfl: 12 .  nitroGLYCERIN (NITROSTAT) 0.4 MG SL tablet, Place 1 tablet (0.4 mg total) under the tongue every 5 (five) minutes as needed., Disp: 25 tablet, Rfl: 3 .  pregabalin (LYRICA) 150 MG capsule, Take 150mg  each morning and 300mg  each evening. Pharmacy: Cancel previous rx, Disp: 90 capsule, Rfl: 2 .  rosuvastatin (CRESTOR) 5 MG tablet, Take 1 tablet (5 mg total) by mouth at bedtime., Disp: 30 tablet, Rfl: 5 Social History   Socioeconomic History  . Marital status: Single    Spouse name: Not on file  . Number of children: Not on file  . Years of education: Not on file  . Highest education level: Not on file  Occupational History  . Not on file  Tobacco Use  . Smoking status: Current Every Day Smoker    Packs/day: 1.00    Years: 40.00    Pack years: 40.00    Types: Cigarettes  .  Smokeless tobacco: Never Used  . Tobacco comment: pt quit smoking 2016 for 7 months but restarted last year  Vaping Use  . Vaping Use: Never used  Substance and Sexual Activity  . Alcohol use: No    Alcohol/week: 0.0 standard drinks  . Drug use: Yes    Frequency: 0.5 times per week    Types: Marijuana    Comment: weekly  . Sexual activity: Yes    Birth control/protection: None, Post-menopausal  Other Topics Concern  . Not on file  Social History Narrative   The patient does not have any sex drive and has not had one for almost a year.   Her   husband thinks she does not love him anymore.  He does not seem to understand   her   depression.   Social Determinants of Health   Financial Resource Strain:   .  Difficulty of Paying Living Expenses: Not on file  Food Insecurity:   . Worried About Charity fundraiser in the Last Year: Not on file  . Ran Out of Food in the Last Year: Not on file  Transportation Needs:   . Lack of Transportation (Medical): Not on file  . Lack of Transportation (Non-Medical): Not on file  Physical Activity:   . Days of Exercise per Week: Not on file  . Minutes of Exercise per Session: Not on file  Stress:   . Feeling of Stress : Not on file  Social Connections:   . Frequency of Communication with Friends and Family: Not on file  . Frequency of Social Gatherings with Friends and Family: Not on file  . Attends Religious Services: Not on file  . Active Member of Clubs or Organizations: Not on file  . Attends Archivist Meetings: Not on file  . Marital Status: Not on file  Intimate Partner Violence:   . Fear of Current or Ex-Partner: Not on file  . Emotionally Abused: Not on file  . Physically Abused: Not on file  . Sexually Abused: Not on file   Family History  Problem Relation Age of Onset  . Coronary artery disease Father        States congenital abnormality  . Diabetes Father   . Hyperlipidemia Father   . CAD Sister        Stents   . Cancer Sister        breast  . Cancer Mother        breast  . Cancer Maternal Aunt        breast  . Cancer Maternal Grandmother        breast  . Anesthesia problems Neg Hx   . Hypotension Neg Hx   . Malignant hyperthermia Neg Hx   . Pseudochol deficiency Neg Hx     Objective: Office vital signs reviewed. BP 129/71   Pulse 70   Temp 97.8 F (36.6 C)   Wt 194 lb (88 kg)   LMP 05/25/2011   SpO2 96%   BMI 33.30 kg/m   Physical Examination:  General: Awake, alert, well nourished, No acute distress HEENT: sclera white, MMM Cardio: regular rate and rhythm, S1S2 heard, no murmurs appreciated Pulm: Mild expiratory wheezes noted at the base bilaterally.  Normal work of breathing on room  air. Extremities: warm, well perfused, No edema, cyanosis or clubbing; +2 pulses bilaterally MSK: Ambulates independently.  Gait is slow but she has normal tone.  Assessment/ Plan: 59 y.o. female   1. Chronic bilateral low  back pain with bilateral sciatica Never increase the dose as recommended because the pharmacy did not fill the new prescription.  She instead got her old prescription with the twice daily dosing.  I called the pharmacist today and confirmed receipt of the prescription that I sent last month and they confirmed that this is out for delivery.  She has follow-up with me in the next month or so so we will again revisit how this new dosing schedule is working.  In interim, UDS and CSC were obtained as per office policy. - ToxASSURE Select 13 (MW), Urine  2. Need for immunization against influenza Administered - Flu Vaccine QUAD 36+ mos IM    Orders Placed This Encounter  Procedures  . Flu Vaccine QUAD 36+ mos IM   No orders of the defined types were placed in this encounter.    [name], DO [place] [phone]

## 2020-03-10 NOTE — Patient Instructions (Signed)
She not sure why this was not filled last month but I spoke to the pharmacist and she said that the new prescription with the new directions is out for delivery.  Please make sure that you start the new day should schedule and we will follow-up on how this is working for you.  Controlled Substance Guidelines:  1. You cannot get an early refill, even it is lost.  2. You cannot get controlled medications from any other doctor, unless it is the emergency department and related to a new problem or injury.  3. You cannot use alcohol, marijuana, cocaine or any other recreational drugs while using this medication. This is very dangerous.  4. You are willing to have your urine drug tested at each visit.  5. You will not drive while using this medication, because that can put yourself and others in serious danger of an accident. 6. If any medication is stolen, then there must be a police report to verify it, or it cannot be refilled.  7. I will not prescribe these medications for longer than 3 months.  8. You must bring your pill bottle to each visit.  9. You must use the same pharmacy for all refills for the medication, unless you clear it with me beforehand.  10. You cannot share or sell this medication.

## 2020-03-13 LAB — TOXASSURE SELECT 13 (MW), URINE

## 2020-04-07 ENCOUNTER — Telehealth: Payer: Self-pay | Admitting: Licensed Clinical Social Worker

## 2020-04-07 ENCOUNTER — Telehealth (INDEPENDENT_AMBULATORY_CARE_PROVIDER_SITE_OTHER): Payer: Medicare Other | Admitting: Licensed Clinical Social Worker

## 2020-04-07 DIAGNOSIS — F331 Major depressive disorder, recurrent, moderate: Secondary | ICD-10-CM

## 2020-04-07 NOTE — BH Specialist Note (Signed)
[facility]al [facility] Treatment Plan Team Note  MRN: [id number] NAME: Jamie Mcgee  DATE: 04/07/20  Treatment Team Attendees: Royal Piedra, LCSW & Dr. Modesta Messing  Diagnoses: No diagnosis found.  Goals, Interventions and Follow-up Plan Goals: Increase healthy adjustment to current life circumstances Interventions: Supportive Counseling Medication Management Recommendations: continue current psycotropics for 6-12 months to avoid relapse in her mood symptoms if the patient has less than 2 episodes it is generally advised to continue medication indiff if the patient has a history of more than 3 depressive episodes Follow-up Plan: Signing off and if the Patient is interested or any concern please contact our team  History of the present illness Presenting Problem/Current Symptoms: symptoms of DX  Psychiatric History  Depression: Yes Anxiety: Yes Mania: No Psychosis: No PTSD symptoms: No  Past Psychiatric History/Hospitalization(s): Hospitalization for psychiatric illness: No Prior Suicide Attempts: No Prior Self-injurious behavior: No  Psychosocial stressors   Virtual [place] Phone Follow Up from 07/16/2019 in [place]  Current Stressors Other (Comment)  [CoVid 19]  Familial Stressors None  Sleep Decreased  Appetite No problems  Coping ability Overwhelmed  Patient taking medications as prescribed Yes       Self-harm Behaviors Risk Assessment   Virtual [place] Phone Follow Up from 07/16/2019 in [place]  Have you recently had any thoughts about harming yourself? No       Screenings PHQ-9 Assessments:  Depression screen [facility] 2/9 03/10/2020 02/10/2020 01/09/2020  Decreased Interest 0 1 1  Down, Depressed, Hopeless 0 0 0  PHQ - 2 Score 0 1 1  Altered sleeping 0 0 2  Tired, decreased energy 0 0 1  Change in appetite 0 0 0  Feeling bad or failure about yourself  0 1 0  Trouble concentrating 0 0 1  Moving slowly or  fidgety/restless 0 0 0  Suicidal thoughts 0 0 0  PHQ-9 Score 0 2 5  Difficult doing work/chores - Not difficult at all Not difficult at all  Some recent data might be hidden   GAD-7 Assessments:  GAD 7 : Generalized Anxiety Score 02/10/2020 01/09/2020 01/07/2020 12/03/2019  Nervous, Anxious, on Edge 1 0 1 1  Control/stop worrying 0 0 1 2  Worry too much - different things 0 0 1 1  Trouble relaxing 1 1 1 1   Restless 0 1 1 1   Easily annoyed or irritable 2 0 1 2  Afraid - awful might happen 1 0 1 1  Total GAD 7 Score 5 2 7 9   Anxiety Difficulty Not difficult at all Not difficult at all Somewhat difficult Somewhat difficult    Past Medical History Past Medical History:  Diagnosis Date   Carpal tunnel syndrome on right    Cervical stenosis of spine    C5-6   COPD (chronic obstructive pulmonary disease) (HCC)    Gold III   Depression    Fibromuscular dysplasia of renal artery ([place]) 2006   GERD (gastroesophageal reflux disease)    Hepatitis    type A in 1977   HNP (herniated nucleus pulposus), cervical    HTN (hypertension)    Neuropathy     Vital signs: There were no vitals filed for this visit.  Allergies:  Allergies as of 04/07/2020 - Review Complete 03/10/2020  Allergen Reaction Noted   Lipitor [atorvastatin] Other (See Comments) 10/13/2014   Naproxen Nausea And Vomiting 06/04/2015    Medication History Current medications:  Outpatient Encounter Medications as of 04/07/2020  Medication Sig  busPIRone (BUSPAR) 10 MG tablet Take 1 tablet (10 mg total) by mouth 3 (three) times daily.   carvedilol (COREG) 6.25 MG tablet Take 1 tablet (6.25 mg total) by mouth 2 (two) times daily with a meal.   citalopram (CELEXA) 40 MG tablet Take 1 tablet (40 mg total) by mouth daily.   diclofenac sodium (VOLTAREN) 1 % GEL Apply 4 g topically 4 (four) times daily.   hydrochlorothiazide (HYDRODIURIL) 25 MG tablet Take 1 tablet (25 mg total) by mouth daily.   hydrOXYzine (ATARAX/VISTARIL) 50 MG  tablet TAKE 1/2 TO 1 TABLET BY MOUTH THREE TIMES DAILY AS NEEDED FOR ANXIETY (SLEEP)   losartan (COZAAR) 100 MG tablet Take 1 tablet (100 mg total) by mouth daily.   methocarbamol (ROBAXIN) 750 MG tablet TAKE ONE TABLET BY MOUTH EVERY 8 HOURS AS NEEDED FOR MUSCLE SPASMS   mometasone-formoterol (DULERA) 100-5 MCG/ACT AERO Inhale 2 puffs into the lungs in the morning and at bedtime.   nitroGLYCERIN (NITROSTAT) 0.4 MG SL tablet Place 1 tablet (0.4 mg total) under the tongue every 5 (five) minutes as needed.   pregabalin (LYRICA) 150 MG capsule Take 150mg  each morning and 300mg  each evening. Pharmacy: Cancel previous rx   rosuvastatin (CRESTOR) 5 MG tablet Take 1 tablet (5 mg total) by mouth at bedtime.   No facility-administered encounter medications on file as of 04/07/2020.     Scribe for Treatment Team: [name], LCSW

## 2020-04-07 NOTE — BH Specialist Note (Signed)
[facility] Telephone Follow-up  MRN: [id number] NAME: Jamie Mcgee Date: 04/07/20  Start time:  11a End time:  1130a Total time:  70min Call number:  [phone]  Reason for call today:  follow up  PHQ-9 Scores:  Depression screen [facility] 2/9 03/10/2020 02/10/2020 01/09/2020 01/07/2020 12/03/2019  Decreased Interest 0 1 1 1 1   Down, Depressed, Hopeless 0 0 0 2 2  PHQ - 2 Score 0 1 1 3 3   Altered sleeping 0 0 2 1 1   Tired, decreased energy 0 0 1 1 1   Change in appetite 0 0 0 2 2  Feeling bad or failure about yourself  0 1 0 1 2  Trouble concentrating 0 0 1 1 1   Moving slowly or fidgety/restless 0 0 0 1 1  Suicidal thoughts 0 0 0 0 0  PHQ-9 Score 0 2 5 10 11   Difficult doing work/chores - Not difficult at all Not difficult at all Somewhat difficult Not difficult at all  Some recent data might be hidden   GAD-7 Scores:  GAD 7 : Generalized Anxiety Score 02/10/2020 01/09/2020 01/07/2020 12/03/2019  Nervous, Anxious, on Edge 1 0 1 1  Control/stop worrying 0 0 1 2  Worry too much - different things 0 0 1 1  Trouble relaxing 1 1 1 1   Restless 0 1 1 1   Easily annoyed or irritable 2 0 1 2  Afraid - awful might happen 1 0 1 1  Total GAD 7 Score 5 2 7 9   Anxiety Difficulty Not difficult at all Not difficult at all Somewhat difficult Somewhat difficult    Stress Current stressors:  denies Sleep:  WNL Appetite:  WNL Coping ability:  WNL Patient taking medications as prescribed:  yes  Current medications:  Outpatient Encounter Medications as of 04/07/2020  Medication Sig   busPIRone (BUSPAR) 10 MG tablet Take 1 tablet (10 mg total) by mouth 3 (three) times daily.   carvedilol (COREG) 6.25 MG tablet Take 1 tablet (6.25 mg total) by mouth 2 (two) times daily with a meal.   citalopram (CELEXA) 40 MG tablet Take 1 tablet (40 mg total) by mouth daily.   diclofenac sodium (VOLTAREN) 1 % GEL Apply 4 g topically 4 (four) times daily.   hydrochlorothiazide (HYDRODIURIL) 25 MG tablet  Take 1 tablet (25 mg total) by mouth daily.   hydrOXYzine (ATARAX/VISTARIL) 50 MG tablet TAKE 1/2 TO 1 TABLET BY MOUTH THREE TIMES DAILY AS NEEDED FOR ANXIETY (SLEEP)   losartan (COZAAR) 100 MG tablet Take 1 tablet (100 mg total) by mouth daily.   methocarbamol (ROBAXIN) 750 MG tablet TAKE ONE TABLET BY MOUTH EVERY 8 HOURS AS NEEDED FOR MUSCLE SPASMS   mometasone-formoterol (DULERA) 100-5 MCG/ACT AERO Inhale 2 puffs into the lungs in the morning and at bedtime.   nitroGLYCERIN (NITROSTAT) 0.4 MG SL tablet Place 1 tablet (0.4 mg total) under the tongue every 5 (five) minutes as needed.   pregabalin (LYRICA) 150 MG capsule Take 150mg  each morning and 300mg  each evening. Pharmacy: Cancel previous rx   rosuvastatin (CRESTOR) 5 MG tablet Take 1 tablet (5 mg total) by mouth at bedtime.   No facility-administered encounter medications on file as of 04/07/2020.     Self-harm Behaviors Risk Assessment Self-harm risk factors:  n/a Patient endorses recent thoughts of harming self:    [name] Suicide Severity Rating Scale: No flowsheet data found. No flowsheet data found.   Danger to Others Risk Assessment Danger to others risk factors:  n/a Patient endorses recent thoughts of harming others:    Dynamic Appraisal of Situational Aggression (DASA): No flowsheet data found.   Substance Use Assessment Patient recently consumed alcohol:  n/a  Alcohol Use Disorder Identification Test (AUDIT): No flowsheet data found. Patient recently used drugs:    Opioid Risk Assessment:    Goals, Interventions and Follow-up Plan Goals: Increase healthy adjustment to current life circumstances Interventions: Supportive Counseling Follow-up Plan: Monthly VBH sessions  Summary: Jamie Mcgee is a 59 yr old woman that was referred to [facility] by her PCP.  Jamie Mcgee's symptoms have reduced and she reports that her medication has helped her tremendously.  She denies current symptoms and reports coping skills learned have  assisted her with overcoming her diagnosis.  Patient reports getting out and being social at least 3 times per week.  She has supportive people in her life such as her daughter and a friend.  Discussion of toolbox to assist if symptoms persist or arise again.  Explored journaling, deep breathing techniques and relaxation.  [name], LCSW

## 2020-04-13 ENCOUNTER — Encounter: Payer: Self-pay | Admitting: Family Medicine

## 2020-04-13 ENCOUNTER — Ambulatory Visit (INDEPENDENT_AMBULATORY_CARE_PROVIDER_SITE_OTHER): Payer: Medicare Other | Admitting: Family Medicine

## 2020-04-13 ENCOUNTER — Other Ambulatory Visit: Payer: Self-pay

## 2020-04-13 VITALS — BP 123/67 | HR 67 | Temp 97.4°F | Ht 64.0 in | Wt 200.0 lb

## 2020-04-13 DIAGNOSIS — M5442 Lumbago with sciatica, left side: Secondary | ICD-10-CM

## 2020-04-13 DIAGNOSIS — F4323 Adjustment disorder with mixed anxiety and depressed mood: Secondary | ICD-10-CM | POA: Diagnosis not present

## 2020-04-13 DIAGNOSIS — Z1211 Encounter for screening for malignant neoplasm of colon: Secondary | ICD-10-CM

## 2020-04-13 DIAGNOSIS — M542 Cervicalgia: Secondary | ICD-10-CM

## 2020-04-13 DIAGNOSIS — G8929 Other chronic pain: Secondary | ICD-10-CM

## 2020-04-13 DIAGNOSIS — M5441 Lumbago with sciatica, right side: Secondary | ICD-10-CM

## 2020-04-13 DIAGNOSIS — F331 Major depressive disorder, recurrent, moderate: Secondary | ICD-10-CM

## 2020-04-13 DIAGNOSIS — I10 Essential (primary) hypertension: Secondary | ICD-10-CM | POA: Diagnosis not present

## 2020-04-13 DIAGNOSIS — J449 Chronic obstructive pulmonary disease, unspecified: Secondary | ICD-10-CM

## 2020-04-13 DIAGNOSIS — E7800 Pure hypercholesterolemia, unspecified: Secondary | ICD-10-CM

## 2020-04-13 MED ORDER — CARVEDILOL 6.25 MG PO TABS: 6.2500 mg | ORAL_TABLET | Freq: Two times a day (BID) | ORAL | 3 refills | Status: DC

## 2020-04-13 MED ORDER — HYDROCHLOROTHIAZIDE 25 MG PO TABS: 25.0000 mg | ORAL_TABLET | Freq: Every day | ORAL | 3 refills | Status: DC

## 2020-04-13 MED ORDER — LOSARTAN POTASSIUM 100 MG PO TABS: 100.0000 mg | ORAL_TABLET | Freq: Every day | ORAL | 3 refills | Status: DC

## 2020-04-13 MED ORDER — METHOCARBAMOL 750 MG PO TABS: ORAL_TABLET | ORAL | 2 refills | Status: DC

## 2020-04-13 MED ORDER — HYDROXYZINE HCL 50 MG PO TABS: ORAL_TABLET | ORAL | 3 refills | Status: DC

## 2020-04-13 MED ORDER — DULERA 100-5 MCG/ACT IN AERO: 2.0000 | INHALATION_SPRAY | Freq: Two times a day (BID) | RESPIRATORY_TRACT | 3 refills | Status: DC

## 2020-04-13 MED ORDER — CITALOPRAM HYDROBROMIDE 40 MG PO TABS: 40.0000 mg | ORAL_TABLET | Freq: Every day | ORAL | 3 refills | Status: DC

## 2020-04-13 MED ORDER — PREGABALIN 150 MG PO CAPS: ORAL_CAPSULE | ORAL | 1 refills | Status: DC

## 2020-04-13 MED ORDER — BUSPIRONE HCL 10 MG PO TABS: 10.0000 mg | ORAL_TABLET | Freq: Three times a day (TID) | ORAL | 3 refills | Status: DC

## 2020-04-13 MED ORDER — ROSUVASTATIN CALCIUM 5 MG PO TABS: 5.0000 mg | ORAL_TABLET | Freq: Every day | ORAL | 3 refills | Status: DC

## 2020-04-13 NOTE — Progress Notes (Signed)
Subjective: CC: Follow-up chronic low back pain PCP: [name], DO OBS:Jamie Mcgee is a 59 y.o. female presenting to clinic today for:  1.  Chronic low back pain History: Previously treated with gabapentin  At last visit, her evening dose was increased to 300 mg nightly.  She is continued on 150 mg of Lyrica each day.  Her UDS did show THC.  She was counseled on avoidance of any THC-containing products including topical oils and Gummies going forward.   She admits to using CBD Gummies.  She did not notice cannabis THC in her urine.  She reports that the increased dose of Lyrica each night has helped tremendously and she would like to continue this regimen.  Denies any excessive daytime sedation, falls, edema or visual or auditory hallucinations  She continues to require up to 3 times daily dosing of methocarbamol.  She has tried going without it advised that her back starts to spasm without it.  2.  Anxiety depression Patient reports stability of symptoms with buspirone, Celexa and as needed Atarax.  Denies any exacerbation of symptoms.  3.  Preventive care Patient does need a referral to gastroenterology for colon cancer screening.  She was previously evaluated in [place] but notes that she has transportation issues and would like to move her care closer to home.  Does not report any GI bleeding at this time. ROS: Per HPI  Allergies  Allergen Reactions  . Lipitor [Atorvastatin] Other (See Comments)    Muscle aches and nausea  . Naproxen Nausea And Vomiting    Stomach pain   Past Medical History:  Diagnosis Date  . Carpal tunnel syndrome on right   . Cervical stenosis of spine    C5-6  . COPD (chronic obstructive pulmonary disease) ([place])    Gold III  . Depression   . Fibromuscular dysplasia of renal artery ([place]) 2006  . GERD (gastroesophageal reflux disease)   . Hepatitis    type A in 1977  . HNP (herniated nucleus pulposus), cervical   . HTN  (hypertension)   . Neuropathy     Current Outpatient Medications:  .  busPIRone (BUSPAR) 10 MG tablet, Take 1 tablet (10 mg total) by mouth 3 (three) times daily., Disp: 90 tablet, Rfl: 5 .  carvedilol (COREG) 6.25 MG tablet, Take 1 tablet (6.25 mg total) by mouth 2 (two) times daily with a meal., Disp: 60 tablet, Rfl: 5 .  citalopram (CELEXA) 40 MG tablet, Take 1 tablet (40 mg total) by mouth daily., Disp: 30 tablet, Rfl: 5 .  diclofenac sodium (VOLTAREN) 1 % GEL, Apply 4 g topically 4 (four) times daily., Disp: 400 g, Rfl: 3 .  hydrochlorothiazide (HYDRODIURIL) 25 MG tablet, Take 1 tablet (25 mg total) by mouth daily., Disp: 30 tablet, Rfl: 5 .  hydrOXYzine (ATARAX/VISTARIL) 50 MG tablet, TAKE 1/2 TO 1 TABLET BY MOUTH THREE TIMES DAILY AS NEEDED FOR ANXIETY (SLEEP), Disp: 90 tablet, Rfl: 0 .  losartan (COZAAR) 100 MG tablet, Take 1 tablet (100 mg total) by mouth daily., Disp: 30 tablet, Rfl: 5 .  methocarbamol (ROBAXIN) 750 MG tablet, TAKE ONE TABLET BY MOUTH EVERY 8 HOURS AS NEEDED FOR MUSCLE SPASMS, Disp: 90 tablet, Rfl: 2 .  mometasone-formoterol (DULERA) 100-5 MCG/ACT AERO, Inhale 2 puffs into the lungs in the morning and at bedtime., Disp: 1 each, Rfl: 12 .  nitroGLYCERIN (NITROSTAT) 0.4 MG SL tablet, Place 1 tablet (0.4 mg total) under the tongue every 5 (five) minutes as  needed., Disp: 25 tablet, Rfl: 3 .  pregabalin (LYRICA) 150 MG capsule, Take 150mg  each morning and 300mg  each evening. Pharmacy: Cancel previous rx, Disp: 90 capsule, Rfl: 2 .  rosuvastatin (CRESTOR) 5 MG tablet, Take 1 tablet (5 mg total) by mouth at bedtime., Disp: 30 tablet, Rfl: 5 Social History   Socioeconomic History  . Marital status: Single    Spouse name: Not on file  . Number of children: Not on file  . Years of education: Not on file  . Highest education level: Not on file  Occupational History  . Not on file  Tobacco Use  . Smoking status: Current Every Day Smoker    Packs/day: 1.00    Years: 40.00     Pack years: 40.00    Types: Cigarettes  . Smokeless tobacco: Never Used  . Tobacco comment: pt quit smoking 2016 for 7 months but restarted last year  Vaping Use  . Vaping Use: Never used  Substance and Sexual Activity  . Alcohol use: No    Alcohol/week: 0.0 standard drinks  . Drug use: Yes    Frequency: 0.5 times per week    Types: Marijuana    Comment: weekly  . Sexual activity: Yes    Birth control/protection: None, Post-menopausal  Other Topics Concern  . Not on file  Social History Narrative   The patient does not have any sex drive and has not had one for almost a year.   Her   husband thinks she does not love him anymore.  He does not seem to understand   her   depression.   Social Determinants of Health   Financial Resource Strain:   . Difficulty of Paying Living Expenses: Not on file  Food Insecurity:   . Worried About Charity fundraiser in the Last Year: Not on file  . Ran Out of Food in the Last Year: Not on file  Transportation Needs:   . Lack of Transportation (Medical): Not on file  . Lack of Transportation (Non-Medical): Not on file  Physical Activity:   . Days of Exercise per Week: Not on file  . Minutes of Exercise per Session: Not on file  Stress:   . Feeling of Stress : Not on file  Social Connections:   . Frequency of Communication with Friends and Family: Not on file  . Frequency of Social Gatherings with Friends and Family: Not on file  . Attends Religious Services: Not on file  . Active Member of Clubs or Organizations: Not on file  . Attends Archivist Meetings: Not on file  . Marital Status: Not on file  Intimate Partner Violence:   . Fear of Current or Ex-Partner: Not on file  . Emotionally Abused: Not on file  . Physically Abused: Not on file  . Sexually Abused: Not on file   Family History  Problem Relation Age of Onset  . Coronary artery disease Father        States congenital abnormality  . Diabetes Father   .  Hyperlipidemia Father   . CAD Sister        Stents   . Cancer Sister        breast  . Cancer Mother        breast  . Cancer Maternal Aunt        breast  . Cancer Maternal Grandmother        breast  . Anesthesia problems Neg Hx   . Hypotension Neg  Hx   . Malignant hyperthermia Neg Hx   . Pseudochol deficiency Neg Hx     Objective: Office vital signs reviewed. BP 123/67   Pulse 67   Temp (!) 97.4 F (36.3 C)   Ht 5\' 4"  (1.626 m)   Wt 200 lb (90.7 kg)   LMP 05/25/2011   SpO2 96%   BMI 34.33 kg/m   Physical Examination:  General: Awake, alert, well nourished, obese. No acute distress HEENT: sclera white, MMM Cardio: regular rate and rhythm, S1S2 heard, no murmurs appreciated Pulm: Clear to auscultation bilaterally.  No wheezes, rhonchi or rales on today's exam.  Normal work of breathing on room air. Extremities: warm, well perfused, No edema, cyanosis or clubbing; +2 pulses bilaterally MSK: Continues to ambulate independently.  Normal tone.  No joint inflammation or bony abnormalities appreciated Psych: Mood stable, speech normal, affect appropriate.  Patient is pleasant and interactive  Assessment/ Plan: 59 y.o. female   1. Situational mixed anxiety and depressive disorder Stable with Celexa, buspirone and Atarax.  This has been renewed - busPIRone (BUSPAR) 10 MG tablet; Take 1 tablet (10 mg total) by mouth 3 (three) times daily.  Dispense: 270 tablet; Refill: 3 - citalopram (CELEXA) 40 MG tablet; Take 1 tablet (40 mg total) by mouth daily.  Dispense: 90 tablet; Refill: 3 - hydrOXYzine (ATARAX/VISTARIL) 50 MG tablet; TAKE 1/2 TO 1 TABLET BY MOUTH THREE TIMES DAILY AS NEEDED FOR ANXIETY (SLEEP)  Dispense: 90 tablet; Refill: 3  2. Essential hypertension, benign Controlled.  Continue current regimen - carvedilol (COREG) 6.25 MG tablet; Take 1 tablet (6.25 mg total) by mouth 2 (two) times daily with a meal.  Dispense: 180 tablet; Refill: 3 - hydrochlorothiazide  (HYDRODIURIL) 25 MG tablet; Take 1 tablet (25 mg total) by mouth daily.  Dispense: 90 tablet; Refill: 3 - losartan (COZAAR) 100 MG tablet; Take 1 tablet (100 mg total) by mouth daily.  Dispense: 90 tablet; Refill: 3  3. Moderate episode of recurrent major depressive disorder (HCC) Stable - citalopram (CELEXA) 40 MG tablet; Take 1 tablet (40 mg total) by mouth daily.  Dispense: 90 tablet; Refill: 3  4. Chronic bilateral low back pain with bilateral sciatica Stable with increased dose of Lyrica.  This has been renewed.  She may follow-up in 6 months, sooner if needed.  UDS and CSC are updated. - methocarbamol (ROBAXIN) 750 MG tablet; TAKE ONE TABLET BY MOUTH EVERY 8 HOURS AS NEEDED FOR MUSCLE SPASMS  Dispense: 90 tablet; Refill: 2 - pregabalin (LYRICA) 150 MG capsule; Take 150mg  each morning and 300mg  each evening. Pharmacy: Cancel previous rx  Dispense: 270 capsule; Refill: 1  5. Neck pain - methocarbamol (ROBAXIN) 750 MG tablet; TAKE ONE TABLET BY MOUTH EVERY 8 HOURS AS NEEDED FOR MUSCLE SPASMS  Dispense: 90 tablet; Refill: 2  6. COPD  GOLD 0 / still smoking  Continue Dulera - mometasone-formoterol (DULERA) 100-5 MCG/ACT AERO; Inhale 2 puffs into the lungs in the morning and at bedtime.  Dispense: 3 each; Refill: 3  7. Pure hypercholesterolemia Continue statin.  She will come in for fasting labs at her next visit - rosuvastatin (CRESTOR) 5 MG tablet; Take 1 tablet (5 mg total) by mouth at bedtime.  Dispense: 90 tablet; Refill: 3   No orders of the defined types were placed in this encounter.  No orders of the defined types were placed in this encounter.  The Narcotic Database has been reviewed.  There were no red flags.     Jamie Mcgee  Jamie Mcgee, Jamie Mcgee [phone]

## 2020-04-15 ENCOUNTER — Encounter: Payer: Self-pay | Admitting: Internal Medicine

## 2020-05-18 ENCOUNTER — Ambulatory Visit (INDEPENDENT_AMBULATORY_CARE_PROVIDER_SITE_OTHER): Payer: Medicare Other | Admitting: Gastroenterology

## 2020-05-18 ENCOUNTER — Other Ambulatory Visit: Payer: Self-pay

## 2020-05-18 ENCOUNTER — Encounter: Payer: Self-pay | Admitting: *Deleted

## 2020-05-18 ENCOUNTER — Encounter: Payer: Self-pay | Admitting: Gastroenterology

## 2020-05-18 DIAGNOSIS — K649 Unspecified hemorrhoids: Secondary | ICD-10-CM

## 2020-05-18 DIAGNOSIS — Z1211 Encounter for screening for malignant neoplasm of colon: Secondary | ICD-10-CM | POA: Diagnosis not present

## 2020-05-18 NOTE — Patient Instructions (Signed)
Colonoscopy as scheduled.  Please see separate instructions.

## 2020-05-18 NOTE — Progress Notes (Signed)
Primary Care Physician:  [name], DO  Primary Gastroenterologist:  Elon Alas. Abbey Chatters, DO   Chief Complaint  Patient presents with  . Colonoscopy    Due for tcs  . Hemorrhoids    Pain, bleeding    HPI:  Jamie Mcgee is a 59 y.o. female here at the request of Dr. Lajuana Ripple for consideration of colonoscopy.  Last colonoscopy in February 2012.  2 mm diminutive hyperplastic polyp in the rectum removed.  Melanosis coli noted.  Patient denies bowel concerns. No constipation, diarrhea. No melena, brbpr. No abd pain. No heartburn. No FH of colon cancer.    Current Outpatient Medications  Medication Sig Dispense Refill  . busPIRone (BUSPAR) 10 MG tablet Take 1 tablet (10 mg total) by mouth 3 (three) times daily. 270 tablet 3  . carvedilol (COREG) 6.25 MG tablet Take 1 tablet (6.25 mg total) by mouth 2 (two) times daily with a meal. 180 tablet 3  . citalopram (CELEXA) 40 MG tablet Take 1 tablet (40 mg total) by mouth daily. 90 tablet 3  . diclofenac sodium (VOLTAREN) 1 % GEL Apply 4 g topically 4 (four) times daily. 400 g 3  . hydrochlorothiazide (HYDRODIURIL) 25 MG tablet Take 1 tablet (25 mg total) by mouth daily. 90 tablet 3  . hydrOXYzine (ATARAX/VISTARIL) 50 MG tablet TAKE 1/2 TO 1 TABLET BY MOUTH THREE TIMES DAILY AS NEEDED FOR ANXIETY (SLEEP) 90 tablet 3  . losartan (COZAAR) 100 MG tablet Take 1 tablet (100 mg total) by mouth daily. 90 tablet 3  . methocarbamol (ROBAXIN) 750 MG tablet TAKE ONE TABLET BY MOUTH EVERY 8 HOURS AS NEEDED FOR MUSCLE SPASMS 90 tablet 2  . mometasone-formoterol (DULERA) 100-5 MCG/ACT AERO Inhale 2 puffs into the lungs in the morning and at bedtime. 3 each 3  . nitroGLYCERIN (NITROSTAT) 0.4 MG SL tablet Place 1 tablet (0.4 mg total) under the tongue every 5 (five) minutes as needed. 25 tablet 3  . pregabalin (LYRICA) 150 MG capsule Take 150mg  each morning and 300mg  each evening. Pharmacy: Cancel previous rx 270 capsule 1  . rosuvastatin (CRESTOR)  5 MG tablet Take 1 tablet (5 mg total) by mouth at bedtime. 90 tablet 3   No current facility-administered medications for this visit.    Allergies as of 05/18/2020 - Review Complete 05/18/2020  Allergen Reaction Noted  . Lipitor [atorvastatin] Other (See Comments) 10/13/2014  . Naproxen Nausea And Vomiting 06/04/2015    Past Medical History:  Diagnosis Date  . Carpal tunnel syndrome on right   . Cervical stenosis of spine    C5-6  . 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 Depression   . Fibromuscular dysplasia of renal artery ([place]) 2006  . GERD (gastroesophageal reflux disease)   . Hepatitis    type A in 1977  . HNP (herniated nucleus pulposus), cervical   . HTN (hypertension)   . Neuropathy     Past Surgical History:  Procedure Laterality Date  . ANTERIOR CERVICAL DECOMP/DISCECTOMY FUSION N/A 10/05/2017   Procedure: C5-6 ANTERIOR CERVICAL DECOMPRESSION/DISCECTOMY FUSION,  ALLOGRAFT, PLATE  (NECK FIRST AND CARPAL TUNNEL RELEASE SECOND);  Surgeon: Marybelle Killings, MD;  Location: [place];  Service: Orthopedics;  Laterality: N/A;  . APPENDECTOMY  1993  . BREAST BIOPSY    . CARPAL TUNNEL RELEASE Right 10/05/2017   Procedure: RIGHT CARPAL TUNNEL RELEASE;  Surgeon: Marybelle Killings, MD;  Location: [place];  Service: Orthopedics;  Laterality: Right;  .  CATARACT EXTRACTION  08-2010   left   . CATARACT EXTRACTION W/PHACO  06/01/2011   Procedure: CATARACT EXTRACTION PHACO AND INTRAOCULAR LENS PLACEMENT (IOC);  Surgeon: Tonny Branch;  Location: [facility];  Service: Ophthalmology;  Laterality: Right;  CDE:9.62  . CHOLECYSTECTOMY    . RENAL ARTERY ANGIOPLASTY  2006   right  . TUBAL LIGATION  1984   [facility]    Family History  Problem Relation Age of Onset  . Coronary artery disease Father        States congenital abnormality  . Diabetes Father   . Hyperlipidemia Father   . CAD Sister        Stents   . Cancer Sister        breast  . Cancer Mother        breast  .  Cancer Maternal Aunt        breast  . Cancer Maternal Grandmother        breast  . Anesthesia problems Neg Hx   . Hypotension Neg Hx   . Malignant hyperthermia Neg Hx   . Pseudochol deficiency Neg Hx     Social History   Socioeconomic History  . Marital status: Single    Spouse name: Not on file  . Number of children: Not on file  . Years of education: Not on file  . Highest education level: Not on file  Occupational History  . Not on file  Tobacco Use  . Smoking status: Current Every Day Smoker    Packs/day: 1.00    Years: 40.00    Pack years: 40.00    Types: Cigarettes  . Smokeless tobacco: Never Used  . Tobacco comment: pt quit smoking 2016 for 7 months but restarted last year  Vaping Use  . Vaping Use: Never used  Substance and Sexual Activity  . Alcohol use: No    Alcohol/week: 0.0 standard drinks  . Drug use: Yes    Frequency: 0.5 times per week    Types: Marijuana    Comment: weekly  . Sexual activity: Yes    Birth control/protection: None, Post-menopausal  Other Topics Concern  . Not on file  Social History Narrative   The patient does not have any sex drive and has not had one for almost a year.   Her   husband thinks she does not love him anymore.  He does not seem to understand   her   depression.   Social Determinants of Health   Financial Resource Strain: Not on file  Food Insecurity: Not on file  Transportation Needs: Not on file  Physical Activity: Not on file  Stress: Not on file  Social Connections: Not on file  Intimate Partner Violence: Not on file      ROS:  General: Negative for anorexia, weight loss, fever, chills, fatigue, weakness. Eyes: Negative for vision changes.  ENT: Negative for hoarseness, difficulty swallowing , nasal congestion. CV: Negative for chest pain, angina, palpitations, dyspnea on exertion, peripheral edema.  Respiratory: Negative for dyspnea at rest, dyspnea on exertion, cough, sputum, wheezing.  GI: See  history of present illness. GU:  Negative for dysuria, hematuria, urinary incontinence, urinary frequency, nocturnal urination.  MS: Negative for joint pain, low back pain.  Derm: Negative for rash or itching.  Neuro: Negative for weakness, abnormal sensation, seizure, frequent headaches, memory loss, confusion.  Psych: Negative for anxiety, depression, suicidal ideation, hallucinations.  Endo: Negative for unusual weight change.  Heme: Negative for bruising or bleeding. Allergy:  Negative for rash or hives.    Physical Examination:  BP 109/74   Pulse 75   Temp (!) 96 F (35.6 C) (Temporal)   Ht 5\' 4"  (1.626 m)   Wt 201 lb 6.4 oz (91.4 kg)   LMP 05/25/2011   BMI 34.57 kg/m    General: Well-nourished, well-developed in no acute distress.  Head: Normocephalic, atraumatic.   Eyes: Conjunctiva pink, no icterus. Mouth: masked Neck: Supple without thyromegaly, masses, or lymphadenopathy.  Lungs: Clear to auscultation bilaterally.  Heart: Regular rate and rhythm, no murmurs rubs or gallops.  Abdomen: Bowel sounds are normal, nontender, nondistended, no hepatosplenomegaly or masses, no abdominal bruits or    hernia , no rebound or guarding.   Rectal: not performed Extremities: No lower extremity edema. No clubbing or deformities.  Neuro: Alert and oriented x 4 , grossly normal neurologically.  Skin: Warm and dry, no rash or jaundice.   Psych: Alert and cooperative, normal mood and affect.  Labs: Lab Results  Component Value Date   CREATININE 0.82 01/09/2020   BUN 8 01/09/2020   NA 138 01/09/2020   K 3.7 01/09/2020   CL 98 01/09/2020   CO2 28 01/09/2020   Lab Results  Component Value Date   ALT 20 01/09/2020   AST 35 01/09/2020   ALKPHOS 107 01/09/2020   BILITOT 0.5 01/09/2020   Lab Results  Component Value Date   WBC 8.5 01/09/2020   HGB 14.8 01/09/2020   HCT 43.3 01/09/2020   MCV 88 01/09/2020   PLT 214 01/09/2020     Imaging Studies: No results  found.  Impression/Plan:  Pleasant 59 y/o female with h/o HTN, COPD, ongoing smoker presenting to schedule screening colonoscopy. No GI complaints. Last colonoscopy 2012.   Plan for colonoscopy in the near future with Dr. Abbey Chatters. ASA III.  I have discussed the risks, alternatives, benefits with regards to but not limited to the risk of reaction to medication, bleeding, infection, perforation and the patient is agreeable to proceed. Written consent to be obtained.

## 2020-05-21 ENCOUNTER — Encounter: Payer: Self-pay | Admitting: Gastroenterology

## 2020-05-24 NOTE — Progress Notes (Signed)
CC'ED TO PCP 

## 2020-05-31 ENCOUNTER — Encounter: Payer: Self-pay | Admitting: *Deleted

## 2020-07-02 NOTE — Patient Instructions (Signed)
Jamie Mcgee  07/02/2020     @PREFPERIOPPHARMACY @   Your procedure is scheduled on 07/06/2020   Report to [facility] at  0900  A.M.   Call this number if you have problems the morning of surgery:  [phone]   Remember:  Follow the diet and prep instructions given to you by the office.                     Take these medicines the morning of surgery with A SIP OF WATER             Buspar, carvedilol, celexa, allegra, hydroxyzine, robaxin (if needed), prilosec, lyrica.         Use your dulera before you come.   Please brush your teeth.  Do not wear jewelry, make-up or nail polish.  Do not wear lotions, powders, or perfumes, or deodorant.  Do not shave 48 hours prior to surgery.  Men may shave face and neck.  Do not bring valuables to the hospital.  [facility] is not responsible for any belongings or valuables.  Contacts, dentures or bridgework may not be worn into surgery.  Leave your suitcase in the car.  After surgery it may be brought to your room.  For patients admitted to the hospital, discharge time will be determined by your treatment team.  Patients discharged the day of surgery will not be allowed to drive home and must have someone with them for 24 hours.   Special instructions:  DO NOT smoke tobacco or vape the morning of your procedure.  Please read over the following fact sheets that you were given. Anesthesia Post-op Instructions and Care and Recovery After Surgery       Colonoscopy, Adult, Care After This sheet gives you information about how to care for yourself after your procedure. Your health care provider may also give you more specific instructions. If you have problems or questions, contact your health care provider. What can I expect after the procedure? After the procedure, it is common to have:  A small amount of blood in your stool for 24 hours after the procedure.  Some gas.  Mild cramping or bloating of your  abdomen. Follow these instructions at home: Eating and drinking  Drink enough fluid to keep your urine pale yellow.  Follow instructions from your health care provider about eating or drinking restrictions.  Resume your normal diet as instructed by your health care provider. Avoid heavy or fried foods that are hard to digest.   Activity  Rest as told by your health care provider.  Avoid sitting for a long time without moving. Get up to take short walks every 1-2 hours. This is important to improve blood flow and breathing. Ask for help if you feel weak or unsteady.  Return to your normal activities as told by your health care provider. Ask your health care provider what activities are safe for you. Managing cramping and bloating  Try walking around when you have cramps or feel bloated.  Apply heat to your abdomen as told by your health care provider. Use the heat source that your health care provider recommends, such as a moist heat pack or a heating pad. ? Place a towel between your skin and the heat source. ? Leave the heat on for 20-30 minutes. ? Remove the heat if your skin turns bright red. This is especially important if you are unable to feel pain, heat,  or cold. You may have a greater risk of getting burned.   General instructions  If you were given a sedative during the procedure, it can affect you for several hours. Do not drive or operate machinery until your health care provider says that it is safe.  For the first 24 hours after the procedure: ? Do not sign important documents. ? Do not drink alcohol. ? Do your regular daily activities at a slower pace than normal. ? Eat soft foods that are easy to digest.  Take over-the-counter and prescription medicines only as told by your health care provider.  Keep all follow-up visits as told by your health care provider. This is important. Contact a health care provider if:  You have blood in your stool 2-3 days after the  procedure. Get help right away if you have:  More than a small spotting of blood in your stool.  Large blood clots in your stool.  Swelling of your abdomen.  Nausea or vomiting.  A fever.  Increasing pain in your abdomen that is not relieved with medicine. Summary  After the procedure, it is common to have a small amount of blood in your stool. You may also have mild cramping and bloating of your abdomen.  If you were given a sedative during the procedure, it can affect you for several hours. Do not drive or operate machinery until your health care provider says that it is safe.  Get help right away if you have a lot of blood in your stool, nausea or vomiting, a fever, or increased pain in your abdomen. This information is not intended to replace advice given to you by your health care provider. Make sure you discuss any questions you have with your health care provider. Document Revised: 05/16/2019 Document Reviewed: 12/16/2018 Elsevier Patient Education  2021 [employer]. Monitored Anesthesia Care, Care After This sheet gives you information about how to care for yourself after your procedure. Your health care provider may also give you more specific instructions. If you have problems or questions, contact your health care provider. What can I expect after the procedure? After the procedure, it is common to have:  Tiredness.  Forgetfulness about what happened after the procedure.  Impaired judgment for important decisions.  Nausea or vomiting.  Some difficulty with balance. Follow these instructions at home: For the time period you were told by your health care provider:  Rest as needed.  Do not participate in activities where you could fall or become injured.  Do not drive or use machinery.  Do not drink alcohol.  Do not take sleeping pills or medicines that cause drowsiness.  Do not make important decisions or sign legal documents.  Do not take care of  children on your own.      Eating and drinking  Follow the diet that is recommended by your health care provider.  Drink enough fluid to keep your urine pale yellow.  If you vomit: ? Drink water, juice, or soup when you can drink without vomiting. ? Make sure you have little or no nausea before eating solid foods. General instructions  Have a responsible adult stay with you for the time you are told. It is important to have someone help care for you until you are awake and alert.  Take over-the-counter and prescription medicines only as told by your health care provider.  If you have sleep apnea, surgery and certain medicines can increase your risk for breathing problems. Follow instructions from  your health care provider about wearing your sleep device: ? Anytime you are sleeping, including during daytime naps. ? While taking prescription pain medicines, sleeping medicines, or medicines that make you drowsy.  Avoid smoking.  Keep all follow-up visits as told by your health care provider. This is important. Contact a health care provider if:  You keep feeling nauseous or you keep vomiting.  You feel light-headed.  You are still sleepy or having trouble with balance after 24 hours.  You develop a rash.  You have a fever.  You have redness or swelling around the IV site. Get help right away if:  You have trouble breathing.  You have new-onset confusion at home. Summary  For several hours after your procedure, you may feel tired. You may also be forgetful and have poor judgment.  Have a responsible adult stay with you for the time you are told. It is important to have someone help care for you until you are awake and alert.  Rest as told. Do not drive or operate machinery. Do not drink alcohol or take sleeping pills.  Get help right away if you have trouble breathing, or if you suddenly become confused. This information is not intended to replace advice given to you  by your health care provider. Make sure you discuss any questions you have with your health care provider. Document Revised: 02/05/2020 Document Reviewed: 04/24/2019 Elsevier Patient Education  2021 [employer].

## 2020-07-05 ENCOUNTER — Encounter (HOSPITAL_COMMUNITY): Payer: Self-pay

## 2020-07-05 ENCOUNTER — Encounter (HOSPITAL_COMMUNITY)
Admission: RE | Admit: 2020-07-05 | Discharge: 2020-07-05 | Disposition: A | Discharge status: Home / Self Care | Payer: Medicare Other | Source: Ambulatory Visit | Attending: Internal Medicine | Admitting: Internal Medicine

## 2020-07-05 ENCOUNTER — Encounter (HOSPITAL_COMMUNITY): Payer: Self-pay | Admitting: *Deleted

## 2020-07-05 ENCOUNTER — Other Ambulatory Visit: Payer: Self-pay

## 2020-07-05 ENCOUNTER — Other Ambulatory Visit (HOSPITAL_COMMUNITY)
Admission: RE | Admit: 2020-07-05 | Discharge: 2020-07-05 | Disposition: A | Discharge status: Home / Self Care | Payer: Medicare Other | Source: Ambulatory Visit | Attending: *Deleted | Admitting: *Deleted

## 2020-07-05 DIAGNOSIS — Z20822 Contact with and (suspected) exposure to covid-19: Secondary | ICD-10-CM | POA: Diagnosis not present

## 2020-07-05 DIAGNOSIS — Z01818 Encounter for other preprocedural examination: Secondary | ICD-10-CM | POA: Insufficient documentation

## 2020-07-05 LAB — SARS CORONAVIRUS 2 (TAT 6-24 HRS): SARS Coronavirus 2: NEGATIVE

## 2020-07-05 NOTE — Progress Notes (Addendum)
Spoke with pt for pre-op call. Pt denies cardiac history and Diabetes.  Covid test done today, it is negative. Pt states she has been in quarantine since the test was done and understands she stays in quarantine until she comes to the hospital in the morning.  EKG - 07/05/20 Echo - 06/08/14

## 2020-07-06 ENCOUNTER — Ambulatory Visit (HOSPITAL_COMMUNITY)
Admission: RE | Admit: 2020-07-06 | Discharge: 2020-07-06 | Disposition: A | Discharge status: Home / Self Care | Payer: Medicare Other | Attending: Internal Medicine | Admitting: Internal Medicine

## 2020-07-06 ENCOUNTER — Encounter (HOSPITAL_COMMUNITY): Payer: Self-pay

## 2020-07-06 ENCOUNTER — Encounter (HOSPITAL_COMMUNITY)
Admission: RE | Disposition: A | Discharge status: Home / Self Care | Payer: Self-pay | Source: Home / Self Care | Attending: Internal Medicine

## 2020-07-06 ENCOUNTER — Ambulatory Visit (HOSPITAL_COMMUNITY): Payer: Medicare Other | Admitting: Anesthesiology

## 2020-07-06 DIAGNOSIS — Z8249 Family history of ischemic heart disease and other diseases of the circulatory system: Secondary | ICD-10-CM | POA: Diagnosis not present

## 2020-07-06 DIAGNOSIS — F17210 Nicotine dependence, cigarettes, uncomplicated: Secondary | ICD-10-CM | POA: Insufficient documentation

## 2020-07-06 DIAGNOSIS — Z9049 Acquired absence of other specified parts of digestive tract: Secondary | ICD-10-CM | POA: Diagnosis not present

## 2020-07-06 DIAGNOSIS — Z1211 Encounter for screening for malignant neoplasm of colon: Secondary | ICD-10-CM | POA: Insufficient documentation

## 2020-07-06 DIAGNOSIS — Z981 Arthrodesis status: Secondary | ICD-10-CM | POA: Insufficient documentation

## 2020-07-06 DIAGNOSIS — Z7951 Long term (current) use of inhaled steroids: Secondary | ICD-10-CM | POA: Diagnosis not present

## 2020-07-06 DIAGNOSIS — Z888 Allergy status to other drugs, medicaments and biological substances status: Secondary | ICD-10-CM | POA: Insufficient documentation

## 2020-07-06 DIAGNOSIS — Z886 Allergy status to analgesic agent status: Secondary | ICD-10-CM | POA: Insufficient documentation

## 2020-07-06 DIAGNOSIS — I10 Essential (primary) hypertension: Secondary | ICD-10-CM | POA: Diagnosis not present

## 2020-07-06 DIAGNOSIS — Z79899 Other long term (current) drug therapy: Secondary | ICD-10-CM | POA: Diagnosis not present

## 2020-07-06 DIAGNOSIS — K648 Other hemorrhoids: Secondary | ICD-10-CM | POA: Diagnosis not present

## 2020-07-06 HISTORY — DX: Headache, unspecified: R51.9

## 2020-07-06 HISTORY — PX: COLONOSCOPY WITH PROPOFOL: SHX5780

## 2020-07-06 HISTORY — DX: Unspecified osteoarthritis, unspecified site: M19.90

## 2020-07-06 HISTORY — DX: Anxiety disorder, unspecified: F41.9

## 2020-07-06 SURGERY — COLONOSCOPY WITH PROPOFOL
Anesthesia: Monitor Anesthesia Care

## 2020-07-06 MED ORDER — PROPOFOL 500 MG/50ML IV EMUL
INTRAVENOUS | Status: DC | PRN
  Administered 2020-07-06: 150 ug/kg/min via INTRAVENOUS

## 2020-07-06 MED ORDER — LACTATED RINGERS IV SOLN: INTRAVENOUS | Status: DC | PRN

## 2020-07-06 MED ORDER — PROPOFOL 10 MG/ML IV BOLUS
INTRAVENOUS | Status: DC | PRN
  Administered 2020-07-06: 50 mg via INTRAVENOUS

## 2020-07-06 NOTE — Discharge Instructions (Signed)
  Colonoscopy Discharge Instructions  Read the instructions outlined below and refer to this sheet in the next few weeks. These discharge instructions provide you with general information on caring for yourself after you leave the hospital. Your doctor may also give you specific instructions. While your treatment has been planned according to the most current medical practices available, unavoidable complications occasionally occur.   ACTIVITY  You may resume your regular activity, but move at a slower pace for the next 24 hours.   Take frequent rest periods for the next 24 hours.   Walking will help get rid of the air and reduce the bloated feeling in your belly (abdomen).   No driving for 24 hours (because of the medicine (anesthesia) used during the test).    Do not sign any important legal documents or operate any machinery for 24 hours (because of the anesthesia used during the test).  NUTRITION  Drink plenty of fluids.   You may resume your normal diet as instructed by your doctor.   Begin with a light meal and progress to your normal diet. Heavy or fried foods are harder to digest and may make you feel sick to your stomach (nauseated).   Avoid alcoholic beverages for 24 hours or as instructed.  MEDICATIONS  You may resume your normal medications unless your doctor tells you otherwise.  WHAT YOU CAN EXPECT TODAY  Some feelings of bloating in the abdomen.   Passage of more gas than usual.   Spotting of blood in your stool or on the toilet paper.  IF YOU HAD POLYPS REMOVED DURING THE COLONOSCOPY:  No aspirin products for 7 days or as instructed.   No alcohol for 7 days or as instructed.   Eat a soft diet for the next 24 hours.  FINDING OUT THE RESULTS OF YOUR TEST Not all test results are available during your visit. If your test results are not back during the visit, make an appointment with your caregiver to find out the results. Do not assume everything is normal if  you have not heard from your caregiver or the medical facility. It is important for you to follow up on all of your test results.  SEEK IMMEDIATE MEDICAL ATTENTION IF:  You have more than a spotting of blood in your stool.   Your belly is swollen (abdominal distention).   You are nauseated or vomiting.   You have a temperature over 101.   You have abdominal pain or discomfort that is severe or gets worse throughout the day.   Your colonoscopy was relatively unremarkable.  I did not find any polyps or evidence of colon cancer.  Repeat in 10 years for screening purposes.  Follow-up with GI as needed.  I hope you have a great rest of your week!  Elon Alas. Abbey Chatters, D.O. Gastroenterology and Hepatology [facility]

## 2020-07-06 NOTE — Op Note (Signed)
[facility] Patient Name: Jamie Mcgee Procedure Date : 07/06/2020 MRN: [id number] Attending MD: Elon Alas. Abbey Chatters , DO Date of Birth: 04/16/1961 CSN: [id number] Age: 60 Admit Type: Outpatient Procedure:                Colonoscopy Indications:              Screening for colorectal malignant neoplasm Providers:                Elon Alas. Abbey Chatters, DO, Kary Kos RN, RN, Lesia Sago, Technician Referring MD:              Medicines:                See the Anesthesia note for documentation of the                            administered medications Complications:            No immediate complications. Estimated Blood Loss:     Estimated blood loss: none. Procedure:                Pre-Anesthesia Assessment:                           - The anesthesia plan was to use monitored                            anesthesia care (MAC).                           After obtaining informed consent, the colonoscope                            was passed under direct vision. Throughout the                            procedure, the patient's blood pressure, pulse, and                            oxygen saturations were monitored continuously. The                            PCF-H190DL ([id number]) Olympus pediatric colonscope                            was introduced through the anus and advanced to the                            the cecum, identified by appendiceal orifice and                            ileocecal valve. The colonoscopy was performed                            without difficulty. The patient tolerated the  procedure well. The quality of the bowel                            preparation was evaluated using the BBPS [facility]                            Bowel Preparation Scale) with scores of: Right                            Colon = 2 (minor amount of residual staining, small                            fragments of stool and/or  opaque liquid, but mucosa                            seen well), Transverse Colon = 3 (entire mucosa                            seen well with no residual staining, small                            fragments of stool or opaque liquid) and Left Colon                            = 3 (entire mucosa seen well with no residual                            staining, small fragments of stool or opaque                            liquid). The total BBPS score equals 8. The quality                            of the bowel preparation was good. Scope In: 1:26:53 PM Scope Out: 1:44:21 PM Scope Withdrawal Time: 0 hours 12 minutes 15 seconds  Total Procedure Duration: 0 hours 17 minutes 28 seconds  Findings:      The perianal and digital rectal examinations were normal.      Non-bleeding internal hemorrhoids were found during endoscopy.      The exam was otherwise without abnormality. Impression:               - Non-bleeding internal hemorrhoids.                           - The examination was otherwise normal.                           - No specimens collected. Recommendation:           - Patient has a contact number available for                            emergencies. The signs and symptoms of potential  delayed complications were discussed with the                            patient. Return to normal activities tomorrow.                            Written discharge instructions were provided to the                            patient.                           - Resume previous diet.                           - Continue present medications.                           - Repeat colonoscopy in 10 years for screening                            purposes.                           - Return to GI clinic PRN. Procedure Code(s):        --- Professional ---                           O8786, Colorectal cancer screening; colonoscopy on                            individual not meeting  criteria for high risk Diagnosis Code(s):        --- Professional ---                           Z12.11, Encounter for screening for malignant                            neoplasm of colon                           K64.8, Other hemorrhoids CPT copyright 2019 American Medical Association. All rights reserved. The codes documented in this report are preliminary and upon coder review may  be revised to meet current compliance requirements. Elon Alas. Abbey Chatters, DO [place] Abbey Chatters, DO 07/06/2020 1:46:50 PM This report has been signed electronically. Number of Addenda: 0

## 2020-07-06 NOTE — Transfer of Care (Signed)
Immediate Anesthesia Transfer of Care Note  Patient: Jamie Mcgee  Procedure(s) Performed: COLONOSCOPY WITH PROPOFOL (N/A )  Patient Location: PACU  Anesthesia Type:General  Level of Consciousness: awake, alert , oriented and patient cooperative  Airway & Oxygen Therapy: Patient Spontanous Breathing  Post-op Assessment: Report given to RN, Post -op Vital signs reviewed and stable and Patient moving all extremities X 4  Post vital signs: Reviewed and stable  Last Vitals:  Vitals Value Taken Time  BP    Temp    Pulse 64 07/06/20 1350  Resp 12 07/06/20 1350  SpO2 95 % 07/06/20 1350  Vitals shown include unvalidated device data.  Last Pain:  Vitals:   07/06/20 1127  TempSrc: Oral  PainSc: 0-No pain         Complications: No complications documented.

## 2020-07-06 NOTE — Anesthesia Preprocedure Evaluation (Addendum)
Anesthesia Evaluation  Patient identified by MRN, date of birth, ID band Patient awake    Reviewed: Allergy & Precautions, NPO status , Patient's Chart, lab work & pertinent test results, reviewed documented beta blocker date and time   History of Anesthesia Complications Negative for: history of anesthetic complications  Airway Mallampati: II  TM Distance: >3 FB Neck ROM: Full    Dental  (+) Missing,    Pulmonary sleep apnea , COPD,  COPD inhaler, Current Smoker,    Pulmonary exam normal        Cardiovascular hypertension, Pt. on medications and Pt. on home beta blockers Normal cardiovascular exam     Neuro/Psych Anxiety Depression negative neurological ROS     GI/Hepatic Neg liver ROS, GERD  Medicated and Controlled,  Endo/Other  negative endocrine ROS  Renal/GU negative Renal ROS  negative genitourinary   Musculoskeletal negative musculoskeletal ROS (+)   Abdominal   Peds  Hematology negative hematology ROS (+)   Anesthesia Other Findings Day of surgery medications reviewed with patient.  Reproductive/Obstetrics negative OB ROS                            Anesthesia Physical Anesthesia Plan  ASA: III  Anesthesia Plan: MAC   Post-op Pain Management:    Induction:   PONV Risk Score and Plan: Treatment may vary due to age or medical condition and Propofol infusion  Airway Management Planned: Natural Airway and Nasal Cannula  Additional Equipment:   Intra-op Plan:   Post-operative Plan:   Informed Consent: I have reviewed the patients History and Physical, chart, labs and discussed the procedure including the risks, benefits and alternatives for the proposed anesthesia with the patient or authorized representative who has indicated his/her understanding and acceptance.       Plan Discussed with: CRNA  Anesthesia Plan Comments:         Anesthesia Quick  Evaluation

## 2020-07-06 NOTE — H&P (Signed)
Primary Care Physician:  [name], DO Primary Gastroenterologist:  Dr. Abbey Chatters  Pre-Procedure History & Physical: HPI:  Jamie Mcgee is a 60 y.o. female is here for a colonoscopy for colon cancer screening purposes.  Last colonoscopy 2012 unremarkable besides one small hyperplastic polyp in the rectum. Patient denies any family history of colorectal cancer.  No melena or hematochezia.  No abdominal pain or unintentional weight loss.  No change in bowel habits.  Overall feels well from a GI standpoint.  Past Medical History:  Diagnosis Date  . Anxiety   . Arthritis   . Carpal tunnel syndrome on right   . Cervical stenosis of spine    C5-6  . COPD (chronic obstructive pulmonary disease) ([place])    Gold III  . Depression   . Fibromuscular dysplasia of renal artery ([place]) 2006  . GERD (gastroesophageal reflux disease)   . Headache    migraines 20 years ago  . Hepatitis    type A in 1977  . HNP (herniated nucleus pulposus), cervical   . HTN (hypertension)   . Neuropathy     Past Surgical History:  Procedure Laterality Date  . ANTERIOR CERVICAL DECOMP/DISCECTOMY FUSION N/A 10/05/2017   Procedure: C5-6 ANTERIOR CERVICAL DECOMPRESSION/DISCECTOMY FUSION,  ALLOGRAFT, PLATE  (NECK FIRST AND CARPAL TUNNEL RELEASE SECOND);  Surgeon: Marybelle Killings, MD;  Location: [facility];  Service: Orthopedics;  Laterality: N/A;  . APPENDECTOMY  1993  . BREAST BIOPSY Right   . CARPAL TUNNEL RELEASE Right 10/05/2017   Procedure: RIGHT CARPAL TUNNEL RELEASE;  Surgeon: Marybelle Killings, MD;  Location: [facility];  Service: Orthopedics;  Laterality: Right;  . CATARACT EXTRACTION  08-2010   left   . CATARACT EXTRACTION W/PHACO  06/01/2011   Procedure: CATARACT EXTRACTION PHACO AND INTRAOCULAR LENS PLACEMENT (IOC);  Surgeon: Tonny Branch;  Location: [facility];  Service: Ophthalmology;  Laterality: Right;  CDE:9.62  . CHOLECYSTECTOMY    . RENAL ARTERY ANGIOPLASTY  2006   right  . TUBAL LIGATION  1984   [facility]    Prior  to Admission medications   Medication Sig Start Date End Date Taking? Authorizing Provider  busPIRone (BUSPAR) 10 MG tablet Take 1 tablet (10 mg total) by mouth 3 (three) times daily. Patient taking differently: Take 10 mg by mouth 2 (two) times daily. 04/13/20  Yes Ronnie Doss M, DO  carvedilol (COREG) 6.25 MG tablet Take 1 tablet (6.25 mg total) by mouth 2 (two) times daily with a meal. 04/13/20  Yes Gottschalk, Ashly M, DO  citalopram (CELEXA) 40 MG tablet Take 1 tablet (40 mg total) by mouth daily. 04/13/20  Yes Gottschalk, Ashly M, DO  fexofenadine (ALLEGRA) 180 MG tablet Take 180 mg by mouth daily.   Yes [provider]  hydrochlorothiazide (HYDRODIURIL) 25 MG tablet Take 1 tablet (25 mg total) by mouth daily. 04/13/20  Yes Gottschalk, Leatrice Jewels M, DO  losartan (COZAAR) 100 MG tablet Take 1 tablet (100 mg total) by mouth daily. 04/13/20  Yes Gottschalk, Ashly M, DO  methocarbamol (ROBAXIN) 750 MG tablet TAKE ONE TABLET BY MOUTH EVERY 8 HOURS AS NEEDED FOR MUSCLE SPASMS Patient taking differently: Take 750 mg by mouth in the morning and at bedtime. 04/13/20  Yes Gottschalk, Ashly M, DO  mometasone-formoterol (DULERA) 100-5 MCG/ACT AERO Inhale 2 puffs into the lungs in the morning and at bedtime. 04/13/20  Yes Gottschalk, Leatrice Jewels M, DO  nitroGLYCERIN (NITROSTAT) 0.4 MG SL tablet Place 1 tablet (0.4 mg total) under the tongue  every 5 (five) minutes as needed. 06/10/19  Yes Gottschalk, Leatrice Jewels M, DO  omeprazole (PRILOSEC OTC) 20 MG tablet Take 20 mg by mouth daily.   Yes [provider]  pregabalin (LYRICA) 150 MG capsule Take 150mg  each morning and 300mg  each evening. Pharmacy: Cancel previous rx Patient taking differently: Take 150-300 mg by mouth See admin instructions. Take 150mg  each morning and 300mg  each evening. Pharmacy: Cancel previous rx 05/10/20  Yes Gottschalk, Ashly M, DO  rosuvastatin (CRESTOR) 5 MG tablet Take 1 tablet (5 mg total) by mouth at bedtime. 04/13/20  Yes Ronnie Doss M, DO  diclofenac sodium (VOLTAREN) 1 % GEL Apply 4 g topically 4 (four) times daily. Patient taking differently: Apply 4 g topically 4 (four) times daily as needed (pain). 12/23/18   [name], DO  hydrOXYzine (ATARAX/VISTARIL) 50 MG tablet TAKE 1/2 TO 1 TABLET BY MOUTH THREE TIMES DAILY AS NEEDED FOR ANXIETY (SLEEP) Patient taking differently: Take 25-50 mg by mouth See admin instructions. TAKE 1/2 TO 1 TABLET BY MOUTH THREE TIMES DAILY AS NEEDED FOR ANXIETY (SLEEP) 04/13/20   [name], DO    Allergies as of 05/18/2020 - Review Complete 05/18/2020  Allergen Reaction Noted  . Lipitor [atorvastatin] Other (See Comments) 10/13/2014  . Naproxen Nausea And Vomiting 06/04/2015    Family History  Problem Relation Age of Onset  . Coronary artery disease Father        States congenital abnormality  . Diabetes Father   . Hyperlipidemia Father   . CAD Sister        Stents   . Cancer Sister        breast  . Cancer Mother        breast  . Cancer Maternal Aunt        breast  . Cancer Maternal Grandmother        breast  . Anesthesia problems Neg Hx   . Hypotension Neg Hx   . Malignant hyperthermia Neg Hx   . Pseudochol deficiency Neg Hx     Social History   Socioeconomic History  . Marital status: Single    Spouse name: Not on file  . Number of children: Not on file  . Years of education: Not on file  . Highest education level: Not on file  Occupational History  . Not on file  Tobacco Use  . Smoking status: Current Every Day Smoker    Packs/day: 1.00    Years: 40.00    Pack years: 40.00    Types: Cigarettes  . Smokeless tobacco: Never Used  . Tobacco comment: pt quit smoking 2016 for 7 months but restarted last year  Vaping Use  . Vaping Use: Never used  Substance and Sexual Activity  . Alcohol use: No    Alcohol/week: 0.0 standard drinks  . Drug use: Yes    Frequency: 0.5 times per week    Types: Marijuana    Comment: weekly  . Sexual  activity: Yes    Birth control/protection: None, Post-menopausal  Other Topics Concern  . Not on file  Social History Narrative   The patient does not have any sex drive and has not had one for almost a year.   Her   husband thinks she does not love him anymore.  He does not seem to understand   her   depression.   Social Determinants of Health   Financial Resource Strain: Not on file  Food Insecurity: Not on file  Transportation  Needs: Not on file  Physical Activity: Not on file  Stress: Not on file  Social Connections: Not on file  Intimate Partner Violence: Not on file    Review of Systems: See HPI, otherwise negative ROS  Impression/Plan: Jamie Mcgee is here for a colonoscopy to be performed for colon cancer screening purposes.  The risks of the procedure including infection, bleed, or perforation as well as benefits, limitations, alternatives and imponderables have been reviewed with the patient. Questions have been answered. All parties agreeable.

## 2020-07-08 ENCOUNTER — Encounter (HOSPITAL_COMMUNITY): Payer: Self-pay | Admitting: Internal Medicine

## 2020-07-08 NOTE — Anesthesia Postprocedure Evaluation (Signed)
Anesthesia Post Note  Patient: AASIYA CREASEY  Procedure(s) Performed: COLONOSCOPY WITH PROPOFOL (N/A )     Patient location during evaluation: PACU Anesthesia Type: MAC Level of consciousness: awake and alert and oriented Pain management: pain level controlled Vital Signs Assessment: post-procedure vital signs reviewed and stable Respiratory status: spontaneous breathing, nonlabored ventilation and respiratory function stable Cardiovascular status: blood pressure returned to baseline Postop Assessment: no apparent nausea or vomiting Anesthetic complications: no   No complications documented.  Last Vitals:  Vitals:   07/06/20 1400 07/06/20 1409  BP: 109/78 113/68  Pulse: 71   Resp: 20   Temp:    SpO2: 97%     Last Pain:  Vitals:   07/06/20 1409  TempSrc:   PainSc: 0-No pain                 Brennan Bailey

## 2020-07-19 ENCOUNTER — Other Ambulatory Visit: Payer: Self-pay | Admitting: Family Medicine

## 2020-07-19 DIAGNOSIS — M542 Cervicalgia: Secondary | ICD-10-CM

## 2020-07-19 DIAGNOSIS — G8929 Other chronic pain: Secondary | ICD-10-CM

## 2020-08-09 ENCOUNTER — Other Ambulatory Visit: Payer: Self-pay | Admitting: Family Medicine

## 2020-08-09 DIAGNOSIS — M5442 Lumbago with sciatica, left side: Secondary | ICD-10-CM

## 2020-08-09 DIAGNOSIS — G8929 Other chronic pain: Secondary | ICD-10-CM

## 2020-09-06 ENCOUNTER — Encounter (INDEPENDENT_AMBULATORY_CARE_PROVIDER_SITE_OTHER): Payer: Medicare Other | Admitting: Ophthalmology

## 2020-09-07 ENCOUNTER — Encounter (INDEPENDENT_AMBULATORY_CARE_PROVIDER_SITE_OTHER): Payer: Medicare Other | Admitting: Ophthalmology

## 2020-09-07 ENCOUNTER — Other Ambulatory Visit: Payer: Self-pay

## 2020-09-07 DIAGNOSIS — H3560 Retinal hemorrhage, unspecified eye: Secondary | ICD-10-CM | POA: Diagnosis not present

## 2020-09-07 DIAGNOSIS — I10 Essential (primary) hypertension: Secondary | ICD-10-CM

## 2020-09-07 DIAGNOSIS — H35033 Hypertensive retinopathy, bilateral: Secondary | ICD-10-CM | POA: Diagnosis not present

## 2020-09-07 DIAGNOSIS — H43813 Vitreous degeneration, bilateral: Secondary | ICD-10-CM | POA: Diagnosis not present

## 2020-10-05 ENCOUNTER — Other Ambulatory Visit: Payer: Self-pay | Admitting: Family Medicine

## 2020-10-05 ENCOUNTER — Other Ambulatory Visit: Payer: Self-pay

## 2020-10-05 ENCOUNTER — Other Ambulatory Visit: Payer: Medicare Other

## 2020-10-05 DIAGNOSIS — I10 Essential (primary) hypertension: Secondary | ICD-10-CM

## 2020-10-05 NOTE — Progress Notes (Signed)
error 

## 2020-10-06 LAB — CBC WITH DIFFERENTIAL/PLATELET
Basophils Absolute: 0.1 10*3/uL (ref 0.0–0.2)
Basos: 1 %
EOS (ABSOLUTE): 0.1 10*3/uL (ref 0.0–0.4)
Eos: 1 %
Hematocrit: 50.3 % — ABNORMAL HIGH (ref 34.0–46.6)
Hemoglobin: 16.8 g/dL — ABNORMAL HIGH (ref 11.1–15.9)
Immature Grans (Abs): 0 10*3/uL (ref 0.0–0.1)
Immature Granulocytes: 0 %
Lymphocytes Absolute: 3.2 10*3/uL — ABNORMAL HIGH (ref 0.7–3.1)
Lymphs: 36 %
MCH: 29.9 pg (ref 26.6–33.0)
MCHC: 33.4 g/dL (ref 31.5–35.7)
MCV: 90 fL (ref 79–97)
Monocytes Absolute: 0.6 10*3/uL (ref 0.1–0.9)
Monocytes: 7 %
Neutrophils Absolute: 5 10*3/uL (ref 1.4–7.0)
Neutrophils: 55 %
Platelets: 212 10*3/uL (ref 150–450)
RBC: 5.62 x10E6/uL — ABNORMAL HIGH (ref 3.77–5.28)
RDW: 14 % (ref 11.7–15.4)
WBC: 8.9 10*3/uL (ref 3.4–10.8)

## 2020-10-06 LAB — CMP14+EGFR
ALT: 20 IU/L (ref 0–32)
AST: 21 IU/L (ref 0–40)
Albumin/Globulin Ratio: 1.5 (ref 1.2–2.2)
Albumin: 4.3 g/dL (ref 3.8–4.9)
Alkaline Phosphatase: 121 IU/L (ref 44–121)
BUN/Creatinine Ratio: 7 — ABNORMAL LOW (ref 12–28)
BUN: 5 mg/dL — ABNORMAL LOW (ref 8–27)
Bilirubin Total: 0.3 mg/dL (ref 0.0–1.2)
CO2: 27 mmol/L (ref 20–29)
Calcium: 9.3 mg/dL (ref 8.7–10.3)
Chloride: 91 mmol/L — ABNORMAL LOW (ref 96–106)
Creatinine, Ser: 0.75 mg/dL (ref 0.57–1.00)
Globulin, Total: 2.8 g/dL (ref 1.5–4.5)
Glucose: 107 mg/dL — ABNORMAL HIGH (ref 65–99)
Potassium: 3.7 mmol/L (ref 3.5–5.2)
Sodium: 135 mmol/L (ref 134–144)
Total Protein: 7.1 g/dL (ref 6.0–8.5)
eGFR: 91 mL/min/{1.73_m2} (ref >59)

## 2020-10-11 ENCOUNTER — Telehealth: Payer: Self-pay

## 2020-10-11 ENCOUNTER — Other Ambulatory Visit (HOSPITAL_COMMUNITY)
Admission: RE | Admit: 2020-10-11 | Discharge: 2020-10-11 | Disposition: A | Discharge status: Home / Self Care | Payer: Medicare Other | Source: Ambulatory Visit | Attending: Family Medicine | Admitting: Family Medicine

## 2020-10-11 ENCOUNTER — Other Ambulatory Visit: Payer: Self-pay

## 2020-10-11 ENCOUNTER — Ambulatory Visit (INDEPENDENT_AMBULATORY_CARE_PROVIDER_SITE_OTHER): Payer: Medicare Other | Admitting: Family Medicine

## 2020-10-11 ENCOUNTER — Encounter: Payer: Self-pay | Admitting: Family Medicine

## 2020-10-11 VITALS — BP 116/74 | HR 67 | Temp 97.7°F | Ht 64.0 in | Wt 205.8 lb

## 2020-10-11 DIAGNOSIS — R87810 Cervical high risk human papillomavirus (HPV) DNA test positive: Secondary | ICD-10-CM | POA: Diagnosis not present

## 2020-10-11 DIAGNOSIS — Z0000 Encounter for general adult medical examination without abnormal findings: Secondary | ICD-10-CM

## 2020-10-11 DIAGNOSIS — N939 Abnormal uterine and vaginal bleeding, unspecified: Secondary | ICD-10-CM

## 2020-10-11 DIAGNOSIS — F17200 Nicotine dependence, unspecified, uncomplicated: Secondary | ICD-10-CM

## 2020-10-11 DIAGNOSIS — Z1151 Encounter for screening for human papillomavirus (HPV): Secondary | ICD-10-CM | POA: Diagnosis not present

## 2020-10-11 DIAGNOSIS — Z124 Encounter for screening for malignant neoplasm of cervix: Secondary | ICD-10-CM | POA: Diagnosis present

## 2020-10-11 DIAGNOSIS — Z0001 Encounter for general adult medical examination with abnormal findings: Secondary | ICD-10-CM

## 2020-10-11 DIAGNOSIS — E8881 Metabolic syndrome: Secondary | ICD-10-CM

## 2020-10-11 DIAGNOSIS — M5442 Lumbago with sciatica, left side: Secondary | ICD-10-CM

## 2020-10-11 DIAGNOSIS — M8589 Other specified disorders of bone density and structure, multiple sites: Secondary | ICD-10-CM

## 2020-10-11 DIAGNOSIS — R0989 Other specified symptoms and signs involving the circulatory and respiratory systems: Secondary | ICD-10-CM

## 2020-10-11 DIAGNOSIS — R198 Other specified symptoms and signs involving the digestive system and abdomen: Secondary | ICD-10-CM | POA: Diagnosis not present

## 2020-10-11 DIAGNOSIS — Z23 Encounter for immunization: Secondary | ICD-10-CM

## 2020-10-11 DIAGNOSIS — E559 Vitamin D deficiency, unspecified: Secondary | ICD-10-CM

## 2020-10-11 DIAGNOSIS — R7303 Prediabetes: Secondary | ICD-10-CM

## 2020-10-11 DIAGNOSIS — I10 Essential (primary) hypertension: Secondary | ICD-10-CM | POA: Diagnosis not present

## 2020-10-11 DIAGNOSIS — G8929 Other chronic pain: Secondary | ICD-10-CM

## 2020-10-11 DIAGNOSIS — H9193 Unspecified hearing loss, bilateral: Secondary | ICD-10-CM

## 2020-10-11 DIAGNOSIS — R7989 Other specified abnormal findings of blood chemistry: Secondary | ICD-10-CM

## 2020-10-11 DIAGNOSIS — R09A2 Foreign body sensation, throat: Secondary | ICD-10-CM

## 2020-10-11 DIAGNOSIS — R252 Cramp and spasm: Secondary | ICD-10-CM

## 2020-10-11 DIAGNOSIS — M5441 Lumbago with sciatica, right side: Secondary | ICD-10-CM

## 2020-10-11 LAB — BAYER DCA HB A1C WAIVED: HB A1C (BAYER DCA - WAIVED): 5.6 % (ref <7.0)

## 2020-10-11 MED ORDER — PREGABALIN 150 MG PO CAPS
ORAL_CAPSULE | ORAL | 1 refills | Status: DC
Start: 2020-10-11 — End: 2021-04-13

## 2020-10-11 NOTE — Telephone Encounter (Signed)
Left message informing pt that she should have refills until Nov this year. Asked pt to contact her pharmacy.

## 2020-10-11 NOTE — Progress Notes (Signed)
Jamie Mcgee is a 60 y.o. female presents to office today for annual physical exam examination.    Concerns today include: 1.  Abnormal uterine bleeding Patient with ongoing abnormal uterine bleeding which she describes as spotting.  She has had work-up in 2016 for this which was unremarkable at that time.  However, given ongoing intermittent bleeding and family history of pelvic cancers she wanted to have this rechecked.  Previous Pap smears have been normal.  No unplanned weight loss, night sweats.  She does have history of hemorrhoids  2.  Obesity Patient with previous prediabetic range blood sugars.  She does not have any structured physical exercise.  She would like to get A1c checked today.  History of vitamin D deficiency.  No recent check.  Lipid panel was obtained in August and was normal  3.  Tobacco use disorder Patient smokes just over a pack per day and has done so since age 23.  No hemoptysis, unplanned weight loss, change in voice or sore throat.  She would like to proceed with lung cancer screening  4.  Chronic back pain Patient reports good control symptoms with Lyrica 150 in the morning and 300 at bedtime.  No excessive daytime sedation, falls, respiratory depression.  Diet: Fair, Exercise: No structured Last eye exam: Up-to-date.  Sees Dr. Theodosia Blender office in [place] Last colonoscopy: Up-to-date Last mammogram: Up-to-date Last pap smear: Needs Refills needed today: Lyrica Immunizations needed: Shingles vaccine needed  Immunization History  Administered Date(s) Administered  . Influenza,inj,Quad PF,6+ Mos 03/15/2018, 03/10/2020  . Moderna Sars-Covid-2 Vaccination 10/06/2019  . Pneumococcal Polysaccharide-23 01/04/2018  . Tdap 01/04/2018  . Zoster Recombinat (Shingrix) 10/11/2020    Past Medical History:  Diagnosis Date  . Anxiety   . Arthritis   . Carpal tunnel syndrome on right   . Cervical stenosis of spine    C5-6  . COPD (chronic obstructive pulmonary  disease) ([place])    Gold III  . Depression   . Fibromuscular dysplasia of renal artery ([place]) 2006  . GERD (gastroesophageal reflux disease)   . Headache    migraines 20 years ago  . Hepatitis    type A in 1977  . HNP (herniated nucleus pulposus), cervical   . HTN (hypertension)   . Neuropathy    Social History   Socioeconomic History  . Marital status: Single    Spouse name: Not on file  . Number of children: Not on file  . Years of education: Not on file  . Highest education level: Not on file  Occupational History  . Not on file  Tobacco Use  . Smoking status: Current Every Day Smoker    Packs/day: 1.00    Years: 40.00    Pack years: 40.00    Types: Cigarettes  . Smokeless tobacco: Never Used  . Tobacco comment: pt quit smoking 2016 for 7 months but restarted last year  Vaping Use  . Vaping Use: Never used  Substance and Sexual Activity  . Alcohol use: No    Alcohol/week: 0.0 standard drinks  . Drug use: Yes    Frequency: 0.5 times per week    Types: Marijuana    Comment: weekly  . Sexual activity: Yes    Birth control/protection: None, Post-menopausal  Other Topics Concern  . Not on file  Social History Narrative   The patient does not have any sex drive and has not had one for almost a year.   Her   husband thinks she does  not love him anymore.  He does not seem to understand   her   depression.   Social Determinants of Health   Financial Resource Strain: Not on file  Food Insecurity: Not on file  Transportation Needs: Not on file  Physical Activity: Not on file  Stress: Not on file  Social Connections: Not on file  Intimate Partner Violence: Not on file   Past Surgical History:  Procedure Laterality Date  . ANTERIOR CERVICAL DECOMP/DISCECTOMY FUSION N/A 10/05/2017   Procedure: C5-6 ANTERIOR CERVICAL DECOMPRESSION/DISCECTOMY FUSION,  ALLOGRAFT, PLATE  (NECK FIRST AND CARPAL TUNNEL RELEASE SECOND);  Surgeon: Marybelle Killings, MD;  Location: [facility];  Service:  Orthopedics;  Laterality: N/A;  . APPENDECTOMY  1993  . BREAST BIOPSY Right   . CARPAL TUNNEL RELEASE Right 10/05/2017   Procedure: RIGHT CARPAL TUNNEL RELEASE;  Surgeon: Marybelle Killings, MD;  Location: [facility];  Service: Orthopedics;  Laterality: Right;  . CATARACT EXTRACTION  08-2010   left   . CATARACT EXTRACTION W/PHACO  06/01/2011   Procedure: CATARACT EXTRACTION PHACO AND INTRAOCULAR LENS PLACEMENT (IOC);  Surgeon: Tonny Branch;  Location: [facility];  Service: Ophthalmology;  Laterality: Right;  CDE:9.62  . CHOLECYSTECTOMY    . COLONOSCOPY WITH PROPOFOL N/A 07/06/2020   Procedure: COLONOSCOPY WITH PROPOFOL;  Surgeon: Eloise Harman, DO;  [facility];  Service: Endoscopy;  Laterality: N/A;  11:00am  . RENAL ARTERY ANGIOPLASTY  2006   right  . TUBAL LIGATION  1984   [facility]   Family History  Problem Relation Age of Onset  . Coronary artery disease Father        States congenital abnormality  . Diabetes Father   . Hyperlipidemia Father   . CAD Sister        Stents   . Cancer Sister        breast  . Cancer Mother        breast  . Cancer Maternal Aunt        breast  . Cancer Maternal Grandmother        breast  . Anesthesia problems Neg Hx   . Hypotension Neg Hx   . Malignant hyperthermia Neg Hx   . Pseudochol deficiency Neg Hx     Current Outpatient Medications:  .  methocarbamol (ROBAXIN) 750 MG tablet, TAKE ONE TABLET BY MOUTH EVERY 8 HOURS AS NEEDED FOR MUSCLE SPASMS, Disp: 90 tablet, Rfl: 2 .  busPIRone (BUSPAR) 10 MG tablet, Take 1 tablet (10 mg total) by mouth 3 (three) times daily. (Patient taking differently: Take 10 mg by mouth 2 (two) times daily.), Disp: 270 tablet, Rfl: 3 .  carvedilol (COREG) 6.25 MG tablet, Take 1 tablet (6.25 mg total) by mouth 2 (two) times daily with a meal., Disp: 180 tablet, Rfl: 3 .  citalopram (CELEXA) 40 MG tablet, Take 1 tablet (40 mg total) by mouth daily., Disp: 90 tablet, Rfl: 3 .  diclofenac sodium (VOLTAREN) 1 % GEL, Apply 4 g  topically 4 (four) times daily. (Patient taking differently: Apply 4 g topically 4 (four) times daily as needed (pain).), Disp: 400 g, Rfl: 3 .  fexofenadine (ALLEGRA) 180 MG tablet, Take 180 mg by mouth daily., Disp: , Rfl:  .  hydrochlorothiazide (HYDRODIURIL) 25 MG tablet, Take 1 tablet (25 mg total) by mouth daily., Disp: 90 tablet, Rfl: 3 .  hydrOXYzine (ATARAX/VISTARIL) 50 MG tablet, TAKE 1/2 TO 1 TABLET BY MOUTH THREE TIMES DAILY AS NEEDED FOR ANXIETY (SLEEP) (Patient taking  differently: Take 25-50 mg by mouth See admin instructions. TAKE 1/2 TO 1 TABLET BY MOUTH THREE TIMES DAILY AS NEEDED FOR ANXIETY (SLEEP)), Disp: 90 tablet, Rfl: 3 .  losartan (COZAAR) 100 MG tablet, Take 1 tablet (100 mg total) by mouth daily., Disp: 90 tablet, Rfl: 3 .  mometasone-formoterol (DULERA) 100-5 MCG/ACT AERO, Inhale 2 puffs into the lungs in the morning and at bedtime., Disp: 3 each, Rfl: 3 .  nitroGLYCERIN (NITROSTAT) 0.4 MG SL tablet, Place 1 tablet (0.4 mg total) under the tongue every 5 (five) minutes as needed., Disp: 25 tablet, Rfl: 3 .  omeprazole (PRILOSEC OTC) 20 MG tablet, Take 20 mg by mouth daily., Disp: , Rfl:  .  pregabalin (LYRICA) 150 MG capsule, Take 150mg  each morning and 300mg  each evening. Pharmacy: Cancel previous rx (Patient taking differently: Take 150-300 mg by mouth See admin instructions. Take 150mg  each morning and 300mg  each evening. Pharmacy: Cancel previous rx), Disp: 270 capsule, Rfl: 1 .  rosuvastatin (CRESTOR) 5 MG tablet, Take 1 tablet (5 mg total) by mouth at bedtime., Disp: 90 tablet, Rfl: 3  Allergies  Allergen Reactions  . Lipitor [Atorvastatin] Other (See Comments)    Muscle aches and nausea  . Naproxen Nausea And Vomiting    Stomach pain     ROS: Review of Systems Pertinent items noted in HPI and remainder of comprehensive ROS otherwise negative.    Physical exam BP 116/74   Pulse 67   Temp 97.7 F (36.5 C)   Ht 5\' 4"  (1.626 m)   Wt 205 lb 12.8 oz (93.4 kg)    LMP 05/25/2011   SpO2 98%   BMI 35.33 kg/m  General appearance: alert, cooperative, appears stated age, no distress and morbidly obese Head: Normocephalic, without obvious abnormality, atraumatic Eyes: negative findings: conjunctivae and sclerae normal, corneas clear and pupils equal, round, reactive to light and accomodation Ears: normal TM's and external ear canals both ears Nose: Nares normal. Septum midline. Mucosa normal. No drainage or sinus tenderness. Throat: Evidence of previous dental work noted.  No oropharyngeal or sublingual masses noted Neck: no adenopathy, supple, symmetrical, trachea midline and thyroid not enlarged, symmetric, no tenderness/mass/nodules Back: symmetric, no curvature. ROM normal. No CVA tenderness. Lungs: clear to auscultation bilaterally Heart: regular rate and rhythm, S1, S2 normal, no murmur, click, rub or gallop Abdomen: soft, non-tender; bowel sounds normal; no masses,  no organomegaly Pelvic: cervix normal in appearance, external genitalia normal, no adnexal masses or tenderness, no cervical motion tenderness, rectovaginal septum normal, uterus normal size, shape, and consistency and vagina normal without discharge Extremities: extremities normal, atraumatic, no cyanosis or edema Pulses: 2+ and symmetric Skin: Skin color, texture, turgor normal. No rashes or lesions Lymph nodes: Cervical, supraclavicular, and axillary nodes normal. Neurologic: Alert and oriented X 3, normal strength and tone. Normal symmetric reflexes. Normal coordination and gait Psych: Mood stable, speech normal, affect appropriate   Assessment/ Plan: Edison Pace here for annual physical exam.   Annual physical exam  Screening for malignant neoplasm of cervix - Plan: Cytology - PAP  Abnormal uterine bleeding - Plan: US Pelvic Complete With Transvaginal, Ambulatory referral to Gynecology  Essential hypertension, benign  Chronic bilateral low back pain with bilateral  sciatica - Plan: pregabalin (LYRICA) 150 MG capsule  Morbid obesity ([place])  Pre-diabetes - Plan: Bayer DCA Hb T0W Waived  Metabolic syndrome - Plan: Bayer DCA Hb A1c Waived  Osteopenia of multiple sites  Vitamin D deficiency - Plan: VITAMIN D 25 Hydroxy (  Vit-D Deficiency, Fractures)  Abnormal CBC - Plan: CBC  Tobacco use disorder - Plan: CT CHEST LUNG CA SCREEN LOW DOSE W/O CM  Globus sensation  Hearing difficulty of both ears - Plan: Ambulatory referral to Audiology  Pap obtained given ongoing abnormal uterine bleeding.  I reviewed her previous pelvic ultrasound from June 2016 which showed benign etiology like endometrial atrophy.  I reviewed her 2017 notes with Dr. Glo Herring who had performed an endometrial biopsy at that time.  The biopsy showed no evidence of malignancy nor active endometrium.  She has not been reevaluated since that time.  She does have family history of intra-abdominal cancers and she wanted this further evaluated.  We will obtain repeat pelvic ultrasound and refer accordingly.  Blood pressure is at goal.  Continue current regimen.  Most recent labs were reviewed and elevation in hemoglobin hematocrit were noted.  This is likely in the setting of ongoing tobacco use.  She is otherwise asymptomatic.  CT lung cancer screening ordered today.  She has a greater than 45-year pack history.  Globus sensation is likely secondary to uncontrolled GERD but we discussed potential for esophageal stricture and if symptoms do not improve with increased PPI frequency would have her see GI.  She will contact me if this is needed  She reported difficulty hearing and I placed a referral to audiology for evaluation  A1c and vitamin D labs were added to today's laboratory collection given morbid obesity and history of vitamin D deficiency which has not been reevaluated in the last few years.  Doing well on Lyrica 150 every morning and 300 every afternoon.  No red flags.  National  narcotic database was reviewed.  UDS and CSA are up-to-date.  Med renewed.  Follow-up in 6 months, sooner if needed  Counseled on healthy lifestyle choices, including diet (rich in fruits, vegetables and lean meats and low in salt and simple carbohydrates) and exercise (at least 30 minutes of moderate physical activity daily).  Taquanna Borras M. Lajuana Ripple, DO

## 2020-10-11 NOTE — Patient Instructions (Addendum)
Ok to increase your Omeprazole to twice daily for the next few weeks.  If this helps with that swallowing problem, let me know.  Otherwise, would recommend recheck with GI to look for narrowing.  CT ordered for lung cancer screening  Referral placed for hearing. If no call in 1 week, call and ask for Courtney  Ultrasound of uterus ordered  Shingles shot given. Return in 2 months for repeat injection.  You had labs performed today.  You will be contacted with the results of the labs once they are available, usually in the next 3 business days for routine lab work.  If you have an active my chart account, they will be released to your MyChart.  If you prefer to have these labs released to you via telephone, please let us know.  If you had a pap smear or biopsy performed, expect to be contacted in about 7-10 days.   Preventive Care 101-15 Years Old, Female Preventive care refers to lifestyle choices and visits with your health care provider that can promote health and wellness. This includes:  A yearly physical exam. This is also called an annual wellness visit.  Regular dental and eye exams.  Immunizations.  Screening for certain conditions.  Healthy lifestyle choices, such as: ? Eating a healthy diet. ? Getting regular exercise. ? Not using drugs or products that contain nicotine and tobacco. ? Limiting alcohol use. What can I expect for my preventive care visit? Physical exam Your health care provider will check your:  Height and weight. These may be used to calculate your BMI (body mass index). BMI is a measurement that tells if you are at a healthy weight.  Heart rate and blood pressure.  Body temperature.  Skin for abnormal spots. Counseling Your health care provider may ask you questions about your:  Past medical problems.  Family's medical history.  Alcohol, tobacco, and drug use.  Emotional well-being.  Home life and relationship well-being.  Sexual  activity.  Diet, exercise, and sleep habits.  Work and work Statistician.  Access to firearms.  Method of birth control.  Menstrual cycle.  Pregnancy history. What immunizations do I need? Vaccines are usually given at various ages, according to a schedule. Your health care provider will recommend vaccines for you based on your age, medical history, and lifestyle or other factors, such as travel or where you work.   What tests do I need? Blood tests  Lipid and cholesterol levels. These may be checked every 5 years, or more often if you are over 53 years old.  Hepatitis C test.  Hepatitis B test. Screening  Lung cancer screening. You may have this screening every year starting at age 79 if you have a 30-pack-year history of smoking and currently smoke or have quit within the past 15 years.  Colorectal cancer screening. ? All adults should have this screening starting at age 8 and continuing until age 71. ? Your health care provider may recommend screening at age 2 if you are at increased risk. ? You will have tests every 1-10 years, depending on your results and the type of screening test.  Diabetes screening. ? This is done by checking your blood sugar (glucose) after you have not eaten for a while (fasting). ? You may have this done every 1-3 years.  Mammogram. ? This may be done every 1-2 years. ? Talk with your health care provider about when you should start having regular mammograms. This may depend on whether you  have a family history of breast cancer.  BRCA-related cancer screening. This may be done if you have a family history of breast, ovarian, tubal, or peritoneal cancers.  Pelvic exam and Pap test. ? This may be done every 3 years starting at age 34. ? Starting at age 28, this may be done every 5 years if you have a Pap test in combination with an HPV test. Other tests  STD (sexually transmitted disease) testing, if you are at risk.  Bone density scan.  This is done to screen for osteoporosis. You may have this scan if you are at high risk for osteoporosis. Talk with your health care provider about your test results, treatment options, and if necessary, the need for more tests. Follow these instructions at home: Eating and drinking  Eat a diet that includes fresh fruits and vegetables, whole grains, lean protein, and low-fat dairy products.  Take vitamin and mineral supplements as recommended by your health care provider.  Do not drink alcohol if: ? Your health care provider tells you not to drink. ? You are pregnant, may be pregnant, or are planning to become pregnant.  If you drink alcohol: ? Limit how much you have to 0-1 drink a day. ? Be aware of how much alcohol is in your drink. In the [place] one drink equals one 12 oz bottle of beer (355 mL), one 5 oz glass of wine (148 mL), or one 1 oz glass of hard liquor (44 mL).   Lifestyle  Take daily care of your teeth and gums. Brush your teeth every morning and night with fluoride toothpaste. Floss one time each day.  Stay active. Exercise for at least 30 minutes 5 or more days each week.  Do not use any products that contain nicotine or tobacco, such as cigarettes, e-cigarettes, and chewing tobacco. If you need help quitting, ask your health care provider.  Do not use drugs.  If you are sexually active, practice safe sex. Use a condom or other form of protection to prevent STIs (sexually transmitted infections).  If you do not wish to become pregnant, use a form of birth control. If you plan to become pregnant, see your health care provider for a prepregnancy visit.  If told by your health care provider, take low-dose aspirin daily starting at age 24.  Find healthy ways to cope with stress, such as: ? Meditation, yoga, or listening to music. ? Journaling. ? Talking to a trusted person. ? Spending time with friends and family. Safety  Always wear your seat belt while driving or  riding in a vehicle.  Do not drive: ? If you have been drinking alcohol. Do not ride with someone who has been drinking. ? When you are tired or distracted. ? While texting.  Wear a helmet and other protective equipment during sports activities.  If you have firearms in your house, make sure you follow all gun safety procedures. What's next?  Visit your health care provider once a year for an annual wellness visit.  Ask your health care provider how often you should have your eyes and teeth checked.  Stay up to date on all vaccines. This information is not intended to replace advice given to you by your health care provider. Make sure you discuss any questions you have with your health care provider. Document Revised: 02/24/2020 Document Reviewed: 01/31/2018 Elsevier Patient Education  2021 [employer].

## 2020-10-11 NOTE — Addendum Note (Signed)
Addended by: [name] on: 10/11/2020 02:10 PM   Modules accepted: Orders

## 2020-10-12 ENCOUNTER — Other Ambulatory Visit: Payer: Self-pay | Admitting: Family Medicine

## 2020-10-12 DIAGNOSIS — E559 Vitamin D deficiency, unspecified: Secondary | ICD-10-CM

## 2020-10-12 LAB — CBC
Hematocrit: 46.2 % (ref 34.0–46.6)
Hemoglobin: 16.1 g/dL — ABNORMAL HIGH (ref 11.1–15.9)
MCH: 30.7 pg (ref 26.6–33.0)
MCHC: 34.8 g/dL (ref 31.5–35.7)
MCV: 88 fL (ref 79–97)
Platelets: 218 10*3/uL (ref 150–450)
RBC: 5.25 x10E6/uL (ref 3.77–5.28)
RDW: 13.2 % (ref 11.7–15.4)
WBC: 12.5 10*3/uL — ABNORMAL HIGH (ref 3.4–10.8)

## 2020-10-12 LAB — CYTOLOGY - PAP
Adequacy: ABSENT
Comment: NEGATIVE
Diagnosis: NEGATIVE
High risk HPV: POSITIVE — AB

## 2020-10-12 LAB — VITAMIN D 25 HYDROXY (VIT D DEFICIENCY, FRACTURES): Vit D, 25-Hydroxy: 12 ng/mL — ABNORMAL LOW (ref 30.0–100.0)

## 2020-10-12 MED ORDER — CHOLECALCIFEROL 1.25 MG (50000 UT) PO CAPS: 50000.0000 [IU] | ORAL_CAPSULE | ORAL | 0 refills | Status: AC

## 2020-10-13 LAB — SPECIMEN STATUS REPORT

## 2020-10-13 LAB — MAGNESIUM: Magnesium: 1.9 mg/dL (ref 1.6–2.3)

## 2020-10-20 ENCOUNTER — Other Ambulatory Visit: Payer: Self-pay

## 2020-10-20 ENCOUNTER — Ambulatory Visit (HOSPITAL_COMMUNITY)
Admission: RE | Admit: 2020-10-20 | Discharge: 2020-10-20 | Disposition: A | Discharge status: Home / Self Care | Payer: Medicare Other | Source: Ambulatory Visit | Attending: Family Medicine | Admitting: Family Medicine

## 2020-10-20 DIAGNOSIS — N939 Abnormal uterine and vaginal bleeding, unspecified: Secondary | ICD-10-CM | POA: Insufficient documentation

## 2020-10-25 ENCOUNTER — Encounter (HOSPITAL_COMMUNITY): Payer: Self-pay

## 2020-10-25 NOTE — Progress Notes (Signed)
Lung Cancer Screening Referral received. Unable to reach patient at this time, detailed VM left asking that the patient return my phone call.

## 2020-10-26 ENCOUNTER — Encounter (HOSPITAL_COMMUNITY): Payer: Self-pay

## 2020-10-26 NOTE — Progress Notes (Signed)
Received referral for initial lung cancer screening scan. Contacted patient and obtained smoking history (started age 60 smoking 1/2PPD until the age of 25 when she increased to 1PPD, current smoker continuing to smoke 1PPD, 44.5 pack year history) as well as answering questions related to the screening process. Patient denies signs/symptoms of lung cancer such as weight loss or hemoptysis. Patient denies comorbidity that would prevent curative treatment if lung cancer were to be found. Patient to be scheduled for [facility] and LDCT.

## 2020-10-28 ENCOUNTER — Other Ambulatory Visit: Payer: Self-pay

## 2020-10-28 ENCOUNTER — Ambulatory Visit: Payer: Medicare Other | Admitting: Adult Health

## 2020-10-28 ENCOUNTER — Encounter: Payer: Self-pay | Admitting: Adult Health

## 2020-10-28 ENCOUNTER — Other Ambulatory Visit (HOSPITAL_COMMUNITY): Payer: Self-pay

## 2020-10-28 VITALS — BP 149/81 | HR 70 | Ht 64.0 in | Wt 208.5 lb

## 2020-10-28 DIAGNOSIS — R87810 Cervical high risk human papillomavirus (HPV) DNA test positive: Secondary | ICD-10-CM

## 2020-10-28 DIAGNOSIS — N950 Postmenopausal bleeding: Secondary | ICD-10-CM | POA: Insufficient documentation

## 2020-10-28 DIAGNOSIS — Z87891 Personal history of nicotine dependence: Secondary | ICD-10-CM

## 2020-10-28 DIAGNOSIS — Z122 Encounter for screening for malignant neoplasm of respiratory organs: Secondary | ICD-10-CM

## 2020-10-28 NOTE — Progress Notes (Addendum)
Subjective:     Patient ID: Jamie Mcgee, female   DOB: 24-Sep-1960, 60 y.o.   MRN: [id number]  HPI Jamie Mcgee is a 60 year old white female, divorced, PM in for PMB, she has had on and off for over 2 years now, usually when wipes and may have cramping like period cramps. She Had [place] 10/20/20 and EEC was 4 mm,ovaries normal and uterus is small. She had pap 10/11/20 that was normal but +HPV, so will need pap in 1 year. She had an endometrial biopsy in 2017 that was benign. I reviewed all this with her.  PCP is Dr Lajuana Ripple.  Review of Systems PMB Menstrual like cramps She is not sexually active Has difficulty peeing at times,has to re arrange on toilet  Reviewed past medical,surgical, social and family history. Reviewed medications and allergies.     Objective:   Physical Exam BP (!) 149/81 (BP Location: Right Arm, Patient Position: Sitting, Cuff Size: Large)   Pulse 70   Ht 5\' 4"  (1.626 m)   Wt 208 lb 8 oz (94.6 kg)   LMP 05/25/2011   BMI 35.79 kg/m    Skin warm and dry.Pelvic: external genitalia is normal in appearance no lesions, vagina: pale with loss of moisture and rugae, has good support,urethra has no lesions or masses noted, cervix:smooth, uterus: normal size, shape and contour, non tender, no masses felt, adnexa: no masses or tenderness noted. Bladder is non tender and no masses felt.AA is 0 Fall risk is low Depression screen [facility] 2/9 10/28/2020 10/11/2020 04/13/2020  Decreased Interest 0 0 0  Down, Depressed, Hopeless 1 0 0  PHQ - 2 Score 1 0 0  Altered sleeping 1 - -  Tired, decreased energy 1 - -  Change in appetite 1 - -  Feeling bad or failure about yourself  0 - -  Trouble concentrating 1 - -  Moving slowly or fidgety/restless 0 - -  Suicidal thoughts 0 - -  PHQ-9 Score 5 - -  Difficult doing work/chores - - -  Some recent data might be hidden   GAD 7 : Generalized Anxiety Score 10/28/2020 02/10/2020 01/09/2020 01/07/2020  Nervous, Anxious, on Edge 0 1 0 1  Control/stop  worrying 0 0 0 1  Worry too much - different things 0 0 0 1  Trouble relaxing 1 1 1 1   Restless 0 0 1 1  Easily annoyed or irritable 0 2 0 1  Afraid - awful might happen 0 1 0 1  Total GAD 7 Score 1 5 2 7   Anxiety Difficulty - Not difficult at all Not difficult at all Somewhat difficult    Upstream - 10/28/20 1416      Pregnancy Intention Screening   Does the patient want to become pregnant in the next year? N/A    Does the patient's partner want to become pregnant in the next year? N/A    Would the patient like to discuss contraceptive options today? N/A      Contraception Wrap Up   Current Method No Method - Other Reason   postmenopausal   End Method No Method - Other Reason   postmenopausal   Contraception Counseling Provided No         Examination chaperoned by Levy Pupa LPN  Assessment:     1. PMB (postmenopausal bleeding) Will get appointment with Dr Elonda Husky for possible endometrial biopsy, also discussed with her that she may need cyclic progestin   2. Papanicolaou smear of cervix with  positive high risk human papilloma virus (HPV) test Repeat pap in 1 year per ASCCP guidelines, 5 year risk for CIN3+ is 2.25 %     Plan:     See Dr Elonda Husky 11/16/20

## 2020-10-28 NOTE — Progress Notes (Signed)
Patient scheduled for [facility] and LDCT on 06/09

## 2020-11-10 NOTE — Progress Notes (Signed)
Jamie Mcgee  Visit Date: 11/11/2020 Visit Type: In-Person at [place] SHARED DECISION-MAKING VISIT - Age: 60 y.o.  - Pack year smoking history: 67.5 pack-years (1.5 PPD x 45 years) - Type of tobacco abuse: Cigarettes - Current smoker or < 15 years of cessation: Current smoker - No current symptoms of lung cancer:   Patient denies any hemoptysis, unintentional weight loss, and unexplained cough.  Chronic cough is due to COPD.  - Risks and benefits of lung cancer screening discussed: Negative: Over-diagnosis, radiation exposure, false positives, and additional testing Positive: Discover early stage lung cancer resulting in higher incidence of cure - Patient educated regarding the importance of adherence to continued lung cancer screening. - Currently, there are no co-morbidities to prevent treatment to therapy for lung cancer and the patient is agreeable to pursue treatment if a malignancy is discovered.  [place] Preventative Services Task Force recommend annual screening for lung cancer with low-dose CT in adults aged 41 - 61 years who have a 20+ pack year smoking history and currently smoke or have quit smoking within the past 15 years.  Screening should be discontinued once a person has not smoked for 15 years or develops a health problem that substantially limits life expectancy or the ability or willingness to have curative lung surgery.  It is a category B recommendation.  Similar stances are provided by CMS, NCCN, and AATS.  Social History   Tobacco Use   Smoking status: Current Every Day Smoker    Packs/day: 1.00    Years: 40.00    Pack years: 40.00    Types: Cigarettes   Smokeless tobacco: Never Used   Tobacco comment: pt quit smoking 2016 for 7 months but restarted last year  Vaping Use   Vaping Use: Never used  Substance Use Topics   Alcohol use: No    Alcohol/week: 0.0 standard drinks   Drug use: Yes    Frequency: 0.5 times per week     Types: Marijuana    Comment: weekly     Personal history of tobacco use presenting hazards to health: - This patient meets criteria for low-dose CT lung cancer screening  - The shared decision making visit discussion included risks and benefits of screening, potential for follow-up, diagnostic testing for abnormal scans, potential for false positive tests, overdiagnosis, discussion about total radiation exposure - Patient stated willingness to undergo diagnostics and treatment as needed - Patient was counseled on smoking cessation to decrease the  risk of lung cancer, pulmonary disease, heart disease, and stroke - Patient has been referred to Lung Cancer Screening Nurse Coordinator for further scheduling of LDCT and for further resources regarding free nicotine replacement therapy and information about smoking cessation classes - Counseling on the importance of adherence to annual lung cancer LDCT screening, impact of co-morbidities, and ability or willingness to undergo diagnosis and treatment is imperative for compliance of the program. - Counseling on the importance of continued smoking cessation for former smokers; the importance of smoking cessation for current smokers, and information about tobacco cessation interventions have been given to patient including [place] and 1-800-quit  programs.  Yearly follow up will be coordinated by Jamie Huguenin, RN, MSN [facility] Oncology Nurse Navigator.)  Harriett Rush, PA-C  11/11/20 11:11 AM

## 2020-11-10 NOTE — Patient Instructions (Signed)
You were seen today for a Lung Cancer Screening shared decision making visit and low-dose CT scan.

## 2020-11-11 ENCOUNTER — Ambulatory Visit (HOSPITAL_COMMUNITY)
Admission: RE | Admit: 2020-11-11 | Discharge: 2020-11-11 | Disposition: A | Discharge status: Home / Self Care | Payer: Medicare Other | Source: Ambulatory Visit | Attending: Physician Assistant | Admitting: Physician Assistant

## 2020-11-11 ENCOUNTER — Other Ambulatory Visit: Payer: Self-pay

## 2020-11-11 ENCOUNTER — Inpatient Hospital Stay (HOSPITAL_COMMUNITY): Payer: Medicare Other | Attending: Physician Assistant | Admitting: Physician Assistant

## 2020-11-11 DIAGNOSIS — Z122 Encounter for screening for malignant neoplasm of respiratory organs: Secondary | ICD-10-CM | POA: Diagnosis present

## 2020-11-11 DIAGNOSIS — Z87891 Personal history of nicotine dependence: Secondary | ICD-10-CM | POA: Diagnosis not present

## 2020-11-16 ENCOUNTER — Other Ambulatory Visit: Payer: Medicare Other | Admitting: Obstetrics & Gynecology

## 2020-11-16 ENCOUNTER — Encounter (HOSPITAL_COMMUNITY): Payer: Self-pay

## 2020-11-16 NOTE — Progress Notes (Signed)
Patient notified of LDCT Lung Cancer Screening Results via mail with the recommendation to follow-up in 12 months. Patient's referring provider has been sent a copy of results. Results are as follows:  IMPRESSION: 1. Lung-RADS 2, benign appearance or behavior. Continue annual screening with low-dose chest CT without contrast in 12 months. 2. Diffuse bronchial wall thickening with emphysema, as above; imaging findings suggestive of underlying COPD. 3. Coronary artery calcifications. 4. Nodular appearing contour the liver is suggestive of early cirrhosis.

## 2020-11-18 ENCOUNTER — Ambulatory Visit: Payer: Medicare Other | Attending: Family Medicine | Admitting: Audiologist

## 2020-11-18 ENCOUNTER — Other Ambulatory Visit: Payer: Self-pay

## 2020-11-18 DIAGNOSIS — H903 Sensorineural hearing loss, bilateral: Secondary | ICD-10-CM | POA: Insufficient documentation

## 2020-11-18 NOTE — Procedures (Signed)
  Outpatient Audiology and [place], [place]  [id number] [phone]  AUDIOLOGICAL  EVALUATION  NAME: Jamie Mcgee     DOB:   03/06/1961      MRN: [id number]                                                                                     DATE: 11/18/2020     REFERENT: [name], DO STATUS: Outpatient DIAGNOSIS: Sensorineural Hearing Loss Bilateral    History: Jamie Mcgee was seen for an audiological evaluation.  Jamie Mcgee is receiving a hearing evaluation due to concerns for difficulty hearing the television. Jamie Mcgee has difficulty hearing in background noise, crowds, and when people are at a distance. This difficulty began gradually. No pain or pressure reported in either ear. No tinnitus present in either ears. Jamie Mcgee has a no history of noise exposure. Jamie Mcgee denies any family history of hearing loss, surgery to the ear, or exposure to ototoxic medication.  Medical history negative for a condition which is a risk factor for hearing loss. No other relevant case history reported.    Evaluation:  Otoscopy showed a clear view of the tympanic membranes, bilaterally Tympanometry results were consistent with normal middle ear function, bilaterally   Audiometric testing was completed using conventional audiometry with insert and supraural transducer. Speech Recognition Thresholds were consistent with pure tone averages. Word Recognition was excellent at an elevated level. Pure tone thresholds show normal sloping to a moderate sensorineural hearing loss in both ears. 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   Results:  The test results were reviewed with Jamie Mcgee, Jamie Mcgee was counseled on the nature and degree of hers hearing loss. Jamie Mcgee was provided with several copies of her audiogram that illustrate her degree of hearing loss in both ears. Her hearing loss is in the high frequencies preventing Jamie Mcgee from hearing high frequency consonants such as  /s/ /sh/ /f/ /t/ and /th/. These sounds help differentiate the words Jamie Mcgee hears. Without these sounds, speech is muffled and unclear unless someone is face to face within 5 feet without a mask. Recommended TV Ears or a sound bar to help understand the TV. Jamie Mcgee is not ready to pursue hearing aids. Jamie Mcgee needs to be seen annually to monitor the slight asymmetry in her hearing. Jamie Mcgee reported understanding.   Recommendations: 1.   Audiologic monitoring is needed due to slight asymmetry in her hearing loss. Recommend annual audiologic evaluation.    Alfonse Alpers  Audiologist, Au.D., CCC-A 11/18/2020  2:17 PM  Cc: [name], DO

## 2020-12-11 ENCOUNTER — Encounter: Payer: Self-pay | Admitting: Family Medicine

## 2020-12-13 ENCOUNTER — Encounter: Payer: Self-pay | Admitting: Family Medicine

## 2020-12-13 ENCOUNTER — Other Ambulatory Visit: Payer: Self-pay | Admitting: Family Medicine

## 2020-12-13 DIAGNOSIS — R0609 Other forms of dyspnea: Secondary | ICD-10-CM

## 2020-12-13 DIAGNOSIS — F17200 Nicotine dependence, unspecified, uncomplicated: Secondary | ICD-10-CM

## 2020-12-13 DIAGNOSIS — J449 Chronic obstructive pulmonary disease, unspecified: Secondary | ICD-10-CM

## 2020-12-13 DIAGNOSIS — R0600 Dyspnea, unspecified: Secondary | ICD-10-CM

## 2020-12-31 ENCOUNTER — Other Ambulatory Visit: Payer: Self-pay

## 2020-12-31 ENCOUNTER — Encounter: Payer: Self-pay | Admitting: Pulmonary Disease

## 2020-12-31 ENCOUNTER — Ambulatory Visit (INDEPENDENT_AMBULATORY_CARE_PROVIDER_SITE_OTHER): Payer: Medicare Other | Admitting: Pulmonary Disease

## 2020-12-31 DIAGNOSIS — J431 Panlobular emphysema: Secondary | ICD-10-CM | POA: Diagnosis not present

## 2020-12-31 DIAGNOSIS — G4733 Obstructive sleep apnea (adult) (pediatric): Secondary | ICD-10-CM | POA: Diagnosis not present

## 2020-12-31 DIAGNOSIS — F17210 Nicotine dependence, cigarettes, uncomplicated: Secondary | ICD-10-CM

## 2020-12-31 MED ORDER — NICOTINE 10 MG IN INHA: 1.0000 | RESPIRATORY_TRACT | 2 refills | Status: DC | PRN

## 2020-12-31 MED ORDER — BREZTRI AEROSPHERE 160-9-4.8 MCG/ACT IN AERO: 2.0000 | INHALATION_SPRAY | Freq: Two times a day (BID) | RESPIRATORY_TRACT | 0 refills | Status: DC

## 2020-12-31 MED ORDER — ALBUTEROL SULFATE HFA 108 (90 BASE) MCG/ACT IN AERS: 2.0000 | INHALATION_SPRAY | Freq: Four times a day (QID) | RESPIRATORY_TRACT | 3 refills | Status: DC | PRN

## 2020-12-31 NOTE — Assessment & Plan Note (Signed)
It appears to be mild based on PSG in 2019.  She has not gained weight since then but symptoms certainly appear to be more severe, this may be related to supine sleep which was not noted during the prior PSG. Due to history of witnessed apneas and loud snoring, will repeat home sleep test and have a low threshold to place on CPAP if needed  . The pathophysiology of obstructive sleep apnea , it's cardiovascular consequences & modes of treatment including CPAP were discused with the patient in detail & they evidenced understanding.

## 2020-12-31 NOTE — Assessment & Plan Note (Signed)
At least one of her previous spirometry's show airway obstruction.  She does have emphysema on her CT scan.  Her more current PFTs show restriction but I am convinced that she does have COPD based on her history. Given her persistent symptoms on Dulera, we will switch her to triple therapy.  I have provided her with a sample of Breztri to trial and given an albuterol for breakthrough symptoms. We will schedule spirometry to reassess

## 2020-12-31 NOTE — Assessment & Plan Note (Signed)
Smoking cessation was advised is the most important intervention that would add years to her life. I asked her to use nicotine patch and we will provide her with a prescription for Nicotrol inhaler for breakthrough. Chantix was given her nightmares in the past and she does not want to trial this again

## 2020-12-31 NOTE — Progress Notes (Signed)
Subjective:    Patient ID: Jamie Mcgee, female    DOB: Dec 28, 1960, 60 y.o.   MRN: [id number]  HPI  Chief Complaint  Patient presents with   Consult    Patient has shortness of breath with exertion. States she is very restless and has trouble staying asleep. States when she wakes up she feels more tired than she did when she went to bed. Sleep study in 2018. Patient states that she snores. Is not sure if she stops breathing.    60 year old heavy smoker presents for evaluation of dyspnea and sleep disordered breathing  She smokes 2 packs/day, more than 60 pack years she quit multiple times, longest for 7 months using nicotine patch and vaping.  Chantix gave her nightmares She reports dyspnea on exertion for the last 3 months, worse with activity and with heat and humidity, relieved by sitting.  She has been maintained on a regimen of Dulera and does not have short acting inhaler. We reviewed results of her low-dose CT screening scan which showed evidence of emphysema and right upper lobe tiny nodule. This also showed a nodular liver, LFTs appear okay, she has a remote history of EtOH use.  Epworth Sleepiness Scale is 12. Her best friend has noted loud snoring, she reports gasping episodes which occasionally wake her up during sleep. Bedtime is between 9 PM and 1 AM, sleep latency is minimal, she sleeps on her side with 1 pillow, reports multiple nocturnal awakenings including nocturia and is out of bed between 6 and 8 AM feeling tired with dryness of mouth and denies headaches We reviewed her sleep study results from 2019.  She is convinced that she needs a CPAP machine    Significant tests/ events reviewed  LDCT chest 11/2020 paraseptal emphysema, right upper lobe 4 mm nodule, nodular liver  12/2017 PFTs ratio 73, FEV1 1.41/54%, FVC 57%, no bronchodilator response, TLC 103%, DLCO 80%.  09/2017 spirometry moderate restriction, ratio 76, FEV1 54%, FVC 55%  07/2017 spirometry moderate  airway obstruction, ratio 65, FEV1 38%, FVC  46%  N PSG 01/2018-206 pounds-AHI 7/hour lowest desaturation 88%     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   Depression    Fibromuscular dysplasia of renal artery ([place]) 2006   GERD (gastroesophageal reflux disease)    Headache    migraines 20 years ago   Hepatitis    type A in 1977   HNP (herniated nucleus pulposus), cervical    HPV (human papilloma virus) infection    HTN (hypertension)    Neuropathy    Vitamin D deficiency    Past Surgical History:  Procedure Laterality Date   ANTERIOR CERVICAL DECOMP/DISCECTOMY FUSION N/A 10/05/2017   Procedure: C5-6 ANTERIOR CERVICAL DECOMPRESSION/DISCECTOMY FUSION,  ALLOGRAFT, PLATE  (NECK FIRST AND CARPAL TUNNEL RELEASE SECOND);  Surgeon: Marybelle Killings, MD;  Location: [facility];  Service: Orthopedics;  Laterality: N/A;   APPENDECTOMY  1993   BREAST BIOPSY Right    CARPAL TUNNEL RELEASE Right 10/05/2017   Procedure: RIGHT CARPAL TUNNEL RELEASE;  Surgeon: Marybelle Killings, MD;  Location: [facility];  Service: Orthopedics;  Laterality: Right;   CATARACT EXTRACTION  08-2010   left    CATARACT EXTRACTION W/PHACO  06/01/2011   Procedure: CATARACT EXTRACTION PHACO AND  INTRAOCULAR LENS PLACEMENT (IOC);  Surgeon: Tonny Branch;  Location: [facility];  Service: Ophthalmology;  Laterality: Right;  CDE:9.62   CHOLECYSTECTOMY     COLONOSCOPY WITH PROPOFOL N/A 07/06/2020   Procedure: COLONOSCOPY WITH PROPOFOL;  Surgeon: Eloise Harman, DO;  Location: [facility];  Service: Endoscopy;  Laterality: N/A;  11:00am   RENAL ARTERY ANGIOPLASTY  2006   right   TUBAL LIGATION  1984   [facility]     Allergies  Allergen Reactions   Lipitor [Atorvastatin] Other (See Comments)    Muscle aches and nausea   Naproxen Nausea And Vomiting    Stomach pain    .soc   Family  History  Problem Relation Age of Onset   Coronary artery disease Father        States congenital abnormality   Diabetes Father    Hyperlipidemia Father    CAD Sister        Stents    Cancer Sister        breast   Cancer Mother        breast   Cancer Maternal Aunt        breast   Cancer Maternal Grandmother        breast   Anesthesia problems Neg Hx    Hypotension Neg Hx    Malignant hyperthermia Neg Hx    Pseudochol deficiency Neg Hx       Review of Systems Notes of breath with activity Acid heartburn and indigestion Difficulty swallowing Dental problems Depression Joint stiffness  Constitutional: negative for anorexia, fevers and sweats  Eyes: negative for irritation, redness and visual disturbance  Ears, nose, mouth, throat, and face: negative for earaches, epistaxis, nasal congestion and sore throat  Respiratory: negative for cough,sputum and wheezing  Cardiovascular: negative for chest pain, dyspnea, lower extremity edema, orthopnea, palpitations and syncope  Gastrointestinal: negative for abdominal pain, constipation, diarrhea, melena, nausea and vomiting  Genitourinary:negative for dysuria, frequency and hematuria  Hematologic/lymphatic: negative for bleeding, easy bruising and lymphadenopathy  Musculoskeletal:negative for arthralgias, muscle weakness   Neurological: negative for coordination problems, gait problems, headaches and weakness  Endocrine: negative for diabetic symptoms including polydipsia, polyuria and weight loss       Objective:   Physical Exam  Gen. Pleasant, obese, in no distress, normal affect ENT - no pallor,icterus, no post nasal drip, class 2-3 airway Neck: No JVD, no thyromegaly, no carotid bruits Lungs: no use of accessory muscles, no dullness to percussion, decreased without rales or rhonchi  Cardiovascular: Rhythm regular, heart sounds  normal, no murmurs or gallops, no peripheral edema Abdomen: soft and non-tender, no  hepatosplenomegaly, BS normal. Musculoskeletal: No deformities, no cyanosis or clubbing Neuro:  alert, non focal, no tremors        Assessment & Plan:

## 2020-12-31 NOTE — Patient Instructions (Signed)
  Sample of Breztri -2 puffs twice daily , take instead of Dulera, rinse mouth after use, call us for prescription of this works. Prescription for albuterol MDI 2 puffs every 6 hours as needed for wheezing/shortness of breath.  You have to  QUIT smoking  !! Prescription for Nicotrol inhaler 1 puff every 2 hours as needed # 150  Schedule home sleep test. Schedule spirometry pre and post Low-dose CT chest follow-up in June 2023

## 2021-01-14 ENCOUNTER — Other Ambulatory Visit (HOSPITAL_COMMUNITY): Payer: Self-pay | Admitting: Family Medicine

## 2021-01-14 DIAGNOSIS — Z1231 Encounter for screening mammogram for malignant neoplasm of breast: Secondary | ICD-10-CM

## 2021-01-17 MED ORDER — BREZTRI AEROSPHERE 160-9-4.8 MCG/ACT IN AERO: 2.0000 | INHALATION_SPRAY | Freq: Two times a day (BID) | RESPIRATORY_TRACT | 5 refills | Status: DC

## 2021-01-19 ENCOUNTER — Ambulatory Visit (HOSPITAL_COMMUNITY): Payer: Medicare Other

## 2021-01-26 ENCOUNTER — Ambulatory Visit (HOSPITAL_COMMUNITY)
Admission: RE | Admit: 2021-01-26 | Discharge: 2021-01-26 | Disposition: A | Discharge status: Home / Self Care | Payer: Medicare Other | Source: Ambulatory Visit | Attending: Family Medicine | Admitting: Family Medicine

## 2021-01-26 ENCOUNTER — Other Ambulatory Visit: Payer: Self-pay

## 2021-01-26 DIAGNOSIS — Z1231 Encounter for screening mammogram for malignant neoplasm of breast: Secondary | ICD-10-CM | POA: Diagnosis not present

## 2021-02-01 ENCOUNTER — Other Ambulatory Visit: Payer: Self-pay | Admitting: Family Medicine

## 2021-02-01 ENCOUNTER — Institutional Professional Consult (permissible substitution): Payer: Medicare Other | Admitting: Internal Medicine

## 2021-02-01 DIAGNOSIS — I10 Essential (primary) hypertension: Secondary | ICD-10-CM

## 2021-02-01 DIAGNOSIS — E7800 Pure hypercholesterolemia, unspecified: Secondary | ICD-10-CM

## 2021-02-01 DIAGNOSIS — F4323 Adjustment disorder with mixed anxiety and depressed mood: Secondary | ICD-10-CM

## 2021-02-01 DIAGNOSIS — F331 Major depressive disorder, recurrent, moderate: Secondary | ICD-10-CM

## 2021-02-04 ENCOUNTER — Other Ambulatory Visit: Payer: Self-pay | Admitting: Family Medicine

## 2021-02-04 ENCOUNTER — Encounter: Payer: Medicare Other | Admitting: Family Medicine

## 2021-02-04 DIAGNOSIS — G8929 Other chronic pain: Secondary | ICD-10-CM

## 2021-02-17 ENCOUNTER — Ambulatory Visit: Payer: Medicare Other

## 2021-02-17 ENCOUNTER — Other Ambulatory Visit: Payer: Self-pay

## 2021-02-17 DIAGNOSIS — G4733 Obstructive sleep apnea (adult) (pediatric): Secondary | ICD-10-CM

## 2021-02-23 ENCOUNTER — Telehealth: Payer: Self-pay | Admitting: Pulmonary Disease

## 2021-02-23 DIAGNOSIS — G4733 Obstructive sleep apnea (adult) (pediatric): Secondary | ICD-10-CM | POA: Diagnosis not present

## 2021-02-23 NOTE — Telephone Encounter (Signed)
HST showed mod  OSA with AHI 20/ hr Suggest autoCPAP  5-15 cm, mask of choice OV with me/APP in 6 wks after starting

## 2021-02-25 NOTE — Telephone Encounter (Signed)
Spoke with the pt and notified of results of sleep study. Pt verbalized understanding and was agreeable to CPAP therapy. I have placed DME referral for this and pt aware to contact the office for 31-90 day f/u once they begin using machine per insurance requirement.   

## 2021-03-04 ENCOUNTER — Other Ambulatory Visit (HOSPITAL_COMMUNITY)
Admission: RE | Admit: 2021-03-04 | Discharge: 2021-03-04 | Disposition: A | Discharge status: Home / Self Care | Payer: Medicare Other | Source: Ambulatory Visit | Attending: Pulmonary Disease | Admitting: Pulmonary Disease

## 2021-03-04 ENCOUNTER — Other Ambulatory Visit: Payer: Self-pay

## 2021-03-04 DIAGNOSIS — Z20822 Contact with and (suspected) exposure to covid-19: Secondary | ICD-10-CM | POA: Insufficient documentation

## 2021-03-04 DIAGNOSIS — Z01812 Encounter for preprocedural laboratory examination: Secondary | ICD-10-CM | POA: Diagnosis present

## 2021-03-04 LAB — SARS CORONAVIRUS 2 (TAT 6-24 HRS): SARS Coronavirus 2: NEGATIVE

## 2021-03-08 ENCOUNTER — Other Ambulatory Visit: Payer: Self-pay

## 2021-03-08 ENCOUNTER — Ambulatory Visit (HOSPITAL_COMMUNITY)
Admission: RE | Admit: 2021-03-08 | Discharge: 2021-03-08 | Disposition: A | Discharge status: Home / Self Care | Payer: Medicare Other | Source: Ambulatory Visit | Attending: Pulmonary Disease | Admitting: Pulmonary Disease

## 2021-03-08 DIAGNOSIS — J431 Panlobular emphysema: Secondary | ICD-10-CM

## 2021-03-08 DIAGNOSIS — F17210 Nicotine dependence, cigarettes, uncomplicated: Secondary | ICD-10-CM

## 2021-03-08 LAB — PULMONARY FUNCTION TEST
DL/VA % pred: 75 %
DL/VA: 3.18 ml/min/mmHg/L
DLCO unc % pred: 56 %
DLCO unc: 11.48 ml/min/mmHg
FEF 25-75 Post: 0.93 L/sec
FEF 25-75 Pre: 1.08 L/sec
FEF2575-%Change-Post: -13 %
FEF2575-%Pred-Post: 40 %
FEF2575-%Pred-Pre: 46 %
FEV1-%Change-Post: -2 %
FEV1-%Pred-Post: 53 %
FEV1-%Pred-Pre: 54 %
FEV1-Post: 1.34 L
FEV1-Pre: 1.37 L
FEV1FVC-%Change-Post: 1 %
FEV1FVC-%Pred-Pre: 96 %
FEV6-%Change-Post: -4 %
FEV6-%Pred-Post: 55 %
FEV6-%Pred-Pre: 58 %
FEV6-Post: 1.76 L
FEV6-Pre: 1.83 L
FEV6FVC-%Pred-Post: 103 %
FEV6FVC-%Pred-Pre: 103 %
FVC-%Change-Post: -4 %
FVC-%Pred-Post: 53 %
FVC-%Pred-Pre: 56 %
FVC-Post: 1.76 L
FVC-Pre: 1.84 L
Post FEV1/FVC ratio: 76 %
Post FEV6/FVC ratio: 100 %
Pre FEV1/FVC ratio: 75 %
Pre FEV6/FVC Ratio: 100 %
RV % pred: 147 %
RV: 2.95 L
TLC % pred: 97 %
TLC: 4.91 L

## 2021-03-08 MED ORDER — ALBUTEROL SULFATE (2.5 MG/3ML) 0.083% IN NEBU
2.5000 mg | INHALATION_SOLUTION | Freq: Once | RESPIRATORY_TRACT | Status: AC
  Administered 2021-03-08: 2.5 mg via RESPIRATORY_TRACT

## 2021-03-11 DIAGNOSIS — G4733 Obstructive sleep apnea (adult) (pediatric): Secondary | ICD-10-CM | POA: Diagnosis not present

## 2021-03-17 ENCOUNTER — Encounter: Payer: Medicare Other | Admitting: Family Medicine

## 2021-04-11 ENCOUNTER — Ambulatory Visit: Payer: Medicare Other | Admitting: Pulmonary Disease

## 2021-04-11 DIAGNOSIS — G4733 Obstructive sleep apnea (adult) (pediatric): Secondary | ICD-10-CM | POA: Diagnosis not present

## 2021-04-13 ENCOUNTER — Other Ambulatory Visit: Payer: Self-pay

## 2021-04-13 ENCOUNTER — Ambulatory Visit (INDEPENDENT_AMBULATORY_CARE_PROVIDER_SITE_OTHER): Payer: Medicare Other | Admitting: Family Medicine

## 2021-04-13 VITALS — BP 129/71 | HR 67 | Temp 97.7°F | Ht 64.0 in | Wt 208.8 lb

## 2021-04-13 DIAGNOSIS — E559 Vitamin D deficiency, unspecified: Secondary | ICD-10-CM

## 2021-04-13 DIAGNOSIS — F4323 Adjustment disorder with mixed anxiety and depressed mood: Secondary | ICD-10-CM

## 2021-04-13 DIAGNOSIS — Z79899 Other long term (current) drug therapy: Secondary | ICD-10-CM | POA: Diagnosis not present

## 2021-04-13 DIAGNOSIS — E7800 Pure hypercholesterolemia, unspecified: Secondary | ICD-10-CM

## 2021-04-13 DIAGNOSIS — Z23 Encounter for immunization: Secondary | ICD-10-CM | POA: Diagnosis not present

## 2021-04-13 DIAGNOSIS — M5442 Lumbago with sciatica, left side: Secondary | ICD-10-CM

## 2021-04-13 DIAGNOSIS — M5441 Lumbago with sciatica, right side: Secondary | ICD-10-CM

## 2021-04-13 DIAGNOSIS — F331 Major depressive disorder, recurrent, moderate: Secondary | ICD-10-CM

## 2021-04-13 DIAGNOSIS — G8929 Other chronic pain: Secondary | ICD-10-CM

## 2021-04-13 DIAGNOSIS — I10 Essential (primary) hypertension: Secondary | ICD-10-CM

## 2021-04-13 DIAGNOSIS — J431 Panlobular emphysema: Secondary | ICD-10-CM

## 2021-04-13 MED ORDER — PANTOPRAZOLE SODIUM 20 MG PO TBEC: 20.0000 mg | DELAYED_RELEASE_TABLET | Freq: Every day | ORAL | 3 refills | Status: DC

## 2021-04-13 MED ORDER — HYDROCHLOROTHIAZIDE 25 MG PO TABS: 25.0000 mg | ORAL_TABLET | Freq: Every day | ORAL | 3 refills | Status: DC

## 2021-04-13 MED ORDER — ROSUVASTATIN CALCIUM 5 MG PO TABS: 5.0000 mg | ORAL_TABLET | Freq: Every day | ORAL | 3 refills | Status: DC

## 2021-04-13 MED ORDER — BUSPIRONE HCL 10 MG PO TABS: 10.0000 mg | ORAL_TABLET | Freq: Three times a day (TID) | ORAL | 3 refills | Status: DC

## 2021-04-13 MED ORDER — NITROGLYCERIN 0.4 MG SL SUBL: 0.4000 mg | SUBLINGUAL_TABLET | SUBLINGUAL | 3 refills | Status: DC | PRN

## 2021-04-13 MED ORDER — CARVEDILOL 6.25 MG PO TABS: 6.2500 mg | ORAL_TABLET | Freq: Two times a day (BID) | ORAL | 3 refills | Status: DC

## 2021-04-13 MED ORDER — PREGABALIN 150 MG PO CAPS: ORAL_CAPSULE | ORAL | 1 refills | Status: DC

## 2021-04-13 MED ORDER — CITALOPRAM HYDROBROMIDE 40 MG PO TABS: 40.0000 mg | ORAL_TABLET | Freq: Every day | ORAL | 3 refills | Status: DC

## 2021-04-13 MED ORDER — LOSARTAN POTASSIUM 100 MG PO TABS: 100.0000 mg | ORAL_TABLET | Freq: Every day | ORAL | 3 refills | Status: DC

## 2021-04-13 NOTE — Progress Notes (Signed)
Subjective: CC: Chronic back pain PCP: [name], DO LXB:[id number] Jamie Mcgee is a 60 y.o. female presenting to clinic today for:  1.  Chronic back pain Patient is prescribed Lyrica chronically for back pain.  She reports fairly good control of symptoms with this medication.  Denies any falls, visual or auditory hallucinations.  No excessive daytime sedation.  Last fill was September.  She notes that she does use CBD  2.  Emphysema Patient is compliant with Judithann Sauger and she finds that this is much more helpful than the [facility] was.  She has an appointment coming up with pulmonology soon.  Continues to use albuterol as needed for activity.  She continues to smoke but is on her last pack of cigarettes and will be starting Nicotrol and nicotine patches thereafter.  Denies any hemoptysis, brown sputum, change in exercise tolerance, unplanned weight loss or night sweats.  3.  GERD Patient is currently on OTC Prilosec for GERD.  This works well but she finds it to be extremely expensive so wondering if we can switch her over to something prescription.  No reports of GI bleed or nausea or vomiting   ROS: Per HPI  Allergies  Allergen Reactions   Lipitor [Atorvastatin] Other (See Comments)    Muscle aches and nausea   Naproxen Nausea And Vomiting    Stomach pain   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   Depression    Fibromuscular dysplasia of renal artery ([place]) 2006   GERD (gastroesophageal reflux disease)    Headache    migraines 20 years ago   Hepatitis    type A in 1977   HNP (herniated nucleus pulposus), cervical    HPV (human papilloma virus) infection    HTN (hypertension)    Neuropathy    Vitamin D deficiency     Current Outpatient Medications:    albuterol (VENTOLIN HFA) 108 (90 Base) MCG/ACT inhaler,  Inhale 2 puffs into the lungs every 6 (six) hours as needed for wheezing or shortness of breath., Disp: 8 g, Rfl: 3   aspirin EC 81 MG tablet, Take 81 mg by mouth daily. Swallow whole., Disp: , Rfl:    Budeson-Glycopyrrol-Formoterol (BREZTRI AEROSPHERE) 160-9-4.8 MCG/ACT AERO, Inhale 2 puffs into the lungs 2 (two) times daily., Disp: 10.7 g, Rfl: 5   busPIRone (BUSPAR) 10 MG tablet, TAKE ONE TABLET BY MOUTH THREE TIMES DAILY, Disp: 270 tablet, Rfl: 0   carvedilol (COREG) 6.25 MG tablet, TAKE ONE TABLET BY MOUTH TWICE DAILY WITH A MEAL., Disp: 180 tablet, Rfl: 0   citalopram (CELEXA) 40 MG tablet, TAKE ONE TABLET BY MOUTH ONCE DAILY, Disp: 90 tablet, Rfl: 0   fexofenadine (ALLEGRA) 180 MG tablet, Take 180 mg by mouth daily., Disp: , Rfl:    hydrochlorothiazide (HYDRODIURIL) 25 MG tablet, TAKE ONE TABLET BY MOUTH ONCE DAILY, Disp: 90 tablet, Rfl: 0   losartan (COZAAR) 100 MG tablet, TAKE ONE TABLET BY MOUTH ONCE DAILY, Disp: 90 tablet, Rfl: 0   nicotine (NICOTROL) 10 MG inhaler, Inhale 1 Cartridge (1 continuous puffing total) into the lungs every 2 (two) hours as needed for smoking cessation., Disp: 150 each, Rfl: 2   nitroGLYCERIN (NITROSTAT) 0.4 MG SL tablet, Place 1 tablet (0.4 mg total) under the tongue every  5 (five) minutes as needed., Disp: 25 tablet, Rfl: 3   omeprazole (PRILOSEC OTC) 20 MG tablet, Take 20 mg by mouth daily., Disp: , Rfl:    pregabalin (LYRICA) 150 MG capsule, Take 145m each morning and 302meach evening. Pharmacy: Cancel previous rx, Disp: 270 capsule, Rfl: 1   rosuvastatin (CRESTOR) 5 MG tablet, TAKE ONE TABLET BY MOUTH AT BEDTIME, Disp: 90 tablet, Rfl: 0 Social History   Socioeconomic History   Marital status: Divorced    Spouse name: Not on file   Number of children: Not on file   Years of education: Not on file   Highest education level: Not on file  Occupational History   Not on file  Tobacco Use   Smoking status: Every Day    Packs/day: 1.00    Years: 40.00     Pack years: 40.00    Types: Cigarettes   Smokeless tobacco: Never   Tobacco comments:    2 packs a day [place]20[place]    pt quit smoking 2016 for 7 months but restarted last year  Vaping Use   Vaping Use: Never used  Substance and Sexual Activity   Alcohol use: No    Alcohol/week: 0.0 standard drinks   Drug use: Yes    Frequency: 0.5 times per week    Types: Marijuana    Comment: weekly   Sexual activity: Not Currently    Birth control/protection: Post-menopausal  Other Topics Concern   Not on file  Social History Narrative   The patient does not have any sex drive and has not had one for almost a year.   Her   husband thinks she does not love him anymore.  He does not seem to understand   her   depression.   Social Determinants of Health   Financial Resource Strain: Medium Risk   Difficulty of Paying Living Expenses: Somewhat hard  Food Insecurity: No Food Insecurity   Worried About RuCharity fundraisern the Last Year: Never true   Ran Out of Food in the Last Year: Never true  Transportation Needs: No Transportation Needs   Lack of Transportation (Medical): No   Lack of Transportation (Non-Medical): No  Physical Activity: Inactive   Days of Exercise per Week: 0 days   Minutes of Exercise per Session: 0 min  Stress: No Stress Concern Present   Feeling of Stress : Only a little  Social Connections: Socially Isolated   Frequency of Communication with Friends and Family: More than three times a week   Frequency of Social Gatherings with Friends and Family: Twice a week   Attends Religious Services: Never   AcMarine scientistr Organizations: No   Attends ClMusic therapistNever   Marital Status: Divorced  InHuman resources officeriolence: Not At Risk   Fear of Current or Ex-Partner: No   Emotionally Abused: No   Physically Abused: No   Sexually Abused: No   Family History  Problem Relation Age of Onset   Coronary artery disease Father        States  congenital abnormality   Diabetes Father    Hyperlipidemia Father    CAD Sister        Stents    Cancer Sister        breast   Cancer Mother        breast   Cancer Maternal Aunt        breast   Cancer Maternal Grandmother  breast   Anesthesia problems Neg Hx    Hypotension Neg Hx    Malignant hyperthermia Neg Hx    Pseudochol deficiency Neg Hx     Objective: Office vital signs reviewed. BP 129/71   Pulse 67   Temp 97.7 F (36.5 C)   Ht 5' 4"  (1.626 m)   Wt 208 lb 12.8 oz (94.7 kg)   LMP 05/25/2011   SpO2 97%   BMI 35.84 kg/m   Physical Examination:  General: Awake, alert, well nourished, No acute distress HEENT: Normal; sclera white. Cardio: regular rate and rhythm, S1S2 heard, no murmurs appreciated Pulm: Globally decreased breath sounds with normal work of breathing on room air.  No wheezes, rhonchi or rails MSK: Slightly antalgic gait and normal station  Assessment/ Plan: 60 y.o. female   Chronic bilateral low back pain with bilateral sciatica - Plan: pregabalin (LYRICA) 150 MG capsule, ToxASSURE Select 13 (MW), Urine  Controlled substance agreement signed - Plan: ToxASSURE Select 13 (MW), Urine  Pure hypercholesterolemia - Plan: rosuvastatin (CRESTOR) 5 MG tablet, CMP14+EGFR, Lipid Panel, TSH  Essential hypertension, benign - Plan: losartan (COZAAR) 100 MG tablet, hydrochlorothiazide (HYDRODIURIL) 25 MG tablet, carvedilol (COREG) 6.25 MG tablet, CMP14+EGFR  Situational mixed anxiety and depressive disorder - Plan: citalopram (CELEXA) 40 MG tablet, busPIRone (BUSPAR) 10 MG tablet  Moderate episode of recurrent major depressive disorder ([place]), Chronic - Plan: citalopram (CELEXA) 40 MG tablet  Panlobular emphysema (HCC) - Plan: Pneumococcal conjugate vaccine 20-valent (Prevnar 20), CBC  Vitamin D deficiency - Plan: VITAMIN D 25 Hydroxy (Vit-D Deficiency, Fractures)  UDS and CSC were updated as per office policy.  National narcotic database was reviewed  and there were no red flags.  We should expect to see CBD on her urine tox  Nonfasting lipid obtained.  Crestor renewed  Blood pressure well controlled.  Continue ARB and ACE inhibitor.  Check CMP  Anxiety depression not discussed at length today but medications were needed to be renewed  Pneumococcal vaccination administered today.  CBC ordered given leukocytosis noted back in May  Check vitamin D level given marked vitamin D deficiency noted in May  No orders of the defined types were placed in this encounter.  No orders of the defined types were placed in this encounter.    [name], DO [place] [phone]

## 2021-04-14 LAB — CMP14+EGFR
ALT: 16 IU/L (ref 0–32)
AST: 27 IU/L (ref 0–40)
Albumin/Globulin Ratio: 1.3 (ref 1.2–2.2)
Albumin: 3.8 g/dL (ref 3.8–4.8)
Alkaline Phosphatase: 108 IU/L (ref 44–121)
BUN/Creatinine Ratio: 5 — ABNORMAL LOW (ref 12–28)
BUN: 5 mg/dL — ABNORMAL LOW (ref 8–27)
Bilirubin Total: 0.5 mg/dL (ref 0.0–1.2)
CO2: 29 mmol/L (ref 20–29)
Calcium: 9.4 mg/dL (ref 8.7–10.3)
Chloride: 95 mmol/L — ABNORMAL LOW (ref 96–106)
Creatinine, Ser: 0.96 mg/dL (ref 0.57–1.00)
Globulin, Total: 3 g/dL (ref 1.5–4.5)
Glucose: 103 mg/dL — ABNORMAL HIGH (ref 70–99)
Potassium: 3.9 mmol/L (ref 3.5–5.2)
Sodium: 137 mmol/L (ref 134–144)
Total Protein: 6.8 g/dL (ref 6.0–8.5)
eGFR: 67 mL/min/{1.73_m2} (ref >59)

## 2021-04-14 LAB — CBC
Hematocrit: 43.3 % (ref 34.0–46.6)
Hemoglobin: 14.9 g/dL (ref 11.1–15.9)
MCH: 30.5 pg (ref 26.6–33.0)
MCHC: 34.4 g/dL (ref 31.5–35.7)
MCV: 89 fL (ref 79–97)
Platelets: 184 10*3/uL (ref 150–450)
RBC: 4.89 x10E6/uL (ref 3.77–5.28)
RDW: 13.4 % (ref 11.7–15.4)
WBC: 8.4 10*3/uL (ref 3.4–10.8)

## 2021-04-14 LAB — LIPID PANEL
Chol/HDL Ratio: 2.1 ratio (ref 0.0–4.4)
Cholesterol, Total: 124 mg/dL (ref 100–199)
HDL: 58 mg/dL (ref >39)
LDL Chol Calc (NIH): 52 mg/dL (ref 0–99)
Triglycerides: 70 mg/dL (ref 0–149)
VLDL Cholesterol Cal: 14 mg/dL (ref 5–40)

## 2021-04-14 LAB — TSH: TSH: 1.82 u[IU]/mL (ref 0.450–4.500)

## 2021-04-14 LAB — VITAMIN D 25 HYDROXY (VIT D DEFICIENCY, FRACTURES): Vit D, 25-Hydroxy: 65 ng/mL (ref 30.0–100.0)

## 2021-04-20 LAB — TOXASSURE SELECT 13 (MW), URINE

## 2021-05-04 ENCOUNTER — Telehealth: Payer: Self-pay | Admitting: Pulmonary Disease

## 2021-05-04 NOTE — Telephone Encounter (Signed)
RA please advise pharmacy unable to obtain [facility]. Wants to know if Trelegy is ok in place of Breztri. If so what strength? Thanks

## 2021-05-04 NOTE — Telephone Encounter (Signed)
Matt from [employer] called stating there is an issue with Social worker and they do not have stock on [place]- would like to know if they can give patient Trelegy (if so they need the strength). Matt states the patient was okay with it if the doctor is.  Please advise.

## 2021-05-06 NOTE — Telephone Encounter (Signed)
Call made to patient, confirmed DOB. Patient was able to pick up [facility] yesterday.   Nothing further needed at this time.

## 2021-05-10 ENCOUNTER — Ambulatory Visit (INDEPENDENT_AMBULATORY_CARE_PROVIDER_SITE_OTHER): Payer: Medicare Other | Admitting: Family Medicine

## 2021-05-10 ENCOUNTER — Encounter: Payer: Self-pay | Admitting: Family Medicine

## 2021-05-10 ENCOUNTER — Ambulatory Visit (INDEPENDENT_AMBULATORY_CARE_PROVIDER_SITE_OTHER): Payer: Medicare Other

## 2021-05-10 VITALS — BP 137/75 | HR 86 | Temp 97.5°F | Ht 64.0 in | Wt 205.4 lb

## 2021-05-10 DIAGNOSIS — Z789 Other specified health status: Secondary | ICD-10-CM | POA: Diagnosis not present

## 2021-05-10 DIAGNOSIS — Z780 Asymptomatic menopausal state: Secondary | ICD-10-CM

## 2021-05-10 DIAGNOSIS — Z720 Tobacco use: Secondary | ICD-10-CM | POA: Diagnosis not present

## 2021-05-10 DIAGNOSIS — J431 Panlobular emphysema: Secondary | ICD-10-CM

## 2021-05-10 DIAGNOSIS — Z0001 Encounter for general adult medical examination with abnormal findings: Secondary | ICD-10-CM

## 2021-05-10 DIAGNOSIS — Z0000 Encounter for general adult medical examination without abnormal findings: Secondary | ICD-10-CM

## 2021-05-10 NOTE — Progress Notes (Signed)
Subjective:    Jamie Mcgee is a 60 y.o. female who presents for a Welcome to Medicare exam.   Review of Systems Positive for urge incontinence, dyspnea on exertion Cardiac Risk Factors include: advanced age (>43men, >55 women);dyslipidemia;hypertension;obesity (BMI >30kg/m2);sedentary lifestyle;smoking/ tobacco exposure      Objective:    Today's Vitals   05/10/21 1400 05/10/21 1401  BP: (!) 150/79 137/75  Pulse: 86   Temp: (!) 97.5 F (36.4 C)   SpO2: 93%   Weight: 205 lb 6.4 oz (93.2 kg)   Height: 5\' 4"  (1.626 m)   Body mass index is 35.26 kg/m.  Medications Outpatient Encounter Medications as of 05/10/2021  Medication Sig   albuterol (VENTOLIN HFA) 108 (90 Base) MCG/ACT inhaler Inhale 2 puffs into the lungs every 6 (six) hours as needed for wheezing or shortness of breath.   Budeson-Glycopyrrol-Formoterol (BREZTRI AEROSPHERE) 160-9-4.8 MCG/ACT AERO Inhale 2 puffs into the lungs 2 (two) times daily.   busPIRone (BUSPAR) 10 MG tablet Take 1 tablet (10 mg total) by mouth 3 (three) times daily.   CALCIUM-VITAMIN D PO Take by mouth.   carvedilol (COREG) 6.25 MG tablet Take 1 tablet (6.25 mg total) by mouth 2 (two) times daily with a meal.   citalopram (CELEXA) 40 MG tablet Take 1 tablet (40 mg total) by mouth daily.   fexofenadine (ALLEGRA) 180 MG tablet Take 180 mg by mouth daily.   hydrochlorothiazide (HYDRODIURIL) 25 MG tablet Take 1 tablet (25 mg total) by mouth daily.   losartan (COZAAR) 100 MG tablet Take 1 tablet (100 mg total) by mouth daily.   nicotine (NICOTROL) 10 MG inhaler Inhale 1 Cartridge (1 continuous puffing total) into the lungs every 2 (two) hours as needed for smoking cessation.   nitroGLYCERIN (NITROSTAT) 0.4 MG SL tablet Place 1 tablet (0.4 mg total) under the tongue every 5 (five) minutes as needed.   pantoprazole (PROTONIX) 20 MG tablet Take 1 tablet (20 mg total) by mouth daily. For acid reflux   pregabalin (LYRICA) 150 MG capsule Take 150mg  each  morning and 300mg  each evening. Pharmacy: Cancel previous rx   rosuvastatin (CRESTOR) 5 MG tablet Take 1 tablet (5 mg total) by mouth at bedtime.   aspirin EC 81 MG tablet Take 81 mg by mouth daily. Swallow whole. (Patient not taking: Reported on 05/10/2021)   No facility-administered encounter medications on file as of 05/10/2021.     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   Depression    Fibromuscular dysplasia of renal artery ([place]) 2006   GERD (gastroesophageal reflux disease)    Headache    migraines 20 years ago   Hepatitis    type A in 1977   HNP (herniated nucleus pulposus), cervical    HPV (human papilloma virus) infection    HTN (hypertension)    Neuropathy    Vitamin D deficiency    Past Surgical History:  Procedure Laterality Date   ANTERIOR CERVICAL DECOMP/DISCECTOMY FUSION N/A 10/05/2017   Procedure: C5-6 ANTERIOR CERVICAL DECOMPRESSION/DISCECTOMY FUSION,  ALLOGRAFT, PLATE  (NECK FIRST AND CARPAL TUNNEL RELEASE SECOND);  Surgeon: Marybelle Killings, MD;  Location: [facility];  Service: Orthopedics;  Laterality: N/A;   APPENDECTOMY  1993   BREAST BIOPSY Right    CARPAL TUNNEL  RELEASE Right 10/05/2017   Procedure: RIGHT CARPAL TUNNEL RELEASE;  Surgeon: Marybelle Killings, MD;  Location: [facility];  Service: Orthopedics;  Laterality: Right;   CATARACT EXTRACTION  08-2010   left    CATARACT EXTRACTION W/PHACO  06/01/2011   Procedure: CATARACT EXTRACTION PHACO AND INTRAOCULAR LENS PLACEMENT (IOC);  Surgeon: Tonny Branch;  Location: [facility];  Service: Ophthalmology;  Laterality: Right;  CDE:9.62   CHOLECYSTECTOMY     COLONOSCOPY WITH PROPOFOL N/A 07/06/2020   Procedure: COLONOSCOPY WITH PROPOFOL;  Surgeon: Eloise Harman, DO;  Location: [facility] END[facility]COPY;  Service: Endoscopy;  Laterality: N/A;  11:00am   RENAL ARTERY ANGIOPLASTY   2006   right   TUBAL LIGATION  1984   [facility]    Family History  Problem Relation Age of Onset   Coronary artery disease Father        States congenital abnormality   Diabetes Father    Hyperlipidemia Father    CAD Sister        Stents    Cancer Sister        breast   Cancer Mother        breast   Cancer Maternal Aunt        breast   Cancer Maternal Grandmother        breast   Anesthesia problems Neg Hx    Hypotension Neg Hx    Malignant hyperthermia Neg Hx    Pseudochol deficiency Neg Hx    Social History   Occupational History   Not on file  Tobacco Use   Smoking status: Every Day    Packs/day: 1.00    Years: 40.00    Pack years: 40.00    Types: Cigarettes   Smokeless tobacco: Never   Tobacco comments:    2 packs a day [place] 12/31/2020    pt quit smoking 2016 for 7 months but restarted last year  Vaping Use   Vaping Use: Never used  Substance and Sexual Activity   Alcohol use: No    Alcohol/week: 0.0 standard drinks   Drug use: Yes    Frequency: 0.5 times per week    Types: Marijuana    Comment: weekly   Sexual activity: Not Currently    Birth control/protection: Post-menopausal    Tobacco Counseling Ready to quit: Not Answered Counseling given: Not Answered Tobacco comments: 2 packs a day [place] 12/31/2020 pt quit smoking 2016 for 7 months but restarted last year   Immunizations and Health Maintenance Immunization History  Administered Date(s) Administered   Influenza,inj,Quad PF,6+ Mos 03/15/2018, 03/10/2020   Moderna Sars-Covid-2 Vaccination 10/06/2019   PNEUMOCOCCAL CONJUGATE-20 04/13/2021   Pneumococcal Polysaccharide-23 01/04/2018   Tdap 01/04/2018   Zoster Recombinat (Shingrix) 10/11/2020   Health Maintenance Due  Topic Date Due   DEXA SCAN  01/13/2021    Activities of Daily Living In your present state of health, do you have any difficulty performing the following activities: 05/10/2021 07/05/2020  Hearing? N Y  Comment - right is decreased   Vision? N Y  Difficulty concentrating or making decisions? N N  Walking or climbing stairs? N Y  Comment - back and knee pain  Dressing or bathing? N N  Doing errands, shopping? N N  Preparing Food and eating ? N -  Using the Toilet? N -  In the past six months, have you accidently leaked urine? Y -  Do you have problems with loss of bowel control? N -  Managing your Medications? N -  Managing your Finances? N -  Housekeeping or managing your Housekeeping? N -  Some recent data might be hidden    Physical Exam   Gen: awake, alert, NAD, smells of tobacco Pulm: globally decreased breath sounds. Normal work of breathing on room air Cardio: RRR, S1S2 heard. No murmurs MSK: ambulating independently  Advanced Directives: Does Patient Have a Medical Advance Directive?: No Would patient like information on creating a medical advance directive?: Yes (MAU/Ambulatory/Procedural Areas - Information given)    Assessment:    This is a routine wellness examination for this patient .   Vision/Hearing screen Sees Eye doctor. Saw hearing doctor and was told hearing loss mild and does not need hearing aids  Dietary issues and exercise activities discussed:  Current Exercise Habits: The patient does not participate in regular exercise at present, Exercise limited by: respiratory conditions(s)   Goals      Increase physical activity     Quit Smoking       Depression Screen PHQ 2/9 Scores 05/10/2021 04/13/2021 04/13/2021 10/28/2020  PHQ - 2 Score 0 0 0 1  PHQ- 9 Score 0 0 - 5     Fall Risk Fall Risk  05/10/2021  Falls in the past year? 0    Cognitive Function: MMSE - Mini Mental State Exam 05/10/2021  Orientation to time 5  Orientation to Place 5  Registration 3  Attention/ Calculation 5  Recall 3  Language- name 2 objects 2  Language- repeat 1  Language- follow 3 step command 3  Language- read & follow direction 1  Write a sentence 1  Copy design 1  Total score 30         Patient Care Team: [name], DO as PCP - General (Family Medicine) Herminio Commons, MD (Inactive) as PCP - Cardiology (Cardiology) Eloise Harman, DO as Consulting Physician (Internal Medicine)     Plan:    Welcome to Medicare preventive visit  DNI (do not intubate)  Panlobular emphysema ([place])  Tobacco use  Asymptomatic postmenopausal estrogen deficiency - Plan: DG [facility] DEXA  Handicap placard application provided today to the patient.  I have personally reviewed and noted the following in the patient's chart:   Medical and social history Use of alcohol, tobacco or illicit drugs  Current medications and supplements Functional ability and status Nutritional status Physical activity Advanced directives List of other physicians Hospitalizations, surgeries, and ER visits in previous 12 months Vitals Screenings to include cognitive, depression, and falls Referrals and appointments  In addition, I have reviewed and discussed with patient certain preventive protocols, quality metrics, and best practice recommendations. A written personalized care plan for preventive services as well as general preventive health recommendations were provided to patient.     Ronnie Doss, DO 05/10/2021

## 2021-05-10 NOTE — Patient Instructions (Signed)
Thank you for coming in today for your Annual Medicare Wellness Visit.  Things that we discussed today are included in this packet.  Create and/or bring a copy of your Living Will/ Advanced Directive into the office so that we may respect your wishes should an emergency occur.  Get the recommended life-saving vaccines we discussed today.  Get your mammogram/ colonoscopy/ DEXA scans as directed by your provider.  Make sure that your medications are organized and safely stored.  Remember to always ask for help if you forget when/ how to take your medications.  Make healthy food choices (Rich in fruits/ veggies/ lean meats and low in salt, sugar and fat)  Do something that you enjoy for at least 30 minutes every day to stay active (walking, gardening, swimming, etc). This will help you lower your risk of falls/ broken bones.  Be social, do puzzles/ crosswords.  These things help the mind stay young and lower your risk of developing dementia.  Make sure that your home is safe by checking your smoke detectors regularly and doing the things outlined below to lower your risk of falls.

## 2021-05-11 DIAGNOSIS — G4733 Obstructive sleep apnea (adult) (pediatric): Secondary | ICD-10-CM | POA: Diagnosis not present

## 2021-05-12 ENCOUNTER — Other Ambulatory Visit: Payer: Self-pay

## 2021-05-12 ENCOUNTER — Ambulatory Visit (INDEPENDENT_AMBULATORY_CARE_PROVIDER_SITE_OTHER): Payer: Medicare Other | Admitting: Pulmonary Disease

## 2021-05-12 ENCOUNTER — Encounter: Payer: Self-pay | Admitting: Pulmonary Disease

## 2021-05-12 VITALS — BP 138/90 | HR 70 | Temp 98.4°F | Ht 64.0 in | Wt 206.0 lb

## 2021-05-12 DIAGNOSIS — G4733 Obstructive sleep apnea (adult) (pediatric): Secondary | ICD-10-CM

## 2021-05-12 DIAGNOSIS — F17210 Nicotine dependence, cigarettes, uncomplicated: Secondary | ICD-10-CM | POA: Diagnosis not present

## 2021-05-12 DIAGNOSIS — Z720 Tobacco use: Secondary | ICD-10-CM | POA: Diagnosis not present

## 2021-05-12 DIAGNOSIS — J431 Panlobular emphysema: Secondary | ICD-10-CM

## 2021-05-12 NOTE — Patient Instructions (Addendum)
  CPAP is working well X Trial of nasal mask + chin strap  X Refer to pul rehab program  X LDCT chest in June 2023  X COngratulations on cutting down

## 2021-05-12 NOTE — Addendum Note (Signed)
Addended by: Fritzi Mandes D on: 05/12/2021 12:25 PM   Modules accepted: Orders

## 2021-05-12 NOTE — Progress Notes (Signed)
   Subjective:    Patient ID: Jamie Mcgee, female    DOB: 1960/11/08, 60 y.o.   MRN: [id number]  HPI  60 year old smoker for follow-up of gold II COPD and OSA She smoked more than 60 pack years she quit multiple times, longest for 7 months using nicotine patch and vaping.  Chantix gave her nightmares  Chief Complaint  Patient presents with   Follow-up    Wants to check into full mask for cpap since having teeth pulled.    Nicotrol inhaler burnt her throat Down to 3/4 PPD We reviewed PFTs and low-dose CT chest today. Breathing is improved, she really liked Breztri and this is helped improve her breathing. We reviewed home sleep test which showed moderate OSA.  She was started on CPAP and this really helped improve her hypersomnolence and fatigue.  She had her teeth pulled and dentures are planned due to pain and being a mouth breather she, she is not able to tolerate the full facemask and requests alternative  Significant tests/ events reviewed  LDCT chest 11/2020 paraseptal emphysema, right upper lobe 4 mm nodule, nodular liver  02/2021 HST mod  OSA with AHI 20/ hr   03/2021 PFTs show moderate restriction, ratio 75, FEV1 1.37/54%, FVC 56%, DLCO 11.5/56%  12/2017 PFTs ratio 73, FEV1 1.41/54%, FVC 57%, no bronchodilator response, TLC 103%, DLCO 80%.  09/2017 spirometry moderate restriction, ratio 76, FEV1 54%, FVC 55%   07/2017 spirometry moderate airway obstruction, ratio 65, FEV1 38%, FVC  46%   N PSG 01/2018-206 pounds-AHI 7/hour lowest desaturation 88%  Review of Systems neg for any significant sore throat, dysphagia, itching, sneezing, nasal congestion or excess/ purulent secretions, fever, chills, sweats, unintended wt loss, pleuritic or exertional cp, hempoptysis, orthopnea pnd or change in chronic leg swelling. Also denies presyncope, palpitations, heartburn, abdominal pain, nausea, vomiting, diarrhea or change in bowel or urinary habits, dysuria,hematuria, rash, arthralgias,  visual complaints, headache, numbness weakness or ataxia.     Objective:   Physical Exam  Gen. Pleasant, obese, in no distress ENT - no lesions, no post nasal drip Neck: No JVD, no thyromegaly, no carotid bruits Lungs: no use of accessory muscles, no dullness to percussion, decreased without rales or rhonchi  Cardiovascular: Rhythm regular, heart sounds  normal, no murmurs or gallops, no peripheral edema Musculoskeletal: No deformities, no cyanosis or clubbing , no tremors Skin- easy bruising      Assessment & Plan:

## 2021-05-12 NOTE — Assessment & Plan Note (Signed)
Emphysema noted on CT, PFTs show more restriction rather than obstruction but DLCO is decreased suggesting component of emphysema.  She has benefited from [place] and will continue this. She will use albuterol only on an as-needed basis

## 2021-05-12 NOTE — Assessment & Plan Note (Signed)
Smoking cessation again emphasized is the most important intervention that would add years to her life. We reviewed low-dose CT which shows 4 mm nodules.  She will need follow-up CT chest in 1 year

## 2021-05-12 NOTE — Assessment & Plan Note (Signed)
CPAP download was reviewed and this shows good control of events on auto settings 5 to 15 cm with average pressure of 14 cm.  She is compliant except for the last 2 to 3 weeks due to her dental issues. I have asked her to trial nasal mask with a chinstrap which may be easier to tolerate than a full facemask until her dental pain improves. I doubt she will tolerate nasal pillows due to the high pressure required.  Weight loss encouraged, compliance with goal of at least 4-6 hrs every night is the expectation. Advised against medications with sedative side effects Cautioned against driving when sleepy - understanding that sleepiness will vary on a day to day basis

## 2021-05-13 DIAGNOSIS — M85851 Other specified disorders of bone density and structure, right thigh: Secondary | ICD-10-CM | POA: Diagnosis not present

## 2021-05-13 DIAGNOSIS — Z780 Asymptomatic menopausal state: Secondary | ICD-10-CM | POA: Diagnosis not present

## 2021-06-11 DIAGNOSIS — G4733 Obstructive sleep apnea (adult) (pediatric): Secondary | ICD-10-CM | POA: Diagnosis not present

## 2021-06-20 ENCOUNTER — Other Ambulatory Visit: Payer: Self-pay

## 2021-06-20 ENCOUNTER — Encounter (HOSPITAL_COMMUNITY): Payer: Self-pay

## 2021-06-20 ENCOUNTER — Emergency Department (HOSPITAL_COMMUNITY): Payer: Medicare Other

## 2021-06-20 ENCOUNTER — Emergency Department (HOSPITAL_COMMUNITY)
Admission: EM | Admit: 2021-06-20 | Discharge: 2021-06-21 | Disposition: A | Discharge status: Home / Self Care | Payer: Medicare Other | Attending: Emergency Medicine | Admitting: Emergency Medicine

## 2021-06-20 DIAGNOSIS — Z79899 Other long term (current) drug therapy: Secondary | ICD-10-CM | POA: Diagnosis not present

## 2021-06-20 DIAGNOSIS — R0602 Shortness of breath: Secondary | ICD-10-CM | POA: Insufficient documentation

## 2021-06-20 DIAGNOSIS — M25512 Pain in left shoulder: Secondary | ICD-10-CM | POA: Diagnosis not present

## 2021-06-20 DIAGNOSIS — R062 Wheezing: Secondary | ICD-10-CM | POA: Insufficient documentation

## 2021-06-20 DIAGNOSIS — R079 Chest pain, unspecified: Secondary | ICD-10-CM | POA: Diagnosis not present

## 2021-06-20 DIAGNOSIS — R0789 Other chest pain: Secondary | ICD-10-CM | POA: Diagnosis not present

## 2021-06-20 DIAGNOSIS — F17210 Nicotine dependence, cigarettes, uncomplicated: Secondary | ICD-10-CM | POA: Insufficient documentation

## 2021-06-20 DIAGNOSIS — I10 Essential (primary) hypertension: Secondary | ICD-10-CM | POA: Diagnosis not present

## 2021-06-20 DIAGNOSIS — J9811 Atelectasis: Secondary | ICD-10-CM | POA: Diagnosis not present

## 2021-06-20 DIAGNOSIS — D72829 Elevated white blood cell count, unspecified: Secondary | ICD-10-CM | POA: Diagnosis not present

## 2021-06-20 DIAGNOSIS — J439 Emphysema, unspecified: Secondary | ICD-10-CM | POA: Diagnosis not present

## 2021-06-20 DIAGNOSIS — Z7982 Long term (current) use of aspirin: Secondary | ICD-10-CM | POA: Diagnosis not present

## 2021-06-20 DIAGNOSIS — J449 Chronic obstructive pulmonary disease, unspecified: Secondary | ICD-10-CM | POA: Insufficient documentation

## 2021-06-20 LAB — BASIC METABOLIC PANEL
Anion gap: 7 (ref 5–15)
BUN: 7 mg/dL — ABNORMAL LOW (ref 8–23)
CO2: 32 mmol/L (ref 22–32)
Calcium: 8.8 mg/dL — ABNORMAL LOW (ref 8.9–10.3)
Chloride: 92 mmol/L — ABNORMAL LOW (ref 98–111)
Creatinine, Ser: 0.87 mg/dL (ref 0.44–1.00)
GFR, Estimated: >60 mL/min (ref >60)
Glucose, Bld: 85 mg/dL (ref 70–99)
Potassium: 3.4 mmol/L — ABNORMAL LOW (ref 3.5–5.1)
Sodium: 131 mmol/L — ABNORMAL LOW (ref 135–145)

## 2021-06-20 LAB — CBC
HCT: 44.4 % (ref 36.0–46.0)
Hemoglobin: 15.1 g/dL — ABNORMAL HIGH (ref 12.0–15.0)
MCH: 30.2 pg (ref 26.0–34.0)
MCHC: 34 g/dL (ref 30.0–36.0)
MCV: 88.8 fL (ref 80.0–100.0)
Platelets: 228 10*3/uL (ref 150–400)
RBC: 5 MIL/uL (ref 3.87–5.11)
RDW: 13.1 % (ref 11.5–15.5)
WBC: 13 10*3/uL — ABNORMAL HIGH (ref 4.0–10.5)
nRBC: 0 % (ref 0.0–0.2)

## 2021-06-20 LAB — TROPONIN I (HIGH SENSITIVITY)
Troponin I (High Sensitivity): 4 ng/L (ref <18)
Troponin I (High Sensitivity): 4 ng/L (ref <18)

## 2021-06-20 MED ORDER — IPRATROPIUM-ALBUTEROL 0.5-2.5 (3) MG/3ML IN SOLN
3.0000 mL | Freq: Once | RESPIRATORY_TRACT | Status: AC
  Administered 2021-06-20: 3 mL via RESPIRATORY_TRACT
  Filled 2021-06-20: qty 3

## 2021-06-20 MED ORDER — IOHEXOL 350 MG/ML SOLN
100.0000 mL | Freq: Once | INTRAVENOUS | Status: AC | PRN
Start: 2021-06-20 — End: 2021-06-20
  Administered 2021-06-20: 100 mL via INTRAVENOUS

## 2021-06-20 NOTE — ED Triage Notes (Signed)
Left shoulder pain with left arm radiation. Shortness of breath and like her heart feels "funky"  This all started around 30 minutes pta Has never happened before. Says that she is not on any blood thinners.

## 2021-06-20 NOTE — ED Provider Notes (Signed)
[facility] EMERGENCY DEPARTMENT Provider Note   CSN: [id number] Arrival date & time: 06/20/21  1839     History  Chief Complaint  Patient presents with   Chest Pain    Jamie Mcgee is a 61 y.o. female.   Chest Pain Associated symptoms: shortness of breath   Associated symptoms: no abdominal pain, no back pain, no cough, no fatigue, no fever, no headache, no nausea, no numbness, no palpitations, no vomiting and no weakness   Patient presenting for chest pain.  Had onset was 3 hours ago.  Pain radiates to left arm.  She has associated shortness of breath.  Per chart review, has not had any cardiac testing in the past 4 years.  In January 2019, she did have a nuc med study.  Findings were reassuring at that time.  Medical history is notable for COPD, depression, anxiety, HTN, and current cigarette smoking. HPI: A 60 year old patient with a history of hypertension and obesity presents for evaluation of chest pain. Initial onset of pain was less than one hour ago. The patient's chest pain is described as heaviness/pressure/tightness and is not worse with exertion. The patient's chest pain is middle- or left-sided, is not well-localized, is not sharp and does radiate to the arms/jaw/neck. The patient does not complain of nausea and denies diaphoresis. The patient has smoked in the past 90 days and has a family history of coronary artery disease in a first-degree relative with onset less than age 63. The patient has no history of stroke, has no history of peripheral artery disease, denies any history of treated diabetes and has no history of hypercholesterolemia.   Home Medications Prior to Admission medications   Medication Sig Start Date End Date Taking? Authorizing Provider  albuterol (VENTOLIN HFA) 108 (90 Base) MCG/ACT inhaler Inhale 2 puffs into the lungs every 6 (six) hours as needed for wheezing or shortness of breath. 12/31/20   Rigoberto Noel, MD  aspirin EC 81 MG tablet Take 81 mg  by mouth daily. Swallow whole.    [provider]  Budeson-Glycopyrrol-Formoterol (BREZTRI AEROSPHERE) 160-9-4.8 MCG/ACT AERO Inhale 2 puffs into the lungs 2 (two) times daily. 01/17/21   Rigoberto Noel, MD  busPIRone (BUSPAR) 10 MG tablet Take 1 tablet (10 mg total) by mouth 3 (three) times daily. 04/13/21   [name], DO  CALCIUM-VITAMIN D PO Take by mouth.    [provider]  carvedilol (COREG) 6.25 MG tablet Take 1 tablet (6.25 mg total) by mouth 2 (two) times daily with a meal. 04/13/21   Gottschalk, Ashly M, DO  citalopram (CELEXA) 40 MG tablet Take 1 tablet (40 mg total) by mouth daily. 04/13/21   [name], DO  fexofenadine (ALLEGRA) 180 MG tablet Take 180 mg by mouth daily.    [provider]  hydrochlorothiazide (HYDRODIURIL) 25 MG tablet Take 1 tablet (25 mg total) by mouth daily. 04/13/21   [name], DO  losartan (COZAAR) 100 MG tablet Take 1 tablet (100 mg total) by mouth daily. 04/13/21   [name], DO  nitroGLYCERIN (NITROSTAT) 0.4 MG SL tablet Place 1 tablet (0.4 mg total) under the tongue every 5 (five) minutes as needed. 04/13/21   [name], DO  pantoprazole (PROTONIX) 20 MG tablet Take 1 tablet (20 mg total) by mouth daily. For acid reflux 04/13/21   Ronnie Doss M, DO  pregabalin (LYRICA) 150 MG capsule Take 150mg  each morning and 300mg  each evening. Pharmacy: Cancel previous  rx 04/13/21   [name], DO  rosuvastatin (CRESTOR) 5 MG tablet Take 1 tablet (5 mg total) by mouth at bedtime. 04/13/21   [name], DO      Allergies    Lipitor [atorvastatin] and Naproxen    Review of Systems   Review of Systems  Constitutional:  Negative for activity change, appetite change, chills, fatigue and fever.  HENT:  Negative for congestion, ear pain and sore throat.   Eyes:  Negative for pain and visual disturbance.  Respiratory:  Positive for chest tightness and shortness of breath. Negative for  cough.   Cardiovascular:  Positive for chest pain. Negative for palpitations.  Gastrointestinal:  Negative for abdominal pain, diarrhea, nausea and vomiting.  Genitourinary:  Negative for dysuria, flank pain, hematuria and pelvic pain.  Musculoskeletal:  Negative for arthralgias, back pain, myalgias and neck pain.  Skin:  Negative for color change and rash.  Neurological:  Negative for seizures, syncope, weakness, numbness and headaches.  Hematological:  Does not bruise/bleed easily.  Psychiatric/Behavioral:  Negative for confusion and decreased concentration.   All other systems reviewed and are negative.  Physical Exam Updated Vital Signs BP 129/79    Pulse 65    Temp 98.1 F (36.7 C)    Resp 18    Ht 5\' 4"  (1.626 m)    Wt 94.8 kg    LMP 05/25/2011    SpO2 95%    BMI 35.87 kg/m  Physical Exam Vitals and nursing note reviewed.  Constitutional:      General: She is not in acute distress.    Appearance: She is well-developed. She is not ill-appearing, toxic-appearing or diaphoretic.  HENT:     Head: Normocephalic and atraumatic.  Eyes:     Conjunctiva/sclera: Conjunctivae normal.  Cardiovascular:     Rate and Rhythm: Normal rate and regular rhythm.     Heart sounds: No murmur heard. Pulmonary:     Effort: Pulmonary effort is normal. No respiratory distress.     Breath sounds: Wheezing present. No decreased breath sounds, rhonchi or rales.  Chest:     Chest wall: No tenderness or edema.  Abdominal:     Palpations: Abdomen is soft.     Tenderness: There is no abdominal tenderness.  Musculoskeletal:        General: No swelling.     Cervical back: Normal range of motion and neck supple.     Right lower leg: No edema.     Left lower leg: No edema.  Skin:    General: Skin is warm and dry.     Capillary Refill: Capillary refill takes less than 2 seconds.  Neurological:     General: No focal deficit present.     Mental Status: She is alert and oriented to person, place, and time.   Psychiatric:        Mood and Affect: Mood normal.        Behavior: Behavior normal.    ED Results / Procedures / Treatments   Labs (all labs ordered are listed, but only abnormal results are displayed) Labs Reviewed  BASIC METABOLIC PANEL - Abnormal; Notable for the following components:      Result Value   Sodium 131 (*)    Potassium 3.4 (*)    Chloride 92 (*)    BUN 7 (*)    Calcium 8.8 (*)    All other components within normal limits  CBC - Abnormal; Notable for the following components:  WBC 13.0 (*)    Hemoglobin 15.1 (*)    All other components within normal limits  TROPONIN I (HIGH SENSITIVITY)  TROPONIN I (HIGH SENSITIVITY)    EKG EKG Interpretation  Date/Time:  Monday June 20 2021 22:06:48 EST Ventricular Rate:  68 PR Interval:  135 QRS Duration: 70 QT Interval:  448 QTC Calculation: 477 R Axis:   94 Text Interpretation: Sinus rhythm Right axis deviation Anteroseptal infarct, old Nonspecific T abnormalities, lateral leads No significant change since last tracing Confirmed by Varney Biles [phone]) on 06/21/2021 2:08:54 PM  Radiology DG Chest 2 View  Result Date: 06/20/2021 CLINICAL DATA:  CP. Left shoulder pain with left arm radiation. Shortness of breath and like her heart feels "funky" Hx of COPD and emphysema EXAM: CHEST - 2 VIEW COMPARISON:  Chest x-ray 09/18/2017 FINDINGS: The heart and mediastinal contours are unchanged. Aortic calcification. Query interval developing right middle lobe airspace opacity. Chronic coarsened history markings with no overt pulmonary edema. No pleural effusion. No pneumothorax. No acute osseous abnormality. IMPRESSION: 1. Query interval developing right middle lobe airspace opacity. Followup PA and lateral chest X-ray is recommended in 3-4 weeks following therapy to ensure resolution and exclude underlying malignancy. 2. Aortic Atherosclerosis (ICD10-I70.0) and Emphysema (ICD10-J43.9). Electronically Signed   By: Iven Finn  M.D.   On: 06/20/2021 20:22   CT Angio Chest PE W and/or Wo Contrast  Result Date: 06/20/2021 CLINICAL DATA:  Shortness of breath and left shoulder pain. EXAM: CT ANGIOGRAPHY CHEST WITH CONTRAST TECHNIQUE: Multidetector CT imaging of the chest was performed using the standard protocol during bolus administration of intravenous contrast. Multiplanar CT image reconstructions and MIPs were obtained to evaluate the vascular anatomy. RADIATION DOSE REDUCTION: This exam was performed according to the departmental dose-optimization program which includes automated exposure control, adjustment of the mA and/or kV according to patient size and/or use of iterative reconstruction technique. CONTRAST:  168mL OMNIPAQUE IOHEXOL 350 MG/ML SOLN COMPARISON:  November 11, 2020 FINDINGS: Cardiovascular: Satisfactory opacification of the pulmonary arteries to the segmental level. No evidence of pulmonary embolism. Normal heart size. No pericardial effusion. Mediastinum/Nodes: No enlarged mediastinal, hilar, or axillary lymph nodes. Thyroid gland, trachea, and esophagus demonstrate no significant findings. Lungs/Pleura: Mild emphysematous lung disease is seen involving the bilateral upper lobes. Mild atelectasis is noted along the posteromedial aspect of the right apex. There is no evidence of acute infiltrate, pleural effusion or pneumothorax. Upper Abdomen: Early cirrhotic changes are seen involving the liver. Musculoskeletal: No chest wall abnormality. No acute or significant osseous findings. Review of the MIP images confirms the above findings. IMPRESSION: 1. No evidence of pulmonary embolism or other acute intrathoracic process. 2. Mild emphysematous lung disease. 3. Early cirrhotic changes of the liver. Emphysema (ICD10-J43.9). Electronically Signed   By: Virgina Norfolk M.D.   On: 06/20/2021 22:42    Procedures Procedures    Medications Ordered in ED Medications  ipratropium-albuterol (DUONEB) 0.5-2.5 (3) MG/3ML  nebulizer solution 3 mL (3 mLs Nebulization Given 06/20/21 2258)  iohexol (OMNIPAQUE) 350 MG/ML injection 100 mL (100 mLs Intravenous Contrast Given 06/20/21 2228)    ED Course/ Medical Decision Making/ A&P   HEAR Score: 5                       Medical Decision Making Amount and/or Complexity of Data Reviewed Labs: ordered. Radiology: ordered.  Risk Prescription drug management.   This patient presents to the ED for concern of chest pain, this involves an extensive  number of treatment options, and is a complaint that carries with it a high risk of complications and morbidity.  The differential diagnosis includes ACS, COPD exacerbation, PE, pneumonia, GERD, musculoskeletal etiology    Co morbidities that complicate the patient evaluation  COPD, HTN, obesity, OSA, GERD, HLD   Additional history obtained:  Additional history obtained from N/A External records from outside source obtained and reviewed including EMR   Lab Tests:  I Ordered, and personally interpreted labs.  The pertinent results include: Mild leukocytosis, normal troponins   Imaging Studies ordered:  I ordered imaging studies including chest x-ray, CTA chest I independently visualized and interpreted imaging which showed no evidence of PE, no evidence of acute pneumonia, chronic findings of emphysematous change of lungs and cirrhotic change of liver. I agree with the radiologist interpretation   Cardiac Monitoring:  The patient was maintained on a cardiac monitor.  I personally viewed and interpreted the cardiac monitored which showed an underlying rhythm of: Sinus rhythm   Medicines ordered and prescription drug management:  I ordered medication including DuoNeb for wheezing Reevaluation of the patient after these medicines showed that the patient improved I have reviewed the patients home medicines and have made adjustments as needed    Consultations Obtained:  I requested consultation with the  cardiology,  and discussed lab and imaging findings as well as pertinent plan - they recommend: Outpatient follow-up   Problem List / ED Course:  Pleasant 61 year old female presenting for acute onset of left-sided chest pain that radiates to posterior shoulder.  EKG shows no evidence of ischemia.  Patient will laboratory work-up which shows normal troponins x2.  CTA of chest was also obtained which did not show any evidence of PE or pneumonia.  Based on symptoms, EKG, and risk factors, patient is a heart score of 5.  I did discuss with cardiology who does agree with plan of discharge with outpatient follow-up.  Patient was given DuoNeb for wheezing.  She reported improvement in shortness of breath as well as her chest/back pain.  Patient was advised to follow-up with cardiology for possible further testing and to return to the ED for a recurrence or worsening of symptoms.   Reevaluation:  After the interventions noted above, I reevaluated the patient and found that they have :improved   Social Determinants of Health:  Patient has medical insurance and access to outpatient medical care.   Dispostion:  After consideration of the diagnostic results and the patients response to treatment, I feel that the patent would benefit from discharge with close cardiology follow-up.          Final Clinical Impression(s) / ED Diagnoses Final diagnoses:  Chest pain, unspecified type    Rx / DC Orders ED Discharge Orders          Ordered    Ambulatory referral to Cardiology        06/20/21 2333              Godfrey Pick, MD 06/22/21 [phone]

## 2021-06-29 ENCOUNTER — Other Ambulatory Visit: Payer: Self-pay | Admitting: Pulmonary Disease

## 2021-06-30 ENCOUNTER — Telehealth: Payer: Self-pay | Admitting: Family Medicine

## 2021-06-30 NOTE — Telephone Encounter (Signed)
Needs ER follow up. Pt was seen 1/22 and wants to know if she still needs a follow up.

## 2021-06-30 NOTE — Telephone Encounter (Signed)
Spoke with patient, Dr. Lajuana Ripple does not have any appointments available anytime soon so an appointment was scheduled with [facility] tomorrow.

## 2021-07-01 ENCOUNTER — Ambulatory Visit (INDEPENDENT_AMBULATORY_CARE_PROVIDER_SITE_OTHER): Payer: Medicare Other | Admitting: Family

## 2021-07-01 ENCOUNTER — Encounter: Payer: Self-pay | Admitting: Family

## 2021-07-01 VITALS — BP 105/71 | HR 72 | Temp 97.8°F | Resp 95 | Ht 64.0 in | Wt 205.4 lb

## 2021-07-01 DIAGNOSIS — F17210 Nicotine dependence, cigarettes, uncomplicated: Secondary | ICD-10-CM | POA: Diagnosis not present

## 2021-07-01 DIAGNOSIS — E7800 Pure hypercholesterolemia, unspecified: Secondary | ICD-10-CM | POA: Diagnosis not present

## 2021-07-01 DIAGNOSIS — I10 Essential (primary) hypertension: Secondary | ICD-10-CM

## 2021-07-01 DIAGNOSIS — Z09 Encounter for follow-up examination after completed treatment for conditions other than malignant neoplasm: Secondary | ICD-10-CM | POA: Diagnosis not present

## 2021-07-01 DIAGNOSIS — M25512 Pain in left shoulder: Secondary | ICD-10-CM

## 2021-07-01 NOTE — Patient Instructions (Signed)
Managing the Challenge of Quitting Smoking ?Quitting smoking is a physical and mental challenge. You will face cravings, withdrawal symptoms, and temptation. Before quitting, work with your health care provider to make a plan that can help you manage quitting. Preparation can help you quit and keep you from giving in. ?How to manage lifestyle changes ?Managing stress ?Stress can make you want to smoke, and wanting to smoke may cause stress. It is important to find ways to manage your stress. You might try some of the following: ?Practice relaxation techniques. ?Breathe slowly and deeply, in through your nose and out through your mouth. ?Listen to music. ?Soak in a bath or take a shower. ?Imagine a peaceful place or vacation. ?Get some support. ?Talk with family or friends about your stress. ?Join a support group. ?Talk with a counselor or therapist. ?Get some physical activity. ?Go for a walk, run, or bike ride. ?Play a favorite sport. ?Practice yoga. ? ?Medicines ?Talk with your health care provider about medicines that might help you deal with cravings and make quitting easier for you. ?Relationships ?Social situations can be difficult when you are quitting smoking. To manage this, you can: ?Avoid parties and other social situations where people might be smoking. ?Avoid alcohol. ?Leave right away if you have the urge to smoke. ?Explain to your family and friends that you are quitting smoking. Ask for support and let them know you might be a bit grumpy. ?Plan activities where smoking is not an option. ?General instructions ?Be aware that many people gain weight after they quit smoking. However, not everyone does. To keep from gaining weight, have a plan in place before you quit and stick to the plan after you quit. Your plan should include: ?Having healthy snacks. When you have a craving, it may help to: ?Eat popcorn, carrots, celery, or other cut vegetables. ?Chew sugar-free gum. ?Changing how you eat. ?Eat small  portion sizes at meals. ?Eat 4-6 small meals throughout the day instead of 1-2 large meals a day. ?Be mindful when you eat. Do not watch television or do other things that might distract you as you eat. ?Exercising regularly. ?Make time to exercise each day. If you do not have time for a long workout, do short bouts of exercise for 5-10 minutes several times a day. ?Do some form of strengthening exercise, such as weight lifting. ?Do some exercise that gets your heart beating and causes you to breathe deeply, such as walking fast, running, swimming, or biking. This is very important. ?Drinking plenty of water or other low-calorie or no-calorie drinks. Drink 6-8 glasses of water daily. ? ?How to recognize withdrawal symptoms ?Your body and mind may experience discomfort as you try to get used to not having nicotine in your system. These effects are called withdrawal symptoms. They may include: ?Feeling hungrier than normal. ?Having trouble concentrating. ?Feeling irritable or restless. ?Having trouble sleeping. ?Feeling depressed. ?Craving a cigarette. ?To manage withdrawal symptoms: ?Avoid places, people, and activities that trigger your cravings. ?Remember why you want to quit. ?Get plenty of sleep. ?Avoid coffee and other caffeinated drinks. These may worsen some of your symptoms. ?These symptoms may surprise you. But be assured that they are normal to have when quitting smoking. ?How to manage cravings ?Come up with a plan for how to deal with your cravings. The plan should include the following: ?A definition of the specific situation you want to deal with. ?An alternative action you will take. ?A clear idea for how this action   will help. ?The name of someone who might help you with this. ?Cravings usually last for 5-10 minutes. Consider taking the following actions to help you with your plan to deal with cravings: ?Keep your mouth busy. ?Chew sugar-free gum. ?Suck on hard candies or a straw. ?Brush your  teeth. ?Keep your hands and body busy. ?Change to a different activity right away. ?Squeeze or play with a ball. ?Do an activity or a hobby, such as making bead jewelry, practicing needlepoint, or working with wood. ?Mix up your normal routine. ?Take a short exercise break. Go for a quick walk or run up and down stairs. ?Focus on doing something kind or helpful for someone else. ?Call a friend or family member to talk during a craving. ?Join a support group. ?Contact a quitline. ?Where to find support ?To get help or find a support group: ?Call the National Cancer Institute's Smoking Quitline: 1-800-QUIT NOW (784-8669) ?Visit the website of the Substance Abuse and Mental Health Services Administration: www.samhsa.gov ?Text QUIT to SmokefreeTXT: 478848 ?Where to find more information ?Visit these websites to find more information on quitting smoking: ?National Cancer Institute: www.smokefree.gov ?American Lung Association: www.lung.org ?American Cancer Society: www.cancer.org ?Centers for Disease Control and Prevention: www.cdc.gov ?American Heart Association: www.heart.org ?Contact a health care provider if: ?You want to change your plan for quitting. ?The medicines you are taking are not helping. ?Your eating feels out of control or you cannot sleep. ?Get help right away if: ?You feel depressed or become very anxious. ?Summary ?Quitting smoking is a physical and mental challenge. You will face cravings, withdrawal symptoms, and temptation to smoke again. Preparation can help you as you go through these challenges. ?Try different techniques to manage stress, handle social situations, and prevent weight gain. ?You can deal with cravings by keeping your mouth busy (such as by chewing gum), keeping your hands and body busy, calling family or friends, or contacting a quitline for people who want to quit smoking. ?You can deal with withdrawal symptoms by avoiding places where people smoke, getting plenty of rest, and  avoiding drinks with caffeine. ?This information is not intended to replace advice given to you by your health care provider. Make sure you discuss any questions you have with your health care provider. ?Document Revised: 01/28/2021 Document Reviewed: 03/11/2019 ?Elsevier Patient Education ? 2022 [employer] ? ?

## 2021-07-01 NOTE — Progress Notes (Signed)
Acute Office Visit  Subjective:    Patient ID: Jamie Mcgee, female    DOB: 26-Jul-1960, 61 y.o.   MRN: [id number]  Chief Complaint  Patient presents with   Hospitalization Follow-up    HPI Patient is in today for hospital follow up on chest pain. She was having chest pain, SOB, and that radiates down left arm that started on 06/20/21. She had EKG, chest x-ray, and CTA chest. Negative PE, pneumonia. Did show COPD and cirrhotic changes of liver.  She had negative troponins X2.   She reports her shoulder pain resolved that day. She continues to have intermittent SOB because of her COPD. She continues to smoke, but trying to quit. She reports she is on her last pack and is quitting.   She has  hx of HTN, hyperlipidemia, current smoker, and morbid obese.   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   Depression    Fibromuscular dysplasia of renal artery ([place]) 2006   GERD (gastroesophageal reflux disease)    Headache    migraines 20 years ago   Hepatitis    type A in 1977   HNP (herniated nucleus pulposus), cervical    HPV (human papilloma virus) infection    HTN (hypertension)    Neuropathy    Vitamin D deficiency     Past Surgical History:  Procedure Laterality Date   ANTERIOR CERVICAL DECOMP/DISCECTOMY FUSION N/A 10/05/2017   Procedure: C5-6 ANTERIOR CERVICAL DECOMPRESSION/DISCECTOMY FUSION,  ALLOGRAFT, PLATE  (NECK FIRST AND CARPAL TUNNEL RELEASE SECOND);  Surgeon: Marybelle Killings, MD;  Location: [place];  Service: Orthopedics;  Laterality: N/A;   APPENDECTOMY  1993   BREAST BIOPSY Right    CARPAL TUNNEL RELEASE Right 10/05/2017   Procedure: RIGHT CARPAL TUNNEL RELEASE;  Surgeon: Marybelle Killings, MD;  Location: [place];  Service: Orthopedics;  Laterality: Right;   CATARACT EXTRACTION  08-2010   left    CATARACT EXTRACTION  W/PHACO  06/01/2011   Procedure: CATARACT EXTRACTION PHACO AND INTRAOCULAR LENS PLACEMENT (IOC);  Surgeon: Tonny Branch;  Location: [facility];  Service: Ophthalmology;  Laterality: Right;  CDE:9.62   CHOLECYSTECTOMY     COLONOSCOPY WITH PROPOFOL N/A 07/06/2020   Procedure: COLONOSCOPY WITH PROPOFOL;  Surgeon: Eloise Harman, DO;  Location: [facility]COPY;  Service: Endoscopy;  Laterality: N/A;  11:00am   RENAL ARTERY ANGIOPLASTY  2006   right   TUBAL LIGATION  1984   [facility]    Family History  Problem Relation Age of Onset   Coronary artery disease Father        States congenital abnormality   Diabetes Father    Hyperlipidemia Father    CAD Sister        Stents    Cancer Sister        breast   Cancer Mother        breast   Cancer Maternal Aunt        breast   Cancer Maternal Grandmother        breast   Anesthesia problems Neg Hx    Hypotension Neg Hx    Malignant hyperthermia Neg Hx    Pseudochol deficiency Neg Hx     Social History   Socioeconomic History   Marital status: Divorced  Spouse name: Not on file   Number of children: Not on file   Years of education: Not on file   Highest education level: Not on file  Occupational History   Not on file  Tobacco Use   Smoking status: Every Day    Packs/day: 1.00    Years: 40.00    Pack years: 40.00    Types: Cigarettes   Smokeless tobacco: Never   Tobacco comments:    3/4 of one ppd 05-12-21  Vaping Use   Vaping Use: Never used  Substance and Sexual Activity   Alcohol use: No    Alcohol/week: 0.0 standard drinks   Drug use: Yes    Frequency: 0.5 times per week    Types: Marijuana    Comment: weekly   Sexual activity: Not Currently    Birth control/protection: Post-menopausal  Other Topics Concern   Not on file  Social History Narrative   The patient does not have any sex drive and has not had one for almost a year.   Her   husband thinks she does not love him anymore.  He does not seem to understand   her    depression.   Social Determinants of Health   Financial Resource Strain: Medium Risk   Difficulty of Paying Living Expenses: Somewhat hard  Food Insecurity: No Food Insecurity   Worried About Charity fundraiser in the Last Year: Never true   Ran Out of Food in the Last Year: Never true  Transportation Needs: No Transportation Needs   Lack of Transportation (Medical): No   Lack of Transportation (Non-Medical): No  Physical Activity: Inactive   Days of Exercise per Week: 0 days   Minutes of Exercise per Session: 0 min  Stress: No Stress Concern Present   Feeling of Stress : Only a little  Social Connections: Socially Isolated   Frequency of Communication with Friends and Family: More than three times a week   Frequency of Social Gatherings with Friends and Family: Twice a week   Attends Religious Services: Never   Marine scientist or Organizations: No   Attends Music therapist: Never   Marital Status: Divorced  Human resources officer Violence: Not At Risk   Fear of Current or Ex-Partner: No   Emotionally Abused: No   Physically Abused: No   Sexually Abused: No    Outpatient Medications Prior to Visit  Medication Sig Dispense Refill   albuterol (VENTOLIN HFA) 108 (90 Base) MCG/ACT inhaler Inhale 2 puffs into the lungs every 6 (six) hours as needed for wheezing or shortness of breath. 8 g 3   aspirin EC 81 MG tablet Take 81 mg by mouth daily. Swallow whole.     Budeson-Glycopyrrol-Formoterol (BREZTRI AEROSPHERE) 160-9-4.8 MCG/ACT AERO Inhale 2 puffs into the lungs 2 (two) times daily. 10.7 g 5   busPIRone (BUSPAR) 10 MG tablet Take 1 tablet (10 mg total) by mouth 3 (three) times daily. 270 tablet 3   CALCIUM-VITAMIN D PO Take by mouth.     carvedilol (COREG) 6.25 MG tablet Take 1 tablet (6.25 mg total) by mouth 2 (two) times daily with a meal. 180 tablet 3   citalopram (CELEXA) 40 MG tablet Take 1 tablet (40 mg total) by mouth daily. 90 tablet 3   fexofenadine  (ALLEGRA) 180 MG tablet Take 180 mg by mouth daily.     hydrochlorothiazide (HYDRODIURIL) 25 MG tablet Take 1 tablet (25 mg total) by mouth daily. 90 tablet 3  losartan (COZAAR) 100 MG tablet Take 1 tablet (100 mg total) by mouth daily. 90 tablet 3   nitroGLYCERIN (NITROSTAT) 0.4 MG SL tablet Place 1 tablet (0.4 mg total) under the tongue every 5 (five) minutes as needed. 25 tablet 3   pantoprazole (PROTONIX) 20 MG tablet Take 1 tablet (20 mg total) by mouth daily. For acid reflux 90 tablet 3   pregabalin (LYRICA) 150 MG capsule Take 12m each morning and 3041meach evening. Pharmacy: Cancel previous rx 270 capsule 1   rosuvastatin (CRESTOR) 5 MG tablet Take 1 tablet (5 mg total) by mouth at bedtime. 90 tablet 3   No facility-administered medications prior to visit.    Allergies  Allergen Reactions   Lipitor [Atorvastatin] Other (See Comments)    Muscle aches and nausea   Naproxen Nausea And Vomiting    Stomach pain    Review of Systems  Respiratory:  Positive for cough, chest tightness and shortness of breath.   Cardiovascular:  Negative for palpitations and leg swelling.  All other systems reviewed and are negative.     Objective:    Physical Exam Vitals reviewed.  Constitutional:      General: She is not in acute distress.    Appearance: She is well-developed. She is obese.  HENT:     Head: Normocephalic and atraumatic.     Right Ear: Tympanic membrane normal.     Left Ear: Tympanic membrane normal.  Eyes:     Pupils: Pupils are equal, round, and reactive to light.  Neck:     Thyroid: No thyromegaly.  Cardiovascular:     Rate and Rhythm: Regular rhythm. Tachycardia present.     Heart sounds: Normal heart sounds. No murmur heard. Pulmonary:     Effort: Pulmonary effort is normal. No respiratory distress.     Breath sounds: Normal breath sounds. No wheezing.  Abdominal:     General: Bowel sounds are normal. There is no distension.     Palpations: Abdomen is soft.      Tenderness: There is no abdominal tenderness.  Musculoskeletal:        General: No tenderness. Normal range of motion.     Cervical back: Normal range of motion and neck supple.  Skin:    General: Skin is warm and dry.  Neurological:     Mental Status: She is alert and oriented to person, place, and time.     Cranial Nerves: No cranial nerve deficit.     Deep Tendon Reflexes: Reflexes are normal and symmetric.  Psychiatric:        Behavior: Behavior normal.        Thought Content: Thought content normal.        Judgment: Judgment normal.    BP 105/71    Pulse 72    Temp 97.8 F (36.6 C) (Temporal)    Resp (!) 95    Ht 5' 4" (1.626 m)    Wt 205 lb 6.4 oz (93.2 kg)    LMP 05/25/2011    BMI 35.26 kg/m  Wt Readings from Last 3 Encounters:  07/01/21 205 lb 6.4 oz (93.2 kg)  06/20/21 209 lb (94.8 kg)  05/12/21 206 lb 0.6 oz (93.5 kg)    Health Maintenance Due  Topic Date Due   COVID-19 Vaccine (2 - Moderna risk series) 11/03/2019    There are no preventive care reminders to display for this patient.   Lab Results  Component Value Date   TSH 1.820 04/13/2021  Lab Results  Component Value Date   WBC 13.0 (H) 06/20/2021   HGB 15.1 (H) 06/20/2021   HCT 44.4 06/20/2021   MCV 88.8 06/20/2021   PLT 228 06/20/2021   Lab Results  Component Value Date   NA 131 (L) 06/20/2021   K 3.4 (L) 06/20/2021   CO2 32 06/20/2021   GLUCOSE 85 06/20/2021   BUN 7 (L) 06/20/2021   CREATININE 0.87 06/20/2021   BILITOT 0.5 04/13/2021   ALKPHOS 108 04/13/2021   AST 27 04/13/2021   ALT 16 04/13/2021   PROT 6.8 04/13/2021   ALBUMIN 3.8 04/13/2021   CALCIUM 8.8 (L) 06/20/2021   ANIONGAP 7 06/20/2021   EGFR 67 04/13/2021   Lab Results  Component Value Date   CHOL 124 04/13/2021   Lab Results  Component Value Date   HDL 58 04/13/2021   Lab Results  Component Value Date   LDLCALC 52 04/13/2021   Lab Results  Component Value Date   TRIG 70 04/13/2021   Lab Results  Component  Value Date   CHOLHDL 2.1 04/13/2021   Lab Results  Component Value Date   HGBA1C 5.6 10/11/2020       Assessment & Plan:   Problem List Items Addressed This Visit       Cardiovascular and Mediastinum   Essential hypertension, benign - Primary   Relevant Orders   Ambulatory referral to Cardiology   CMP14+EGFR   CBC with Differential/Platelet     Other   Pure hypercholesterolemia   Relevant Orders   Ambulatory referral to Cardiology   CMP14+EGFR   CBC with Differential/Platelet   Morbid obesity ([place])   Relevant Orders   Ambulatory referral to Cardiology   CMP14+EGFR   CBC with Differential/Platelet   Cigarette smoker   Relevant Orders   Ambulatory referral to Cardiology   CMP14+EGFR   CBC with Differential/Platelet   Other Visit Diagnoses     Hospital discharge follow-up       Relevant Orders   Ambulatory referral to Cardiology   CMP14+EGFR   CBC with Differential/Platelet   Acute pain of left shoulder       Relevant Orders   Ambulatory referral to Cardiology   CMP14+EGFR   CBC with Differential/Platelet       Follow up with Cardiologist Smoking cessation discussed  Keep follow up with PCP   Evelina Dun, FNP

## 2021-07-02 LAB — CBC WITH DIFFERENTIAL/PLATELET
Basophils Absolute: 0.1 10*3/uL (ref 0.0–0.2)
Basos: 1 %
EOS (ABSOLUTE): 0.1 10*3/uL (ref 0.0–0.4)
Eos: 1 %
Hematocrit: 46.4 % (ref 34.0–46.6)
Hemoglobin: 16 g/dL — ABNORMAL HIGH (ref 11.1–15.9)
Immature Grans (Abs): 0.1 10*3/uL (ref 0.0–0.1)
Immature Granulocytes: 1 %
Lymphocytes Absolute: 3 10*3/uL (ref 0.7–3.1)
Lymphs: 25 %
MCH: 30.5 pg (ref 26.6–33.0)
MCHC: 34.5 g/dL (ref 31.5–35.7)
MCV: 88 fL (ref 79–97)
Monocytes Absolute: 1 10*3/uL — ABNORMAL HIGH (ref 0.1–0.9)
Monocytes: 8 %
Neutrophils Absolute: 8 10*3/uL — ABNORMAL HIGH (ref 1.4–7.0)
Neutrophils: 64 %
Platelets: 237 10*3/uL (ref 150–450)
RBC: 5.25 x10E6/uL (ref 3.77–5.28)
RDW: 12.7 % (ref 11.7–15.4)
WBC: 12.2 10*3/uL — ABNORMAL HIGH (ref 3.4–10.8)

## 2021-07-02 LAB — CMP14+EGFR
ALT: 17 IU/L (ref 0–32)
AST: 26 IU/L (ref 0–40)
Albumin/Globulin Ratio: 1.1 — ABNORMAL LOW (ref 1.2–2.2)
Albumin: 3.9 g/dL (ref 3.8–4.8)
Alkaline Phosphatase: 114 IU/L (ref 44–121)
BUN/Creatinine Ratio: 5 — ABNORMAL LOW (ref 12–28)
BUN: 4 mg/dL — ABNORMAL LOW (ref 8–27)
Bilirubin Total: 0.5 mg/dL (ref 0.0–1.2)
CO2: 32 mmol/L — ABNORMAL HIGH (ref 20–29)
Calcium: 9.3 mg/dL (ref 8.7–10.3)
Chloride: 92 mmol/L — ABNORMAL LOW (ref 96–106)
Creatinine, Ser: 0.86 mg/dL (ref 0.57–1.00)
Globulin, Total: 3.4 g/dL (ref 1.5–4.5)
Glucose: 79 mg/dL (ref 70–99)
Potassium: 3.6 mmol/L (ref 3.5–5.2)
Sodium: 135 mmol/L (ref 134–144)
Total Protein: 7.3 g/dL (ref 6.0–8.5)
eGFR: 77 mL/min/{1.73_m2} (ref >59)

## 2021-07-11 ENCOUNTER — Other Ambulatory Visit: Payer: Self-pay | Admitting: Pulmonary Disease

## 2021-07-12 DIAGNOSIS — G4733 Obstructive sleep apnea (adult) (pediatric): Secondary | ICD-10-CM | POA: Diagnosis not present

## 2021-08-02 ENCOUNTER — Other Ambulatory Visit: Payer: Self-pay | Admitting: Family Medicine

## 2021-08-02 DIAGNOSIS — G8929 Other chronic pain: Secondary | ICD-10-CM

## 2021-08-09 DIAGNOSIS — G4733 Obstructive sleep apnea (adult) (pediatric): Secondary | ICD-10-CM | POA: Diagnosis not present

## 2021-08-10 ENCOUNTER — Ambulatory Visit: Payer: Medicare Other | Admitting: Cardiology

## 2021-08-10 NOTE — Progress Notes (Deleted)
? ? ? ?Clinical Summary ?Ms. Davern is a 61 y.o.female last seen by Dr Celine Mans in 2019, seen today as a new patient ? ? ?1.HTN ? ? ?2. History of chest pain ?- Jan 2019 nuclear stress: no ischemia ?- 03/2015 nuclear stress: no ischemia ? ?-ER visit Jan 2023 with chest pain ?- trop neg x 2. EKG SR, chronic nonspecific ST/T changes. CT PE negative ? ? ?3.DOE ?- history of tobacco abuse ?- PFTS air trapping, decreased perfusion ? ?4. Hyperlipidemia ? ?Past Medical History:  ?Diagnosis Date  ? Anxiety   ? Arthritis   ? Carpal tunnel syndrome on right   ? Cervical stenosis of spine   ? C5-6  ? COPD (chronic obstructive pulmonary disease) ([place])   ? Gold III  ? Depression   ? Fibromuscular dysplasia of renal artery ([place]) 2006  ? GERD (gastroesophageal reflux disease)   ? Headache   ? migraines 20 years ago  ? Hepatitis   ? type A in 1977  ? HNP (herniated nucleus pulposus), cervical   ? HPV (human papilloma virus) infection   ? HTN (hypertension)   ? Neuropathy   ? Vitamin D deficiency   ? ? ? ?Allergies  ?Allergen Reactions  ? Lipitor [Atorvastatin] Other (See Comments)  ?  Muscle aches and nausea  ? Naproxen Nausea And Vomiting  ?  Stomach pain  ? ? ? ?Current Outpatient Medications  ?Medication Sig Dispense Refill  ? albuterol (VENTOLIN HFA) 108 (90 Base) MCG/ACT inhaler Inhale 2 puffs into the lungs every 6 (six) hours as needed for wheezing or shortness of breath. 8 g 3  ? aspirin EC 81 MG tablet Take 81 mg by mouth daily. Swallow whole.    ? Budeson-Glycopyrrol-Formoterol (BREZTRI AEROSPHERE) 160-9-4.8 MCG/ACT AERO Inhale 2 puffs into the lungs 2 (two) times daily. 10.7 g 5  ? busPIRone (BUSPAR) 10 MG tablet Take 1 tablet (10 mg total) by mouth 3 (three) times daily. 270 tablet 3  ? CALCIUM-VITAMIN D PO Take by mouth.    ? carvedilol (COREG) 6.25 MG tablet Take 1 tablet (6.25 mg total) by mouth 2 (two) times daily with a meal. 180 tablet 3  ? citalopram (CELEXA) 40 MG tablet Take 1 tablet (40 mg total) by mouth  daily. 90 tablet 3  ? fexofenadine (ALLEGRA) 180 MG tablet Take 180 mg by mouth daily.    ? hydrochlorothiazide (HYDRODIURIL) 25 MG tablet Take 1 tablet (25 mg total) by mouth daily. 90 tablet 3  ? losartan (COZAAR) 100 MG tablet Take 1 tablet (100 mg total) by mouth daily. 90 tablet 3  ? nitroGLYCERIN (NITROSTAT) 0.4 MG SL tablet Place 1 tablet (0.4 mg total) under the tongue every 5 (five) minutes as needed. 25 tablet 3  ? pantoprazole (PROTONIX) 20 MG tablet Take 1 tablet (20 mg total) by mouth daily. For acid reflux 90 tablet 3  ? pregabalin (LYRICA) 150 MG capsule Take '150mg'$  each morning and '300mg'$  each evening. Pharmacy: Cancel previous rx 270 capsule 1  ? rosuvastatin (CRESTOR) 5 MG tablet Take 1 tablet (5 mg total) by mouth at bedtime. 90 tablet 3  ? ?No current facility-administered medications for this visit.  ? ? ? ?Past Surgical History:  ?Procedure Laterality Date  ? ANTERIOR CERVICAL DECOMP/DISCECTOMY FUSION N/A 10/05/2017  ? Procedure: C5-6 ANTERIOR CERVICAL DECOMPRESSION/DISCECTOMY FUSION,  ALLOGRAFT, PLATE  (NECK FIRST AND CARPAL TUNNEL RELEASE SECOND);  Surgeon: Marybelle Killings, MD;  Location: [place];  Service: Orthopedics;  Laterality: N/A;  ?  APPENDECTOMY  1993  ? BREAST BIOPSY Right   ? CARPAL TUNNEL RELEASE Right 10/05/2017  ? Procedure: RIGHT CARPAL TUNNEL RELEASE;  Surgeon: Marybelle Killings, MD;  Location: [place];  Service: Orthopedics;  Laterality: Right;  ? CATARACT EXTRACTION  08-2010  ? left   ? CATARACT EXTRACTION W/PHACO  06/01/2011  ? Procedure: CATARACT EXTRACTION PHACO AND INTRAOCULAR LENS PLACEMENT (IOC);  Surgeon: Tonny Tyna Huertas;  Location: [facility];  Service: Ophthalmology;  Laterality: Right;  CDE:9.62  ? CHOLECYSTECTOMY    ? COLONOSCOPY WITH PROPOFOL N/A 07/06/2020  ? Procedure: COLONOSCOPY WITH PROPOFOL;  Surgeon: Eloise Harman, DO;  Location: [facility]COPY;  Service: Endoscopy;  Laterality: N/A;  11:00am  ? RENAL ARTERY ANGIOPLASTY  2006  ? right  ? TUBAL LIGATION  1984  ? [facility]   ? ? ? ?Allergies  ?Allergen Reactions  ? Lipitor [Atorvastatin] Other (See Comments)  ?  Muscle aches and nausea  ? Naproxen Nausea And Vomiting  ?  Stomach pain  ? ? ? ? ?Family History  ?Problem Relation Age of Onset  ? Coronary artery disease Father   ?     States congenital abnormality  ? Diabetes Father   ? Hyperlipidemia Father   ? CAD Sister   ?     Stents   ? Cancer Sister   ?     breast  ? Cancer Mother   ?     breast  ? Cancer Maternal Aunt   ?     breast  ? Cancer Maternal Grandmother   ?     breast  ? Anesthesia problems Neg Hx   ? Hypotension Neg Hx   ? Malignant hyperthermia Neg Hx   ? Pseudochol deficiency Neg Hx   ? ? ? ?Social History ?Ms. Ahlers reports that she has been smoking cigarettes. She has a 40.00 pack-year smoking history. She has never used smokeless tobacco. ?Ms. Mcafee reports no history of alcohol use. ? ? ?Review of Systems ?CONSTITUTIONAL: No weight loss, fever, chills, weakness or fatigue.  ?HEENT: Eyes: No visual loss, blurred vision, double vision or yellow sclerae.No hearing loss, sneezing, congestion, runny nose or sore throat.  ?SKIN: No rash or itching.  ?CARDIOVASCULAR:  ?RESPIRATORY: No shortness of breath, cough or sputum.  ?GASTROINTESTINAL: No anorexia, nausea, vomiting or diarrhea. No abdominal pain or blood.  ?GENITOURINARY: No burning on urination, no polyuria ?NEUROLOGICAL: No headache, dizziness, syncope, paralysis, ataxia, numbness or tingling in the extremities. No change in bowel or bladder control.  ?MUSCULOSKELETAL: No muscle, back pain, joint pain or stiffness.  ?LYMPHATICS: No enlarged nodes. No history of splenectomy.  ?PSYCHIATRIC: No history of depression or anxiety.  ?ENDOCRINOLOGIC: No reports of sweating, cold or heat intolerance. No polyuria or polydipsia.  ?. ? ? ?Physical Examination ?There were no vitals filed for this visit. ?There were no vitals filed for this visit. ? ?Gen: resting comfortably, no acute distress ?HEENT: no scleral icterus, pupils  equal round and reactive, no palptable cervical adenopathy,  ?CV ?Resp: Clear to auscultation bilaterally ?GI: abdomen is soft, non-tender, non-distended, normal bowel sounds, no hepatosplenomegaly ?MSK: extremities are warm, no edema.  ?Skin: warm, no rash ?Neuro:  no focal deficits ?Psych: appropriate affect ? ? ?Diagnostic Studies ? ? ? ? ?Assessment and Plan  ? ? ? ? ? ? ?Arnoldo Lenis, M.D., F.A.C.C. ?

## 2021-08-19 ENCOUNTER — Other Ambulatory Visit: Payer: Self-pay

## 2021-08-19 ENCOUNTER — Telehealth: Payer: Self-pay | Admitting: Pulmonary Disease

## 2021-08-19 MED ORDER — BREZTRI AEROSPHERE 160-9-4.8 MCG/ACT IN AERO: 2.0000 | INHALATION_SPRAY | Freq: Two times a day (BID) | RESPIRATORY_TRACT | 0 refills | Status: DC

## 2021-08-19 MED ORDER — BREZTRI AEROSPHERE 160-9-4.8 MCG/ACT IN AERO: 2.0000 | INHALATION_SPRAY | Freq: Two times a day (BID) | RESPIRATORY_TRACT | 11 refills | Status: DC

## 2021-08-19 NOTE — Telephone Encounter (Signed)
Called and spoke to patient she says that [facility] told her that her breztri refill was denied by insurance.  ? ?Offered breztri samples for patient to pick up in meantime of figuring out what is going on with insurance/ refill ? ?Called and spoke to pharmacy tech at [facility] and she states that the patient just needs a refill. Refill sent for patient and patient notified.  ? ?Nothing further needed.  ?

## 2021-09-09 DIAGNOSIS — G4733 Obstructive sleep apnea (adult) (pediatric): Secondary | ICD-10-CM | POA: Diagnosis not present

## 2021-09-12 DIAGNOSIS — G4733 Obstructive sleep apnea (adult) (pediatric): Secondary | ICD-10-CM | POA: Diagnosis not present

## 2021-09-15 DIAGNOSIS — G4733 Obstructive sleep apnea (adult) (pediatric): Secondary | ICD-10-CM | POA: Diagnosis not present

## 2021-09-16 ENCOUNTER — Ambulatory Visit: Payer: Medicare Other | Admitting: Cardiology

## 2021-10-05 ENCOUNTER — Ambulatory Visit: Payer: Medicare Other | Admitting: Pulmonary Disease

## 2021-10-05 ENCOUNTER — Encounter: Payer: Self-pay | Admitting: Pulmonary Disease

## 2021-10-05 DIAGNOSIS — F17210 Nicotine dependence, cigarettes, uncomplicated: Secondary | ICD-10-CM | POA: Diagnosis not present

## 2021-10-05 DIAGNOSIS — G4733 Obstructive sleep apnea (adult) (pediatric): Secondary | ICD-10-CM

## 2021-10-05 DIAGNOSIS — J431 Panlobular emphysema: Secondary | ICD-10-CM

## 2021-10-05 NOTE — Assessment & Plan Note (Signed)
Smoking cessation was again emphasized is the most important intervention that would add years to her life.  We discussed Wellbutrin briefly she has failed Nicotrol inhaler.  She will still try nicotine patch again ?

## 2021-10-05 NOTE — Assessment & Plan Note (Signed)
CPAP download was reviewed which shows good control of events on auto settings 5 to 15 cm with average pressure of 14 cm, good usage more than 6 hours per night and minimal leak ?She has settled down with a fullface mask ?Weight loss encouraged, compliance with goal of at least 4-6 hrs every night is the expectation. ?Advised against medications with sedative side effects ?Cautioned against driving when sleepy - understanding that sleepiness will vary on a day to day basis ? ?

## 2021-10-05 NOTE — Patient Instructions (Addendum)
?  Stay on CPAP at least 4 h every night  ? ?You have to QUIT smoking! ?Continue Breztri ?

## 2021-10-05 NOTE — Assessment & Plan Note (Signed)
Continue Breztri 2 puffs twice daily. ?Use albuterol for rescue ?We discussed COPD action plan and signs and symptoms of flare ?

## 2021-10-05 NOTE — Progress Notes (Signed)
? ?  Subjective:  ? ? Patient ID: Jamie Mcgee, female    DOB: [birth year], 61 y.o.   MRN: [id number] ? ?HPI ? ?61-yo smoker for follow-up of gold II COPD and OSA ?She smoked more than 60 pack years she quit multiple times, longest for 7 months using nicotine patch and vaping.  Chantix gave her nightmares ?Tried nicotrol inhaler ? ?62-monthfollow-up visit ? ?ED visit for chest pain 06/2021 ?CT A chest 06/2021 mild emphysema, early cirrhosis ?She continues to smoke about a pack per day, tried Nicotrol inhaler without any relief.  She continues to complain about dyspnea, has been able to obtain Breztri ?She has been unable to access pulmonary rehab they quoted her a price of $40 per week which she is unable to afford ? ?She is unable to use her dentures since her gums are receding.  She was unable to use nasal mask due to chinstrap and she is back to full facemask.  No problems with pressure we reviewed CPAP download ? ?Significant tests/ events reviewed ? ?LDCT chest 11/2020 paraseptal emphysema, right upper lobe 4 mm nodule, nodular liver ?  ?02/2021 HST mod  OSA with AHI 20/ hr ?  ?03/2021 PFTs show moderate restriction, ratio 75, FEV1 1.37/54%, FVC 56%, DLCO 11.5/56% ?  ?12/2017 PFTs ratio 73, FEV1 1.41/54%, FVC 57%, no bronchodilator response, TLC 103%, DLCO 80%. ? ?09/2017 spirometry moderate restriction, ratio 76, FEV1 54%, FVC 55% ?  ?07/2017 spirometry moderate airway obstruction, ratio 65, FEV1 38%, FVC  46% ?  ?N PSG 01/2018-206 pounds-AHI 7/hour lowest desaturation 88% ?  ? ?Review of Systems ?neg for any significant sore throat, dysphagia, itching, sneezing, nasal congestion or excess/ purulent secretions, fever, chills, sweats, unintended wt loss, pleuritic or exertional cp, hempoptysis, orthopnea pnd or change in chronic leg swelling. Also denies presyncope, palpitations, heartburn, abdominal pain, nausea, vomiting, diarrhea or change in bowel or urinary habits, dysuria,hematuria, rash, arthralgias, visual  complaints, headache, numbness weakness or ataxia. ? ?   ?Objective:  ? Physical Exam ? ?Gen. Pleasant, obese, in no distress ?ENT - no lesions, no post nasal drip ?Neck: No JVD, no thyromegaly, no carotid bruits ?Lungs: no use of accessory muscles, no dullness to percussion, decreased without rales or rhonchi  ?Cardiovascular: Rhythm regular, heart sounds  normal, no murmurs or gallops, no peripheral edema ?Musculoskeletal: No deformities, no cyanosis or clubbing , no tremors ? ? ? ?   ?Assessment & Plan:  ? ? ?

## 2021-10-07 ENCOUNTER — Ambulatory Visit: Payer: Medicare Other | Admitting: Cardiology

## 2021-10-09 DIAGNOSIS — G4733 Obstructive sleep apnea (adult) (pediatric): Secondary | ICD-10-CM | POA: Diagnosis not present

## 2021-10-11 ENCOUNTER — Encounter: Payer: Self-pay | Admitting: Family Medicine

## 2021-10-11 ENCOUNTER — Ambulatory Visit (INDEPENDENT_AMBULATORY_CARE_PROVIDER_SITE_OTHER): Payer: Medicare Other | Admitting: Family Medicine

## 2021-10-11 VITALS — BP 123/73 | HR 74 | Temp 97.2°F | Ht 64.0 in | Wt 218.4 lb

## 2021-10-11 DIAGNOSIS — M5442 Lumbago with sciatica, left side: Secondary | ICD-10-CM | POA: Diagnosis not present

## 2021-10-11 DIAGNOSIS — G8929 Other chronic pain: Secondary | ICD-10-CM

## 2021-10-11 DIAGNOSIS — M5441 Lumbago with sciatica, right side: Secondary | ICD-10-CM | POA: Diagnosis not present

## 2021-10-11 DIAGNOSIS — Z79899 Other long term (current) drug therapy: Secondary | ICD-10-CM | POA: Diagnosis not present

## 2021-10-11 DIAGNOSIS — F5081 Binge eating disorder: Secondary | ICD-10-CM

## 2021-10-11 DIAGNOSIS — R41840 Attention and concentration deficit: Secondary | ICD-10-CM | POA: Diagnosis not present

## 2021-10-11 DIAGNOSIS — R739 Hyperglycemia, unspecified: Secondary | ICD-10-CM | POA: Diagnosis not present

## 2021-10-11 DIAGNOSIS — J431 Panlobular emphysema: Secondary | ICD-10-CM | POA: Diagnosis not present

## 2021-10-11 DIAGNOSIS — F4323 Adjustment disorder with mixed anxiety and depressed mood: Secondary | ICD-10-CM

## 2021-10-11 DIAGNOSIS — F50819 Binge eating disorder, unspecified: Secondary | ICD-10-CM

## 2021-10-11 DIAGNOSIS — R6889 Other general symptoms and signs: Secondary | ICD-10-CM | POA: Diagnosis not present

## 2021-10-11 LAB — BAYER DCA HB A1C WAIVED: HB A1C (BAYER DCA - WAIVED): 5.6 % (ref 4.8–5.6)

## 2021-10-11 MED ORDER — BUSPIRONE HCL 15 MG PO TABS: 15.0000 mg | ORAL_TABLET | Freq: Two times a day (BID) | ORAL | 1 refills | Status: DC

## 2021-10-11 MED ORDER — LISDEXAMFETAMINE DIMESYLATE 30 MG PO CAPS: 30.0000 mg | ORAL_CAPSULE | Freq: Every day | ORAL | 0 refills | Status: DC

## 2021-10-11 MED ORDER — PREGABALIN 150 MG PO CAPS: ORAL_CAPSULE | ORAL | 1 refills | Status: DC

## 2021-10-11 NOTE — Patient Instructions (Signed)
Controlled Substance Guidelines:  1. You cannot get an early refill, even it is lost.  2. You cannot get controlled medications from any other doctor, unless it is the emergency department and related to a new problem or injury.  3. You cannot use alcohol, marijuana, cocaine or any other recreational drugs while using this medication. This is very dangerous.  4. You are willing to have your urine drug tested at each visit.  5. You will not drive while using this medication, because that can put yourself and others in serious danger of an accident. 6. If any medication is stolen, then there must be a police report to verify it, or it cannot be refilled.  7. I will not prescribe these medications for longer than 3 months.  8. You must bring your pill bottle to each visit.  9. You must use the same pharmacy for all refills for the medication, unless you clear it with me beforehand.  10. You cannot share or sell this medication.   

## 2021-10-11 NOTE — Progress Notes (Signed)
Subjective: CC: Follow-up chronic back pain PCP: Raliegh Ip, DO Jamie Mcgee is a 61 y.o. female presenting to clinic today for:  1.  Chronic back pain She is compliant with Lyrica 150 every morning and 300 nightly.  She reports stability of symptoms.  2.  Binge eating disorder Patient with binge eating disorder.  She reports often eating past the point of fullness.  She knows that she should not be overeating but often will go through an entire box of crackers.  She often feels ill and uncomfortable afterwards but will repeat the same behavior the following day.  She does not report any bulimia.  3.  Concentration deficit Patient reports difficulty with concentration.  She reports uncontrolled anxiety and would like to advance the BuSpar, which she is only taking once per day at night because it causes sleepiness.  She is compliant with Celexa 40 mg daily.  Has never been diagnosed with ADHD.  Symptoms have been more prominent over the last couple of months but have been present for years.   ROS: Per HPI  Allergies  Allergen Reactions   Lipitor [Atorvastatin] Other (See Comments)    Muscle aches and nausea   Naproxen Nausea And Vomiting    Stomach pain   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   Depression    Fibromuscular dysplasia of renal artery (HCC) 2006   GERD (gastroesophageal reflux disease)    Headache    migraines 20 years ago   Hepatitis    type A in 1977   HNP (herniated nucleus pulposus), cervical    HPV (human papilloma virus) infection    HTN (hypertension)    Neuropathy    Vitamin D deficiency     Current Outpatient Medications:    albuterol (VENTOLIN HFA) 108 (90 Base) MCG/ACT inhaler, Inhale 2 puffs into the lungs every 6 (six) hours as needed for wheezing or shortness of  breath., Disp: 8 g, Rfl: 3   Budeson-Glycopyrrol-Formoterol (BREZTRI AEROSPHERE) 160-9-4.8 MCG/ACT AERO, Inhale 2 puffs into the lungs 2 (two) times daily., Disp: 10.7 g, Rfl: 11   busPIRone (BUSPAR) 10 MG tablet, Take 1 tablet (10 mg total) by mouth 3 (three) times daily. (Patient taking differently: Take 10 mg by mouth once.), Disp: 270 tablet, Rfl: 3   CALCIUM-VITAMIN D PO, Take by mouth., Disp: , Rfl:    carvedilol (COREG) 6.25 MG tablet, Take 1 tablet (6.25 mg total) by mouth 2 (two) times daily with a meal., Disp: 180 tablet, Rfl: 3   citalopram (CELEXA) 40 MG tablet, Take 1 tablet (40 mg total) by mouth daily., Disp: 90 tablet, Rfl: 3   fexofenadine (ALLEGRA) 180 MG tablet, Take 180 mg by mouth daily., Disp: , Rfl:    hydrochlorothiazide (HYDRODIURIL) 25 MG tablet, Take 1 tablet (25 mg total) by mouth daily., Disp: 90 tablet, Rfl: 3   losartan (COZAAR) 100 MG tablet, Take 1 tablet (100 mg total) by mouth daily., Disp: 90 tablet, Rfl: 3   nitroGLYCERIN (NITROSTAT) 0.4 MG SL tablet, Place 1 tablet (0.4 mg total) under the tongue every 5 (five) minutes as needed., Disp: 25 tablet, Rfl: 3   pantoprazole (PROTONIX) 20 MG tablet, Take 1 tablet (20 mg total) by mouth daily. For acid reflux, Disp: 90  tablet, Rfl: 3   pregabalin (LYRICA) 150 MG capsule, Take 150mg  each morning and 300mg  each evening. Pharmacy: Cancel previous rx, Disp: 270 capsule, Rfl: 1   rosuvastatin (CRESTOR) 5 MG tablet, Take 1 tablet (5 mg total) by mouth at bedtime., Disp: 90 tablet, Rfl: 3 Social History   Socioeconomic History   Marital status: Divorced    Spouse name: Not on file   Number of children: Not on file   Years of education: Not on file   Highest education level: Not on file  Occupational History   Not on file  Tobacco Use   Smoking status: Every Day    Packs/day: 1.00    Years: 40.00    Pack years: 40.00    Types: Cigarettes   Smokeless tobacco: Never   Tobacco comments:    3/4 of one ppd 05-12-21   Vaping Use   Vaping Use: Never used  Substance and Sexual Activity   Alcohol use: No    Alcohol/week: 0.0 standard drinks   Drug use: Yes    Frequency: 0.5 times per week    Types: Marijuana    Comment: weekly   Sexual activity: Not Currently    Birth control/protection: Post-menopausal  Other Topics Concern   Not on file  Social History Narrative   The patient does not have any sex drive and has not had one for almost a year.   Her   husband thinks she does not love him anymore.  He does not seem to understand   her   depression.   Social Determinants of Health   Financial Resource Strain: Medium Risk   Difficulty of Paying Living Expenses: Somewhat hard  Food Insecurity: No Food Insecurity   Worried About Programme researcher, broadcasting/film/video in the Last Year: Never true   Ran Out of Food in the Last Year: Never true  Transportation Needs: No Transportation Needs   Lack of Transportation (Medical): No   Lack of Transportation (Non-Medical): No  Physical Activity: Inactive   Days of Exercise per Week: 0 days   Minutes of Exercise per Session: 0 min  Stress: No Stress Concern Present   Feeling of Stress : Only a little  Social Connections: Socially Isolated   Frequency of Communication with Friends and Family: More than three times a week   Frequency of Social Gatherings with Friends and Family: Twice a week   Attends Religious Services: Never   Diplomatic Services operational officer: No   Attends Engineer, structural: Never   Marital Status: Divorced  Catering manager Violence: Not At Risk   Fear of Current or Ex-Partner: No   Emotionally Abused: No   Physically Abused: No   Sexually Abused: No   Family History  Problem Relation Age of Onset   Coronary artery disease Father        States congenital abnormality   Diabetes Father    Hyperlipidemia Father    CAD Sister        Stents    Cancer Sister        breast   Cancer Mother        breast   Cancer Maternal Aunt         breast   Cancer Maternal Grandmother        breast   Anesthesia problems Neg Hx    Hypotension Neg Hx    Malignant hyperthermia Neg Hx    Pseudochol deficiency Neg Hx     Objective:  Office vital signs reviewed. BP 123/73   Pulse 74   Temp (!) 97.2 F (36.2 C)   Ht 5\' 4"  (1.626 m)   Wt 218 lb 6.4 oz (99.1 kg)   LMP 05/25/2011   SpO2 95%   BMI 37.49 kg/m   Physical Examination:  General: Awake, alert, morbidly obese, No acute distress HEENT: Sclera white.  Moist mucous membranes Cardio: regular rate and rhythm, S1S2 heard, no murmurs appreciated Pulm: clear to auscultation bilaterally, no wheezes, rhonchi or rales; normal work of breathing on room air MSK: Ambulating independently.  Assessment/ Plan: 61 y.o. female   Chronic bilateral low back pain with bilateral sciatica - Plan: pregabalin (LYRICA) 150 MG capsule, ToxASSURE Select 13 (MW), Urine  Controlled substance agreement signed - Plan: ToxASSURE Select 13 (MW), Urine  Morbid obesity (HCC) - Plan: lisdexamfetamine (VYVANSE) 30 MG capsule, CMP14+EGFR, Bayer DCA Hb A1c Waived, TSH  Attention and concentration deficit - Plan: lisdexamfetamine (VYVANSE) 30 MG capsule, TSH, ToxASSURE Select 13 (MW), Urine  Binge eating disorder - Plan: lisdexamfetamine (VYVANSE) 30 MG capsule, ToxASSURE Select 13 (MW), Urine  Situational mixed anxiety and depressive disorder - Plan: busPIRone (BUSPAR) 15 MG tablet, TSH  Panlobular emphysema (HCC) - Plan: CBC with Differential  Lyrica renewed.  Symptoms are chronic and stable.  UDS and CSC were updated today.  I will try placing her on Vyvanse for binge eating disorder and hopefully this will help with her attention and concentration deficit.  We discussed that she would need a formal referral for a diagnosis of adult onset ADHD if in fact this is something that she is affected by.  We discussed risk versus benefit of the use of medication.  We discussed potential impact on  cardiovascular system, mood and sleep.  We discussed alternatives including Wellbutrin for appetite, concentration and mood  Buspirone has been advanced to 15 mg nightly but may use twice daily  We will obtain labs  National narcotic database reviewed and there were no red flags.  Medications have been renewed  She will follow-up in 1 month  No orders of the defined types were placed in this encounter.  No orders of the defined types were placed in this encounter.    Raliegh Ip, DO [facility] [phone]

## 2021-10-12 ENCOUNTER — Other Ambulatory Visit: Payer: Self-pay | Admitting: Family Medicine

## 2021-10-12 DIAGNOSIS — D72829 Elevated white blood cell count, unspecified: Secondary | ICD-10-CM

## 2021-10-12 LAB — CBC WITH DIFFERENTIAL/PLATELET
Basophils Absolute: 0.1 10*3/uL (ref 0.0–0.2)
Basos: 0 %
EOS (ABSOLUTE): 0.2 10*3/uL (ref 0.0–0.4)
Eos: 1 %
Hematocrit: 41.7 % (ref 34.0–46.6)
Hemoglobin: 14.1 g/dL (ref 11.1–15.9)
Immature Grans (Abs): 0.1 10*3/uL (ref 0.0–0.1)
Immature Granulocytes: 1 %
Lymphocytes Absolute: 3.2 10*3/uL — ABNORMAL HIGH (ref 0.7–3.1)
Lymphs: 25 %
MCH: 29.7 pg (ref 26.6–33.0)
MCHC: 33.8 g/dL (ref 31.5–35.7)
MCV: 88 fL (ref 79–97)
Monocytes Absolute: 0.9 10*3/uL (ref 0.1–0.9)
Monocytes: 7 %
Neutrophils Absolute: 8.6 10*3/uL — ABNORMAL HIGH (ref 1.4–7.0)
Neutrophils: 66 %
Platelets: 213 10*3/uL (ref 150–450)
RBC: 4.75 x10E6/uL (ref 3.77–5.28)
RDW: 13.1 % (ref 11.7–15.4)
WBC: 12.9 10*3/uL — ABNORMAL HIGH (ref 3.4–10.8)

## 2021-10-12 LAB — CMP14+EGFR
ALT: 25 IU/L (ref 0–32)
AST: 32 IU/L (ref 0–40)
Albumin/Globulin Ratio: 1.3 (ref 1.2–2.2)
Albumin: 3.9 g/dL (ref 3.8–4.8)
Alkaline Phosphatase: 103 IU/L (ref 44–121)
BUN/Creatinine Ratio: 5 — ABNORMAL LOW (ref 12–28)
BUN: 4 mg/dL — ABNORMAL LOW (ref 8–27)
Bilirubin Total: 0.5 mg/dL (ref 0.0–1.2)
CO2: 28 mmol/L (ref 20–29)
Calcium: 9 mg/dL (ref 8.7–10.3)
Chloride: 91 mmol/L — ABNORMAL LOW (ref 96–106)
Creatinine, Ser: 0.8 mg/dL (ref 0.57–1.00)
Globulin, Total: 2.9 g/dL (ref 1.5–4.5)
Glucose: 96 mg/dL (ref 70–99)
Potassium: 3.5 mmol/L (ref 3.5–5.2)
Sodium: 134 mmol/L (ref 134–144)
Total Protein: 6.8 g/dL (ref 6.0–8.5)
eGFR: 84 mL/min/{1.73_m2} (ref >59)

## 2021-10-12 LAB — TSH: TSH: 1.94 u[IU]/mL (ref 0.450–4.500)

## 2021-10-18 LAB — TOXASSURE SELECT 13 (MW), URINE

## 2021-11-08 ENCOUNTER — Ambulatory Visit: Payer: Medicare Other | Admitting: Cardiology

## 2021-11-08 ENCOUNTER — Encounter: Payer: Self-pay | Admitting: Cardiology

## 2021-11-08 VITALS — BP 114/78 | HR 72 | Ht 64.0 in | Wt 211.6 lb

## 2021-11-08 DIAGNOSIS — R0602 Shortness of breath: Secondary | ICD-10-CM

## 2021-11-08 DIAGNOSIS — E782 Mixed hyperlipidemia: Secondary | ICD-10-CM | POA: Diagnosis not present

## 2021-11-08 DIAGNOSIS — I10 Essential (primary) hypertension: Secondary | ICD-10-CM

## 2021-11-08 NOTE — Progress Notes (Signed)
Clinical Summary [name] is a 61 y.o.female patient last seen by Dr Bronson Ing in 2019. Seen today as a new patient for the following medical problems    1.Chest pain/DOE - 03/2015 echo: LVEF 60-65%, no WMAs - 03/2015 nuclear stress: no ischemia - 06/2017 nuclear stress: no ischemia  ER visit Jan 2023 with chest pain/SOB  CT PE negative, trops neg x2, EKG SR nonspecific ST/T changes  No recurrent chest pains since ER visit Walks 3/4 of mile daily, breathing improving over time.    2.COPD - followed by Dr Elsworth Soho.  - using inhalers  3. Hyperlipidemia - 04/2021 TC 124 TG 70 HDL 58 LDL 52   4. HTN - compliant with meds  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   Depression    Fibromuscular dysplasia of renal artery ([place]) 2006   GERD (gastroesophageal reflux disease)    Headache    migraines 20 years ago   Hepatitis    type A in 1977   HNP (herniated nucleus pulposus), cervical    HPV (human papilloma virus) infection    HTN (hypertension)    Neuropathy    Vitamin D deficiency      Allergies  Allergen Reactions   Lipitor [Atorvastatin] Other (See Comments)    Muscle aches and nausea   Naproxen Nausea And Vomiting    Stomach pain     Current Outpatient Medications  Medication Sig Dispense Refill   albuterol (VENTOLIN HFA) 108 (90 Base) MCG/ACT inhaler Inhale 2 puffs into the lungs every 6 (six) hours as needed for wheezing or shortness of breath. 8 g 3   Budeson-Glycopyrrol-Formoterol (BREZTRI AEROSPHERE) 160-9-4.8 MCG/ACT AERO Inhale 2 puffs into the lungs 2 (two) times daily. 10.7 g 11   busPIRone (BUSPAR) 15 MG tablet Take 1 tablet (15 mg total) by mouth 2 (two) times daily. For anxiety 180 tablet 1   CALCIUM-VITAMIN D PO Take by mouth.     carvedilol (COREG) 6.25 MG tablet Take 1 tablet  (6.25 mg total) by mouth 2 (two) times daily with a meal. 180 tablet 3   citalopram (CELEXA) 40 MG tablet Take 1 tablet (40 mg total) by mouth daily. 90 tablet 3   fexofenadine (ALLEGRA) 180 MG tablet Take 180 mg by mouth daily.     hydrochlorothiazide (HYDRODIURIL) 25 MG tablet Take 1 tablet (25 mg total) by mouth daily. 90 tablet 3   lisdexamfetamine (VYVANSE) 30 MG capsule Take 1 capsule (30 mg total) by mouth daily. 30 capsule 0   losartan (COZAAR) 100 MG tablet Take 1 tablet (100 mg total) by mouth daily. 90 tablet 3   nitroGLYCERIN (NITROSTAT) 0.4 MG SL tablet Place 1 tablet (0.4 mg total) under the tongue every 5 (five) minutes as needed. 25 tablet 3   pantoprazole (PROTONIX) 20 MG tablet Take 1 tablet (20 mg total) by mouth daily. For acid reflux 90 tablet 3   pregabalin (LYRICA) 150 MG capsule Take '150mg'$  each morning and '300mg'$  each evening. Pharmacy: Cancel previous rx 270 capsule 1   rosuvastatin (CRESTOR) 5 MG tablet Take 1 tablet (5 mg total) by mouth at bedtime. 90 tablet 3   No current facility-administered medications for this visit.     Past Surgical History:  Procedure Laterality Date  ANTERIOR CERVICAL DECOMP/DISCECTOMY FUSION N/A 10/05/2017   Procedure: C5-6 ANTERIOR CERVICAL DECOMPRESSION/DISCECTOMY FUSION,  ALLOGRAFT, PLATE  (NECK FIRST AND CARPAL TUNNEL RELEASE SECOND);  Surgeon: Marybelle Killings, MD;  Location: [place];  Service: Orthopedics;  Laterality: N/A;   APPENDECTOMY  1993   BREAST BIOPSY Right    CARPAL TUNNEL RELEASE Right 10/05/2017   Procedure: RIGHT CARPAL TUNNEL RELEASE;  Surgeon: Marybelle Killings, MD;  Location: [place];  Service: Orthopedics;  Laterality: Right;   CATARACT EXTRACTION  08-2010   left    CATARACT EXTRACTION W/PHACO  06/01/2011   Procedure: CATARACT EXTRACTION PHACO AND INTRAOCULAR LENS PLACEMENT (IOC);  Surgeon: Tonny ;  Location: [facility];  Service: Ophthalmology;  Laterality: Right;  CDE:9.62   CHOLECYSTECTOMY     COLONOSCOPY WITH PROPOFOL N/A  07/06/2020   Procedure: COLONOSCOPY WITH PROPOFOL;  Surgeon: Eloise Harman, DO;  Location: [facility];  Service: Endoscopy;  Laterality: N/A;  11:00am   RENAL ARTERY ANGIOPLASTY  2006   right   TUBAL LIGATION  1984   [facility]     Allergies  Allergen Reactions   Lipitor [Atorvastatin] Other (See Comments)    Muscle aches and nausea   Naproxen Nausea And Vomiting    Stomach pain      Family History  Problem Relation Age of Onset   Coronary artery disease Father        States congenital abnormality   Diabetes Father    Hyperlipidemia Father    CAD Sister        Stents    Cancer Sister        breast   Cancer Mother        breast   Cancer Maternal Aunt        breast   Cancer Maternal Grandmother        breast   Anesthesia problems Neg Hx    Hypotension Neg Hx    Malignant hyperthermia Neg Hx    Pseudochol deficiency Neg Hx      Social History [name] reports that she has been smoking cigarettes. She has a 40.00 pack-year smoking history. She has never used smokeless tobacco. [name] reports no history of alcohol use.   Review of Systems CONSTITUTIONAL: No weight loss, fever, chills, weakness or fatigue.  HEENT: Eyes: No visual loss, blurred vision, double vision or yellow sclerae.No hearing loss, sneezing, congestion, runny nose or sore throat.  SKIN: No rash or itching.  CARDIOVASCULAR: per hpi RESPIRATORY: No shortness of breath, cough or sputum.  GASTROINTESTINAL: No anorexia, nausea, vomiting or diarrhea. No abdominal pain or blood.  GENITOURINARY: No burning on urination, no polyuria NEUROLOGICAL: No headache, dizziness, syncope, paralysis, ataxia, numbness or tingling in the extremities. No change in bowel or bladder control.  MUSCULOSKELETAL: No muscle, back pain, joint pain or stiffness.  LYMPHATICS: No enlarged nodes. No history of splenectomy.  PSYCHIATRIC: No history of depression or anxiety.  ENDOCRINOLOGIC: No reports of sweating, cold or heat  intolerance. No polyuria or polydipsia.  Marland Kitchen   Physical Examination Today's Vitals   11/08/21 1443  BP: 114/78  Pulse: 72  SpO2: 97%  Weight: 211 lb 9.6 oz (96 kg)  Height: '5\' 4"'$  (1.626 m)   Body mass index is 36.32 kg/m.  Gen: resting comfortably, no acute distress HEENT: no scleral icterus, pupils equal round and reactive, no palptable cervical adenopathy,  CV: RRR, no m/r/g no jvd Resp: Clear to auscultation bilaterally GI: abdomen is soft, non-tender, non-distended, normal bowel  sounds, no hepatosplenomegaly MSK: extremities are warm, no edema.  Skin: warm, no rash Neuro:  no focal deficits Psych: appropriate affect      Assessment and Plan  1.SOB/Chest pain - long history of symptoms, cardiac testing over the years has been benign. - isolated episode in January without recurrence with negative ER workup at that time for ACS or PE, no further workup indicated at this time.   2.HTN - at goal, continue current meds  3. Hyperlipidemia - at goal, continue current meds  F/u as needed   Arnoldo Lenis, M.D.

## 2021-11-08 NOTE — Patient Instructions (Signed)
Medication Instructions:  Your physician recommends that you continue on your current medications as directed. Please refer to the Current Medication list given to you today.   Labwork: none  Testing/Procedures: none  Follow-Up:  Your physician recommends that you schedule a follow-up appointment in: As Needed  Any Other Special Instructions Will Be Listed Below (If Applicable).  If you need a refill on your cardiac medications before your next appointment, please call your pharmacy.  

## 2021-11-09 DIAGNOSIS — G4733 Obstructive sleep apnea (adult) (pediatric): Secondary | ICD-10-CM | POA: Diagnosis not present

## 2021-11-11 ENCOUNTER — Inpatient Hospital Stay (HOSPITAL_COMMUNITY): Payer: Medicare Other | Attending: Hematology | Admitting: Hematology

## 2021-11-11 ENCOUNTER — Ambulatory Visit (INDEPENDENT_AMBULATORY_CARE_PROVIDER_SITE_OTHER): Payer: Medicare Other | Admitting: Family Medicine

## 2021-11-11 ENCOUNTER — Inpatient Hospital Stay (HOSPITAL_COMMUNITY): Payer: Medicare Other

## 2021-11-11 ENCOUNTER — Encounter (HOSPITAL_COMMUNITY): Payer: Self-pay | Admitting: Hematology

## 2021-11-11 ENCOUNTER — Encounter: Payer: Self-pay | Admitting: Family Medicine

## 2021-11-11 VITALS — BP 137/95 | HR 69 | Temp 97.5°F | Resp 18 | Ht 64.0 in | Wt 205.7 lb

## 2021-11-11 DIAGNOSIS — Z87891 Personal history of nicotine dependence: Secondary | ICD-10-CM | POA: Diagnosis not present

## 2021-11-11 DIAGNOSIS — F4323 Adjustment disorder with mixed anxiety and depressed mood: Secondary | ICD-10-CM | POA: Diagnosis not present

## 2021-11-11 DIAGNOSIS — D72820 Lymphocytosis (symptomatic): Secondary | ICD-10-CM | POA: Insufficient documentation

## 2021-11-11 DIAGNOSIS — R700 Elevated erythrocyte sedimentation rate: Secondary | ICD-10-CM | POA: Insufficient documentation

## 2021-11-11 DIAGNOSIS — Z122 Encounter for screening for malignant neoplasm of respiratory organs: Secondary | ICD-10-CM

## 2021-11-11 DIAGNOSIS — F50819 Binge eating disorder, unspecified: Secondary | ICD-10-CM

## 2021-11-11 DIAGNOSIS — F419 Anxiety disorder, unspecified: Secondary | ICD-10-CM | POA: Insufficient documentation

## 2021-11-11 DIAGNOSIS — D72829 Elevated white blood cell count, unspecified: Secondary | ICD-10-CM | POA: Insufficient documentation

## 2021-11-11 DIAGNOSIS — F32A Depression, unspecified: Secondary | ICD-10-CM | POA: Insufficient documentation

## 2021-11-11 DIAGNOSIS — Z803 Family history of malignant neoplasm of breast: Secondary | ICD-10-CM | POA: Diagnosis not present

## 2021-11-11 DIAGNOSIS — Z79899 Other long term (current) drug therapy: Secondary | ICD-10-CM | POA: Diagnosis not present

## 2021-11-11 DIAGNOSIS — R41840 Attention and concentration deficit: Secondary | ICD-10-CM

## 2021-11-11 DIAGNOSIS — F5081 Binge eating disorder: Secondary | ICD-10-CM

## 2021-11-11 DIAGNOSIS — F17210 Nicotine dependence, cigarettes, uncomplicated: Secondary | ICD-10-CM | POA: Diagnosis not present

## 2021-11-11 LAB — C-REACTIVE PROTEIN: CRP: 0.9 mg/dL (ref <1.0)

## 2021-11-11 LAB — SEDIMENTATION RATE: Sed Rate: 32 mm/hr — ABNORMAL HIGH (ref 0–22)

## 2021-11-11 MED ORDER — LISDEXAMFETAMINE DIMESYLATE 30 MG PO CAPS: 30.0000 mg | ORAL_CAPSULE | Freq: Every day | ORAL | 0 refills | Status: DC

## 2021-11-11 NOTE — Progress Notes (Signed)
[facility] [address] [place] [address]   CLINIC:  Medical Oncology/Hematology  Patient Care Team: [name], DO as PCP - General (Family Medicine) Harl Bowie, Alphonse Guild, MD as PCP - Cardiology (Cardiology) Eloise Harman, DO as Consulting Physician (Internal Medicine) Derek Jack, MD as Medical Oncologist (Hematology)  CHIEF COMPLAINTS/PURPOSE OF CONSULTATION:  Evaluation for leukocytosis  HISTORY OF PRESENTING ILLNESS:  Jamie Mcgee 61 y.o. female is here because of evaluation for leukocytosis, at the request of [facility].  Today she reports feeling well. She started using a BREZTRI inhaler in March, and she uses it BID. She denies infections in the past year, and she denies history of recurrent infections. She denies fevers, nights sweats, and weight loss. She denies history of lupus and RA.    She is currently on disability, but previously she worked in Tax inspector. She denies chemical exposure. She current has smoked and has smoked 1.5 ppd for 40 years. Her mother and maternal grandmother had metastatic breast cancer.   MEDICAL HISTORY:  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   Depression    Fibromuscular dysplasia of renal artery ([place]) 2006   GERD (gastroesophageal reflux disease)    Headache    migraines 20 years ago   Hepatitis    type A in 1977   HNP (herniated nucleus pulposus), cervical    HPV (human papilloma virus) infection    HTN (hypertension)    Neuropathy    Vitamin D deficiency     SURGICAL HISTORY: Past Surgical History:  Procedure Laterality Date   ANTERIOR CERVICAL DECOMP/DISCECTOMY FUSION N/A 10/05/2017   Procedure: C5-6 ANTERIOR CERVICAL DECOMPRESSION/DISCECTOMY FUSION,  ALLOGRAFT, PLATE  (NECK FIRST AND CARPAL TUNNEL RELEASE SECOND);   Surgeon: Marybelle Killings, MD;  Location: [facility];  Service: Orthopedics;  Laterality: N/A;   APPENDECTOMY  1993   BREAST BIOPSY Right    CARPAL TUNNEL RELEASE Right 10/05/2017   Procedure: RIGHT CARPAL TUNNEL RELEASE;  Surgeon: Marybelle Killings, MD;  Location: [facility];  Service: Orthopedics;  Laterality: Right;   CATARACT EXTRACTION  08-2010   left    CATARACT EXTRACTION W/PHACO  06/01/2011   Procedure: CATARACT EXTRACTION PHACO AND INTRAOCULAR LENS PLACEMENT (IOC);  Surgeon: Tonny Branch;  Location: [facility];  Service: Ophthalmology;  Laterality: Right;  CDE:9.62   CHOLECYSTECTOMY     COLONOSCOPY WITH PROPOFOL N/A 07/06/2020   Procedure: COLONOSCOPY WITH PROPOFOL;  Surgeon: Eloise Harman, DO;  Location: [facility];  Service: Endoscopy;  Laterality: N/A;  11:00am   RENAL ARTERY ANGIOPLASTY  2006   right   TUBAL LIGATION  1984   [facility]    SOCIAL HISTORY: Social History   Socioeconomic History   Marital status: Divorced    Spouse name: Not on file   Number of children: Not on file   Years of education: Not on file   Highest education level: Not on file  Occupational History   Not on file  Tobacco Use   Smoking status: Every Day    Packs/day: 1.00    Years: 40.00    Total pack years: 40.00    Types: Cigarettes   Smokeless tobacco: Never   Tobacco comments:    3/4 of one ppd 05-12-21  Vaping  Use   Vaping Use: Never used  Substance and Sexual Activity   Alcohol use: No    Alcohol/week: 0.0 standard drinks of alcohol   Drug use: Yes    Frequency: 0.5 times per week    Types: Marijuana    Comment: weekly   Sexual activity: Not Currently    Birth control/protection: Post-menopausal  Other Topics Concern   Not on file  Social History Narrative   The patient does not have any sex drive and has not had one for almost a year.   Her   husband thinks she does not love him anymore.  He does not seem to understand   her   depression.   Social Determinants of Health   Financial Resource  Strain: Medium Risk (10/28/2020)   Overall Financial Resource Strain (CARDIA)    Difficulty of Paying Living Expenses: Somewhat hard  Food Insecurity: No Food Insecurity (10/28/2020)   Hunger Vital Sign    Worried About Running Out of Food in the Last Year: Never true    Ran Out of Food in the Last Year: Never true  Transportation Needs: No Transportation Needs (10/28/2020)   PRAPARE - Hydrologist (Medical): No    Lack of Transportation (Non-Medical): No  Physical Activity: Inactive (10/28/2020)   Exercise Vital Sign    Days of Exercise per Week: 0 days    Minutes of Exercise per Session: 0 min  Stress: No Stress Concern Present (10/28/2020)   [place]    Feeling of Stress : Only a little  Social Connections: Socially Isolated (10/28/2020)   Social Connection and Isolation Panel [NHANES]    Frequency of Communication with Friends and Family: More than three times a week    Frequency of Social Gatherings with Friends and Family: Twice a week    Attends Religious Services: Never    Marine scientist or Organizations: No    Attends Archivist Meetings: Never    Marital Status: Divorced  Human resources officer Violence: Not At Risk (10/28/2020)   Humiliation, Afraid, Rape, and Kick questionnaire    Fear of Current or Ex-Partner: No    Emotionally Abused: No    Physically Abused: No    Sexually Abused: No    FAMILY HISTORY: Family History  Problem Relation Age of Onset   Coronary artery disease Father        States congenital abnormality   Diabetes Father    Hyperlipidemia Father    CAD Sister        Stents    Cancer Sister        breast   Cancer Mother        breast   Cancer Maternal Aunt        breast   Cancer Maternal Grandmother        breast   Anesthesia problems Neg Hx    Hypotension Neg Hx    Malignant hyperthermia Neg Hx    Pseudochol deficiency Neg Hx      ALLERGIES:  is allergic to lipitor [atorvastatin] and naproxen.  MEDICATIONS:  Current Outpatient Medications  Medication Sig Dispense Refill   albuterol (VENTOLIN HFA) 108 (90 Base) MCG/ACT inhaler Inhale 2 puffs into the lungs every 6 (six) hours as needed for wheezing or shortness of breath. 8 g 3   Budeson-Glycopyrrol-Formoterol (BREZTRI AEROSPHERE) 160-9-4.8 MCG/ACT AERO Inhale 2 puffs into the lungs 2 (two) times daily. 10.7 g  11   busPIRone (BUSPAR) 15 MG tablet Take 1 tablet (15 mg total) by mouth 2 (two) times daily. For anxiety 180 tablet 1   CALCIUM-VITAMIN D PO Take 1 tablet by mouth daily.     carvedilol (COREG) 6.25 MG tablet Take 1 tablet (6.25 mg total) by mouth 2 (two) times daily with a meal. 180 tablet 3   citalopram (CELEXA) 40 MG tablet Take 1 tablet (40 mg total) by mouth daily. 90 tablet 3   fexofenadine (ALLEGRA) 180 MG tablet Take 180 mg by mouth daily as needed.     hydrochlorothiazide (HYDRODIURIL) 25 MG tablet Take 1 tablet (25 mg total) by mouth daily. 90 tablet 3   lisdexamfetamine (VYVANSE) 30 MG capsule Take 1 capsule (30 mg total) by mouth daily. 30 capsule 0   losartan (COZAAR) 100 MG tablet Take 1 tablet (100 mg total) by mouth daily. 90 tablet 3   pantoprazole (PROTONIX) 20 MG tablet Take 1 tablet (20 mg total) by mouth daily. For acid reflux 90 tablet 3   pregabalin (LYRICA) 150 MG capsule Take 163m each morning and 3051meach evening. Pharmacy: Cancel previous rx 270 capsule 1   rosuvastatin (CRESTOR) 5 MG tablet Take 1 tablet (5 mg total) by mouth at bedtime. 90 tablet 3   nitroGLYCERIN (NITROSTAT) 0.4 MG SL tablet Place 1 tablet (0.4 mg total) under the tongue every 5 (five) minutes as needed. (Patient not taking: Reported on 11/11/2021) 25 tablet 3   No current facility-administered medications for this visit.    REVIEW OF SYSTEMS:   Review of Systems  Constitutional:  Negative for appetite change, fatigue, fever and unexpected weight change.   HENT:   Positive for trouble swallowing.   Gastrointestinal:  Positive for diarrhea.  Endocrine: Negative for hot flashes.  Neurological:  Positive for numbness.  Psychiatric/Behavioral:  Positive for depression. The patient is nervous/anxious.   All other systems reviewed and are negative.    PHYSICAL EXAMINATION: ECOG PERFORMANCE STATUS: 1 - Symptomatic but completely ambulatory  Vitals:   11/11/21 1056  BP: (!) 137/95  Pulse: 69  Resp: 18  Temp: (!) 97.5 F (36.4 C)  SpO2: 99%   Filed Weights   11/11/21 1056  Weight: 205 lb 11.2 oz (93.3 kg)   Physical Exam Vitals reviewed.  Constitutional:      Appearance: Normal appearance. She is obese.  Cardiovascular:     Rate and Rhythm: Normal rate and regular rhythm.     Pulses: Normal pulses.     Heart sounds: Normal heart sounds.  Pulmonary:     Effort: Pulmonary effort is normal.     Breath sounds: Normal breath sounds.  Abdominal:     Palpations: Abdomen is soft. There is no hepatomegaly, splenomegaly or mass.     Tenderness: There is no abdominal tenderness.  Lymphadenopathy:     Cervical: No cervical adenopathy.     Right cervical: No superficial, deep or posterior cervical adenopathy.    Left cervical: No superficial, deep or posterior cervical adenopathy.     Upper Body:     Right upper body: No supraclavicular or axillary adenopathy.     Left upper body: No supraclavicular or axillary adenopathy.     Lower Body: No right inguinal adenopathy. No left inguinal adenopathy.  Neurological:     General: No focal deficit present.     Mental Status: She is alert and oriented to person, place, and time.  Psychiatric:        Mood  and Affect: Mood normal.        Behavior: Behavior normal.      LABORATORY DATA:  I have reviewed the data as listed Recent Results (from the past 2160 hour(s))  Bayer DCA Hb A1c Waived     Status: None   Collection Time: 10/11/21  9:10 AM  Result Value Ref Range   HB A1C (BAYER DCA  - WAIVED) 5.6 4.8 - 5.6 %    Comment:          Prediabetes: 5.7 - 6.4          Diabetes: >6.4          Glycemic control for adults with diabetes: <7.0   ToxASSURE Select 13 (MW), Urine     Status: None   Collection Time: 10/11/21  9:10 AM  Result Value Ref Range   Summary Note     Comment: ==================================================================== ToxASSURE Select 13 (MW) ==================================================================== Test                             Result       Flag       Units  Drug Absent but Declared for Prescription Verification   Amphetamine                    Not Detected UNEXPECTED ng/mg creat ==================================================================== Test                      Result    Flag   Units      Ref Range   Creatinine              45               mg/dL      >=20 ==================================================================== Declared Medications:  The flagging and interpretation on this report are based on the  following declared medications.  Unexpected results may arise from  inaccuracies in the declared medications.   **Note: The testing scope of this panel includes these medications:   Amphetamine (Vyvanse)   **Note: The testing scope of this panel does not include the  following r eported medications:   Albuterol (Ventolin HFA)  Budesonide (Breztri Aerosphere)  Buspirone (Buspar)  Calcium  Carvedilol (Coreg)  Citalopram (Celexa)  Fexofenadine (Allegra)  Formoterol (Breztri Aerosphere)  Glycopyrrolate (Breztri Aerosphere)  Hydrochlorothiazide (Hydrodiuril)  Losartan (Cozaar)  Nitroglycerin (Nitrostat)  Pantoprazole (Protonix)  Rosuvastatin (Crestor)  Vitamin D ==================================================================== For clinical consultation, please call [phone]. ====================================================================   CBC with Differential     Status: Abnormal    Collection Time: 10/11/21  9:14 AM  Result Value Ref Range   WBC 12.9 (H) 3.4 - 10.8 x10E3/uL   RBC 4.75 3.77 - 5.28 x10E6/uL   Hemoglobin 14.1 11.1 - 15.9 g/dL   Hematocrit 41.7 34.0 - 46.6 %   MCV 88 79 - 97 fL   MCH 29.7 26.6 - 33.0 pg   MCHC 33.8 31.5 - 35.7 g/dL   RDW 13.1 11.7 - 15.4 %   Platelets 213 150 - 450 x10E3/uL   Neutrophils 66 Not Estab. %   Lymphs 25 Not Estab. %   Monocytes 7 Not Estab. %   Eos 1 Not Estab. %   Basos 0 Not Estab. %   Neutrophils Absolute 8.6 (H) 1.4 - 7.0 x10E3/uL   Lymphocytes Absolute 3.2 (H) 0.7 - 3.1 x10E3/uL   Monocytes Absolute 0.9 0.1 - 0.9 x10E3/uL  EOS (ABSOLUTE) 0.2 0.0 - 0.4 x10E3/uL   Basophils Absolute 0.1 0.0 - 0.2 x10E3/uL   Immature Granulocytes 1 Not Estab. %   Immature Grans (Abs) 0.1 0.0 - 0.1 x10E3/uL  CMP14+EGFR     Status: Abnormal   Collection Time: 10/11/21  9:14 AM  Result Value Ref Range   Glucose 96 70 - 99 mg/dL   BUN 4 (L) 8 - 27 mg/dL   Creatinine, Ser 0.80 0.57 - 1.00 mg/dL   eGFR 84 >59 mL/min/1.73   BUN/Creatinine Ratio 5 (L) 12 - 28   Sodium 134 134 - 144 mmol/L   Potassium 3.5 3.5 - 5.2 mmol/L   Chloride 91 (L) 96 - 106 mmol/L   CO2 28 20 - 29 mmol/L   Calcium 9.0 8.7 - 10.3 mg/dL   Total Protein 6.8 6.0 - 8.5 g/dL   Albumin 3.9 3.8 - 4.8 g/dL   Globulin, Total 2.9 1.5 - 4.5 g/dL   Albumin/Globulin Ratio 1.3 1.2 - 2.2   Bilirubin Total 0.5 0.0 - 1.2 mg/dL   Alkaline Phosphatase 103 44 - 121 IU/L   AST 32 0 - 40 IU/L   ALT 25 0 - 32 IU/L  TSH     Status: None   Collection Time: 10/11/21  9:14 AM  Result Value Ref Range   TSH 1.940 0.450 - 4.500 uIU/mL    RADIOGRAPHIC STUDIES: I have personally reviewed the radiological images as listed and agreed with the findings in the report. No results found.  ASSESSMENT:  Leukocytosis: - Patient seen for evaluation of intermittent leukocytosis since 2019. - She had elevated neutrophils and lymphocytes on multiple occasions. - Denies taking any systemic  steroids.  However she is on steroid inhalers Breztri and Dulera prior to that.  No topical steroids. - No recurrent infections.  No B symptoms.  No prior history of splenectomy.   Social/family history: - She is disabled.  She did merchandising jobs prior to that.  She is a current active smoker, 1 and 1/2 pack/day for 40 years and trying to quit. - Mother had metastatic breast cancer.  Maternal grandmother had metastatic breast cancer.  No family history of leukemias.   PLAN:  Leukocytosis: - We reviewed the differential diagnosis of neutrophilic and lymphocytic leukocytosis. - We will check for reactive status and connective tissue disorders. - We will also check for MPN by sending JAK2 [id number] and BCR/ABL by FISH. - We will check flow cytometry for lymphoproliferative disorders. - RTC 2 to 3 weeks for follow-up to discuss results.   All questions were answered. The patient knows to call the clinic with any problems, questions or concerns.  Derek Jack, MD 11/11/21 12:08 PM  [place] [phone]   I, Thana Ates, am acting as a scribe for Dr. Derek Jack.  I, Derek Jack MD, have reviewed the above documentation for accuracy and completeness, and I agree with the above.

## 2021-11-11 NOTE — Progress Notes (Signed)
Subjective: CC: Follow-up binging disorder, anxiety and depression PCP: [name], DO [id number] K Jamie Mcgee is a 61 y.o. female presenting to clinic today for:  1.  Binge eating disorder Patient was started on Vyvanse 30 mg daily 1 month ago.  She notes excellent response to this medication.  She has had excellent appetite suppression.  She does admit that sometimes she will have some breakthrough binge eating but this is rare.  Her energy has been such that she is able to actually exercise and she has been doing that more frequently now.  She denies any heart palpitations, chest pain, shortness of breath, insomnia, increased anxiety, dryness or constipation.  She is hydrating well.  She is avoiding carbs.  She feels like her anxiety and depression actually seems better now and she is not sure if it is the Vyvanse or if it is the increased dose of BuSpar but either way she is pleased with how things are going   ROS: Per HPI  Allergies  Allergen Reactions   Lipitor [Atorvastatin] Other (See Comments)    Muscle aches and nausea   Naproxen Nausea And Vomiting    Stomach pain   Past Medical History:  Diagnosis Date   Anxiety    Arthritis    Carpal tunnel syndrome on right    Cervical stenosis of spine    C5-6   COPD (chronic obstructive pulmonary disease) ([place])    Gold III   Depression    Fibromuscular dysplasia of renal artery ([place]) 2006   GERD (gastroesophageal reflux disease)    Headache    migraines 20 years ago   Hepatitis    type A in 1977   HNP (herniated nucleus pulposus), cervical    HPV (human papilloma virus) infection    HTN (hypertension)    Neuropathy    Vitamin D deficiency     Current Outpatient Medications:    albuterol (VENTOLIN HFA) 108 (90 Base) MCG/ACT inhaler, Inhale 2 puffs into the lungs every 6 (six) hours as needed for wheezing or shortness of breath., Disp: 8 g, Rfl: 3   Budeson-Glycopyrrol-Formoterol (BREZTRI AEROSPHERE) 160-9-4.8 MCG/ACT  AERO, Inhale 2 puffs into the lungs 2 (two) times daily., Disp: 10.7 g, Rfl: 11   busPIRone (BUSPAR) 15 MG tablet, Take 1 tablet (15 mg total) by mouth 2 (two) times daily. For anxiety, Disp: 180 tablet, Rfl: 1   CALCIUM-VITAMIN D PO, Take 1 tablet by mouth daily., Disp: , Rfl:    carvedilol (COREG) 6.25 MG tablet, Take 1 tablet (6.25 mg total) by mouth 2 (two) times daily with a meal., Disp: 180 tablet, Rfl: 3   citalopram (CELEXA) 40 MG tablet, Take 1 tablet (40 mg total) by mouth daily., Disp: 90 tablet, Rfl: 3   fexofenadine (ALLEGRA) 180 MG tablet, Take 180 mg by mouth daily as needed., Disp: , Rfl:    hydrochlorothiazide (HYDRODIURIL) 25 MG tablet, Take 1 tablet (25 mg total) by mouth daily., Disp: 90 tablet, Rfl: 3   lisdexamfetamine (VYVANSE) 30 MG capsule, Take 1 capsule (30 mg total) by mouth daily., Disp: 30 capsule, Rfl: 0   losartan (COZAAR) 100 MG tablet, Take 1 tablet (100 mg total) by mouth daily., Disp: 90 tablet, Rfl: 3   pantoprazole (PROTONIX) 20 MG tablet, Take 1 tablet (20 mg total) by mouth daily. For acid reflux, Disp: 90 tablet, Rfl: 3   pregabalin (LYRICA) 150 MG capsule, Take '150mg'$  each morning and '300mg'$  each evening. Pharmacy: Cancel previous rx, Disp: 270  capsule, Rfl: 1   rosuvastatin (CRESTOR) 5 MG tablet, Take 1 tablet (5 mg total) by mouth at bedtime., Disp: 90 tablet, Rfl: 3   nitroGLYCERIN (NITROSTAT) 0.4 MG SL tablet, Place 1 tablet (0.4 mg total) under the tongue every 5 (five) minutes as needed. (Patient not taking: Reported on 11/11/2021), Disp: 25 tablet, Rfl: 3 Social History   Socioeconomic History   Marital status: Divorced    Spouse name: Not on file   Number of children: Not on file   Years of education: Not on file   Highest education level: Not on file  Occupational History   Not on file  Tobacco Use   Smoking status: Every Day    Packs/day: 1.00    Years: 40.00    Total pack years: 40.00    Types: Cigarettes   Smokeless tobacco: Never    Tobacco comments:    3/4 of one ppd 05-12-21  Vaping Use   Vaping Use: Never used  Substance and Sexual Activity   Alcohol use: No    Alcohol/week: 0.0 standard drinks of alcohol   Drug use: Yes    Frequency: 0.5 times per week    Types: Marijuana    Comment: weekly   Sexual activity: Not Currently    Birth control/protection: Post-menopausal  Other Topics Concern   Not on file  Social History Narrative   The patient does not have any sex drive and has not had one for almost a year.   Her   husband thinks she does not love him anymore.  He does not seem to understand   her   depression.   Social Determinants of Health   Financial Resource Strain: Medium Risk (10/28/2020)   Overall Financial Resource Strain (CARDIA)    Difficulty of Paying Living Expenses: Somewhat hard  Food Insecurity: No Food Insecurity (10/28/2020)   Hunger Vital Sign    Worried About Running Out of Food in the Last Year: Never true    Ran Out of Food in the Last Year: Never true  Transportation Needs: No Transportation Needs (10/28/2020)   PRAPARE - Hydrologist (Medical): No    Lack of Transportation (Non-Medical): No  Physical Activity: Inactive (10/28/2020)   Exercise Vital Sign    Days of Exercise per Week: 0 days    Minutes of Exercise per Session: 0 min  Stress: No Stress Concern Present (10/28/2020)   [place]    Feeling of Stress : Only a little  Social Connections: Socially Isolated (10/28/2020)   Social Connection and Isolation Panel [NHANES]    Frequency of Communication with Friends and Family: More than three times a week    Frequency of Social Gatherings with Friends and Family: Twice a week    Attends Religious Services: Never    Marine scientist or Organizations: No    Attends Archivist Meetings: Never    Marital Status: Divorced  Human resources officer Violence: Not At Risk  (10/28/2020)   Humiliation, Afraid, Rape, and Kick questionnaire    Fear of Current or Ex-Partner: No    Emotionally Abused: No    Physically Abused: No    Sexually Abused: No   Family History  Problem Relation Age of Onset   Coronary artery disease Father        States congenital abnormality   Diabetes Father    Hyperlipidemia Father    CAD Sister  Stents    Cancer Sister        breast   Cancer Mother        breast   Cancer Maternal Aunt        breast   Cancer Maternal Grandmother        breast   Anesthesia problems Neg Hx    Hypotension Neg Hx    Malignant hyperthermia Neg Hx    Pseudochol deficiency Neg Hx     Objective: Office vital signs reviewed. BP 115/71   Pulse 77   Temp 98 F (36.7 C)   Ht '5\' 4"'$  (1.626 m)   Wt 205 lb 12.8 oz (93.4 kg)   LMP 05/25/2011   SpO2 (!) 77%   BMI 35.33 kg/m   Physical Examination:  General: Awake, alert, nontoxic-appearing female.  No acute distress HEENT: Sclera white.  Moist mucous membranes Cardio: regular rate and rhythm, S1S2 heard, no murmurs appreciated Pulm: clear to auscultation bilaterally, no wheezes, rhonchi or rales; normal work of breathing on room air Psych: Mood stable, speech normal, affect appropriate.     11/11/2021    1:08 PM 10/11/2021    8:35 AM 05/10/2021    2:07 PM  Depression screen PHQ 2/9  Decreased Interest 1 2 0  Down, Depressed, Hopeless 1 2 0  PHQ - 2 Score 2 4 0  Altered sleeping 0 2 0  Tired, decreased energy 0 2 0  Change in appetite 1 2 0  Feeling bad or failure about yourself  0 0 0  Trouble concentrating 1 3 0  Moving slowly or fidgety/restless 0 2 0  Suicidal thoughts 0 0 0  PHQ-9 Score 4 15 0  Difficult doing work/chores Somewhat difficult Very difficult Not difficult at all      11/11/2021    1:08 PM 10/11/2021    8:36 AM 05/10/2021    2:07 PM 04/13/2021    9:32 AM  GAD 7 : Generalized Anxiety Score  Nervous, Anxious, on Edge 1 3 0 0  Control/stop worrying 1 1 0 0  Worry  too much - different things 1 1 0 0  Trouble relaxing 1 2 0 0  Restless 1 2 0 0  Easily annoyed or irritable 2 2 0 0  Afraid - awful might happen 1 0 0 0  Total GAD 7 Score 8 11 0 0  Anxiety Difficulty Somewhat difficult Somewhat difficult Not difficult at all Not difficult at all    Assessment/ Plan: 61 y.o. female   Morbid obesity ([place]) - Plan: lisdexamfetamine (VYVANSE) 30 MG capsule, lisdexamfetamine (VYVANSE) 30 MG capsule, lisdexamfetamine (VYVANSE) 30 MG capsule  Attention and concentration deficit - Plan: lisdexamfetamine (VYVANSE) 30 MG capsule, lisdexamfetamine (VYVANSE) 30 MG capsule, lisdexamfetamine (VYVANSE) 30 MG capsule  Binge eating disorder - Plan: lisdexamfetamine (VYVANSE) 30 MG capsule, lisdexamfetamine (VYVANSE) 30 MG capsule, lisdexamfetamine (VYVANSE) 30 MG capsule  Situational mixed anxiety and depressive disorder  She had an excellent response to the Vyvanse.  She has dropped 13 pounds since our visit 1 month ago.  She has had no adverse side effects or concerns with regards to the medication.  Blood pressure remains well controlled.  We will follow-up again in 3 months.  She is up-to-date on UDS and CSC.  National narcotic database reviewed and there were no red flags  BuSpar helping.  Does not desire any further dose changes at this time.  We will follow-up again in 3 months  No orders of the defined types  were placed in this encounter.  No orders of the defined types were placed in this encounter.    [name], DO [place] [phone]

## 2021-11-11 NOTE — Patient Instructions (Addendum)
[place] at [facility] Discharge Instructions  You were seen and examined today by Dr. Delton Coombes. Dr. Delton Coombes is a hematologist, meaning that he specializes in blood abnormalities. Dr. Delton Coombes discussed your past medical history, family history of cancers/blood conditions and the events that led to you being here today.  You were referred to Dr. Delton Coombes due to elevated white blood cell count. This is known as Leukocytosis. Dr. Delton Coombes has recommended additional lab work today in an attempt to find the cause of the elevated white blood cell counts (WBCs).  WBCs can be elevated in the presence of steroids, but anything more serious will be ruled out with the lab work.   Follow-up as scheduled.   Thank you for choosing [place] at [facility] to provide your oncology and hematology care.  To afford each patient quality time with our provider, please arrive at least 15 minutes before your scheduled appointment time.   If you have a lab appointment with the [facility] please come in thru the Main Entrance and check in at the main information desk.  You need to re-schedule your appointment should you arrive 10 or more minutes late.  We strive to give you quality time with our providers, and arriving late affects you and other patients whose appointments are after yours.  Also, if you no show three or more times for appointments you may be dismissed from the clinic at the providers discretion.     Again, thank you for choosing [facility].  Our hope is that these requests will decrease the amount of time that you wait before being seen by our physicians.       _____________________________________________________________  Should you have questions after your visit to [facility], please contact our office at [phone] and follow the prompts.  Our office hours are 8:00 a.m. and 4:30 p.m. Monday - Friday.   Please note that voicemails left after 4:00 p.m. may not be returned until the following business day.  We are closed weekends and major holidays.  You do have access to a nurse 24-7, just call the main number to the clinic [phone] and do not press any options, hold on the line and a nurse will answer the phone.    For prescription refill requests, have your pharmacy contact our office and allow 72 hours.    Due to Covid, you will need to wear a mask upon entering the hospital. If you do not have a mask, a mask will be given to you at the Main Entrance upon arrival. For doctor visits, patients may have 1 support person age 49 or older with them. For treatment visits, patients can not have anyone with them due to social distancing guidelines and our immunocompromised population.

## 2021-11-12 LAB — RHEUMATOID FACTOR: Rheumatoid fact SerPl-aCnc: <10 IU/mL (ref <14.0)

## 2021-11-14 ENCOUNTER — Ambulatory Visit (HOSPITAL_COMMUNITY): Payer: Medicare Other

## 2021-11-14 LAB — SURGICAL PATHOLOGY

## 2021-11-16 LAB — BCR-ABL1 FISH
Cells Analyzed: 200
Cells Counted: 200

## 2021-11-16 LAB — ANTINUCLEAR ANTIBODIES, IFA: ANA Ab, IFA: NEGATIVE

## 2021-11-18 ENCOUNTER — Other Ambulatory Visit (HOSPITAL_COMMUNITY)
Admission: RE | Admit: 2021-11-18 | Discharge: 2021-11-18 | Disposition: A | Discharge status: Home / Self Care | Payer: Medicare Other | Source: Ambulatory Visit | Attending: Adult Health | Admitting: Adult Health

## 2021-11-18 ENCOUNTER — Ambulatory Visit (INDEPENDENT_AMBULATORY_CARE_PROVIDER_SITE_OTHER): Payer: Medicare Other | Admitting: Adult Health

## 2021-11-18 ENCOUNTER — Encounter: Payer: Self-pay | Admitting: Adult Health

## 2021-11-18 VITALS — BP 133/86 | HR 70 | Ht 63.25 in | Wt 203.5 lb

## 2021-11-18 DIAGNOSIS — Z1211 Encounter for screening for malignant neoplasm of colon: Secondary | ICD-10-CM | POA: Diagnosis not present

## 2021-11-18 DIAGNOSIS — Z1151 Encounter for screening for human papillomavirus (HPV): Secondary | ICD-10-CM | POA: Diagnosis not present

## 2021-11-18 DIAGNOSIS — K649 Unspecified hemorrhoids: Secondary | ICD-10-CM

## 2021-11-18 DIAGNOSIS — R87810 Cervical high risk human papillomavirus (HPV) DNA test positive: Secondary | ICD-10-CM | POA: Diagnosis present

## 2021-11-18 DIAGNOSIS — Z01419 Encounter for gynecological examination (general) (routine) without abnormal findings: Secondary | ICD-10-CM | POA: Insufficient documentation

## 2021-11-18 LAB — HEMOCCULT GUIAC POC 1CARD (OFFICE): Fecal Occult Blood, POC: NEGATIVE

## 2021-11-18 LAB — FLOW CYTOMETRY

## 2021-11-18 NOTE — Progress Notes (Signed)
Patient ID: Jamie Mcgee, female   DOB: June 15, 1960, 61 y.o.   MRN: [id number] History of Present Illness: Jamie Mcgee is a 61 year old white female, divorced, PM in for a well woman gyn exam and pap. Last pap was +HPV. She saw Dr Raliegh Ip this week WBC was elevated.  PCP is Dr Lajuana Ripple.   Current Medications, Allergies, Past Medical History, Past Surgical History, Family History and Social History were reviewed in [employer] record.     Review of Systems: Patient denies any headaches, hearing loss, fatigue, blurred vision, shortness of breath, chest pain, abdominal pain, problems with bowel movements, urination, or intercourse.(Not active). No joint pain or mood swings.  Denies any vaginal bleeding Hemorrhoid bleed at times    Physical Exam:BP 133/86 (BP Location: Left Arm, Patient Position: Sitting, Cuff Size: Normal)   Pulse 70   Ht 5' 3.25" (1.607 m)   Wt 203 lb 8 oz (92.3 kg)   LMP 05/25/2011   BMI 35.76 kg/m   General:  Well developed, well nourished, no acute distress Skin:  Warm and dry Neck:  Midline trachea, normal thyroid, good ROM, no lymphadenopathy,no carotid bruits heard  Lungs; Clear to auscultation bilaterally Breast:  No dominant palpable mass, retraction, or nipple discharge Cardiovascular: Regular rate and rhythm Abdomen:  Soft, non tender, no hepatosplenomegaly Pelvic:  External genitalia is normal in appearance, no lesions.  The vagina is pale with loss of rugae. Urethra has no lesions or masses. The cervix is smooth, pap with HR HPV genotyping performed.  Uterus is felt to be normal size, shape, and contour.  No adnexal masses or tenderness noted.Bladder is non tender, no masses felt. Rectal: Good sphincter tone, no polyps, + hemorrhoids felt.  Hemoccult negative. Extremities/musculoskeletal:  No swelling or varicosities noted, no clubbing or cyanosis Psych:  No mood changes, alert and cooperative,seems happy AA is 1    11/18/2021   12:45 PM  11/11/2021    1:08 PM 10/11/2021    8:35 AM  Depression screen PHQ 2/9  Decreased Interest '1 1 2  '$ Down, Depressed, Hopeless '1 1 2  '$ PHQ - 2 Score '2 2 4  '$ Altered sleeping 0 0 2  Tired, decreased energy 0 0 2  Change in appetite '2 1 2  '$ Feeling bad or failure about yourself  2 0 0  Trouble concentrating '1 1 3  '$ Moving slowly or fidgety/restless 0 0 2  Suicidal thoughts 0 0 0  PHQ-9 Score '7 4 15  '$ Difficult doing work/chores  Somewhat difficult Very difficult       11/18/2021   12:45 PM 11/11/2021    1:08 PM 10/11/2021    8:36 AM 05/10/2021    2:07 PM  GAD 7 : Generalized Anxiety Score  Nervous, Anxious, on Edge 0 1 3 0  Control/stop worrying 0 1 1 0  Worry too much - different things '1 1 1 '$ 0  Trouble relaxing 0 1 2 0  Restless '1 1 2 '$ 0  Easily annoyed or irritable 0 2 2 0  Afraid - awful might happen 0 1 0 0  Total GAD 7 Score '2 8 11 '$ 0  Anxiety Difficulty  Somewhat difficult Somewhat difficult Not difficult at all      Upstream - 11/18/21 1252       Pregnancy Intention Screening   Does the patient want to become pregnant in the next year? No    Does the patient's partner want to become pregnant in the next year? No  Would the patient like to discuss contraceptive options today? No      Contraception Wrap Up   Current Method Female Sterilization    End Method Female Sterilization    Contraception Counseling Provided No            Examination chaperoned by Levy Pupa LPN  Impression and Plan: 1. Encounter for gynecological examination with Papanicolaou smear of cervix Pap sent Physical in 1 year Pap in 3 if normal Labs with PCP Colonoscopy per GI Mammogram yearly  - Cytology - PAP( [place])  2. Papanicolaou smear of cervix with positive high risk human papilloma virus (HPV) test Pap sent  - Cytology - PAP( [place])  3. Encounter for screening fecal occult blood testing Hemoccult is negative  - POCT occult blood stool  4. Hemorrhoids, unspecified  hemorrhoid type

## 2021-11-20 LAB — CALR +MPL + E12-E15  (REFLEX)

## 2021-11-20 LAB — JAK2 V617F RFX CALR/MPL/E12-15

## 2021-11-23 ENCOUNTER — Telehealth: Payer: Self-pay | Admitting: Adult Health

## 2021-11-23 LAB — CYTOLOGY - PAP
Adequacy: ABSENT
Comment: NEGATIVE
Comment: NEGATIVE
Comment: NEGATIVE
Diagnosis: NEGATIVE
HPV 16: NEGATIVE
HPV 18 / 45: NEGATIVE
High risk HPV: POSITIVE — AB

## 2021-11-23 NOTE — Telephone Encounter (Signed)
Pt aware that pap +HPV, and needs colpo per ASCCP, will get colpo appt made

## 2021-11-28 ENCOUNTER — Ambulatory Visit (INDEPENDENT_AMBULATORY_CARE_PROVIDER_SITE_OTHER): Payer: Medicare Other | Admitting: Obstetrics & Gynecology

## 2021-11-28 ENCOUNTER — Encounter: Payer: Self-pay | Admitting: Obstetrics & Gynecology

## 2021-11-28 VITALS — BP 120/77 | HR 66 | Ht 63.25 in | Wt 203.0 lb

## 2021-11-28 DIAGNOSIS — R87810 Cervical high risk human papillomavirus (HPV) DNA test positive: Secondary | ICD-10-CM | POA: Diagnosis not present

## 2021-11-28 NOTE — Progress Notes (Signed)
    Colposcopy Procedure Note:    Colposcopy Procedure Note  Indications:  2023 normal cytology, +HR HPV, negative 16/18 2022 normal cytology, + HR HPV, no subtyping done    2019 ASCCP recommendation:  Smoker:  quit x 2 weeks New sexual partner:  No.  : time frame:    History of abnormal Pap: no  Procedure Details  The risks and benefits of the procedure and Written informed consent obtained.  Speculum placed in vagina and excellent visualization of cervix achieved, cervix swabbed x 3 with acetic acid solution.  Findings: Adequate colposcopy is noted today.  Cervix: no visible lesions, no mosaicism, no punctation, and no abnormal vasculature; SCJ visualized 360 degrees without lesions and no biopsies taken. Vaginal inspection: vaginal colposcopy not performed. Vulvar colposcopy: vulvar colposcopy not performed.  Specimens: none  Complications: none.  Colposcopic Impression: Normal colposcopy  Plan(Based on 2019 ASCCP recommendations) Follow up HPV based Pap testing 1 year

## 2021-11-29 NOTE — Progress Notes (Signed)
[place] [place], [place] [address]   CLINIC:  Medical Oncology/Hematology  PCP:  [name], [place] [address] [phone]   REASON FOR VISIT:  Follow-up for leukocytosis  PRIOR THERAPY: None  CURRENT THERAPY: Under work-up  INTERVAL HISTORY:  Jamie Mcgee 61 y.o. female returns for routine follow-up of leukocytosis.  She was seen for initial consultation by Dr. Delton Coombes on 11/11/2021.  At today's visit, she reports feeling fairly well.  She has not had any changes in her baseline health status since her initial visit with Dr. Delton Coombes 2 weeks ago.  As noted at her initial consultation, she has been using her Breztri inhaler since March 2023.   She denies any recent infections or recurrent infections.   She denies any fevers, night sweats, chills, or unintentional weight loss.  No history of lupus or rheumatoid arthritis.  She has not noticed any new lumps or bumps. She reports that she quit smoking 2 weeks ago on 11/15/2021. She has 100% energy and 100% appetite. She endorses that she is maintaining a stable weight.   REVIEW OF SYSTEMS:    Review of Systems  Constitutional:  Negative for appetite change, chills, diaphoresis, fatigue, fever and unexpected weight change.  HENT:   Negative for lump/mass and nosebleeds.   Eyes:  Negative for eye problems.  Respiratory:  Negative for cough, hemoptysis and shortness of breath (baseline COPD).   Cardiovascular:  Negative for chest pain, leg swelling and palpitations.  Gastrointestinal:  Negative for abdominal pain, blood in stool, constipation, diarrhea, nausea and vomiting.  Genitourinary:  Negative for hematuria.   Skin: Negative.   Neurological:  Negative for dizziness, headaches and light-headedness.  Hematological:  Does not bruise/bleed easily.      PAST MEDICAL/SURGICAL HISTORY:  Past Medical History:  Diagnosis Date   Anxiety    Arthritis    Binge eating  disorder    Carpal tunnel syndrome on right    Cervical stenosis of spine    C5-6   COPD (chronic obstructive pulmonary disease) (HCC)    Gold III   Depression    Fibromuscular dysplasia of renal artery ([place]) 2006   GERD (gastroesophageal reflux disease)    Headache    migraines 20 years ago   Hepatitis    type A in 1977   HNP (herniated nucleus pulposus), cervical    HPV (human papilloma virus) infection    HTN (hypertension)    Neuropathy    Vitamin D deficiency    Past Surgical History:  Procedure Laterality Date   ANTERIOR CERVICAL DECOMP/DISCECTOMY FUSION N/A 10/05/2017   Procedure: C5-6 ANTERIOR CERVICAL DECOMPRESSION/DISCECTOMY FUSION,  ALLOGRAFT, PLATE  (NECK FIRST AND CARPAL TUNNEL RELEASE SECOND);  Surgeon: Marybelle Killings, MD;  Location: [facility];  Service: Orthopedics;  Laterality: N/A;   APPENDECTOMY  1993   BREAST BIOPSY Right    CARPAL TUNNEL RELEASE Right 10/05/2017   Procedure: RIGHT CARPAL TUNNEL RELEASE;  Surgeon: Marybelle Killings, MD;  Location: [facility];  Service: Orthopedics;  Laterality: Right;   CATARACT EXTRACTION  08-2010   left    CATARACT EXTRACTION W/PHACO  06/01/2011   Procedure: CATARACT EXTRACTION PHACO AND INTRAOCULAR LENS PLACEMENT (IOC);  Surgeon: Tonny Branch;  Location: [facility];  Service: Ophthalmology;  Laterality: Right;  CDE:9.62   CHOLECYSTECTOMY     COLONOSCOPY WITH PROPOFOL N/A 07/06/2020   Procedure: COLONOSCOPY WITH PROPOFOL;  Surgeon: Eloise Harman, DO;  Location: [facility]  ENDOSCOPY;  Service: Endoscopy;  Laterality: N/A;  11:00am   RENAL ARTERY ANGIOPLASTY  2006   right   TUBAL LIGATION  1984   [facility]     SOCIAL HISTORY:  Social History   Socioeconomic History   Marital status: Divorced    Spouse name: Not on file   Number of children: Not on file   Years of education: Not on file   Highest education level: Not on file  Occupational History   Not on file  Tobacco Use   Smoking status: Former    Packs/day: 1.00    Years: 40.00    Total pack  years: 40.00    Types: Cigarettes   Smokeless tobacco: Never   Tobacco comments:    3/4 of one ppd 05-12-21  Vaping Use   Vaping Use: Never used  Substance and Sexual Activity   Alcohol use: No    Alcohol/week: 0.0 standard drinks of alcohol   Drug use: Yes    Frequency: 0.5 times per week    Types: Marijuana    Comment: daily   Sexual activity: Not Currently    Birth control/protection: Post-menopausal, Surgical    Comment: tubal  Other Topics Concern   Not on file  Social History Narrative   The patient does not have any sex drive and has not had one for almost a year.   Her   husband thinks she does not love him anymore.  He does not seem to understand   her   depression.   Social Determinants of Health   Financial Resource Strain: Medium Risk (11/18/2021)   Overall Financial Resource Strain (CARDIA)    Difficulty of Paying Living Expenses: Somewhat hard  Food Insecurity: Food Insecurity Present (11/18/2021)   Hunger Vital Sign    Worried About Running Out of Food in the Last Year: Sometimes true    Ran Out of Food in the Last Year: Often true  Transportation Needs: No Transportation Needs (11/18/2021)   PRAPARE - Hydrologist (Medical): No    Lack of Transportation (Non-Medical): No  Physical Activity: Insufficiently Active (11/18/2021)   Exercise Vital Sign    Days of Exercise per Week: 4 days    Minutes of Exercise per Session: 20 min  Stress: No Stress Concern Present (11/18/2021)   [place]    Feeling of Stress : Only a little  Social Connections: Socially Isolated (11/18/2021)   Social Connection and Isolation Panel [NHANES]    Frequency of Communication with Friends and Family: Three times a week    Frequency of Social Gatherings with Friends and Family: Once a week    Attends Religious Services: Never    Marine scientist or Organizations: No    Attends Theatre manager Meetings: Never    Marital Status: Divorced  Human resources officer Violence: Not At Risk (11/18/2021)   Humiliation, Afraid, Rape, and Kick questionnaire    Fear of Current or Ex-Partner: No    Emotionally Abused: No    Physically Abused: No    Sexually Abused: No    FAMILY HISTORY:  Family History  Problem Relation Age of Onset   Coronary artery disease Father        States congenital abnormality   Diabetes Father    Hyperlipidemia Father    CAD Sister        Stents    Cancer Sister  breast   Cancer Mother        breast   Cancer Maternal Aunt        breast   Cancer Maternal Grandmother        breast   Anesthesia problems Neg Hx    Hypotension Neg Hx    Malignant hyperthermia Neg Hx    Pseudochol deficiency Neg Hx     CURRENT MEDICATIONS:  Outpatient Encounter Medications as of 11/30/2021  Medication Sig   albuterol (VENTOLIN HFA) 108 (90 Base) MCG/ACT inhaler Inhale 2 puffs into the lungs every 6 (six) hours as needed for wheezing or shortness of breath.   Budeson-Glycopyrrol-Formoterol (BREZTRI AEROSPHERE) 160-9-4.8 MCG/ACT AERO Inhale 2 puffs into the lungs 2 (two) times daily.   busPIRone (BUSPAR) 15 MG tablet Take 1 tablet (15 mg total) by mouth 2 (two) times daily. For anxiety (Patient taking differently: Take 15 mg by mouth at bedtime. For anxiety)   CALCIUM-VITAMIN D PO Take 1 tablet by mouth daily.   carvedilol (COREG) 6.25 MG tablet Take 1 tablet (6.25 mg total) by mouth 2 (two) times daily with a meal.   citalopram (CELEXA) 40 MG tablet Take 1 tablet (40 mg total) by mouth daily.   fexofenadine (ALLEGRA) 180 MG tablet Take 180 mg by mouth daily as needed.   hydrochlorothiazide (HYDRODIURIL) 25 MG tablet Take 1 tablet (25 mg total) by mouth daily.   lisdexamfetamine (VYVANSE) 30 MG capsule Take 1 capsule (30 mg total) by mouth daily.   [START ON 12/11/2021] lisdexamfetamine (VYVANSE) 30 MG capsule Take 1 capsule (30 mg total) by mouth daily.   [START  ON 01/10/2022] lisdexamfetamine (VYVANSE) 30 MG capsule Take 1 capsule (30 mg total) by mouth daily.   losartan (COZAAR) 100 MG tablet Take 1 tablet (100 mg total) by mouth daily.   nitroGLYCERIN (NITROSTAT) 0.4 MG SL tablet Place 1 tablet (0.4 mg total) under the tongue every 5 (five) minutes as needed.   pantoprazole (PROTONIX) 20 MG tablet Take 1 tablet (20 mg total) by mouth daily. For acid reflux   pregabalin (LYRICA) 150 MG capsule Take 1109m each morning and 3029meach evening. Pharmacy: Cancel previous rx   rosuvastatin (CRESTOR) 5 MG tablet Take 1 tablet (5 mg total) by mouth at bedtime.   TURMERIC PO Take by mouth.   No facility-administered encounter medications on file as of 11/30/2021.    ALLERGIES:  Allergies  Allergen Reactions   Lipitor [Atorvastatin] Other (See Comments)    Muscle aches and nausea   Naproxen Nausea And Vomiting    Stomach pain     PHYSICAL EXAM:  ECOG PERFORMANCE STATUS: 0 - Asymptomatic    There were no vitals filed for this visit. There were no vitals filed for this visit. Physical Exam Constitutional:      Appearance: Normal appearance. She is obese.  HENT:     Head: Normocephalic and atraumatic.     Mouth/Throat:     Mouth: Mucous membranes are moist.  Eyes:     Extraocular Movements: Extraocular movements intact.     Pupils: Pupils are equal, round, and reactive to light.  Cardiovascular:     Rate and Rhythm: Normal rate and regular rhythm.     Pulses: Normal pulses.     Heart sounds: Normal heart sounds.  Pulmonary:     Effort: Pulmonary effort is normal.     Breath sounds: Decreased air movement present. Decreased breath sounds present.  Abdominal:     General: Bowel sounds are  normal.     Palpations: Abdomen is soft.     Tenderness: There is no abdominal tenderness.  Musculoskeletal:        General: No swelling.     Right lower leg: No edema.     Left lower leg: No edema.  Lymphadenopathy:     Cervical: No cervical adenopathy.   Skin:    General: Skin is warm and dry.  Neurological:     General: No focal deficit present.     Mental Status: She is alert and oriented to person, place, and time.  Psychiatric:        Mood and Affect: Mood normal.        Behavior: Behavior normal.      LABORATORY DATA:  I have reviewed the labs as listed.  CBC    Component Value Date/Time   WBC 12.9 (H) 10/11/2021 0914   WBC 13.0 (H) 06/20/2021 2004   RBC 4.75 10/11/2021 0914   RBC 5.00 06/20/2021 2004   HGB 14.1 10/11/2021 0914   HCT 41.7 10/11/2021 0914   PLT 213 10/11/2021 0914   MCV 88 10/11/2021 0914   MCH 29.7 10/11/2021 0914   MCH 30.2 06/20/2021 2004   MCHC 33.8 10/11/2021 0914   MCHC 34.0 06/20/2021 2004   RDW 13.1 10/11/2021 0914   LYMPHSABS 3.2 (H) 10/11/2021 0914   MONOABS 0.5 08/22/2015 2015   EOSABS 0.2 10/11/2021 0914   BASOSABS 0.1 10/11/2021 0914      Latest Ref Rng & Units 10/11/2021    9:14 AM 07/01/2021   11:31 AM 06/20/2021    8:04 PM  CMP  Glucose 70 - 99 mg/dL 96  79  85   BUN 8 - 27 mg/dL _0 Creatinine 0.57 - 1.00 mg/dL 0.80  0.86  0.87   Sodium 134 - 144 mmol/L 134  135  131   Potassium 3.5 - 5.2 mmol/L 3.5  3.6  3.4   Chloride 96 - 106 mmol/L 91  92  92   CO2 20 - 29 mmol/L 28  32  32   Calcium 8.7 - 10.3 mg/dL 9.0  9.3  8.8   Total Protein 6.0 - 8.5 g/dL 6.8  7.3    Total Bilirubin 0.0 - 1.2 mg/dL 0.5  0.5    Alkaline Phos 44 - 121 IU/L 103  114    AST 0 - 40 IU/L 32  26    ALT 0 - 32 IU/L 25  17      DIAGNOSTIC IMAGING:  I have independently reviewed the relevant imaging and discussed with the patient.  ASSESSMENT & PLAN: 1.  Leukocytosis - Patient seen for evaluation of intermittent leukocytosis since at least 2019 with Hgb ranging from normal up to 13.0, intermittently elevated neutrophils and lymphocytes on multiple occasions. - She is on inhaled steroid medications Judithann Sauger, previously on Bessie).  Denies taking any oral or topical steroids.  - No history of  splenectomy.   - She recently quit smoking after smoking 1.5 PPD cigarettes for 40 years - quit on 11/15/2021 - No recurrent infections.  No B symptoms.    - No lymphadenopathy on exam.   - Most recent CBC (10/11/2021): WBC 12.9 with ANC 3.6 and ALC 3.2. - Hematology work-up (11/11/2021):  Peripheral blood smear: Leukocytosis predominantly secondary to absolute neutrophilia, mild lymphocytosis, and borderline monocytosis Flow cytometry negative for lymphoproliferative disorder JAK2 with reflex to CALR, MPL, and Exons 15-15 was negative BCR/ABL FISH  negative Mildly elevated ESR 32, CRP normal at 0.9.  Negative rheumatoid factor and ANA. - Differential diagnosis favors reactive leukocytosis secondary to chronic tobacco use and inhaled steroids.  No concern for malignant leukocytosis at this time. - PLAN: Malignant leukocytosis has been ruled out.  She has reactive leukocytosis secondary to inhaled steroids and tobacco use.  We will discharge patient back to PCP for ongoing management of underlying conditions.  Patient can be referred back to [place] in the future if any new problems arise or if she has persistently elevated WBC >20,000.  2.  Tobacco use - Smoked 1.5 PPD x40 years, quit as of 11/15/2021 - LDCT chest (11/11/2020): Lung RADS 2, benign appearance or behavior - PLAN: Continue to encourage smoking cessation.  She has been recommended to attend [facility] classes. - Continue annual LDCT chest screening per [facility] program.    3.  Other history - She is disabled.  She did merchandising jobs prior to that.  She is a current active smoker, 1 and 1/2 pack/day for 40 years and trying to quit. - Mother had metastatic breast cancer.  Maternal grandmother had metastatic breast cancer.  No family history of leukemias   All questions were answered. The patient knows to call the clinic with any problems, questions or concerns.  Medical decision making: Low  Time spent on visit: I spent 15  minutes counseling the patient face to face. The total time spent in the appointment was 22 minutes and more than 50% was on counseling.   Harriett Rush, PA-C  11/22/2021 10:38 AM

## 2021-11-30 ENCOUNTER — Inpatient Hospital Stay (HOSPITAL_BASED_OUTPATIENT_CLINIC_OR_DEPARTMENT_OTHER): Payer: Medicare Other | Admitting: Physician Assistant

## 2021-11-30 VITALS — BP 123/93 | HR 62 | Temp 97.5°F | Resp 20 | Ht 64.76 in | Wt 206.8 lb

## 2021-11-30 DIAGNOSIS — D72829 Elevated white blood cell count, unspecified: Secondary | ICD-10-CM

## 2021-11-30 DIAGNOSIS — Z79899 Other long term (current) drug therapy: Secondary | ICD-10-CM | POA: Diagnosis not present

## 2021-11-30 DIAGNOSIS — Z87891 Personal history of nicotine dependence: Secondary | ICD-10-CM | POA: Diagnosis not present

## 2021-11-30 DIAGNOSIS — R700 Elevated erythrocyte sedimentation rate: Secondary | ICD-10-CM | POA: Diagnosis not present

## 2021-11-30 DIAGNOSIS — F32A Depression, unspecified: Secondary | ICD-10-CM | POA: Diagnosis not present

## 2021-11-30 DIAGNOSIS — Z803 Family history of malignant neoplasm of breast: Secondary | ICD-10-CM | POA: Diagnosis not present

## 2021-11-30 DIAGNOSIS — D72820 Lymphocytosis (symptomatic): Secondary | ICD-10-CM | POA: Diagnosis not present

## 2021-11-30 NOTE — Patient Instructions (Signed)
[place] at [facility] Discharge Instructions  You were seen today by Tarri Abernethy PA-C for your elevated white blood cells.  Your lab test did not show any sign of cancerous white blood cells.  We suspect that your elevated white blood cells is "reactive" from your smoking and your inhaled steroids for your COPD.  You do not need to continue following with the hematology/oncology clinic at this time, but can be referred back to [place] in the future if there are any new concerns, or if you have a persistently elevated white blood cell count higher than 20.  You should still continue your annual CT scanning of your chest for lung cancer screening due to your history of smoking.    Thank you for choosing [place] at [facility] to provide your oncology and hematology care.  To afford each patient quality time with our provider, please arrive at least 15 minutes before your scheduled appointment time.   If you have a lab appointment with the [place] please come in thru the Main Entrance and check in at the main information desk.  You need to re-schedule your appointment should you arrive 10 or more minutes late.  We strive to give you quality time with our providers, and arriving late affects you and other patients whose appointments are after yours.  Also, if you no show three or more times for appointments you may be dismissed from the clinic at the providers discretion.     Again, thank you for choosing [facility].  Our hope is that these requests will decrease the amount of time that you wait before being seen by our physicians.       _____________________________________________________________  Should you have questions after your visit to [facility], please contact our office at [phone] and follow the prompts.  Our office hours are 8:00 a.m. and 4:30 p.m. Monday - Friday.  Please note that voicemails  left after 4:00 p.m. may not be returned until the following business day.  We are closed weekends and major holidays.  You do have access to a nurse 24-7, just call the main number to the clinic [phone] and do not press any options, hold on the line and a nurse will answer the phone.    For prescription refill requests, have your pharmacy contact our office and allow 72 hours.    Due to Covid, you will need to wear a mask upon entering the hospital. If you do not have a mask, a mask will be given to you at the Main Entrance upon arrival. For doctor visits, patients may have 1 support person age 63 or older with them. For treatment visits, patients can not have anyone with them due to social distancing guidelines and our immunocompromised population.

## 2021-12-01 ENCOUNTER — Ambulatory Visit (HOSPITAL_COMMUNITY): Payer: Medicare Other | Admitting: Physician Assistant

## 2021-12-05 DIAGNOSIS — G4733 Obstructive sleep apnea (adult) (pediatric): Secondary | ICD-10-CM | POA: Diagnosis not present

## 2021-12-09 DIAGNOSIS — G4733 Obstructive sleep apnea (adult) (pediatric): Secondary | ICD-10-CM | POA: Diagnosis not present

## 2022-01-02 ENCOUNTER — Ambulatory Visit (HOSPITAL_COMMUNITY): Payer: Medicare Other

## 2022-01-09 DIAGNOSIS — G4733 Obstructive sleep apnea (adult) (pediatric): Secondary | ICD-10-CM | POA: Diagnosis not present

## 2022-01-18 ENCOUNTER — Other Ambulatory Visit (HOSPITAL_COMMUNITY): Payer: Self-pay | Admitting: Family Medicine

## 2022-01-18 DIAGNOSIS — Z1231 Encounter for screening mammogram for malignant neoplasm of breast: Secondary | ICD-10-CM

## 2022-01-27 ENCOUNTER — Ambulatory Visit (INDEPENDENT_AMBULATORY_CARE_PROVIDER_SITE_OTHER): Payer: Medicare Other | Admitting: Pulmonary Disease

## 2022-01-27 ENCOUNTER — Encounter: Payer: Self-pay | Admitting: Pulmonary Disease

## 2022-01-27 VITALS — BP 130/64 | HR 64 | Temp 97.6°F | Ht 64.0 in | Wt 199.4 lb

## 2022-01-27 DIAGNOSIS — J431 Panlobular emphysema: Secondary | ICD-10-CM | POA: Diagnosis not present

## 2022-01-27 DIAGNOSIS — G4733 Obstructive sleep apnea (adult) (pediatric): Secondary | ICD-10-CM

## 2022-01-27 NOTE — Assessment & Plan Note (Addendum)
She has done very well, compliant with Breztri. Breathing has improved, rarely needs albuterol for rescue. I encouraged her to enroll in the [employer] fitness program I congratulated her on smoking cessation

## 2022-01-27 NOTE — Assessment & Plan Note (Signed)
Unfortunately she is unable to tolerate full facemask. We will send her in a prescription for a nasal mask. Her sleep apnea was moderate but she has lost about 10 pounds since her home sleep test. I doubt she is a candidate for hypoglossal nerve implantation.  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  May have to just defer treatment if she is unable to tolerate nasal mask

## 2022-01-27 NOTE — Progress Notes (Signed)
   Subjective:    Patient ID: Jamie Mcgee, female    DOB: [birth year], 61 y.o.   MRN: [id number]  HPI  28-yo smoker for follow-up of gold II COPD and OSA She smoked more than 60 pack years she quit multiple times, longest for 7 months using nicotine patch and vaping.  Chantix gave her nightmares Tried nicotrol inhaler  Chief Complaint  Patient presents with   Follow-up    Having a hard time with CPAP machine. States she sweats at night with the mask on for CPAP.    She is participating in the home rehab program and is doing very well.  She has quit smoking since June 13.  She is trying to exercise daily at [employer].  She has lost 20 pounds to her current weight of 195 pounds.  She has been able to get Breztri from the company Unfortunately she is not able to use her CPAP machine.  She is not tolerating the full facemask very well.  She is only eligible for mask every 6 months.  Pressure seems okay   Significant tests/ events reviewed  LDCT chest 11/2020 paraseptal emphysema, right upper lobe 4 mm nodule, nodular liver   02/2021 HST mod  OSA with AHI 20/ hr   03/2021 PFTs show moderate restriction, ratio 75, FEV1 1.37/54%, FVC 56%, DLCO 11.5/56%   12/2017 PFTs ratio 73, FEV1 1.41/54%, FVC 57%, no bronchodilator response, TLC 103%, DLCO 80%.  09/2017 spirometry moderate restriction, ratio 76, FEV1 54%, FVC 55%   07/2017 spirometry moderate airway obstruction, ratio 65, FEV1 38%, FVC  46%   N PSG 01/2018-206 pounds-AHI 7/hour lowest desaturation 88%  Review of Systems  neg for any significant sore throat, dysphagia, itching, sneezing, nasal congestion or excess/ purulent secretions, fever, chills, sweats, unintended wt loss, pleuritic or exertional cp, hempoptysis, orthopnea pnd or change in chronic leg swelling. Also denies presyncope, palpitations, heartburn, abdominal pain, nausea, vomiting, diarrhea or change in bowel or urinary habits, dysuria,hematuria, rash, arthralgias,  visual complaints, headache, numbness weakness or ataxia.     Objective:   Physical Exam  Gen. Pleasant, obese, in no distress ENT - no lesions, no post nasal drip Neck: No JVD, no thyromegaly, no carotid bruits Lungs: no use of accessory muscles, no dullness to percussion, decreased without rales or rhonchi  Cardiovascular: Rhythm regular, heart sounds  normal, no murmurs or gallops, no peripheral edema Musculoskeletal: No deformities, no cyanosis or clubbing , no tremors       Assessment & Plan:

## 2022-01-27 NOTE — Patient Instructions (Signed)
  Congratulations on quitting smoking !!  X Rx for nasal mask

## 2022-01-30 ENCOUNTER — Telehealth: Payer: Self-pay | Admitting: *Deleted

## 2022-01-30 ENCOUNTER — Ambulatory Visit (HOSPITAL_COMMUNITY)
Admission: RE | Admit: 2022-01-30 | Discharge: 2022-01-30 | Disposition: A | Discharge status: Home / Self Care | Payer: Medicare Other | Source: Ambulatory Visit | Attending: Family Medicine | Admitting: Family Medicine

## 2022-01-30 DIAGNOSIS — Z1231 Encounter for screening mammogram for malignant neoplasm of breast: Secondary | ICD-10-CM | POA: Insufficient documentation

## 2022-01-30 NOTE — Telephone Encounter (Signed)
Returned call for PREP referral. Interested in starting next class at the [facility] in Nov/Dec.2023.

## 2022-01-31 ENCOUNTER — Other Ambulatory Visit: Payer: Self-pay | Admitting: Family Medicine

## 2022-01-31 DIAGNOSIS — F331 Major depressive disorder, recurrent, moderate: Secondary | ICD-10-CM

## 2022-01-31 DIAGNOSIS — G8929 Other chronic pain: Secondary | ICD-10-CM

## 2022-01-31 DIAGNOSIS — I10 Essential (primary) hypertension: Secondary | ICD-10-CM

## 2022-01-31 DIAGNOSIS — F4323 Adjustment disorder with mixed anxiety and depressed mood: Secondary | ICD-10-CM

## 2022-01-31 DIAGNOSIS — E7800 Pure hypercholesterolemia, unspecified: Secondary | ICD-10-CM

## 2022-02-07 ENCOUNTER — Telehealth (INDEPENDENT_AMBULATORY_CARE_PROVIDER_SITE_OTHER): Payer: Medicare Other | Admitting: Family Medicine

## 2022-02-07 ENCOUNTER — Telehealth: Payer: Medicare Other | Admitting: Family Medicine

## 2022-02-07 ENCOUNTER — Encounter: Payer: Self-pay | Admitting: Family Medicine

## 2022-02-07 DIAGNOSIS — Z6834 Body mass index (BMI) 34.0-34.9, adult: Secondary | ICD-10-CM

## 2022-02-07 DIAGNOSIS — I10 Essential (primary) hypertension: Secondary | ICD-10-CM | POA: Diagnosis not present

## 2022-02-07 DIAGNOSIS — F4323 Adjustment disorder with mixed anxiety and depressed mood: Secondary | ICD-10-CM

## 2022-02-07 DIAGNOSIS — Z87891 Personal history of nicotine dependence: Secondary | ICD-10-CM

## 2022-02-07 DIAGNOSIS — F5081 Binge eating disorder: Secondary | ICD-10-CM

## 2022-02-07 DIAGNOSIS — R41840 Attention and concentration deficit: Secondary | ICD-10-CM | POA: Diagnosis not present

## 2022-02-07 DIAGNOSIS — E7800 Pure hypercholesterolemia, unspecified: Secondary | ICD-10-CM | POA: Diagnosis not present

## 2022-02-07 DIAGNOSIS — F331 Major depressive disorder, recurrent, moderate: Secondary | ICD-10-CM

## 2022-02-07 MED ORDER — CARVEDILOL 6.25 MG PO TABS: 6.2500 mg | ORAL_TABLET | Freq: Two times a day (BID) | ORAL | 3 refills | Status: DC

## 2022-02-07 MED ORDER — PANTOPRAZOLE SODIUM 20 MG PO TBEC
DELAYED_RELEASE_TABLET | ORAL | 3 refills | Status: DC
Start: 2022-02-07 — End: 2022-10-17

## 2022-02-07 MED ORDER — ROSUVASTATIN CALCIUM 5 MG PO TABS: 5.0000 mg | ORAL_TABLET | Freq: Every day | ORAL | 3 refills | Status: DC

## 2022-02-07 MED ORDER — LISDEXAMFETAMINE DIMESYLATE 30 MG PO CAPS: 30.0000 mg | ORAL_CAPSULE | Freq: Every day | ORAL | 0 refills | Status: DC

## 2022-02-07 MED ORDER — HYDROCHLOROTHIAZIDE 25 MG PO TABS: 25.0000 mg | ORAL_TABLET | Freq: Every day | ORAL | 3 refills | Status: DC

## 2022-02-07 MED ORDER — CITALOPRAM HYDROBROMIDE 40 MG PO TABS: 40.0000 mg | ORAL_TABLET | Freq: Every day | ORAL | 3 refills | Status: DC

## 2022-02-07 MED ORDER — BUSPIRONE HCL 15 MG PO TABS: 15.0000 mg | ORAL_TABLET | Freq: Two times a day (BID) | ORAL | 1 refills | Status: DC

## 2022-02-07 MED ORDER — LOSARTAN POTASSIUM 100 MG PO TABS: 100.0000 mg | ORAL_TABLET | Freq: Every day | ORAL | 3 refills | Status: DC

## 2022-02-07 NOTE — Progress Notes (Signed)
MyChart Video visit  Subjective: Jamie Mcgee. weight PCP: [name], DO WEX:[id number] Jamie Mcgee is a 61 y.o. female. Patient provides verbal consent for consult held via video.  Due to COVID-19 pandemic this visit was conducted virtually. This visit type was conducted due to national recommendations for restrictions regarding the COVID-19 Pandemic (e.g. social distancing, sheltering in place) in an effort to limit this patient's exposure and mitigate transmission in our community. All issues noted in this document were discussed and addressed.  A physical exam was not performed with this format.   Location of patient: home Location of provider: [facility] Others present for call: none  1.  Morbid obesity associated with binge eating disorder, hypertension, hyperlipidemia She is going to the gym in [place] to work out with a  nurse that starts in November.  She remains off of cigarettes and breathing has been good.  She is exercising regularly.  She should be getting a wireless spirometer with Dr Elsworth Soho soon.  She is compliant with all medications.  She monitors blood pressure and they have been well controlled.  Oxygen saturation have been consistently above 97%.  Continues to do well on the Vyvanse.  She is having no concerning symptoms or signs including tremor, heart palpitations, excessive stimulation.  No chest pain or shortness of breath.  ROS: Per HPI  Allergies  Allergen Reactions   Lipitor [Atorvastatin] Other (See Comments)    Muscle aches and nausea   Naproxen Nausea And Vomiting    Stomach pain   Past Medical History:  Diagnosis Date   Anxiety    Arthritis    Binge eating disorder    Carpal tunnel syndrome on right    Cervical stenosis of spine    C5-6   COPD (chronic obstructive pulmonary disease) (HCC)    Gold III   Depression    Fibromuscular dysplasia of renal artery ([place]) 2006   GERD (gastroesophageal reflux disease)    Headache    migraines 20 years ago    Hepatitis    type A in 1977   HNP (herniated nucleus pulposus), cervical    HPV (human papilloma virus) infection    HTN (hypertension)    Neuropathy    Vitamin D deficiency     Current Outpatient Medications:    albuterol (VENTOLIN HFA) 108 (90 Base) MCG/ACT inhaler, Inhale 2 puffs into the lungs every 6 (six) hours as needed for wheezing or shortness of breath., Disp: 8 g, Rfl: 3   Budeson-Glycopyrrol-Formoterol (BREZTRI AEROSPHERE) 160-9-4.8 MCG/ACT AERO, Inhale 2 puffs into the lungs 2 (two) times daily., Disp: 10.7 g, Rfl: 11   busPIRone (BUSPAR) 15 MG tablet, Take 1 tablet (15 mg total) by mouth 2 (two) times daily. For anxiety (Patient taking differently: Take 15 mg by mouth at bedtime. For anxiety), Disp: 180 tablet, Rfl: 1   CALCIUM-VITAMIN D PO, Take 1 tablet by mouth daily., Disp: , Rfl:    carvedilol (COREG) 6.25 MG tablet, TAKE ONE TABLET BY MOUTH TWICE DAILY WITH MEALS, Disp: 180 tablet, Rfl: 0   citalopram (CELEXA) 40 MG tablet, TAKE ONE TABLET BY MOUTH DAILY, Disp: 90 tablet, Rfl: 0   fexofenadine (ALLEGRA) 180 MG tablet, Take 180 mg by mouth daily as needed., Disp: , Rfl:    hydrochlorothiazide (HYDRODIURIL) 25 MG tablet, TAKE ONE TABLET BY MOUTH DAILY, Disp: 90 tablet, Rfl: 0   lisdexamfetamine (VYVANSE) 30 MG capsule, Take 1 capsule (30 mg total) by mouth daily., Disp: 30 capsule, Rfl: 0   lisdexamfetamine (  VYVANSE) 30 MG capsule, Take 1 capsule (30 mg total) by mouth daily., Disp: 30 capsule, Rfl: 0   lisdexamfetamine (VYVANSE) 30 MG capsule, Take 1 capsule (30 mg total) by mouth daily., Disp: 30 capsule, Rfl: 0   losartan (COZAAR) 100 MG tablet, TAKE ONE TABLET BY MOUTH DAILY, Disp: 90 tablet, Rfl: 0   nitroGLYCERIN (NITROSTAT) 0.4 MG SL tablet, Place 1 tablet (0.4 mg total) under the tongue every 5 (five) minutes as needed., Disp: 25 tablet, Rfl: 3   pantoprazole (PROTONIX) 20 MG tablet, TAKE ONE TABLET BY MOUTH DAILY FOR ACID REFLUX, Disp: 90 tablet, Rfl: 0   pregabalin  (LYRICA) 150 MG capsule, Take '150mg'$  each morning and '300mg'$  each evening. Pharmacy: Cancel previous rx, Disp: 270 capsule, Rfl: 1   rosuvastatin (CRESTOR) 5 MG tablet, TAKE ONE TABLET BY MOUTH AT BEDTIME, Disp: 90 tablet, Rfl: 0   TURMERIC PO, Take by mouth., Disp: , Rfl:   Blood pressure 108/77, weight 195 lb (88.5 kg), last menstrual period 05/25/2011, SpO2 99 %.  Gen: Well-appearing female in no acute distress.  Obese. HEENT: Sclera white.  Moist mucous membranes.  Teeth absent Pulmonary: Normal work of breathing on room air. Psych: Great eye contact.  Mood stable.  Thought processes linear   Assessment/ Plan: 61 y.o. female   Morbid obesity ([place]) - Plan: lisdexamfetamine (VYVANSE) 30 MG capsule, lisdexamfetamine (VYVANSE) 30 MG capsule, lisdexamfetamine (VYVANSE) 30 MG capsule  Binge eating disorder - Plan: lisdexamfetamine (VYVANSE) 30 MG capsule, lisdexamfetamine (VYVANSE) 30 MG capsule, lisdexamfetamine (VYVANSE) 30 MG capsule  Attention and concentration deficit - Plan: lisdexamfetamine (VYVANSE) 30 MG capsule, lisdexamfetamine (VYVANSE) 30 MG capsule, lisdexamfetamine (VYVANSE) 30 MG capsule  Essential hypertension, benign - Plan: hydrochlorothiazide (HYDRODIURIL) 25 MG tablet, carvedilol (COREG) 6.25 MG tablet, losartan (COZAAR) 100 MG tablet  Pure hypercholesterolemia - Plan: rosuvastatin (CRESTOR) 5 MG tablet  Situational mixed anxiety and depressive disorder - Plan: citalopram (CELEXA) 40 MG tablet, busPIRone (BUSPAR) 15 MG tablet  Moderate episode of recurrent major depressive disorder ([place]), Chronic - Plan: citalopram (CELEXA) 40 MG tablet  Binge eating symptoms are well controlled with diet Durene Fruits.  Focus has been good.  She is having no red flag signs or symptoms.  Up-to-date on UDS and CSC.  The national narcotic database reviewed and there were no red flags.  I have scheduled 66-monthfollow-up for first week in December.  She is aware of date and time  Blood pressure  under excellent control.  Continue current meds as prescribed for now but may need to reduce losartan if blood pressure drops any further as she is currently borderline low blood pressure.  Refills have been sent to the pharmacy.  Not yet due for fasting lipid but she continues statin, lifestyle modification as she has been  Depression anxiety well controlled with current regimen.  Continue meds as directed  Start time: 11:59am End time: 12:08a  Total time spent on patient care (including video visit/ documentation): 9 minutes  [place] [phone]

## 2022-02-07 NOTE — Telephone Encounter (Signed)
She is UTD on UDS/ CSC so ok to make VIDEO visit since requires face to face for controlled substance.

## 2022-03-13 ENCOUNTER — Telehealth: Payer: Self-pay | Admitting: *Deleted

## 2022-03-13 NOTE — Telephone Encounter (Signed)
Contacted regarding PREP class referral. Has accepted 05/02/2022 class start date at the [facility]. Will contact to set up an appointment for intake assessment close to start date.

## 2022-03-15 DIAGNOSIS — G4733 Obstructive sleep apnea (adult) (pediatric): Secondary | ICD-10-CM | POA: Diagnosis not present

## 2022-04-03 ENCOUNTER — Telehealth: Payer: Self-pay | Admitting: *Deleted

## 2022-04-03 NOTE — Telephone Encounter (Signed)
Contacted to arrange intake assessment for PREP class. Scheduled for 04/24/2022 at 1215 at the [facility].

## 2022-04-24 ENCOUNTER — Encounter: Payer: Self-pay | Admitting: *Deleted

## 2022-04-24 NOTE — Progress Notes (Signed)
[employer] PREP Evaluation  Patient Details  Name: Jamie Mcgee MRN: [id number] Date of Birth: 1961-03-30 Age: 61 y.o. PCP: [name], DO  Vitals:   04/24/22 1215  BP: 106/82  Pulse: 78  Resp: 20  SpO2: 95%  Weight: 185 lb (83.9 kg)     [employer] Eval - 04/24/22 1300       [employer] "PREP" Location   [facility] "PREP" Location [facility]      Referral    Referring Provider [place]    Reason for referral Obesitity/Overweight;Inactivity;Hypertension;High Cholesterol;Current Smoker    Program Start Date 05/02/22      Measurement   Waist Circumference 42.75 inches    Hip Circumference 43.25 inches    Body fat 44.5 percent      Information for Trainer   Goals increase respiratory stamina, weight loss 10-12 lbs in 12 weeks, establish exercise program    Current Exercise walking occasionally    Orthopedic Concerns shoulder soreness bilaterally, arthritis joints, c-spine fusion, bilateral foot neuropathy   self reported   Current Barriers none    Medications that affect exercise Beta blocker      Mobility and Daily Activities   I find it easy to walk up or down two or more flights of stairs. 1    I have no trouble taking out the trash. 4    I do housework such as vacuuming and dusting on my own without difficulty. 4    I can easily lift a gallon of milk (8lbs). 4    I can easily walk a mile. 4    I have no trouble reaching into high cupboards or reaching down to pick up something from the floor. 4    I do not have trouble doing out-door work such as Armed forces logistics/support/administrative officer, raking leaves, or gardening. 3      Mobility and Daily Activities   I feel younger than my age. 4    I feel independent. 4    I feel energetic. 2    I live an active life.  4    I feel strong. 4    I feel healthy. 2    I feel active as other people my age. 1      How fit and strong are you.   Fit and Strong Total Score 45            Past Medical History:  Diagnosis Date   Anxiety    Arthritis     Binge eating disorder    Carpal tunnel syndrome on right    Cervical stenosis of spine    C5-6   COPD (chronic obstructive pulmonary disease) (HCC)    Gold III   Depression    Fibromuscular dysplasia of renal artery ([place]) 2006   GERD (gastroesophageal reflux disease)    Headache    migraines 20 years ago   Hepatitis    type A in 1977   HNP (herniated nucleus pulposus), cervical    HPV (human papilloma virus) infection    HTN (hypertension)    Neuropathy    Vitamin D deficiency    Past Surgical History:  Procedure Laterality Date   ANTERIOR CERVICAL DECOMP/DISCECTOMY FUSION N/A 10/05/2017   Procedure: C5-6 ANTERIOR CERVICAL DECOMPRESSION/DISCECTOMY FUSION,  ALLOGRAFT, PLATE  (NECK FIRST AND CARPAL TUNNEL RELEASE SECOND);  Surgeon: Marybelle Killings, MD;  Location: [place];  Service: Orthopedics;  Laterality: N/A;   APPENDECTOMY  1993   BREAST BIOPSY Right  CARPAL TUNNEL RELEASE Right 10/05/2017   Procedure: RIGHT CARPAL TUNNEL RELEASE;  Surgeon: Marybelle Killings, MD;  Location: [place];  Service: Orthopedics;  Laterality: Right;   CATARACT EXTRACTION  08-2010   left    CATARACT EXTRACTION W/PHACO  06/01/2011   Procedure: CATARACT EXTRACTION PHACO AND INTRAOCULAR LENS PLACEMENT (IOC);  Surgeon: Tonny Branch;  Location: [facility];  Service: Ophthalmology;  Laterality: Right;  CDE:9.62   CHOLECYSTECTOMY     COLONOSCOPY WITH PROPOFOL N/A 07/06/2020   Procedure: COLONOSCOPY WITH PROPOFOL;  Surgeon: Eloise Harman, DO;  [facility];  Service: Endoscopy;  Laterality: N/A;  11:00am   RENAL ARTERY ANGIOPLASTY  2006   right   TUBAL LIGATION  1984   [facility]   Social History   Tobacco Use  Smoking Status Former   Packs/day: 1.00   Years: 40.00   Total pack years: 40.00   Types: Cigarettes  Smokeless Tobacco Never  Tobacco Comments   Quit 11/15/21    Norris Cross 04/24/2022, 4:09 PM  PREP Class to begin 05/02/2021 meets every Tuesday and Thursday from 1400-1515 at the [facility] for 12 weeks.

## 2022-05-02 ENCOUNTER — Encounter: Payer: Self-pay | Admitting: *Deleted

## 2022-05-02 NOTE — Progress Notes (Signed)
[employer] PREP Weekly Session  Patient Details  Name: Jamie Mcgee MRN: [id number] Date of Birth: [birth year] Age: 61 y.o. PCP: [name], DO  Vitals:   05/02/22 2157  Weight: 187 lb (84.8 kg)     [employer] Weekly seesion - 05/02/22 1600       [employer] "PREP" Location   [facility] "PREP" Location [facility]      Weekly Session   Topic Discussed Goal setting and welcome to the program   Introductions, workbook reviewed, tour of facility   Minutes exercised this week 45 minutes    Classes attended to date [place], [place] 05/02/2022, 9:59 PM

## 2022-05-08 ENCOUNTER — Encounter: Payer: Self-pay | Admitting: Family Medicine

## 2022-05-08 ENCOUNTER — Ambulatory Visit (INDEPENDENT_AMBULATORY_CARE_PROVIDER_SITE_OTHER): Payer: Medicare Other | Admitting: Family Medicine

## 2022-05-08 VITALS — BP 86/58 | HR 76 | Temp 97.8°F | Ht 64.0 in | Wt 186.2 lb

## 2022-05-08 DIAGNOSIS — M5441 Lumbago with sciatica, right side: Secondary | ICD-10-CM | POA: Diagnosis not present

## 2022-05-08 DIAGNOSIS — M25512 Pain in left shoulder: Secondary | ICD-10-CM

## 2022-05-08 DIAGNOSIS — R41840 Attention and concentration deficit: Secondary | ICD-10-CM

## 2022-05-08 DIAGNOSIS — F5081 Binge eating disorder: Secondary | ICD-10-CM

## 2022-05-08 DIAGNOSIS — M5442 Lumbago with sciatica, left side: Secondary | ICD-10-CM | POA: Diagnosis not present

## 2022-05-08 DIAGNOSIS — G8929 Other chronic pain: Secondary | ICD-10-CM

## 2022-05-08 MED ORDER — METHYLPREDNISOLONE ACETATE 40 MG/ML IJ SUSP
40.0000 mg | Freq: Once | INTRAMUSCULAR | Status: AC
  Administered 2022-05-08: 40 mg via INTRAMUSCULAR

## 2022-05-08 MED ORDER — LISDEXAMFETAMINE DIMESYLATE 30 MG PO CAPS: 30.0000 mg | ORAL_CAPSULE | Freq: Every day | ORAL | 0 refills | Status: DC

## 2022-05-08 MED ORDER — NITROGLYCERIN 0.4 MG SL SUBL: 0.4000 mg | SUBLINGUAL_TABLET | SUBLINGUAL | 3 refills | Status: DC | PRN

## 2022-05-08 MED ORDER — PREGABALIN 150 MG PO CAPS: ORAL_CAPSULE | ORAL | 1 refills | Status: DC

## 2022-05-08 NOTE — Progress Notes (Signed)
Subjective: CC: Morbid obesity PCP: [name], DO [id number] K Shaffer is a 61 y.o. female presenting to clinic today for:  1.  Follow-up obesity/hypertension Patient has continued on Vyvanse 30 mg daily and this seems to continue to work well to help her with her binge eating behaviors.  She notes that her blood pressure has been dipping low some days such that she will feel slightly dizzy with position changes.  She continues to hydrate fairly.  On hydrochlorothiazide, Cozaar and Coreg.  No edema.  No chest pain or shortness of breath.  2.  Shoulder pain Patient reports some left-sided shoulder and neck pain.  She denies any weakness in that left upper extremity but notes that the symptoms seem to start after she started working out regularly.  She may have missed use the equipment and will be seeing a new trainer soon  ROS: Per HPI  Allergies  Allergen Reactions   Lipitor [Atorvastatin] Other (See Comments)    Muscle aches and nausea   Naproxen Nausea And Vomiting    Stomach pain   Past Medical History:  Diagnosis Date   Anxiety    Arthritis    Binge eating disorder    Carpal tunnel syndrome on right    Cervical stenosis of spine    C5-6   COPD (chronic obstructive pulmonary disease) ([place])    Gold III   Depression    Fibromuscular dysplasia of renal artery ([place]) 2006   GERD (gastroesophageal reflux disease)    Headache    migraines 20 years ago   Hepatitis    type A in 1977   HNP (herniated nucleus pulposus), cervical    HPV (human papilloma virus) infection    HTN (hypertension)    Neuropathy    Vitamin D deficiency     Current Outpatient Medications:    albuterol (VENTOLIN HFA) 108 (90 Base) MCG/ACT inhaler, Inhale 2 puffs into the lungs every 6 (six) hours as needed for wheezing or shortness of breath., Disp: 8 g, Rfl: 3   Budeson-Glycopyrrol-Formoterol (BREZTRI AEROSPHERE) 160-9-4.8 MCG/ACT AERO, Inhale 2 puffs into the lungs 2 (two) times daily.,  Disp: 10.7 g, Rfl: 11   busPIRone (BUSPAR) 15 MG tablet, Take 1 tablet (15 mg total) by mouth 2 (two) times daily. For anxiety, Disp: 180 tablet, Rfl: 1   CALCIUM-VITAMIN D PO, Take 1 tablet by mouth daily., Disp: , Rfl:    carvedilol (COREG) 6.25 MG tablet, Take 1 tablet (6.25 mg total) by mouth 2 (two) times daily with a meal., Disp: 180 tablet, Rfl: 3   citalopram (CELEXA) 40 MG tablet, Take 1 tablet (40 mg total) by mouth daily., Disp: 90 tablet, Rfl: 3   fexofenadine (ALLEGRA) 180 MG tablet, Take 180 mg by mouth daily as needed., Disp: , Rfl:    hydrochlorothiazide (HYDRODIURIL) 25 MG tablet, Take 1 tablet (25 mg total) by mouth daily., Disp: 90 tablet, Rfl: 3   lisdexamfetamine (VYVANSE) 30 MG capsule, Take 1 capsule (30 mg total) by mouth daily., Disp: 30 capsule, Rfl: 0   lisdexamfetamine (VYVANSE) 30 MG capsule, Take 1 capsule (30 mg total) by mouth daily., Disp: 30 capsule, Rfl: 0   lisdexamfetamine (VYVANSE) 30 MG capsule, Take 1 capsule (30 mg total) by mouth daily., Disp: 30 capsule, Rfl: 0   losartan (COZAAR) 100 MG tablet, Take 1 tablet (100 mg total) by mouth daily., Disp: 90 tablet, Rfl: 3   nitroGLYCERIN (NITROSTAT) 0.4 MG SL tablet, Place 1 tablet (0.4 mg total)  under the tongue every 5 (five) minutes as needed., Disp: 25 tablet, Rfl: 3   pantoprazole (PROTONIX) 20 MG tablet, TAKE ONE TABLET BY MOUTH DAILY FOR ACID REFLUX, Disp: 90 tablet, Rfl: 3   pregabalin (LYRICA) 150 MG capsule, Take '150mg'$  each morning and '300mg'$  each evening. Pharmacy: Cancel previous rx, Disp: 270 capsule, Rfl: 1   rosuvastatin (CRESTOR) 5 MG tablet, Take 1 tablet (5 mg total) by mouth at bedtime., Disp: 90 tablet, Rfl: 3   TURMERIC PO, Take by mouth., Disp: , Rfl:  Social History   Socioeconomic History   Marital status: Divorced    Spouse name: Not on file   Number of children: Not on file   Years of education: Not on file   Highest education level: Not on file  Occupational History   Not on file   Tobacco Use   Smoking status: Former    Packs/day: 1.00    Years: 40.00    Total pack years: 40.00    Types: Cigarettes   Smokeless tobacco: Never   Tobacco comments:    Quit 11/15/21  Vaping Use   Vaping Use: Never used  Substance and Sexual Activity   Alcohol use: No    Alcohol/week: 0.0 standard drinks of alcohol   Drug use: Yes    Frequency: 0.5 times per week    Types: Marijuana    Comment: daily   Sexual activity: Not Currently    Birth control/protection: Post-menopausal, Surgical    Comment: tubal  Other Topics Concern   Not on file  Social History Narrative   The patient does not have any sex drive and has not had one for almost a year.   Her   husband thinks she does not love him anymore.  He does not seem to understand   her   depression.   Social Determinants of Health   Financial Resource Strain: Medium Risk (11/18/2021)   Overall Financial Resource Strain (CARDIA)    Difficulty of Paying Living Expenses: Somewhat hard  Food Insecurity: Food Insecurity Present (11/18/2021)   Hunger Vital Sign    Worried About Running Out of Food in the Last Year: Sometimes true    Ran Out of Food in the Last Year: Often true  Transportation Needs: No Transportation Needs (11/18/2021)   PRAPARE - Hydrologist (Medical): No    Lack of Transportation (Non-Medical): No  Physical Activity: Insufficiently Active (11/18/2021)   Exercise Vital Sign    Days of Exercise per Week: 4 days    Minutes of Exercise per Session: 20 min  Stress: No Stress Concern Present (11/18/2021)   [place]    Feeling of Stress : Only a little  Social Connections: Socially Isolated (11/18/2021)   Social Connection and Isolation Panel [NHANES]    Frequency of Communication with Friends and Family: Three times a week    Frequency of Social Gatherings with Friends and Family: Once a week    Attends Religious  Services: Never    Marine scientist or Organizations: No    Attends Archivist Meetings: Never    Marital Status: Divorced  Human resources officer Violence: Not At Risk (11/18/2021)   Humiliation, Afraid, Rape, and Kick questionnaire    Fear of Current or Ex-Partner: No    Emotionally Abused: No    Physically Abused: No    Sexually Abused: No   Family History  Problem Relation Age of  Onset   Coronary artery disease Father        States congenital abnormality   Diabetes Father    Hyperlipidemia Father    CAD Sister        Stents    Cancer Sister        breast   Cancer Mother        breast   Cancer Maternal Aunt        breast   Cancer Maternal Grandmother        breast   Anesthesia problems Neg Hx    Hypotension Neg Hx    Malignant hyperthermia Neg Hx    Pseudochol deficiency Neg Hx     Objective: Office vital signs reviewed. BP (!) 86/58   Pulse 76   Temp 97.8 F (36.6 C)   Ht '5\' 4"'$  (1.626 m)   Wt 186 lb 3.2 oz (84.5 kg)   LMP 05/25/2011   SpO2 97%   BMI 31.96 kg/m   Physical Examination:  General: Awake, alert, well nourished, No acute distress HEENT:sclera white, MMM Cardio: regular rate and rhythm, S1S2 heard, no murmurs appreciated Pulm: clear to auscultation bilaterally, no wheezes, rhonchi or rales; normal work of breathing on room air MSK: Increased tonicity of the trapezius muscle.  Painful arc sign present  neuro: Alert and oriented.  Follows commands  Assessment/ Plan: 61 y.o. female   Acute pain of left shoulder - Plan: methylPREDNISolone acetate (DEPO-MEDROL) injection 40 mg  Morbid obesity (HCC) - Plan: lisdexamfetamine (VYVANSE) 30 MG capsule, lisdexamfetamine (VYVANSE) 30 MG capsule, lisdexamfetamine (VYVANSE) 30 MG capsule  Attention and concentration deficit - Plan: lisdexamfetamine (VYVANSE) 30 MG capsule, lisdexamfetamine (VYVANSE) 30 MG capsule, lisdexamfetamine (VYVANSE) 30 MG capsule  Binge eating disorder - Plan:  lisdexamfetamine (VYVANSE) 30 MG capsule, lisdexamfetamine (VYVANSE) 30 MG capsule, lisdexamfetamine (VYVANSE) 30 MG capsule  Chronic bilateral low back pain with bilateral sciatica - Plan: pregabalin (LYRICA) 150 MG capsule, methylPREDNISolone acetate (DEPO-MEDROL) injection 40 mg  Depo-Medrol injection provided.  Offered physical therapy referral but she would like to hold off on this and work with her trainer.  If needed may consider referral to orthopedics for further evaluation.  She will let me know if this is needed  Binging disorder and ADHD well-controlled with Vyvanse.  She is down over 20 pounds since initiation of the medicine.  This has been renewed.  Up-to-date on UDS and CSC.  National narcotic database reviewed and there were no red flags.  Blood pressure is too low so we will discontinue hydrochlorothiazide and she will monitor.  If blood pressure continues to be below 110 over 50s she will cut the losartan in half to 50 mg.  Lyrica renewed.  No orders of the defined types were placed in this encounter.  No orders of the defined types were placed in this encounter.    [name], DO [place] [phone]

## 2022-05-08 NOTE — Patient Instructions (Signed)
STOP hydrochlorathiazide MONITOR Blood pressure.  IF BP <110/50, cut the losartan down to 1/2 tablet daily.

## 2022-05-10 ENCOUNTER — Telehealth: Payer: Self-pay | Admitting: *Deleted

## 2022-05-10 NOTE — Telephone Encounter (Signed)
Contacted patient to inquire if she still planned on attending PREP Class. She did come to the first class but has missed the last two.

## 2022-05-10 NOTE — Telephone Encounter (Signed)
Contacted by text from patient stating she would be unable to participate in PREP at this time for personal reasons. Requests contacting next year with availability of classes.

## 2022-05-16 ENCOUNTER — Other Ambulatory Visit: Payer: Self-pay | Admitting: Family Medicine

## 2022-05-16 ENCOUNTER — Encounter: Payer: Self-pay | Admitting: Family Medicine

## 2022-05-16 DIAGNOSIS — M25512 Pain in left shoulder: Secondary | ICD-10-CM

## 2022-06-06 ENCOUNTER — Encounter: Payer: Self-pay | Admitting: Orthopedic Surgery

## 2022-06-06 ENCOUNTER — Ambulatory Visit (INDEPENDENT_AMBULATORY_CARE_PROVIDER_SITE_OTHER): Payer: Medicare Other

## 2022-06-06 ENCOUNTER — Ambulatory Visit: Payer: Medicare Other | Admitting: Orthopedic Surgery

## 2022-06-06 VITALS — BP 149/76 | HR 67 | Ht 64.0 in | Wt 184.0 lb

## 2022-06-06 DIAGNOSIS — G8929 Other chronic pain: Secondary | ICD-10-CM

## 2022-06-06 DIAGNOSIS — M25512 Pain in left shoulder: Secondary | ICD-10-CM | POA: Diagnosis not present

## 2022-06-06 NOTE — Patient Instructions (Addendum)

## 2022-06-07 ENCOUNTER — Encounter: Payer: Self-pay | Admitting: Orthopedic Surgery

## 2022-06-07 NOTE — Progress Notes (Signed)
New Patient Visit  Assessment: Jamie Mcgee is a 62 y.o. female with the following: 1. Chronic left shoulder pain  Plan: Jamie Mcgee has been having left shoulder pain for the past few months.  This coincided with an increase in her activity.  She continues to have excellent range of motion.  She has good strength.  However, there is pain with motion.  She has had injections in the past and would like to proceed with a left shoulder injection today.  This was completed without issues.  She will follow-up as needed.  Procedure note injection Left shoulder    Verbal consent was obtained to inject the left shoulder, subacromial space Timeout was completed to confirm the site of injection.  The skin was prepped with alcohol and ethyl chloride was sprayed at the injection site.  A 21-gauge needle was used to inject 40 mg of Depo-Medrol and 1% lidocaine (3 cc) into the subacromial space of the left shoulder using a posterolateral approach.  There were no complications. A sterile bandage was applied.   Follow-up: Return if symptoms worsen or fail to improve.  Subjective:  Chief Complaint  Patient presents with   Shoulder Pain    Lt shoulder pain for 4-5 mos. Pt states she started working out around the same which she feels it the cause.     History of Present Illness: Jamie Mcgee is a 62 y.o. female who has been referred by Ronnie Doss, DO for evaluation of left shoulder pain.  She has had pain in her left shoulder for the past 4 to 5 months.  No specific injury.  She states that she had recently started to increase her level activity.  She started to workout at the gym.  As such, she started about pain in the posterior aspect of the left shoulder.  Medications have not been helpful.  She has had to reduce her activities.  She does continue to have good range of motion.  No prior injuries to her left shoulder.  She has not had an injection in the left shoulder.   Review of  Systems: No fevers or chills No numbness or tingling No chest pain No shortness of breath No bowel or bladder dysfunction No GI distress No headaches   Medical History:  Past Medical History:  Diagnosis Date   Anxiety    Arthritis    Binge eating disorder    Carpal tunnel syndrome on right    Cervical stenosis of spine    C5-6   COPD (chronic obstructive pulmonary disease) (HCC)    Gold III   Depression    Fibromuscular dysplasia of renal artery ([place]) 2006   GERD (gastroesophageal reflux disease)    Headache    migraines 20 years ago   Hepatitis    type A in 1977   HNP (herniated nucleus pulposus), cervical    HPV (human papilloma virus) infection    HTN (hypertension)    Neuropathy    Vitamin D deficiency     Past Surgical History:  Procedure Laterality Date   ANTERIOR CERVICAL DECOMP/DISCECTOMY FUSION N/A 10/05/2017   Procedure: C5-6 ANTERIOR CERVICAL DECOMPRESSION/DISCECTOMY FUSION,  ALLOGRAFT, PLATE  (NECK FIRST AND CARPAL TUNNEL RELEASE SECOND);  Surgeon: Marybelle Killings, MD;  Location: [facility];  Service: Orthopedics;  Laterality: N/A;   APPENDECTOMY  1993   BREAST BIOPSY Right    CARPAL TUNNEL RELEASE Right 10/05/2017   Procedure: RIGHT CARPAL TUNNEL RELEASE;  Surgeon: Marybelle Killings,  MD;  Location: [facility];  Service: Orthopedics;  Laterality: Right;   CATARACT EXTRACTION  08-2010   left    CATARACT EXTRACTION W/PHACO  06/01/2011   Procedure: CATARACT EXTRACTION PHACO AND INTRAOCULAR LENS PLACEMENT (IOC);  Surgeon: Tonny Branch;  Location: [facility];  Service: Ophthalmology;  Laterality: Right;  CDE:9.62   CHOLECYSTECTOMY     COLONOSCOPY WITH PROPOFOL N/A 07/06/2020   Procedure: COLONOSCOPY WITH PROPOFOL;  Surgeon: Eloise Harman, DO;  [facility];  Service: Endoscopy;  Laterality: N/A;  11:00am   RENAL ARTERY ANGIOPLASTY  2006   right   TUBAL LIGATION  1984   [facility]    Family History  Problem Relation Age of Onset   Coronary artery disease Father         States congenital abnormality   Diabetes Father    Hyperlipidemia Father    CAD Sister        Stents    Cancer Sister        breast   Cancer Mother        breast   Cancer Maternal Aunt        breast   Cancer Maternal Grandmother        breast   Anesthesia problems Neg Hx    Hypotension Neg Hx    Malignant hyperthermia Neg Hx    Pseudochol deficiency Neg Hx    Social History   Tobacco Use   Smoking status: Former    Packs/day: 1.00    Years: 40.00    Total pack years: 40.00    Types: Cigarettes   Smokeless tobacco: Never   Tobacco comments:    Quit 11/15/21  Vaping Use   Vaping Use: Never used  Substance Use Topics   Alcohol use: No    Alcohol/week: 0.0 standard drinks of alcohol   Drug use: Yes    Frequency: 0.5 times per week    Types: Marijuana    Comment: daily    Allergies  Allergen Reactions   Lipitor [Atorvastatin] Other (See Comments)    Muscle aches and nausea   Naproxen Nausea And Vomiting    Stomach pain    No outpatient medications have been marked as taking for the 06/06/22 encounter (Office Visit) with Mordecai Rasmussen, MD.    Objective: BP (!) 149/76   Pulse 67   Ht '5\' 4"'$  (1.626 m)   Wt 184 lb (83.5 kg)   LMP 05/25/2011   BMI 31.58 kg/m   Physical Exam:  General: Alert and oriented. and No acute distress. Gait: Normal gait.  Evaluation left shoulder demonstrates no deformity.  No atrophy is appreciated.  Tenderness palpation of the posterior lateral aspect of the left shoulder.  She has near full range of motion.  She does have good strength, but some pain is appreciated with strength testing.  No numbness or tingling distally.  Sensation is intact.  2+ radial pulse.  IMAGING: I personally ordered and reviewed the following images  X-rays of the left shoulder were obtained in clinic today.  No acute injuries are noted.  There appears to be an osteophyte off the inferior aspect of the humeral head, with mild loss of joint space within the  glenohumeral joint.  It is possible, that this is a small fragment off the anterior aspect the glenoid.  Current imaging is insufficient to conclude.  No evidence of proximal humeral migration.  Mild loss of joint space in the [facility] joint.  Minimal osteophytes.  Impression: Left  shoulder x-ray without acute injury, evidence of early arthritic changes within the glenohumeral and AC joints.   New Medications:  No orders of the defined types were placed in this encounter.     Mordecai Rasmussen, MD  06/07/2022 11:14 AM

## 2022-06-19 ENCOUNTER — Ambulatory Visit (HOSPITAL_COMMUNITY)
Admission: RE | Admit: 2022-06-19 | Discharge: 2022-06-19 | Disposition: A | Discharge status: Home / Self Care | Payer: Medicare Other | Source: Ambulatory Visit | Attending: Hematology | Admitting: Hematology

## 2022-06-19 DIAGNOSIS — Z87891 Personal history of nicotine dependence: Secondary | ICD-10-CM | POA: Diagnosis not present

## 2022-06-19 DIAGNOSIS — Z122 Encounter for screening for malignant neoplasm of respiratory organs: Secondary | ICD-10-CM | POA: Insufficient documentation

## 2022-06-23 ENCOUNTER — Ambulatory Visit: Payer: Medicare Other | Admitting: Family Medicine

## 2022-06-27 NOTE — Progress Notes (Unsigned)
Subjective:   Jamie Mcgee is a 62 y.o. female who presents for Medicare Annual (Subsequent) preventive examination.  Review of Systems    ***       Objective:    There were no vitals filed for this visit. There is no height or weight on file to calculate BMI.     11/30/2021   10:10 AM 11/11/2021   11:03 AM 06/20/2021    7:08 PM 05/10/2021    2:55 PM 07/06/2020   11:32 AM 07/05/2020    8:42 AM 02/28/2018    8:56 AM  Advanced Directives  Does Patient Have a Medical Advance Directive? Yes Yes No No No No No  Type of Paramedic of [place];Living will [place];Living will       Does patient want to make changes to medical advance directive? No - Patient declined No - Patient declined       Copy of [place] in Chart? No - copy requested No - copy requested       Would patient like information on creating a medical advance directive? No - Patient declined   Yes (MAU/Ambulatory/Procedural Areas - Information given)  No - Patient declined     Current Medications (verified) Outpatient Encounter Medications as of 06/28/2022  Medication Sig   albuterol (VENTOLIN HFA) 108 (90 Base) MCG/ACT inhaler Inhale 2 puffs into the lungs every 6 (six) hours as needed for wheezing or shortness of breath.   Budeson-Glycopyrrol-Formoterol (BREZTRI AEROSPHERE) 160-9-4.8 MCG/ACT AERO Inhale 2 puffs into the lungs 2 (two) times daily.   busPIRone (BUSPAR) 15 MG tablet Take 1 tablet (15 mg total) by mouth 2 (two) times daily. For anxiety   CALCIUM-VITAMIN D PO Take 1 tablet by mouth daily.   carvedilol (COREG) 6.25 MG tablet Take 1 tablet (6.25 mg total) by mouth 2 (two) times daily with a meal.   citalopram (CELEXA) 40 MG tablet Take 1 tablet (40 mg total) by mouth daily.   fexofenadine (ALLEGRA) 180 MG tablet Take 180 mg by mouth daily as needed.   lisdexamfetamine (VYVANSE) 30 MG capsule Take 1 capsule (30 mg total) by mouth daily.    lisdexamfetamine (VYVANSE) 30 MG capsule Take 1 capsule (30 mg total) by mouth daily.   [START ON 07/14/2022] lisdexamfetamine (VYVANSE) 30 MG capsule Take 1 capsule (30 mg total) by mouth daily.   losartan (COZAAR) 100 MG tablet Take 1 tablet (100 mg total) by mouth daily.   nitroGLYCERIN (NITROSTAT) 0.4 MG SL tablet Place 1 tablet (0.4 mg total) under the tongue every 5 (five) minutes as needed.   pantoprazole (PROTONIX) 20 MG tablet TAKE ONE TABLET BY MOUTH DAILY FOR ACID REFLUX   pregabalin (LYRICA) 150 MG capsule Take '150mg'$  each morning and '300mg'$  each evening. Pharmacy: Cancel previous rx   rosuvastatin (CRESTOR) 5 MG tablet Take 1 tablet (5 mg total) by mouth at bedtime.   TURMERIC PO Take by mouth.   No facility-administered encounter medications on file as of 06/28/2022.    Allergies (verified) Lipitor [atorvastatin] and Naproxen   History: Past Medical History:  Diagnosis Date   Anxiety    Arthritis    Binge eating disorder    Carpal tunnel syndrome on right    Cervical stenosis of spine    C5-6   COPD (chronic obstructive pulmonary disease) (HCC)    Gold III   Depression    Fibromuscular dysplasia of renal artery ([place]) 2006   GERD (gastroesophageal  reflux disease)    Headache    migraines 20 years ago   Hepatitis    type A in 1977   HNP (herniated nucleus pulposus), cervical    HPV (human papilloma virus) infection    HTN (hypertension)    Neuropathy    Vitamin D deficiency    Past Surgical History:  Procedure Laterality Date   ANTERIOR CERVICAL DECOMP/DISCECTOMY FUSION N/A 10/05/2017   Procedure: C5-6 ANTERIOR CERVICAL DECOMPRESSION/DISCECTOMY FUSION,  ALLOGRAFT, PLATE  (NECK FIRST AND CARPAL TUNNEL RELEASE SECOND);  Surgeon: Marybelle Killings, MD;  Location: [facility];  Service: Orthopedics;  Laterality: N/A;   APPENDECTOMY  1993   BREAST BIOPSY Right    CARPAL TUNNEL RELEASE Right 10/05/2017   Procedure: RIGHT CARPAL TUNNEL RELEASE;  Surgeon: Marybelle Killings, MD;  Location:  [facility];  Service: Orthopedics;  Laterality: Right;   CATARACT EXTRACTION  08-2010   left    CATARACT EXTRACTION W/PHACO  06/01/2011   Procedure: CATARACT EXTRACTION PHACO AND INTRAOCULAR LENS PLACEMENT (IOC);  Surgeon: Tonny Branch;  Location: [facility];  Service: Ophthalmology;  Laterality: Right;  CDE:9.62   CHOLECYSTECTOMY     COLONOSCOPY WITH PROPOFOL N/A 07/06/2020   Procedure: COLONOSCOPY WITH PROPOFOL;  Surgeon: Eloise Harman, DO;  Location[facility]COPY;  Service: Endoscopy;  Laterality: N/A;  11:00am   RENAL ARTERY ANGIOPLASTY  2006   right   TUBAL LIGATION  1984   [facility]   Family History  Problem Relation Age of Onset   Coronary artery disease Father        States congenital abnormality   Diabetes Father    Hyperlipidemia Father    CAD Sister        Stents    Cancer Sister        breast   Cancer Mother        breast   Cancer Maternal Aunt        breast   Cancer Maternal Grandmother        breast   Anesthesia problems Neg Hx    Hypotension Neg Hx    Malignant hyperthermia Neg Hx    Pseudochol deficiency Neg Hx    Social History   Socioeconomic History   Marital status: Divorced    Spouse name: Not on file   Number of children: Not on file   Years of education: Not on file   Highest education level: Not on file  Occupational History   Not on file  Tobacco Use   Smoking status: Former    Packs/day: 1.00    Years: 40.00    Total pack years: 40.00    Types: Cigarettes   Smokeless tobacco: Never   Tobacco comments:    Quit 11/15/21  Vaping Use   Vaping Use: Never used  Substance and Sexual Activity   Alcohol use: No    Alcohol/week: 0.0 standard drinks of alcohol   Drug use: Yes    Frequency: 0.5 times per week    Types: Marijuana    Comment: daily   Sexual activity: Not Currently    Birth control/protection: Post-menopausal, Surgical    Comment: tubal  Other Topics Concern   Not on file  Social History Narrative   The patient does not have any sex  drive and has not had one for almost a year.   Her   husband thinks she does not love him anymore.  He does not seem to understand   her   depression.  Social Determinants of Health   Financial Resource Strain: Medium Risk (11/18/2021)   Overall Financial Resource Strain (CARDIA)    Difficulty of Paying Living Expenses: Somewhat hard  Food Insecurity: Food Insecurity Present (11/18/2021)   Hunger Vital Sign    Worried About Running Out of Food in the Last Year: Sometimes true    Ran Out of Food in the Last Year: Often true  Transportation Needs: No Transportation Needs (11/18/2021)   PRAPARE - Hydrologist (Medical): No    Lack of Transportation (Non-Medical): No  Physical Activity: Insufficiently Active (11/18/2021)   Exercise Vital Sign    Days of Exercise per Week: 4 days    Minutes of Exercise per Session: 20 min  Stress: No Stress Concern Present (11/18/2021)   [place]    Feeling of Stress : Only a little  Social Connections: Socially Isolated (11/18/2021)   Social Connection and Isolation Panel [NHANES]    Frequency of Communication with Friends and Family: Three times a week    Frequency of Social Gatherings with Friends and Family: Once a week    Attends Religious Services: Never    Marine scientist or Organizations: No    Attends Archivist Meetings: Never    Marital Status: Divorced    Tobacco Counseling Counseling given: Not Answered Tobacco comments: Quit 11/15/21   Clinical Intake:              How often do you need to have someone help you when you read instructions, pamphlets, or other written materials from your doctor or pharmacy?: (P) 1 - Never  Diabetic?No          Activities of Daily Living    06/24/2022    9:12 AM  In your present state of health, do you have any difficulty performing the following activities:  Hearing? 0   Vision? 1  Difficulty concentrating or making decisions? 0  Walking or climbing stairs? 0  Dressing or bathing? 0  Doing errands, shopping? 0  Preparing Food and eating ? N  Using the Toilet? N  In the past six months, have you accidently leaked urine? N  Do you have problems with loss of bowel control? N  Managing your Medications? N  Managing your Finances? N  Housekeeping or managing your Housekeeping? N    Patient Care Team: [name], DO as PCP - General (Family Medicine) Harl Bowie Alphonse Guild, MD as PCP - Cardiology (Cardiology) Eloise Harman, DO as Consulting Physician (Internal Medicine) Derek Jack, MD as Medical Oncologist (Hematology)  Indicate any recent Medical Services you may have received from other than [facility] providers in the past year (date may be approximate).     Assessment:   This is a routine wellness examination for Jamie Mcgee.  Hearing/Vision screen No results found.  Dietary issues and exercise activities discussed:     Goals Addressed   None    Depression Screen    05/08/2022    2:24 PM 11/18/2021   12:45 PM 11/11/2021    1:08 PM 10/11/2021    8:35 AM 05/10/2021    2:07 PM 04/13/2021    9:32 AM 04/13/2021    9:31 AM  PHQ 2/9 Scores  PHQ - 2 Score '4 2 2 4 '$ 0 0 0  PHQ- 9 Score '15 7 4 15 '$ 0 0     Fall Risk    06/24/2022    9:12  AM 05/08/2022    2:24 PM 11/11/2021    1:07 PM 10/11/2021    8:35 AM 07/01/2021   10:55 AM  Fall Risk   Falls in the past year? 0 0 0 0 0  Number falls in past yr: 0      Injury with Fall? 0        FALL RISK PREVENTION PERTAINING TO THE HOME:  Any stairs in or around the home? {YES/NO:21197} If so, are there any without handrails? {YES/NO:21197} Home free of loose throw rugs in walkways, pet beds, electrical cords, etc? {YES/NO:21197} Adequate lighting in your home to reduce risk of falls? {YES/NO:21197}  ASSISTIVE DEVICES UTILIZED TO PREVENT FALLS:  Life alert? {YES/NO:21197} Use of a cane,  walker or w/c? {YES/NO:21197} Grab bars in the bathroom? {YES/NO:21197} Shower chair or bench in shower? {YES/NO:21197} Elevated toilet seat or a handicapped toilet? {YES/NO:21197}  TIMED UP AND GO:  Was the test performed? No . Telephonic visit   Cognitive Function:    05/10/2021    2:07 PM  MMSE - Mini Mental State Exam  Orientation to time 5  Orientation to Place 5  Registration 3  Attention/ Calculation 5  Recall 3  Language- name 2 objects 2  Language- repeat 1  Language- follow 3 step command 3  Language- read & follow direction 1  Write a sentence 1  Copy design 1  Total score 30        Immunizations Immunization History  Administered Date(s) Administered   Influenza,inj,Quad PF,6+ Mos 03/15/2018, 03/10/2020   Moderna Sars-Covid-2 Vaccination 10/06/2019   PNEUMOCOCCAL CONJUGATE-20 04/13/2021   Pneumococcal Polysaccharide-23 01/04/2018   Tdap 01/04/2018   Zoster Recombinat (Shingrix) 10/11/2020, 03/24/2021    TDAP status: Up to date  {Flu Vaccine status:2101806}  Pneumococcal vaccine status: Up to date  Covid-19 vaccine status: Information provided on how to obtain vaccines.   Qualifies for Shingles Vaccine? Yes   Zostavax completed No   Shingrix Completed?: Yes  Screening Tests Health Maintenance  Topic Date Due   COVID-19 Vaccine (2 - Moderna risk series) 11/03/2019   Medicare Annual Wellness (AWV)  05/10/2022   INFLUENZA VACCINE  09/03/2022 (Originally 01/03/2022)   MAMMOGRAM  01/31/2023   DEXA SCAN  05/14/2023   Lung Cancer Screening  06/20/2023   PAP SMEAR-Modifier  11/18/2024   DTaP/Tdap/Td (2 - Td or Tdap) 01/05/2028   COLONOSCOPY (Pts 45-65yr Insurance coverage will need to be confirmed)  07/06/2030   Hepatitis C Screening  Completed   HIV Screening  Completed   Zoster Vaccines- Shingrix  Completed   HPV VACCINES  Aged Out    Health Maintenance  Health Maintenance Due  Topic Date Due   COVID-19 Vaccine (2 - Moderna risk series)  11/03/2019   Medicare Annual Wellness (AWV)  05/10/2022    Colorectal cancer screening: Type of screening: Colonoscopy. Completed 07/06/20. Repeat every 10 years  Mammogram status: Completed 01/30/22. Repeat every year  Lung Cancer Screening: (Low Dose CT Chest recommended if Age 63-80years, 30 pack-year currently smoking OR have quit w/in 15years.) does qualify.   Lung Cancer Screening Referral: last visit 06/19/22  Additional Screening:  Hepatitis C Screening: does qualify; Completed 01/11/18  Vision Screening: Recommended annual ophthalmology exams for early detection of glaucoma and other disorders of the eye. Is the patient up to date with their annual eye exam?  {YES/NO:21197} Who is the provider or what is the name of the office in which the patient attends annual eye exams? *** If  pt is not established with a provider, would they like to be referred to a provider to establish care? {YES/NO:21197}.   Dental Screening: Recommended annual dental exams for proper oral hygiene  Community Resource Referral / Chronic Care Management: CRR required this visit?  {YES/NO:21197}  CCM required this visit?  {YES/NO:21197}     Plan:     I have personally reviewed and noted the following in the patient's chart:   Medical and social history Use of alcohol, tobacco or illicit drugs  Current medications and supplements including opioid prescriptions. {Opioid Prescriptions:7037159404} Functional ability and status Nutritional status Physical activity Advanced directives List of other physicians Hospitalizations, surgeries, and ER visits in previous 12 months Vitals Screenings to include cognitive, depression, and falls Referrals and appointments  In addition, I have reviewed and discussed with patient certain preventive protocols, quality metrics, and best practice recommendations. A written personalized care plan for preventive services as well as general preventive health  recommendations were provided to patient.     Vanetta Mulders, [place]   6/83/4196   Due to this being a virtual visit, the after visit summary with patients personalized plan was offered to patient via mail or my-chart. ***Patient declined at this time./ Patient would like to access on my-chart/ per request, patient was mailed a copy of AVS./ Patient preferred to pick up at office at next visit  Nurse Notes: ***

## 2022-06-27 NOTE — Patient Instructions (Signed)
Jamie Mcgee , Thank you for taking time to come for your Medicare Wellness Visit. I appreciate your ongoing commitment to your health goals. Please review the following plan we discussed and let me know if I can assist you in the future.   These are the goals we discussed:  Goals      Increase physical activity     Quit Smoking        This is a list of the screening recommended for you and due dates:  Health Maintenance  Topic Date Due   COVID-19 Vaccine (2 - Moderna risk series) 11/03/2019   Medicare Annual Wellness Visit  05/10/2022   Flu Shot  09/03/2022*   Mammogram  01/31/2023   DEXA scan (bone density measurement)  05/14/2023   Screening for Lung Cancer  06/20/2023   Pap Smear  11/18/2024   DTaP/Tdap/Td vaccine (2 - Td or Tdap) 01/05/2028   Colon Cancer Screening  07/06/2030   Hepatitis C Screening: USPSTF Recommendation to screen - Ages 18-79 yo.  Completed   HIV Screening  Completed   Zoster (Shingles) Vaccine  Completed   HPV Vaccine  Aged Out  *Topic was postponed. The date shown is not the original due date.    Advanced directives: ***  Conditions/risks identified: Aim for 30 minutes of exercise or brisk walking, 6-8 glasses of water, and 5 servings of fruits and vegetables each day.   Next appointment: Follow up in one year for your annual wellness visit.   Preventive Care 40-64 Years, Female Preventive care refers to lifestyle choices and visits with your health care provider that can promote health and wellness. What does preventive care include? A yearly physical exam. This is also called an annual well check. Dental exams once or twice a year. Routine eye exams. Ask your health care provider how often you should have your eyes checked. Personal lifestyle choices, including: Daily care of your teeth and gums. Regular physical activity. Eating a healthy diet. Avoiding tobacco and drug use. Limiting alcohol use. Practicing safe sex. Taking low-dose aspirin  daily starting at age 31. Taking vitamin and mineral supplements as recommended by your health care provider. What happens during an annual well check? The services and screenings done by your health care provider during your annual well check will depend on your age, overall health, lifestyle risk factors, and family history of disease. Counseling  Your health care provider may ask you questions about your: Alcohol use. Tobacco use. Drug use. Emotional well-being. Home and relationship well-being. Sexual activity. Eating habits. Work and work Statistician. Method of birth control. Menstrual cycle. Pregnancy history. Screening  You may have the following tests or measurements: Height, weight, and BMI. Blood pressure. Lipid and cholesterol levels. These may be checked every 5 years, or more frequently if you are over 69 years old. Skin check. Lung cancer screening. You may have this screening every year starting at age 21 if you have a 30-pack-year history of smoking and currently smoke or have quit within the past 15 years. Fecal occult blood test (FOBT) of the stool. You may have this test every year starting at age 70. Flexible sigmoidoscopy or colonoscopy. You may have a sigmoidoscopy every 5 years or a colonoscopy every 10 years starting at age 43. Hepatitis C blood test. Hepatitis B blood test. Sexually transmitted disease (STD) testing. Diabetes screening. This is done by checking your blood sugar (glucose) after you have not eaten for a while (fasting). You may have this done  every 1-3 years. Mammogram. This may be done every 1-2 years. Talk to your health care provider about when you should start having regular mammograms. This may depend on whether you have a family history of breast cancer. BRCA-related cancer screening. This may be done if you have a family history of breast, ovarian, tubal, or peritoneal cancers. Pelvic exam and Pap test. This may be done every 3 years  starting at age 10. Starting at age 34, this may be done every 5 years if you have a Pap test in combination with an HPV test. Bone density scan. This is done to screen for osteoporosis. You may have this scan if you are at high risk for osteoporosis. Discuss your test results, treatment options, and if necessary, the need for more tests with your health care provider. Vaccines  Your health care provider may recommend certain vaccines, such as: Influenza vaccine. This is recommended every year. Tetanus, diphtheria, and acellular pertussis (Tdap, Td) vaccine. You may need a Td booster every 10 years. Zoster vaccine. You may need this after age 90. Pneumococcal 13-valent conjugate (PCV13) vaccine. You may need this if you have certain conditions and were not previously vaccinated. Pneumococcal polysaccharide (PPSV23) vaccine. You may need one or two doses if you smoke cigarettes or if you have certain conditions. Talk to your health care provider about which screenings and vaccines you need and how often you need them. This information is not intended to replace advice given to you by your health care provider. Make sure you discuss any questions you have with your health care provider. Document Released: 06/18/2015 Document Revised: 02/09/2016 Document Reviewed: 03/23/2015 Elsevier Interactive Patient Education  2017 [place] Prevention in the Home Falls can cause injuries. They can happen to people of all ages. There are many things you can do to make your home safe and to help prevent falls. What can I do on the outside of my home? Regularly fix the edges of walkways and driveways and fix any cracks. Remove anything that might make you trip as you walk through a door, such as a raised step or threshold. Trim any bushes or trees on the path to your home. Use bright outdoor lighting. Clear any walking paths of anything that might make someone trip, such as rocks or  tools. Regularly check to see if handrails are loose or broken. Make sure that both sides of any steps have handrails. Any raised decks and porches should have guardrails on the edges. Have any leaves, snow, or ice cleared regularly. Use sand or salt on walking paths during winter. Clean up any spills in your garage right away. This includes oil or grease spills. What can I do in the bathroom? Use night lights. Install grab bars by the toilet and in the tub and shower. Do not use towel bars as grab bars. Use non-skid mats or decals in the tub or shower. If you need to sit down in the shower, use a plastic, non-slip stool. Keep the floor dry. Clean up any water that spills on the floor as soon as it happens. Remove soap buildup in the tub or shower regularly. Attach bath mats securely with double-sided non-slip rug tape. Do not have throw rugs and other things on the floor that can make you trip. What can I do in the bedroom? Use night lights. Make sure that you have a light by your bed that is easy to reach. Do not use any sheets  or blankets that are too big for your bed. They should not hang down onto the floor. Have a firm chair that has side arms. You can use this for support while you get dressed. Do not have throw rugs and other things on the floor that can make you trip. What can I do in the kitchen? Clean up any spills right away. Avoid walking on wet floors. Keep items that you use a lot in easy-to-reach places. If you need to reach something above you, use a strong step stool that has a grab bar. Keep electrical cords out of the way. Do not use floor polish or wax that makes floors slippery. If you must use wax, use non-skid floor wax. Do not have throw rugs and other things on the floor that can make you trip. What can I do with my stairs? Do not leave any items on the stairs. Make sure that there are handrails on both sides of the stairs and use them. Fix handrails that are  broken or loose. Make sure that handrails are as long as the stairways. Check any carpeting to make sure that it is firmly attached to the stairs. Fix any carpet that is loose or worn. Avoid having throw rugs at the top or bottom of the stairs. If you do have throw rugs, attach them to the floor with carpet tape. Make sure that you have a light switch at the top of the stairs and the bottom of the stairs. If you do not have them, ask someone to add them for you. What else can I do to help prevent falls? Wear shoes that: Do not have high heels. Have rubber bottoms. Are comfortable and fit you well. Are closed at the toe. Do not wear sandals. If you use a stepladder: Make sure that it is fully opened. Do not climb a closed stepladder. Make sure that both sides of the stepladder are locked into place. Ask someone to hold it for you, if possible. Clearly mark and make sure that you can see: Any grab bars or handrails. First and last steps. Where the edge of each step is. Use tools that help you move around (mobility aids) if they are needed. These include: Canes. Walkers. Scooters. Crutches. Turn on the lights when you go into a dark area. Replace any light bulbs as soon as they burn out. Set up your furniture so you have a clear path. Avoid moving your furniture around. If any of your floors are uneven, fix them. If there are any pets around you, be aware of where they are. Review your medicines with your doctor. Some medicines can make you feel dizzy. This can increase your chance of falling. Ask your doctor what other things that you can do to help prevent falls. This information is not intended to replace advice given to you by your health care provider. Make sure you discuss any questions you have with your health care provider. Document Released: 03/18/2009 Document Revised: 10/28/2015 Document Reviewed: 06/26/2014 Elsevier Interactive Patient Education  2017 [employer].

## 2022-06-28 ENCOUNTER — Ambulatory Visit (INDEPENDENT_AMBULATORY_CARE_PROVIDER_SITE_OTHER): Payer: Medicare Other

## 2022-06-28 VITALS — Ht 64.0 in | Wt 186.0 lb

## 2022-06-28 DIAGNOSIS — Z0000 Encounter for general adult medical examination without abnormal findings: Secondary | ICD-10-CM | POA: Diagnosis not present

## 2022-07-12 ENCOUNTER — Encounter: Payer: Self-pay | Admitting: Pulmonary Disease

## 2022-07-12 ENCOUNTER — Ambulatory Visit: Payer: Medicare Other | Admitting: Pulmonary Disease

## 2022-07-12 VITALS — BP 130/78 | HR 72 | Ht 64.0 in | Wt 189.2 lb

## 2022-07-12 DIAGNOSIS — J431 Panlobular emphysema: Secondary | ICD-10-CM | POA: Diagnosis not present

## 2022-07-12 DIAGNOSIS — G4733 Obstructive sleep apnea (adult) (pediatric): Secondary | ICD-10-CM | POA: Diagnosis not present

## 2022-07-12 NOTE — Assessment & Plan Note (Signed)
To tolerate CPAP and she has quit using.  She has lost weight from 206 pounds at the time of diagnosis to 189 pounds now and has been as low as 181.  Still likely sleep apnea is improved. She is not a candidate for mouthguard since her dental loss and/or hypoglossal nerve stimulator implant I encouraged further weight loss

## 2022-07-12 NOTE — Patient Instructions (Signed)
Congratulations on Quitting  !  Continue Breztri

## 2022-07-12 NOTE — Assessment & Plan Note (Signed)
Continue Breztri Use albuterol for rescue. We discussed signs and symptoms of COPD exacerbation and COPD action plan. Congratulated her on smoking cessation, she is at high risk for relapse

## 2022-07-12 NOTE — Progress Notes (Signed)
   Subjective:    Patient ID: Jamie Mcgee, female    DOB: Jan 18, 1961, 62 y.o.   MRN: [id number]  HPI  62 yo smoker for follow-up of gold II COPD and OSA -Unable to tolerate CPAP She smoked more than 60 pack years she quit multiple times, longest for 7 months using nicotine patch and vaping.  Chantix gave her nightmares Tried nicotrol inhaler  Chief Complaint  Patient presents with   Follow-up    Not using cpap mask is hard for her     7-monthfollow-up visit.  She had a chest cold but did not get too sick. She has quit smoking in June 2023.  Her roommate still smokes but she does not have the energy anymore. She is compliant with BJudithann Sauger has rarely needed albuterol.  We reviewed low-dose CT chest from January We had started her on a CPAP machine in March, she was using it intermittently until November but has been unable to tolerate and has quit using.  She feels the machine is paid for.  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  09/2017 spirometry moderate restriction, ratio 76, FEV1 54%, FVC 55%   07/2017 spirometry moderate airway obstruction, ratio 65, FEV1 38%, FVC  46%   N PSG 01/2018-206 pounds-AHI 7/hour lowest desaturation 88%  Review of Systems neg for any significant sore throat, dysphagia, itching, sneezing, nasal congestion or excess/ purulent secretions, fever, chills, sweats, unintended wt loss, pleuritic or exertional cp, hempoptysis, orthopnea pnd or change in chronic leg swelling. Also denies presyncope, palpitations, heartburn, abdominal pain,  nausea, vomiting, diarrhea or change in bowel or urinary habits, dysuria,hematuria, rash, arthralgias, visual complaints, headache, numbness weakness or ataxia.     Objective:   Physical Exam   Gen. Pleasant, obese, in no distress ENT - no lesions, no post nasal drip Neck: No JVD, no thyromegaly, no carotid bruits Lungs: no use of accessory muscles, no dullness to percussion, decreased without rales or rhonchi  Cardiovascular: Rhythm regular, heart sounds  normal, no murmurs or gallops, no peripheral edema Musculoskeletal: No deformities, no cyanosis or clubbing , no tremors        Assessment & Plan:

## 2022-07-16 ENCOUNTER — Other Ambulatory Visit: Payer: Self-pay | Admitting: Pulmonary Disease

## 2022-08-02 NOTE — Progress Notes (Signed)
Follow up cancelled per patient.  Attempts made to reschedule.

## 2022-08-03 ENCOUNTER — Encounter: Payer: Self-pay | Admitting: Radiology

## 2022-08-04 ENCOUNTER — Other Ambulatory Visit: Payer: Self-pay | Admitting: Family Medicine

## 2022-08-04 DIAGNOSIS — G8929 Other chronic pain: Secondary | ICD-10-CM

## 2022-08-04 DIAGNOSIS — F4323 Adjustment disorder with mixed anxiety and depressed mood: Secondary | ICD-10-CM

## 2022-08-14 ENCOUNTER — Ambulatory Visit (INDEPENDENT_AMBULATORY_CARE_PROVIDER_SITE_OTHER): Payer: Medicare Other | Admitting: Family Medicine

## 2022-08-14 ENCOUNTER — Encounter: Payer: Self-pay | Admitting: Family Medicine

## 2022-08-14 VITALS — BP 123/70 | HR 67 | Temp 98.8°F | Ht 64.0 in | Wt 188.0 lb

## 2022-08-14 DIAGNOSIS — E7800 Pure hypercholesterolemia, unspecified: Secondary | ICD-10-CM | POA: Diagnosis not present

## 2022-08-14 DIAGNOSIS — F331 Major depressive disorder, recurrent, moderate: Secondary | ICD-10-CM

## 2022-08-14 DIAGNOSIS — M5442 Lumbago with sciatica, left side: Secondary | ICD-10-CM

## 2022-08-14 DIAGNOSIS — R932 Abnormal findings on diagnostic imaging of liver and biliary tract: Secondary | ICD-10-CM | POA: Diagnosis not present

## 2022-08-14 DIAGNOSIS — Z6832 Body mass index (BMI) 32.0-32.9, adult: Secondary | ICD-10-CM

## 2022-08-14 DIAGNOSIS — Z0001 Encounter for general adult medical examination with abnormal findings: Secondary | ICD-10-CM

## 2022-08-14 DIAGNOSIS — R739 Hyperglycemia, unspecified: Secondary | ICD-10-CM | POA: Diagnosis not present

## 2022-08-14 DIAGNOSIS — Z79899 Other long term (current) drug therapy: Secondary | ICD-10-CM

## 2022-08-14 DIAGNOSIS — J431 Panlobular emphysema: Secondary | ICD-10-CM

## 2022-08-14 DIAGNOSIS — G8929 Other chronic pain: Secondary | ICD-10-CM

## 2022-08-14 DIAGNOSIS — I10 Essential (primary) hypertension: Secondary | ICD-10-CM | POA: Diagnosis not present

## 2022-08-14 DIAGNOSIS — R41840 Attention and concentration deficit: Secondary | ICD-10-CM

## 2022-08-14 DIAGNOSIS — M5441 Lumbago with sciatica, right side: Secondary | ICD-10-CM

## 2022-08-14 DIAGNOSIS — Z0000 Encounter for general adult medical examination without abnormal findings: Secondary | ICD-10-CM

## 2022-08-14 LAB — BAYER DCA HB A1C WAIVED: HB A1C (BAYER DCA - WAIVED): 5.5 % (ref 4.8–5.6)

## 2022-08-14 MED ORDER — LISDEXAMFETAMINE DIMESYLATE 40 MG PO CAPS: 40.0000 mg | ORAL_CAPSULE | Freq: Every day | ORAL | 0 refills | Status: DC

## 2022-08-14 MED ORDER — PREGABALIN 300 MG PO CAPS: ORAL_CAPSULE | ORAL | 5 refills | Status: DC

## 2022-08-14 NOTE — Patient Instructions (Signed)
Cirrhosis  Cirrhosis is long-term (chronic) liver injury. The liver is the body's largest internal organ, and it performs many functions. It converts food into energy, removes toxic material from the blood, makes important proteins, and absorbs necessary vitamins from food. In cirrhosis, healthy liver cells are replaced by scar tissue. This prevents blood from flowing through the liver and makes it difficult for the liver to complete its functions. What are the causes? Common causes of this condition are hepatitis C and long-term alcohol abuse. Other causes include: Nonalcoholic fatty liver disease (NAFLD). This happens when fat is deposited in the liver by causes other than alcohol. Hepatitis B infection. Autoimmune hepatitis. In this condition, the body's defense system (immune system) mistakenly attacks the liver cells, causing inflammation. Diseases that cause blockage of ducts inside the liver. Inherited liver diseases, such as hemochromatosis. This is one of the most common inherited liver diseases. In this disease, deposits of iron collect in the liver and other organs. Reactions to certain long-term medicines, such as amiodarone, a heart medicine. Parasitic infections. These include schistosomiasis, which is caused by a flatworm. Long-term contact to certain toxins. These toxins include certain organic solvents, such as toluene and chloroform. What increases the risk? You are more likely to develop this condition if: You have certain types of viral hepatitis. You abuse alcohol, especially if you are female. You are overweight. You use IV drugs and share needles. You have unprotected sex with someone who has viral hepatitis. What are the signs or symptoms? You may not have any signs and symptoms at first. Symptoms may not develop until the damage to your liver starts to get worse. Early symptoms may include: Weakness and tiredness (fatigue). Changes in sleep patterns or having trouble  sleeping. Itchiness. Tenderness in the right-upper part of your abdomen. Weight loss and muscle loss. Nausea. Loss of appetite. Later symptoms may include: Fatigue or weakness that is getting worse. Yellow skin and eyes (jaundice). Buildup of fluid in the abdomen (ascites). You may notice that your clothes are tight around your waist. Weight gain and swelling of the feet and ankles (edema). Trouble breathing. Easy bruising and bleeding. Vomiting blood, or black or bloody stool. Mental confusion. How is this diagnosed? Your health care provider may suspect cirrhosis based on your symptoms and medical history, especially if you have other medical conditions or a history of alcohol abuse. Your health care provider will do a physical exam to feel your liver and to check for signs of cirrhosis. Tests may include: Blood tests to check: For hepatitis B or C. Kidney function. Liver function. Imaging tests such as: MRI or CT scan to look for changes seen in advanced cirrhosis. Ultrasound to see if normal liver tissue is being replaced by scar tissue. A procedure in which a long needle is used to take a sample of liver tissue to be checked in a lab (biopsy). Liver biopsy can confirm the diagnosis of cirrhosis. How is this treated? Treatment for this condition depends on how damaged your liver is and what caused the damage. It may include treating the symptoms of cirrhosis, or treating the underlying causes to slow the damage. Treatment may include: Making lifestyle changes, such as: Eating a healthy diet. You may need to work with your health care provider or a dietitian to develop an eating plan. Restricting salt intake. Maintaining a healthy weight. Not abusing drugs or alcohol. Taking medicines to: Treat liver infections or other infections. Control itching. Reduce fluid buildup. Reduce certain blood   toxins. Reduce risk of bleeding from enlarged blood vessels in the stomach or esophagus  (varices). Liver transplant. In this procedure, a liver from a donor is used to replace your diseased liver. This is done if cirrhosis has caused liver failure. Other treatments and procedures may be done depending on the problems that you get from cirrhosis. Common problems include liver-related kidney failure (hepatorenal syndrome). Follow these instructions at home:  Take medicines only as told by your health care provider. Do not use medicines that are toxic to your liver. Ask your health care provider before taking any new medicines, including over-the-counter medicines such as NSAIDs. Rest as needed. Eat a well-balanced diet. Limit your salt or water intake, if your health care provider asks you to do this. Do not drink alcohol. This is especially important if you routinely take acetaminophen. Keep all follow-up visits. This is important. Contact a health care provider if you: Have fatigue or weakness that is getting worse. Develop swelling of the hands, feet, or legs, or a buildup of fluid in the abdomen (ascites). Have a fever or chills. Develop loss of appetite. Have nausea or vomiting. Develop jaundice. Develop easy bruising or bleeding. Get help right away if you: Vomit bright red blood or a material that looks like coffee grounds. Have blood in your stools. Notice that your stools appear black and tarry. Become confused. Have chest pain or trouble breathing. These symptoms may represent a serious problem that is an emergency. Do not wait to see if the symptoms will go away. Get medical help right away. Call your local emergency services (911 in the [place] Do not drive yourself to the hospital. Summary Cirrhosis is chronic liver injury. Common causes are hepatitis C and long-term alcohol abuse. Tests used to diagnose cirrhosis include blood tests, imaging tests, and liver biopsy. Treatment for this condition involves treating the underlying cause. Avoid alcohol, drugs, salt,  and medicines that may damage your liver. Get help right away if you vomit bright red blood or a material that looks like coffee grounds. This information is not intended to replace advice given to you by your health care provider. Make sure you discuss any questions you have with your health care provider. Document Revised: 03/04/2020 Document Reviewed: 03/04/2020 Elsevier Patient Education  2023 Elsevier Inc.  

## 2022-08-14 NOTE — Progress Notes (Signed)
Jamie Mcgee is a 62 y.o. female presents to office today for annual physical exam examination.    Concerns today include: 1. Chronic back pain Chronic back pain is stable with 300 mg of Lyrica at bedtime only.  She is not having any excessive daytime sedation or other issues.  She did sustain a mechanical fall in a pair of new [employer] shoes  2. ADHD/ Binge eating Feels like binge eating disorder has gotten worse.  Thinks that she would benefit from higher dose of Vyvanse.  Currently taking 30 mg daily.  Has chronic dry mouth but this has not been exacerbated by use of Vyvanse.  Utilizes colon cleanse daily which keeps her bowel movements regular.  3.  Abnormal liver CT Had CAT scan of her lungs for yearly screening and this demonstrated some irregular margins of the liver suggestive of cirrhosis.  She has been told previously by a surgeon that removed her gallbladder that she had evidence of fatty liver and she would like to further evaluate this because she does not want to have cirrhosis.  No jaundice, nausea, vomiting or belly pain reported.  She does not follow a low-fat diet and frequently snacks on candies and chips  Substance use: smokes "Hemp Flower" Diet: high sugar, Exercise: no structured Last colonoscopy: UTD Last mammogram: UTD Last pap smear: UTD Refills needed today: all Immunizations needed: Immunization History  Administered Date(s) Administered   Fluad Quad(high Dose 65+) 04/06/2022   Influenza,inj,Quad PF,6+ Mos 03/15/2018, 03/10/2020   Moderna Sars-Covid-2 Vaccination 10/06/2019   PNEUMOCOCCAL CONJUGATE-20 04/13/2021   Pneumococcal Polysaccharide-23 01/04/2018   Tdap 01/04/2018   Zoster Recombinat (Shingrix) 10/11/2020, 03/24/2021     Past Medical History:  Diagnosis Date   Allergy    Anxiety    Arthritis    Binge eating disorder    Carpal tunnel syndrome on right    Cataract    Both lenses been replaced   Cervical stenosis of spine    C5-6   COPD  (chronic obstructive pulmonary disease) (HCC)    Gold III   Depression    Emphysema of lung (HCC)    Fibromuscular dysplasia of renal artery ([place]) 2006   GERD (gastroesophageal reflux disease)    Headache    migraines 20 years ago   Hepatitis    type A in 1977   HNP (herniated nucleus pulposus), cervical    HPV (human papilloma virus) infection    HTN (hypertension)    Neuropathy    Sleep apnea    Vitamin D deficiency    Social History   Socioeconomic History   Marital status: Divorced    Spouse name: Not on file   Number of children: Not on file   Years of education: Not on file   Highest education level: Not on file  Occupational History   Not on file  Tobacco Use   Smoking status: Every Day    Packs/day: 1.00    Years: 40.00    Total pack years: 40.00    Types: Cigarettes    Last attempt to quit: 11/01/2021    Years since quitting: 0.7   Smokeless tobacco: Never   Tobacco comments:    Quit 11/15/21  Vaping Use   Vaping Use: Never used  Substance and Sexual Activity   Alcohol use: No   Drug use: Yes    Frequency: 0.5 times per week    Types: Marijuana    Comment: daily   Sexual activity: Not Currently  Birth control/protection: Abstinence, Post-menopausal    Comment: tubal  Other Topics Concern   Not on file  Social History Narrative   The patient does not have any sex drive and has not had one for almost a year.   Her   husband thinks she does not love him anymore.  He does not seem to understand   her   depression.   Social Determinants of Health   Financial Resource Strain: Low Risk  (06/28/2022)   Overall Financial Resource Strain (CARDIA)    Difficulty of Paying Living Expenses: Not hard at all  Food Insecurity: Food Insecurity Present (06/28/2022)   Hunger Vital Sign    Worried About Running Out of Food in the Last Year: Sometimes true    Ran Out of Food in the Last Year: Sometimes true  Transportation Needs: No Transportation Needs (06/28/2022)    PRAPARE - Hydrologist (Medical): No    Lack of Transportation (Non-Medical): No  Physical Activity: Inactive (06/28/2022)   Exercise Vital Sign    Days of Exercise per Week: 0 days    Minutes of Exercise per Session: 0 min  Stress: No Stress Concern Present (06/28/2022)   Guys    Feeling of Stress : Not at all  Social Connections: Socially Isolated (06/28/2022)   Social Connection and Isolation Panel [NHANES]    Frequency of Communication with Friends and Family: Three times a week    Frequency of Social Gatherings with Friends and Family: Once a week    Attends Religious Services: Never    Marine scientist or Organizations: No    Attends Archivist Meetings: Never    Marital Status: Divorced  Human resources officer Violence: Not At Risk (06/28/2022)   Humiliation, Afraid, Rape, and Kick questionnaire    Fear of Current or Ex-Partner: No    Emotionally Abused: No    Physically Abused: No    Sexually Abused: No   Past Surgical History:  Procedure Laterality Date   ANTERIOR CERVICAL DECOMP/DISCECTOMY FUSION N/A 10/05/2017   Procedure: C5-6 ANTERIOR CERVICAL DECOMPRESSION/DISCECTOMY FUSION,  ALLOGRAFT, PLATE  (NECK FIRST AND CARPAL TUNNEL RELEASE SECOND);  Surgeon: Marybelle Killings, MD;  Location: [place];  Service: Orthopedics;  Laterality: N/A;   APPENDECTOMY  1993   BREAST BIOPSY Right    CARPAL TUNNEL RELEASE Right 10/05/2017   Procedure: RIGHT CARPAL TUNNEL RELEASE;  Surgeon: Marybelle Killings, MD;  Location: [place];  Service: Orthopedics;  Laterality: Right;   CATARACT EXTRACTION  08/2010   left    CATARACT EXTRACTION W/PHACO  06/01/2011   Procedure: CATARACT EXTRACTION PHACO AND INTRAOCULAR LENS PLACEMENT (IOC);  Surgeon: Tonny Branch;  Location: [facility];  Service: Ophthalmology;  Laterality: Right;  CDE:9.62   CHOLECYSTECTOMY     COLONOSCOPY WITH PROPOFOL N/A 07/06/2020    Procedure: COLONOSCOPY WITH PROPOFOL;  Surgeon: Eloise Harman, DO;  Location: [facility];  Service: Endoscopy;  Laterality: N/A;  11:00am   EYE SURGERY     Cataract removal   RENAL ARTERY ANGIOPLASTY  2006   right   TUBAL LIGATION  1984   [facility]   Family History  Problem Relation Age of Onset   Coronary artery disease Father        States congenital abnormality   Diabetes Father    Hyperlipidemia Father    Early death Father    CAD Sister  Stents    Cancer Sister        breast   Anxiety disorder Sister    Asthma Sister    Depression Sister    Obesity Sister    Cancer Mother        breast   Anxiety disorder Mother    Arthritis Mother    Depression Mother    Obesity Mother    Cancer Maternal Aunt        breast   Cancer Maternal Grandmother        breast   Heart disease Maternal Grandfather    Anesthesia problems Neg Hx    Hypotension Neg Hx    Malignant hyperthermia Neg Hx    Pseudochol deficiency Neg Hx     Current Outpatient Medications:    BREZTRI AEROSPHERE 160-9-4.8 MCG/ACT AERO, USE 2 INHALATIONS BY MOUTH TWICE DAILY, Disp: 32.1 g, Rfl: 3   albuterol (VENTOLIN HFA) 108 (90 Base) MCG/ACT inhaler, Inhale 2 puffs into the lungs every 6 (six) hours as needed for wheezing or shortness of breath., Disp: 8 g, Rfl: 3   busPIRone (BUSPAR) 15 MG tablet, TAKE ONE TABLET BY MOUTH TWICE DAILY FOR ANXIETY, Disp: 180 tablet, Rfl: 0   CALCIUM-VITAMIN D PO, Take 1 tablet by mouth daily., Disp: , Rfl:    carvedilol (COREG) 6.25 MG tablet, Take 1 tablet (6.25 mg total) by mouth 2 (two) times daily with a meal., Disp: 180 tablet, Rfl: 3   citalopram (CELEXA) 40 MG tablet, Take 1 tablet (40 mg total) by mouth daily., Disp: 90 tablet, Rfl: 3   fexofenadine (ALLEGRA) 180 MG tablet, Take 180 mg by mouth daily as needed., Disp: , Rfl:    lisdexamfetamine (VYVANSE) 30 MG capsule, Take 1 capsule (30 mg total) by mouth daily., Disp: 30 capsule, Rfl: 0   lisdexamfetamine (VYVANSE)  30 MG capsule, Take 1 capsule (30 mg total) by mouth daily., Disp: 30 capsule, Rfl: 0   lisdexamfetamine (VYVANSE) 30 MG capsule, Take 1 capsule (30 mg total) by mouth daily., Disp: 30 capsule, Rfl: 0   losartan (COZAAR) 100 MG tablet, Take 1 tablet (100 mg total) by mouth daily., Disp: 90 tablet, Rfl: 3   nitroGLYCERIN (NITROSTAT) 0.4 MG SL tablet, Place 1 tablet (0.4 mg total) under the tongue every 5 (five) minutes as needed., Disp: 25 tablet, Rfl: 3   pantoprazole (PROTONIX) 20 MG tablet, TAKE ONE TABLET BY MOUTH DAILY FOR ACID REFLUX, Disp: 90 tablet, Rfl: 3   pregabalin (LYRICA) 150 MG capsule, Take '150mg'$  each morning and '300mg'$  each evening. Pharmacy: Cancel previous rx, Disp: 270 capsule, Rfl: 1   rosuvastatin (CRESTOR) 5 MG tablet, Take 1 tablet (5 mg total) by mouth at bedtime., Disp: 90 tablet, Rfl: 3   TURMERIC PO, Take by mouth., Disp: , Rfl:   Allergies  Allergen Reactions   Lipitor [Atorvastatin] Other (See Comments)    Muscle aches and nausea   Naproxen Nausea And Vomiting    Stomach pain     ROS: Review of Systems Pertinent items noted in HPI and positive for occasional urinary incontinence and remainder of comprehensive ROS otherwise negative.    Physical exam BP 123/70   Pulse 67   Temp 98.8 F (37.1 C)   Ht '5\' 4"'$  (1.626 m)   Wt 188 lb (85.3 kg)   LMP 05/25/2011   SpO2 98%   BMI 32.27 kg/m  General appearance: alert, cooperative, appears stated age, and moderately obese Head: Normocephalic, without obvious abnormality, atraumatic Eyes:  negative findings: lids and lashes normal, conjunctivae and sclerae normal, corneas clear, and pupils equal, round, reactive to light and accomodation Ears: normal TM's and external ear canals both ears Nose: Nares normal. Septum midline. Mucosa normal. No drainage or sinus tenderness. Throat:  poor dentition, mmm, no oropharyngeal masses or lesions Neck: no adenopathy, no JVD, supple, symmetrical, trachea midline, and thyroid  not enlarged, symmetric, no tenderness/mass/nodules Back: symmetric, no curvature. ROM normal. No CVA tenderness. Lungs:  Globally decreased breath sounds but no wheezes, rhonchi or rales.  Normal work of breathing on room air Heart: regular rate and rhythm, S1, S2 normal, no murmur, click, rub or gallop Abdomen:  Obese, soft, nontender.  Mild hepatomegaly appreciated Extremities: extremities normal, atraumatic, no cyanosis or edema Pulses: 2+ and symmetric Skin:  Multiple abrasions and healing areas of ecchymosis noted along the right forearm and left anterior knee Lymph nodes: Cervical, supraclavicular, and axillary nodes normal. Neurologic: Grossly normal Psych: Mood stable, speech normal, affect appropriate.  Very pleasant, interactive   Assessment/ Plan: Jamie Mcgee here for annual physical exam.   Annual physical exam  Morbid obesity ([place]) - Plan: Bayer DCA Hb A1c Waived, CMP14+EGFR, Lipid Panel, TSH, lisdexamfetamine (VYVANSE) 40 MG capsule, lisdexamfetamine (VYVANSE) 40 MG capsule, lisdexamfetamine (VYVANSE) 40 MG capsule  Panlobular emphysema (HCC) - Plan: CBC  Moderate episode of recurrent major depressive disorder ([place]) - Plan: TSH  Pure hypercholesterolemia - Plan: CMP14+EGFR, Lipid Panel, TSH  Essential hypertension, benign - Plan: CMP14+EGFR  Attention and concentration deficit - Plan: ToxASSURE Select 13 (MW), Urine, lisdexamfetamine (VYVANSE) 40 MG capsule, lisdexamfetamine (VYVANSE) 40 MG capsule, lisdexamfetamine (VYVANSE) 40 MG capsule  Chronic bilateral low back pain with bilateral sciatica - Plan: ToxASSURE Select 13 (MW), Urine, pregabalin (LYRICA) 300 MG capsule  Controlled substance agreement signed - Plan: ToxASSURE Select 13 (MW), Urine  Abnormal liver CT - Plan: US Abdomen Limited RUQ (LIVER/GB), Ambulatory referral to Gastroenterology  Up-to-date on preventative health care.  Counseled on obesity.  Vyvanse advance.  UDS and CSA updated as per  office policy.  National narcotic database reviewed and there were no red flags  Check CBC given history of emphysema and smoking  Fasting lipid, liver function test and sugar collected today  Blood pressure controlled.  No changes  Lyrica dosing adjusted to reflect current usage.  New Rx sent x 6 months.  Given her abnormal liver findings on previous imaging study we are going to further evaluate this possible nodular liver/possible cirrhosis with an ultrasound and I referred her back to her gastroenterologist at [place] for next steps   Counseled on healthy lifestyle choices, including diet (rich in fruits, vegetables and lean meats and low in salt and simple carbohydrates) and exercise (at least 30 minutes of moderate physical activity daily).  Patient to follow up in 3-14m Morse Brueggemann M. GLajuana Ripple DO

## 2022-08-15 LAB — CMP14+EGFR
ALT: 17 IU/L (ref 0–32)
AST: 25 IU/L (ref 0–40)
Albumin/Globulin Ratio: 1.5 (ref 1.2–2.2)
Albumin: 4.2 g/dL (ref 3.9–4.9)
Alkaline Phosphatase: 105 IU/L (ref 44–121)
BUN/Creatinine Ratio: 12 (ref 12–28)
BUN: 11 mg/dL (ref 8–27)
Bilirubin Total: 0.5 mg/dL (ref 0.0–1.2)
CO2: 27 mmol/L (ref 20–29)
Calcium: 10 mg/dL (ref 8.7–10.3)
Chloride: 94 mmol/L — ABNORMAL LOW (ref 96–106)
Creatinine, Ser: 0.94 mg/dL (ref 0.57–1.00)
Globulin, Total: 2.8 g/dL (ref 1.5–4.5)
Glucose: 118 mg/dL — ABNORMAL HIGH (ref 70–99)
Potassium: 3.6 mmol/L (ref 3.5–5.2)
Sodium: 138 mmol/L (ref 134–144)
Total Protein: 7 g/dL (ref 6.0–8.5)
eGFR: 69 mL/min/{1.73_m2} (ref >59)

## 2022-08-15 LAB — LIPID PANEL
Chol/HDL Ratio: 1.9 ratio (ref 0.0–4.4)
Cholesterol, Total: 134 mg/dL (ref 100–199)
HDL: 69 mg/dL (ref >39)
LDL Chol Calc (NIH): 49 mg/dL (ref 0–99)
Triglycerides: 87 mg/dL (ref 0–149)
VLDL Cholesterol Cal: 16 mg/dL (ref 5–40)

## 2022-08-15 LAB — CBC
Hematocrit: 40.5 % (ref 34.0–46.6)
Hemoglobin: 13.6 g/dL (ref 11.1–15.9)
MCH: 28.9 pg (ref 26.6–33.0)
MCHC: 33.6 g/dL (ref 31.5–35.7)
MCV: 86 fL (ref 79–97)
Platelets: 217 10*3/uL (ref 150–450)
RBC: 4.71 x10E6/uL (ref 3.77–5.28)
RDW: 13.3 % (ref 11.7–15.4)
WBC: 7.2 10*3/uL (ref 3.4–10.8)

## 2022-08-15 LAB — TSH: TSH: 1.88 u[IU]/mL (ref 0.450–4.500)

## 2022-08-17 LAB — TOXASSURE SELECT 13 (MW), URINE

## 2022-08-22 ENCOUNTER — Ambulatory Visit (HOSPITAL_COMMUNITY)
Admission: RE | Admit: 2022-08-22 | Discharge: 2022-08-22 | Disposition: A | Discharge status: Home / Self Care | Payer: Medicare Other | Source: Ambulatory Visit | Attending: Family Medicine | Admitting: Family Medicine

## 2022-08-22 DIAGNOSIS — K7460 Unspecified cirrhosis of liver: Secondary | ICD-10-CM | POA: Diagnosis not present

## 2022-08-22 DIAGNOSIS — R932 Abnormal findings on diagnostic imaging of liver and biliary tract: Secondary | ICD-10-CM | POA: Insufficient documentation

## 2022-08-29 DIAGNOSIS — H524 Presbyopia: Secondary | ICD-10-CM | POA: Diagnosis not present

## 2022-08-29 DIAGNOSIS — H35033 Hypertensive retinopathy, bilateral: Secondary | ICD-10-CM | POA: Diagnosis not present

## 2022-09-04 NOTE — Progress Notes (Signed)
GI Office Note    Referring Provider: [name], DO Primary Care Physician:  [name], DO  Primary Gastroenterologist: Elon Alas. Abbey Chatters, DO   Chief Complaint   No chief complaint on file.   History of Present Illness   Jamie Mcgee is a 62 y.o. female presenting today for cirrhosis at the request of Dr. Lajuana Ripple. Last seen 2022 for colonoscopy.    Patient underwent CT lung cancer screening exam 06/2022. Liver margin noted to be irregular concerning for cirrhosis.   Ruq u/s 08/2022: IMPRESSION: Cirrhotic morphology of the liver. No focal lesion.   Colonoscopy 07/2020: -non-bleeding internal hemorrhoids -next colonoscopy 10 years   Medications   Current Outpatient Medications  Medication Sig Dispense Refill   albuterol (VENTOLIN HFA) 108 (90 Base) MCG/ACT inhaler Inhale 2 puffs into the lungs every 6 (six) hours as needed for wheezing or shortness of breath. 8 g 3   BREZTRI AEROSPHERE 160-9-4.8 MCG/ACT AERO USE 2 INHALATIONS BY MOUTH TWICE DAILY 32.1 g 3   busPIRone (BUSPAR) 15 MG tablet TAKE ONE TABLET BY MOUTH TWICE DAILY FOR ANXIETY 180 tablet 0   CALCIUM-VITAMIN D PO Take 1 tablet by mouth daily.     carvedilol (COREG) 6.25 MG tablet Take 1 tablet (6.25 mg total) by mouth 2 (two) times daily with a meal. 180 tablet 3   citalopram (CELEXA) 40 MG tablet Take 1 tablet (40 mg total) by mouth daily. 90 tablet 3   fexofenadine (ALLEGRA) 180 MG tablet Take 180 mg by mouth daily as needed.     [START ON 10/23/2022] lisdexamfetamine (VYVANSE) 40 MG capsule Take 1 capsule (40 mg total) by mouth daily. 30 capsule 0   lisdexamfetamine (VYVANSE) 40 MG capsule Take 1 capsule (40 mg total) by mouth daily. 30 capsule 0   [START ON 09/23/2022] lisdexamfetamine (VYVANSE) 40 MG capsule Take 1 capsule (40 mg total) by mouth daily. 30 capsule 0   losartan (COZAAR) 100 MG tablet Take 1 tablet (100 mg total) by mouth daily. 90 tablet 3   nitroGLYCERIN (NITROSTAT)  0.4 MG SL tablet Place 1 tablet (0.4 mg total) under the tongue every 5 (five) minutes as needed. 25 tablet 3   pantoprazole (PROTONIX) 20 MG tablet TAKE ONE TABLET BY MOUTH DAILY FOR ACID REFLUX 90 tablet 3   [START ON 11/03/2022] pregabalin (LYRICA) 300 MG capsule Take 300mg  each evening. Pharmacy: Dose change Cancel previous rx 30 capsule 5   rosuvastatin (CRESTOR) 5 MG tablet Take 1 tablet (5 mg total) by mouth at bedtime. 90 tablet 3   TURMERIC PO Take by mouth.     No current facility-administered medications for this visit.    Allergies   Allergies as of 09/05/2022 - Review Complete 08/14/2022  Allergen Reaction Noted   Lipitor [atorvastatin] Other (See Comments) 10/13/2014   Naproxen Nausea And Vomiting 06/04/2015     Past Medical History   Past Medical History:  Diagnosis Date   Allergy    Anxiety    Arthritis    Binge eating disorder    Carpal tunnel syndrome on right    Cataract    Both lenses been replaced   Cervical stenosis of spine    C5-6   COPD (chronic obstructive pulmonary disease) (HCC)    Gold III   Depression    Emphysema of lung (HCC)    Fibromuscular dysplasia of renal artery ([place]) 2006   GERD (gastroesophageal reflux disease)    Headache  migraines 20 years ago   Hepatitis    type A in 1977   HNP (herniated nucleus pulposus), cervical    HPV (human papilloma virus) infection    HTN (hypertension)    Neuropathy    Sleep apnea    Vitamin D deficiency     Past Surgical History   Past Surgical History:  Procedure Laterality Date   ANTERIOR CERVICAL DECOMP/DISCECTOMY FUSION N/A 10/05/2017   Procedure: C5-6 ANTERIOR CERVICAL DECOMPRESSION/DISCECTOMY FUSION,  ALLOGRAFT, PLATE  (NECK FIRST AND CARPAL TUNNEL RELEASE SECOND);  Surgeon: Marybelle Killings, MD;  Location: [facility];  Service: Orthopedics;  Laterality: N/A;   APPENDECTOMY  1993   BREAST BIOPSY Right    CARPAL TUNNEL RELEASE Right 10/05/2017   Procedure: RIGHT CARPAL TUNNEL RELEASE;   Surgeon: Marybelle Killings, MD;  Location: [facility];  Service: Orthopedics;  Laterality: Right;   CATARACT EXTRACTION  08/2010   left    CATARACT EXTRACTION W/PHACO  06/01/2011   Procedure: CATARACT EXTRACTION PHACO AND INTRAOCULAR LENS PLACEMENT (IOC);  Surgeon: Tonny Branch;  Location: [facility];  Service: Ophthalmology;  Laterality: Right;  CDE:9.62   CHOLECYSTECTOMY     COLONOSCOPY WITH PROPOFOL N/A 07/06/2020   Procedure: COLONOSCOPY WITH PROPOFOL;  Surgeon: Eloise Harman, DO;  Location: [facility];  Service: Endoscopy;  Laterality: N/A;  11:00am   EYE SURGERY     Cataract removal   RENAL ARTERY ANGIOPLASTY  2006   right   TUBAL LIGATION  1984   [facility]    Past Family History   Family History  Problem Relation Age of Onset   Coronary artery disease Father        States congenital abnormality   Diabetes Father    Hyperlipidemia Father    Early death Father    CAD Sister        Stents    Cancer Sister        breast   Anxiety disorder Sister    Asthma Sister    Depression Sister    Obesity Sister    Cancer Mother        breast   Anxiety disorder Mother    Arthritis Mother    Depression Mother    Obesity Mother    Cancer Maternal Aunt        breast   Cancer Maternal Grandmother        breast   Heart disease Maternal Grandfather    Anesthesia problems Neg Hx    Hypotension Neg Hx    Malignant hyperthermia Neg Hx    Pseudochol deficiency Neg Hx     Past Social History   Social History   Socioeconomic History   Marital status: Divorced    Spouse name: Not on file   Number of children: Not on file   Years of education: Not on file   Highest education level: Not on file  Occupational History   Not on file  Tobacco Use   Smoking status: Every Day    Packs/day: 1.00    Years: 40.00    Additional pack years: 0.00    Total pack years: 40.00    Types: Cigarettes    Last attempt to quit: 11/01/2021    Years since quitting: 0.8   Smokeless tobacco: Never   Tobacco  comments:    Quit 11/15/21  Vaping Use   Vaping Use: Never used  Substance and Sexual Activity   Alcohol use: No   Drug use: Yes  Frequency: 0.5 times per week    Types: Marijuana    Comment: daily   Sexual activity: Not Currently    Birth control/protection: Abstinence, Post-menopausal    Comment: tubal  Other Topics Concern   Not on file  Social History Narrative   The patient does not have any sex drive and has not had one for almost a year.   Her   husband thinks she does not love him anymore.  He does not seem to understand   her   depression.   Social Determinants of Health   Financial Resource Strain: Low Risk  (06/28/2022)   Overall Financial Resource Strain (CARDIA)    Difficulty of Paying Living Expenses: Not hard at all  Food Insecurity: Food Insecurity Present (06/28/2022)   Hunger Vital Sign    Worried About Running Out of Food in the Last Year: Sometimes true    Ran Out of Food in the Last Year: Sometimes true  Transportation Needs: No Transportation Needs (06/28/2022)   PRAPARE - Hydrologist (Medical): No    Lack of Transportation (Non-Medical): No  Physical Activity: Inactive (06/28/2022)   Exercise Vital Sign    Days of Exercise per Week: 0 days    Minutes of Exercise per Session: 0 min  Stress: No Stress Concern Present (06/28/2022)   [place]    Feeling of Stress : Not at all  Social Connections: Socially Isolated (06/28/2022)   Social Connection and Isolation Panel [NHANES]    Frequency of Communication with Friends and Family: Three times a week    Frequency of Social Gatherings with Friends and Family: Once a week    Attends Religious Services: Never    Marine scientist or Organizations: No    Attends Archivist Meetings: Never    Marital Status: Divorced  Human resources officer Violence: Not At Risk (06/28/2022)   Humiliation, Afraid, Rape, and  Kick questionnaire    Fear of Current or Ex-Partner: No    Emotionally Abused: No    Physically Abused: No    Sexually Abused: No    Review of Systems   General: Negative for anorexia, weight loss, fever, chills, fatigue, weakness. ENT: Negative for hoarseness, difficulty swallowing , nasal congestion. CV: Negative for chest pain, angina, palpitations, dyspnea on exertion, peripheral edema.  Respiratory: Negative for dyspnea at rest, dyspnea on exertion, cough, sputum, wheezing.  GI: See history of present illness. GU:  Negative for dysuria, hematuria, urinary incontinence, urinary frequency, nocturnal urination.  Endo: Negative for unusual weight change.     Physical Exam   LMP 05/25/2011    General: Well-nourished, well-developed in no acute distress.  Eyes: No icterus. Mouth: Oropharyngeal mucosa moist and pink , no lesions erythema or exudate. Lungs: Clear to auscultation bilaterally.  Heart: Regular rate and rhythm, no murmurs rubs or gallops.  Abdomen: Bowel sounds are normal, nontender, nondistended, no hepatosplenomegaly or masses,  no abdominal bruits or hernia , no rebound or guarding.  Rectal: ***  Extremities: No lower extremity edema. No clubbing or deformities. Neuro: Alert and oriented x 4   Skin: Warm and dry, no jaundice.   Psych: Alert and cooperative, normal mood and affect.  Labs   Lab Results  Component Value Date   CREATININE 0.94 08/14/2022   BUN 11 08/14/2022   NA 138 08/14/2022   K 3.6 08/14/2022   CL 94 (L) 08/14/2022   CO2 27 08/14/2022  Lab Results  Component Value Date   WBC 7.2 08/14/2022   HGB 13.6 08/14/2022   HCT 40.5 08/14/2022   MCV 86 08/14/2022   PLT 217 08/14/2022   Lab Results  Component Value Date   ALT 17 08/14/2022   AST 25 08/14/2022   ALKPHOS 105 08/14/2022   BILITOT 0.5 08/14/2022   Lab Results  Component Value Date   TSH 1.880 08/14/2022   Lab Results  Component Value Date   HGBA1C 5.5 08/14/2022     Imaging Studies   US Abdomen Limited RUQ (LIVER/GB)  Result Date: 08/22/2022 CLINICAL DATA:  Cirrhosis EXAM: ULTRASOUND ABDOMEN LIMITED RIGHT UPPER QUADRANT COMPARISON:  Chest CT 06/19/2022 FINDINGS: Gallbladder: Surgically absent Common bile duct: Diameter: 4.1 mm Liver: Coarsened echogenicity and nodular contour. No focal lesion. Portal vein is patent on color Doppler imaging with normal direction of blood flow towards the liver. Other: None. IMPRESSION: Cirrhotic morphology of the liver. No focal lesion. Electronically Signed   By: Lovey Newcomer M.D.   On: 08/22/2022 13:28    Assessment       PLAN   ***   Laureen Ochs. Bobby Rumpf, [place], [facility]

## 2022-09-05 ENCOUNTER — Ambulatory Visit (INDEPENDENT_AMBULATORY_CARE_PROVIDER_SITE_OTHER): Payer: Medicare Other | Admitting: Gastroenterology

## 2022-09-05 ENCOUNTER — Encounter: Payer: Self-pay | Admitting: Gastroenterology

## 2022-09-05 VITALS — BP 113/72 | HR 69 | Temp 97.8°F | Ht 64.0 in | Wt 191.6 lb

## 2022-09-05 DIAGNOSIS — K7460 Unspecified cirrhosis of liver: Secondary | ICD-10-CM | POA: Diagnosis not present

## 2022-09-05 NOTE — Patient Instructions (Signed)
Please complete labs. We will be in touch with results as available. Continue to strive towards healthier weight. Try to do light exercises/walking 20 minutes a day.

## 2022-09-07 LAB — CBC WITH DIFFERENTIAL/PLATELET
Basophils Absolute: 0.1 10*3/uL (ref 0.0–0.2)
Basos: 1 %
EOS (ABSOLUTE): 0.1 10*3/uL (ref 0.0–0.4)
Eos: 1 %
Hematocrit: 40.7 % (ref 34.0–46.6)
Hemoglobin: 14 g/dL (ref 11.1–15.9)
Immature Grans (Abs): 0.1 10*3/uL (ref 0.0–0.1)
Immature Granulocytes: 1 %
Lymphocytes Absolute: 3.1 10*3/uL (ref 0.7–3.1)
Lymphs: 33 %
MCH: 29.2 pg (ref 26.6–33.0)
MCHC: 34.4 g/dL (ref 31.5–35.7)
MCV: 85 fL (ref 79–97)
Monocytes Absolute: 0.8 10*3/uL (ref 0.1–0.9)
Monocytes: 8 %
Neutrophils Absolute: 5.3 10*3/uL (ref 1.4–7.0)
Neutrophils: 56 %
Platelets: 242 10*3/uL (ref 150–450)
RBC: 4.79 x10E6/uL (ref 3.77–5.28)
RDW: 13.5 % (ref 11.7–15.4)
WBC: 9.3 10*3/uL (ref 3.4–10.8)

## 2022-09-07 LAB — COMPREHENSIVE METABOLIC PANEL
ALT: 19 IU/L (ref 0–32)
AST: 25 IU/L (ref 0–40)
Albumin/Globulin Ratio: 1.5 (ref 1.2–2.2)
Albumin: 4.2 g/dL (ref 3.9–4.9)
Alkaline Phosphatase: 106 IU/L (ref 44–121)
BUN/Creatinine Ratio: 7 — ABNORMAL LOW (ref 12–28)
BUN: 6 mg/dL — ABNORMAL LOW (ref 8–27)
Bilirubin Total: 0.5 mg/dL (ref 0.0–1.2)
CO2: 29 mmol/L (ref 20–29)
Calcium: 9.6 mg/dL (ref 8.7–10.3)
Chloride: 93 mmol/L — ABNORMAL LOW (ref 96–106)
Creatinine, Ser: 0.84 mg/dL (ref 0.57–1.00)
Globulin, Total: 2.8 g/dL (ref 1.5–4.5)
Glucose: 90 mg/dL (ref 70–99)
Potassium: 3.6 mmol/L (ref 3.5–5.2)
Sodium: 133 mmol/L — ABNORMAL LOW (ref 134–144)
Total Protein: 7 g/dL (ref 6.0–8.5)
eGFR: 79 mL/min/{1.73_m2} (ref >59)

## 2022-09-07 LAB — IGG, IGA, IGM
IgA/Immunoglobulin A, Serum: 511 mg/dL — ABNORMAL HIGH (ref 87–352)
IgG (Immunoglobin G), Serum: 1043 mg/dL (ref 586–1602)
IgM (Immunoglobulin M), Srm: 106 mg/dL (ref 26–217)

## 2022-09-07 LAB — ANA: Anti Nuclear Antibody (ANA): NEGATIVE

## 2022-09-07 LAB — HEPATITIS B SURFACE ANTIGEN: Hepatitis B Surface Ag: NEGATIVE

## 2022-09-07 LAB — IRON,TIBC AND FERRITIN PANEL
Ferritin: 53 ng/mL (ref 15–150)
Iron Saturation: 30 % (ref 15–55)
Iron: 92 ug/dL (ref 27–139)
Total Iron Binding Capacity: 306 ug/dL (ref 250–450)
UIBC: 214 ug/dL (ref 118–369)

## 2022-09-07 LAB — AFP TUMOR MARKER: AFP, Serum, Tumor Marker: 5.4 ng/mL (ref 0.0–9.2)

## 2022-09-07 LAB — HEPATITIS C ANTIBODY: Hep C Virus Ab: NONREACTIVE

## 2022-09-07 LAB — MITOCHONDRIAL/SMOOTH MUSCLE AB PNL
Mitochondrial Ab: 37.2 Units — ABNORMAL HIGH (ref 0.0–20.0)
Smooth Muscle Ab: 7 Units (ref 0–19)

## 2022-09-07 LAB — HEPATITIS A ANTIBODY, TOTAL: hep A Total Ab: NEGATIVE

## 2022-09-07 LAB — PROTIME-INR
INR: 1 (ref 0.9–1.2)
Prothrombin Time: 10.4 s (ref 9.1–12.0)

## 2022-09-07 LAB — HEPATITIS B SURFACE ANTIBODY,QUALITATIVE: Hep B Surface Ab, Qual: REACTIVE

## 2022-09-07 LAB — TISSUE TRANSGLUTAMINASE, IGA: Transglutaminase IgA: 3 U/mL (ref 0–3)

## 2022-09-29 ENCOUNTER — Encounter: Payer: Self-pay | Admitting: Family Medicine

## 2022-10-17 ENCOUNTER — Other Ambulatory Visit: Payer: Self-pay | Admitting: *Deleted

## 2022-10-17 DIAGNOSIS — F4323 Adjustment disorder with mixed anxiety and depressed mood: Secondary | ICD-10-CM

## 2022-10-17 DIAGNOSIS — I10 Essential (primary) hypertension: Secondary | ICD-10-CM

## 2022-10-17 DIAGNOSIS — F331 Major depressive disorder, recurrent, moderate: Secondary | ICD-10-CM

## 2022-10-17 DIAGNOSIS — G8929 Other chronic pain: Secondary | ICD-10-CM

## 2022-10-17 DIAGNOSIS — E7800 Pure hypercholesterolemia, unspecified: Secondary | ICD-10-CM

## 2022-10-17 MED ORDER — LOSARTAN POTASSIUM 100 MG PO TABS
100.0000 mg | ORAL_TABLET | Freq: Every day | ORAL | 3 refills | Status: DC
Start: 2022-10-17 — End: 2023-06-12

## 2022-10-17 MED ORDER — BUSPIRONE HCL 15 MG PO TABS
ORAL_TABLET | ORAL | 3 refills | Status: DC
Start: 2022-10-17 — End: 2023-08-20

## 2022-10-17 MED ORDER — PANTOPRAZOLE SODIUM 20 MG PO TBEC: DELAYED_RELEASE_TABLET | ORAL | 3 refills | Status: DC

## 2022-10-17 MED ORDER — CARVEDILOL 6.25 MG PO TABS
6.2500 mg | ORAL_TABLET | Freq: Two times a day (BID) | ORAL | 3 refills | Status: DC
Start: 2022-10-17 — End: 2023-06-12

## 2022-10-17 MED ORDER — CITALOPRAM HYDROBROMIDE 40 MG PO TABS
40.0000 mg | ORAL_TABLET | Freq: Every day | ORAL | 3 refills | Status: DC
Start: 2022-10-17 — End: 2023-08-20

## 2022-10-17 MED ORDER — ROSUVASTATIN CALCIUM 5 MG PO TABS
5.0000 mg | ORAL_TABLET | Freq: Every day | ORAL | 3 refills | Status: DC
Start: 2022-10-17 — End: 2023-06-12

## 2022-10-17 NOTE — Telephone Encounter (Signed)
Rx was post dated back in March.  She should have rx on file.

## 2022-10-17 NOTE — Telephone Encounter (Signed)
[facility] is closing so pt is changing pharmacy to [facility] in pharmacy. Pt says we can send her Lyrica to [facility] in [place].

## 2022-10-17 NOTE — Telephone Encounter (Signed)
She's not due for refill until the end of month.  Last rx 08/04/2022 for #90 days.

## 2022-10-19 ENCOUNTER — Other Ambulatory Visit: Payer: Self-pay | Admitting: *Deleted

## 2022-10-19 DIAGNOSIS — G8929 Other chronic pain: Secondary | ICD-10-CM

## 2022-10-20 MED ORDER — HYDROCHLOROTHIAZIDE 25 MG PO TABS: 25.0000 mg | ORAL_TABLET | Freq: Every day | ORAL | 3 refills | Status: DC

## 2022-10-20 NOTE — Telephone Encounter (Signed)
Lyrica 300mg  was filled at last visit. Not sure why we are receiving request for 150mg .

## 2022-10-27 ENCOUNTER — Encounter: Payer: Self-pay | Admitting: Family Medicine

## 2022-10-27 DIAGNOSIS — G8929 Other chronic pain: Secondary | ICD-10-CM

## 2022-10-27 MED ORDER — PREGABALIN 300 MG PO CAPS
ORAL_CAPSULE | ORAL | 1 refills | Status: DC
Start: 2022-11-02 — End: 2023-03-28

## 2022-11-03 ENCOUNTER — Other Ambulatory Visit: Payer: Self-pay | Admitting: *Deleted

## 2022-11-03 DIAGNOSIS — G8929 Other chronic pain: Secondary | ICD-10-CM

## 2022-11-14 ENCOUNTER — Ambulatory Visit: Payer: Medicare Other | Admitting: Family Medicine

## 2022-11-29 ENCOUNTER — Encounter: Payer: Self-pay | Admitting: Adult Health

## 2022-11-29 ENCOUNTER — Other Ambulatory Visit (HOSPITAL_COMMUNITY)
Admission: RE | Admit: 2022-11-29 | Discharge: 2022-11-29 | Disposition: A | Discharge status: Home / Self Care | Payer: Medicare Other | Source: Ambulatory Visit | Attending: Adult Health | Admitting: Adult Health

## 2022-11-29 ENCOUNTER — Ambulatory Visit (INDEPENDENT_AMBULATORY_CARE_PROVIDER_SITE_OTHER): Payer: Medicare Other | Admitting: Adult Health

## 2022-11-29 VITALS — BP 115/67 | HR 73 | Ht 63.25 in | Wt 189.0 lb

## 2022-11-29 DIAGNOSIS — Z1151 Encounter for screening for human papillomavirus (HPV): Secondary | ICD-10-CM | POA: Diagnosis not present

## 2022-11-29 DIAGNOSIS — K649 Unspecified hemorrhoids: Secondary | ICD-10-CM | POA: Diagnosis not present

## 2022-11-29 DIAGNOSIS — Z01419 Encounter for gynecological examination (general) (routine) without abnormal findings: Secondary | ICD-10-CM | POA: Insufficient documentation

## 2022-11-29 DIAGNOSIS — Z1211 Encounter for screening for malignant neoplasm of colon: Secondary | ICD-10-CM

## 2022-11-29 DIAGNOSIS — R87810 Cervical high risk human papillomavirus (HPV) DNA test positive: Secondary | ICD-10-CM | POA: Diagnosis not present

## 2022-11-29 HISTORY — DX: Encounter for screening for malignant neoplasm of colon: Z12.11

## 2022-11-29 LAB — HEMOCCULT GUIAC POC 1CARD (OFFICE): Fecal Occult Blood, POC: NEGATIVE

## 2022-11-29 NOTE — Progress Notes (Signed)
Patient ID: Jamie Mcgee, female   DOB: 26-Feb-1961, 62 y.o.   MRN: [id number] History of Present Illness: Jamie Mcgee is a 62 year old white female, divorced, PM in for a well woman gyn exam and pap. 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  PCP is Dr Nadine Counts    Current Medications, Allergies, Past Medical History, Past Surgical History, Family History and Social History were reviewed in [facility] electronic medical record.     Review of Systems: Patient denies any headaches, hearing loss, fatigue, blurred vision, shortness of breath, chest pain, abdominal pain, problems with bowel movements,(had diarrhea last week) urination, or intercourse(not active). No joint pain or mood swings.     Physical Exam:BP 115/67 (BP Location: Right Arm, Patient Position: Sitting, Cuff Size: Normal)   Pulse 73   Ht 5' 3.25" (1.607 m)   Wt 189 lb (85.7 kg)   LMP 05/25/2011   BMI 33.22 kg/m   General:  Well developed, well nourished, no acute distress Skin:  Warm and dry Neck:  Midline trachea, normal thyroid, good ROM, no lymphadenopathy,no carotid bruits heard Lungs; Clear to auscultation bilaterally Breast:  No dominant palpable mass, retraction, or nipple discharge Cardiovascular: Regular rate and rhythm Abdomen:  Soft, non tender, no hepatosplenomegaly Pelvic:  External genitalia is normal in appearance, no lesions.  The vagina is pale. Urethra has no lesions or masses. The cervix is smooth, pap with HR HPV genotyping performed.  Uterus is felt to be normal size, shape, and contour.  No adnexal masses or tenderness noted.Bladder is non tender, no masses felt. Rectal: Good sphincter tone, no polyps, + hemorrhoids felt.  Hemoccult negative. Extremities/musculoskeletal:  No swelling or varicosities noted, no clubbing or cyanosis Psych:  No mood changes, alert and cooperative,seems happy AA is 0 Fall risk is low    11/29/2022    1:36 PM 08/14/2022    9:17 AM  06/28/2022   10:29 AM  Depression screen PHQ 2/9  Decreased Interest 1 0 2  Down, Depressed, Hopeless 1 1 2   PHQ - 2 Score 2 1 4   Altered sleeping 0 1 1  Tired, decreased energy 1 0 2  Change in appetite 1 3 2   Feeling bad or failure about yourself  0 0 2  Trouble concentrating 1 2 1   Moving slowly or fidgety/restless 0 0 2  Suicidal thoughts 0 0 1  PHQ-9 Score 5 7 15   Difficult doing work/chores  Not difficult at all     On meds    11/29/2022    1:36 PM 08/14/2022    9:17 AM 05/08/2022    2:24 PM 11/18/2021   12:45 PM  GAD 7 : Generalized Anxiety Score  Nervous, Anxious, on Edge 1 0 1 0  Control/stop worrying 1 0 2 0  Worry too much - different things 1 0 1 1  Trouble relaxing 1 1 2  0  Restless 1 1 0 1  Easily annoyed or irritable 0 0 2 0  Afraid - awful might happen 0 0 1 0  Total GAD 7 Score 5 2 9 2   Anxiety Difficulty  Not difficult at all Somewhat difficult     Upstream - 11/29/22 1336       Pregnancy Intention Screening   Does the patient want to become pregnant in the next year? No    Does the patient's partner want to become pregnant in the next year? No    Would the patient  like to discuss contraceptive options today? No      Contraception Wrap Up   Current Method Abstinence;Female Sterilization   PM   End Method Abstinence;Female Sterilization   PM   Contraception Counseling Provided No            Examination chaperoned by Federico Flake CMA   Impression and Plan: 1. Encounter for gynecological examination with Papanicolaou smear of cervix Pap sent Next pap follow up TBD Physical in 1 year Labs with PCP Colonoscopy per GI Mammogram was negative 01/30/22  - Cytology - PAP( [place])  2. Encounter for screening fecal occult blood testing Hemoccult was negative  - POCT occult blood stool  3. Cervical high risk HPV (human papillomavirus) test positive, normal cytology Pap sent   4. Hemorrhoids, unspecified hemorrhoid type Use preparation H or  Anusol and tucks

## 2022-12-04 LAB — CYTOLOGY - PAP
Comment: NEGATIVE
Comment: NEGATIVE
Comment: NEGATIVE
Diagnosis: UNDETERMINED — AB
HPV 16: NEGATIVE
HPV 18 / 45: NEGATIVE
High risk HPV: POSITIVE — AB

## 2022-12-06 ENCOUNTER — Encounter: Payer: Self-pay | Admitting: Adult Health

## 2022-12-06 DIAGNOSIS — R87610 Atypical squamous cells of undetermined significance on cytologic smear of cervix (ASC-US): Secondary | ICD-10-CM | POA: Insufficient documentation

## 2022-12-22 ENCOUNTER — Encounter: Payer: Self-pay | Admitting: Family Medicine

## 2022-12-22 NOTE — Telephone Encounter (Signed)
Please see the MyChart message reply(ies) for my assessment and plan.    This patient gave consent for this Medical Advice Message and is aware that it may result in a bill to their insurance company, as well as the possibility of receiving a bill for a co-payment or deductible. They are an established patient, but are not seeking medical advice exclusively about a problem treated during an in person or video visit in the last seven days. I did not recommend an in person or video visit within seven days of my reply.    I spent a total of 5 minutes cumulative time within 7 days through MyChart messaging.  Ashly Gottschalk, DO   

## 2023-01-16 ENCOUNTER — Other Ambulatory Visit (HOSPITAL_COMMUNITY): Payer: Self-pay | Admitting: Adult Health

## 2023-01-16 DIAGNOSIS — Z1231 Encounter for screening mammogram for malignant neoplasm of breast: Secondary | ICD-10-CM

## 2023-02-02 ENCOUNTER — Ambulatory Visit (HOSPITAL_COMMUNITY): Payer: Medicare Other

## 2023-02-08 ENCOUNTER — Ambulatory Visit (HOSPITAL_COMMUNITY)
Admission: RE | Admit: 2023-02-08 | Discharge: 2023-02-08 | Disposition: A | Discharge status: Home / Self Care | Payer: Medicare Other | Source: Ambulatory Visit | Attending: Adult Health | Admitting: Adult Health

## 2023-02-08 DIAGNOSIS — Z1231 Encounter for screening mammogram for malignant neoplasm of breast: Secondary | ICD-10-CM | POA: Diagnosis not present

## 2023-02-09 ENCOUNTER — Encounter: Payer: Self-pay | Admitting: Pulmonary Disease

## 2023-02-09 ENCOUNTER — Ambulatory Visit: Payer: Medicare Other | Admitting: Pulmonary Disease

## 2023-02-09 VITALS — BP 126/81 | HR 66 | Wt 191.4 lb

## 2023-02-09 DIAGNOSIS — J432 Centrilobular emphysema: Secondary | ICD-10-CM | POA: Diagnosis not present

## 2023-02-09 DIAGNOSIS — G4733 Obstructive sleep apnea (adult) (pediatric): Secondary | ICD-10-CM | POA: Diagnosis not present

## 2023-02-09 DIAGNOSIS — J431 Panlobular emphysema: Secondary | ICD-10-CM | POA: Diagnosis not present

## 2023-02-09 NOTE — Assessment & Plan Note (Signed)
Continue Breztri But really smoking cessation would be the most important intervention here and I again emphasized that. Continue annual low-dose CT scan for lung cancer screening

## 2023-02-09 NOTE — Patient Instructions (Addendum)
Trying to quit smoking! You have done this before and you can do it again !  You can donate your CPAP machine to  [facility] ' [phone]  Try to keep your weight around 180 pounds

## 2023-02-09 NOTE — Progress Notes (Signed)
   Subjective:    Patient ID: Jamie Mcgee, female    DOB: [birth year], 62 y.o.   MRN: [id number]  HPI  62 yo smoker for follow-up of gold II COPD and OSA -Unable to tolerate CPAP She smoked more than 60 pack years she quit multiple times, longest for 7 months using nicotine patch and vaping.  Chantix gave her nightmares Tried nicotrol inhaler  56-month follow-up visit She has gained some weight back -10 pounds.  She admits to binge eating.  She could not tolerate higher dose of Vyvanse and this has stopped  She is back to smoking 10 cigarettes daily.  She had been quit for 1 year.  Breathing is stable.  She is compliant with Breztri.  Does not need rescue albuterol much.  She is still unable to tolerate CPAP .  She tried nasal mask again.    Significant tests/ events reviewed   LDCT chest 06/2022 RADS 2, no new nodules LDCT chest 11/2020 paraseptal emphysema, right upper lobe 4 mm nodule, nodular liver   03/2021 PFTs show moderate restriction, ratio 75, FEV1 1.37/54%, FVC 56%, DLCO 11.5/56%   12/2017 PFTs ratio 73, FEV1 1.41/54%, FVC 57%, no bronchodilator response, TLC 103%, DLCO 80%.  09/2017 spirometry moderate restriction, ratio 76, FEV1 54%, FVC 55%   07/2017 spirometry moderate airway obstruction, ratio 65, FEV1 38%, FVC  46%   02/2021 HST mod  OSA with AHI 20/ hr  N PSG 01/2018-206 pounds-AHI 7/hour lowest desaturation 88%  Review of Systems neg for any significant sore throat, dysphagia, itching, sneezing, nasal congestion or excess/ purulent secretions, fever, chills, sweats, unintended wt loss, pleuritic or exertional cp, hempoptysis, orthopnea pnd or change in chronic leg swelling. Also denies presyncope, palpitations, heartburn, abdominal pain, nausea, vomiting, diarrhea or change in bowel or urinary habits, dysuria,hematuria, rash, arthralgias, visual complaints, headache, numbness weakness or ataxia.     Objective:   Physical Exam  Gen. Pleasant, obese, in no  distress ENT - no lesions, no post nasal drip Neck: No JVD, no thyromegaly, no carotid bruits Lungs: no use of accessory muscles, no dullness to percussion, decreased without rales or rhonchi  Cardiovascular: Rhythm regular, heart sounds  normal, no murmurs or gallops, no peripheral edema Musculoskeletal: No deformities, no cyanosis or clubbing , no tremors       Assessment & Plan:

## 2023-02-09 NOTE — Assessment & Plan Note (Signed)
Unable to tolerate CPAP. 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 Weight loss alone would be prescribed therapy, aim for weight of around 180 pounds

## 2023-03-25 ENCOUNTER — Encounter: Payer: Self-pay | Admitting: Family Medicine

## 2023-03-27 ENCOUNTER — Other Ambulatory Visit: Payer: Self-pay | Admitting: *Deleted

## 2023-03-27 DIAGNOSIS — K7460 Unspecified cirrhosis of liver: Secondary | ICD-10-CM

## 2023-03-28 ENCOUNTER — Telehealth (INDEPENDENT_AMBULATORY_CARE_PROVIDER_SITE_OTHER): Payer: Medicare Other | Admitting: Family Medicine

## 2023-03-28 ENCOUNTER — Encounter: Payer: Self-pay | Admitting: Family Medicine

## 2023-03-28 DIAGNOSIS — J329 Chronic sinusitis, unspecified: Secondary | ICD-10-CM

## 2023-03-28 DIAGNOSIS — M5441 Lumbago with sciatica, right side: Secondary | ICD-10-CM

## 2023-03-28 DIAGNOSIS — M5442 Lumbago with sciatica, left side: Secondary | ICD-10-CM | POA: Diagnosis not present

## 2023-03-28 DIAGNOSIS — G8929 Other chronic pain: Secondary | ICD-10-CM

## 2023-03-28 MED ORDER — FLUCONAZOLE 150 MG PO TABS: 150.0000 mg | ORAL_TABLET | Freq: Once | ORAL | 0 refills | Status: AC

## 2023-03-28 MED ORDER — FLUTICASONE PROPIONATE 50 MCG/ACT NA SUSP: 2.0000 | Freq: Every day | NASAL | 6 refills | Status: DC

## 2023-03-28 MED ORDER — AMOXICILLIN-POT CLAVULANATE 875-125 MG PO TABS: 1.0000 | ORAL_TABLET | Freq: Two times a day (BID) | ORAL | 0 refills | Status: AC

## 2023-03-28 MED ORDER — PREGABALIN 300 MG PO CAPS: ORAL_CAPSULE | ORAL | 1 refills | Status: DC

## 2023-03-28 NOTE — Progress Notes (Signed)
MyChart Video visit  Subjective: CC:Sinusitis PCP: Jamie Mcgee [name] is a 62 y.o. female. Patient provides verbal consent for consult held via video.  Due to COVID-19 pandemic this visit was conducted virtually. This visit type was conducted due to national recommendations for restrictions regarding the COVID-19 Pandemic (e.g. social distancing, sheltering in place) in an effort to limit this patient's exposure and mitigate transmission in our community. All issues noted in this document were discussed and addressed.  A physical exam was not performed with this format.   Location of patient: home Location of provider: [facility] Others present for call: none  1. Rhinosinusitis She reports being sick x3 weeks. It got worse last week. She reports alternating nasal congestion/ rhinorrhea. She reports fatigue.  She has been using allergy meds but it's not helping.  2.  Chronic back pain Will be running out of Lyrica prior to her next visit.  She reports stability of symptoms.  No excessive daytime sedation with the medications but has been more fatigued as above due to acute illness  ROS: Per HPI  Allergies  Allergen Reactions   Lipitor [Atorvastatin] Other (See Comments)    Muscle aches and nausea   Naproxen Nausea And Vomiting    Stomach pain   Past Medical History:  Diagnosis Date   Allergy    Anxiety    Arthritis    Binge eating disorder    Carpal tunnel syndrome on right    Cataract    Both lenses been replaced   Cervical stenosis of spine    C5-6   COPD (chronic obstructive pulmonary disease) (HCC)    Gold III   Depression    Emphysema of lung (HCC)    Encounter for screening fecal occult blood testing 11/29/2022   Fibromuscular dysplasia of renal artery (HCC) 2006   GERD (gastroesophageal reflux disease)    Headache    migraines 20 years ago   Hepatitis    type A in 1977   HNP (herniated nucleus pulposus), cervical    HPV (human papilloma  virus) infection    HTN (hypertension)    Neuropathy    Sleep apnea    Vitamin D deficiency     Current Outpatient Medications:    albuterol (VENTOLIN HFA) 108 (90 Base) MCG/ACT inhaler, Inhale 2 puffs into the lungs every 6 (six) hours as needed for wheezing or shortness of breath., Disp: 8 g, Rfl: 3   BREZTRI AEROSPHERE 160-9-4.8 MCG/ACT AERO, USE 2 INHALATIONS BY MOUTH TWICE DAILY, Disp: 32.1 g, Rfl: 3   busPIRone (BUSPAR) 15 MG tablet, TAKE ONE TABLET BY MOUTH TWICE DAILY FOR ANXIETY, Disp: 180 tablet, Rfl: 3   calcium carbonate (OS-CAL - DOSED IN MG OF ELEMENTAL CALCIUM) 1250 (500 Ca) MG tablet, Take 1 tablet by mouth., Disp: , Rfl:    CALCIUM-VITAMIN D PO, Take 1 tablet by mouth daily., Disp: , Rfl:    carvedilol (COREG) 6.25 MG tablet, Take 1 tablet (6.25 mg total) by mouth 2 (two) times daily with a meal., Disp: 180 tablet, Rfl: 3   Cholecalciferol (VITAMIN D-3) 125 MCG (5000 UT) TABS, Take by mouth., Disp: , Rfl:    citalopram (CELEXA) 40 MG tablet, Take 1 tablet (40 mg total) by mouth daily., Disp: 90 tablet, Rfl: 3   fexofenadine (ALLEGRA) 180 MG tablet, Take 180 mg by mouth daily as needed., Disp: , Rfl:    hydrochlorothiazide (HYDRODIURIL) 25 MG tablet, Take 1 tablet (25 mg total) by mouth daily.,  Disp: 90 tablet, Rfl: 3   lisdexamfetamine (VYVANSE) 40 MG capsule, Take 1 capsule (40 mg total) by mouth daily., Disp: 30 capsule, Rfl: 0   losartan (COZAAR) 100 MG tablet, Take 1 tablet (100 mg total) by mouth daily., Disp: 90 tablet, Rfl: 3   nitroGLYCERIN (NITROSTAT) 0.4 MG SL tablet, Place 1 tablet (0.4 mg total) under the tongue every 5 (five) minutes as needed., Disp: 25 tablet, Rfl: 3   pantoprazole (PROTONIX) 20 MG tablet, TAKE ONE TABLET BY MOUTH DAILY FOR ACID REFLUX, Disp: 90 tablet, Rfl: 3   pregabalin (LYRICA) 300 MG capsule, Take 300mg  each evening., Disp: 90 capsule, Rfl: 1   rosuvastatin (CRESTOR) 5 MG tablet, Take 1 tablet (5 mg total) by mouth at bedtime., Disp: 90  tablet, Rfl: 3   TURMERIC PO, Take by mouth., Disp: , Rfl:    vitamin B-12 (CYANOCOBALAMIN) 100 MCG tablet, Take 100 mcg by mouth daily., Disp: , Rfl:    vitamin E 180 MG (400 UNITS) capsule, Take 400 Units by mouth daily., Disp: , Rfl:   Gen: Nontoxic female no acute distress HEENT: No gross facial swelling or scleral injection Pulm: Normal work breathing on room air.  No appreciable wheezes or dyspnea with speech  Assessment/ Plan: 62 y.o. female   Rhinosinusitis - Plan: amoxicillin-clavulanate (AUGMENTIN) 875-125 MG tablet, fluconazole (DIFLUCAN) 150 MG tablet, fluticasone (FLONASE) 50 MCG/ACT nasal spray  Chronic bilateral low back pain with bilateral sciatica - Plan: pregabalin (LYRICA) 300 MG capsule  Given worsening of her symptoms and symptoms that are greater than 2 weeks long I will empirically treat her with antibiotics for presumed secondary bacterial infection.  Unfortunately, never did test for COVID to not sure if this is a post viral syndrome.  We discussed home care instructions and reasons for reevaluation.  She voiced good understanding I will follow-up in April for her physical, with fasting labs and repeat urine drug screen  The national narcotic database was reviewed and I have renewed her Lyrica.  Future order placed for December fill.  UDS and CSA are up-to-date and we will update this again at her next visit  Start time: 10:09a End time: 10:17a  Total time spent on patient care (including video visit/ documentation): 10 minutes  Jamie Borror Hulen Skains, [facility] [phone]

## 2023-04-04 ENCOUNTER — Ambulatory Visit: Payer: Medicare Other | Admitting: Gastroenterology

## 2023-04-05 ENCOUNTER — Other Ambulatory Visit: Payer: Self-pay | Admitting: Medical Genetics

## 2023-04-05 DIAGNOSIS — Z006 Encounter for examination for normal comparison and control in clinical research program: Secondary | ICD-10-CM

## 2023-04-09 ENCOUNTER — Other Ambulatory Visit (HOSPITAL_COMMUNITY): Payer: Self-pay | Attending: Medical Genetics

## 2023-04-12 ENCOUNTER — Ambulatory Visit (INDEPENDENT_AMBULATORY_CARE_PROVIDER_SITE_OTHER): Payer: Medicare Other | Admitting: Family Medicine

## 2023-04-12 VITALS — BP 129/84 | HR 97 | Temp 98.0°F | Ht 63.0 in | Wt 187.0 lb

## 2023-04-12 DIAGNOSIS — R519 Headache, unspecified: Secondary | ICD-10-CM

## 2023-04-12 DIAGNOSIS — R197 Diarrhea, unspecified: Secondary | ICD-10-CM | POA: Diagnosis not present

## 2023-04-12 NOTE — Progress Notes (Signed)
Subjective:  Patient ID: Jamie Mcgee, female    DOB: 1961/01/03, 62 y.o.   MRN: [id number]  Patient Care Team: Raliegh Ip, DO as PCP - General (Family Medicine) Wyline Mood Dorothe Pea, MD as PCP - Cardiology (Cardiology) Lanelle Bal, DO as Consulting Physician (Internal Medicine) Doreatha Massed, MD as Medical Oncologist (Hematology) Oliver Barre, MD as Consulting Physician (Orthopedic Surgery) Dr [facility], OD (Optometry)   Chief Complaint:  Diarrhea  HPI: Jamie Mcgee is a 62 y.o. female presenting on 04/12/2023 for Diarrhea Patient was seen for sinus infection on 03/28/23 and started Augmentin. States that diarrhea started 2 days after starting antibiotics and has not stopped. States that it has increased in wateriness, is yellow/green, with a worsening smell. States that she is going nonstop. States that she has had episodes of stool incontinence. States that diarrhea occurs 6-7 times per day. States that she goes to urinate and has stool as well. Endorses significant cramping. Denies blood or mucus in stool. Denies fever that she knows of. Reports headache as well. States that headache started before the sinus infection. She has tried advil, not helping. Taking it once per day. States that she has been sleeping the past two days due to the pain. She is sensitive to light and sound. States that she had migraines years ago and that this is different from previous migraines. States that she does not have nausea. States that she did throw up once but believes that was due to being hot. Endorses loss of appetite.   Relevant past medical, surgical, family, and social history reviewed and updated as indicated.  Allergies and medications reviewed and updated. Data reviewed: Chart in Epic.   Past Medical History:  Diagnosis Date   Allergy    Anxiety    Arthritis    Binge eating disorder    Carpal tunnel syndrome on right    Cataract    Both  lenses been replaced   Cervical stenosis of spine    C5-6   COPD (chronic obstructive pulmonary disease) (HCC)    Gold III   Depression    Emphysema of lung (HCC)    Encounter for screening fecal occult blood testing 11/29/2022   Fibromuscular dysplasia of renal artery (HCC) 2006   GERD (gastroesophageal reflux disease)    Headache    migraines 20 years ago   Hepatitis    type A in 1977   HNP (herniated nucleus pulposus), cervical    HPV (human papilloma virus) infection    HTN (hypertension)    Neuropathy    Sleep apnea    Vitamin D deficiency     Past Surgical History:  Procedure Laterality Date   ANTERIOR CERVICAL DECOMP/DISCECTOMY FUSION N/A 10/05/2017   Procedure: C5-6 ANTERIOR CERVICAL DECOMPRESSION/DISCECTOMY FUSION,  ALLOGRAFT, PLATE  (NECK FIRST AND CARPAL TUNNEL RELEASE SECOND);  Surgeon: Eldred Manges, MD;  Location: [facility] OR;  Service: Orthopedics;  Laterality: N/A;   APPENDECTOMY  1993   BREAST BIOPSY Right    CARPAL TUNNEL RELEASE Right 10/05/2017   Procedure: RIGHT CARPAL TUNNEL RELEASE;  Surgeon: Eldred Manges, MD;  Location: [facility] OR;  Service: Orthopedics;  Laterality: Right;   CATARACT EXTRACTION  08/2010   left    CATARACT EXTRACTION W/PHACO  06/01/2011   Procedure: CATARACT EXTRACTION PHACO AND INTRAOCULAR LENS PLACEMENT (IOC);  Surgeon: Gemma Payor;  Location: [facility] ORS;  Service: Ophthalmology;  Laterality: Right;  CDE:9.62   CHOLECYSTECTOMY  COLONOSCOPY WITH PROPOFOL N/A 07/06/2020   Procedure: COLONOSCOPY WITH PROPOFOL;  Surgeon: Lanelle Bal, DO;  Location: [facility] ENDOSCOPY;  Service: Endoscopy;  Laterality: N/A;  11:00am   EYE SURGERY     Cataract removal   RENAL ARTERY ANGIOPLASTY  2006   right   TUBAL LIGATION  1984   [facility]    Social History   Socioeconomic History   Marital status: Divorced    Spouse name: Not on file   Number of children: Not on file   Years of education: Not on file   Highest education level: GED or equivalent  Occupational  History   Not on file  Tobacco Use   Smoking status: Every Day    Current packs/day: 0.00    Average packs/day: 1 pack/day for 40.0 years (40.0 ttl pk-yrs)    Types: Cigarettes    Start date: 11/01/1981    Last attempt to quit: 11/01/2021    Years since quitting: 1.4   Smokeless tobacco: Never   Tobacco comments:    Quit 11/15/21  Vaping Use   Vaping status: Never Used  Substance and Sexual Activity   Alcohol use: No   Drug use: Yes    Frequency: 0.5 times per week    Types: Marijuana    Comment: daily   Sexual activity: Not Currently    Birth control/protection: Abstinence, Post-menopausal    Comment: tubal  Other Topics Concern   Not on file  Social History Narrative   The patient does not have any sex drive and has not had one for almost a year.   Her   husband thinks she does not love him anymore.  He does not seem to understand   her   depression.   Social Determinants of Health   Financial Resource Strain: Low Risk  (04/12/2023)   Overall Financial Resource Strain (CARDIA)    Difficulty of Paying Living Expenses: Not very hard  Food Insecurity: Food Insecurity Present (04/12/2023)   Hunger Vital Sign    Worried About Running Out of Food in the Last Year: Sometimes true    Ran Out of Food in the Last Year: Sometimes true  Transportation Needs: No Transportation Needs (04/12/2023)   PRAPARE - Administrator, Civil Service (Medical): No    Lack of Transportation (Non-Medical): No  Physical Activity: Insufficiently Active (04/12/2023)   Exercise Vital Sign    Days of Exercise per Week: 3 days    Minutes of Exercise per Session: 30 min  Stress: No Stress Concern Present (04/12/2023)   [employer] of Occupational Health - Occupational Stress Questionnaire    Feeling of Stress : Only a little  Social Connections: Unknown (04/12/2023)   Social Connection and Isolation Panel [NHANES]    Frequency of Communication with Friends and Family: Three times a week     Frequency of Social Gatherings with Friends and Family: Once a week    Attends Religious Services: Patient declined    Database administrator or Organizations: No    Attends Banker Meetings: Never    Marital Status: Divorced  Catering manager Violence: Not At Risk (11/29/2022)   Humiliation, Afraid, Rape, and Kick questionnaire    Fear of Current or Ex-Partner: No    Emotionally Abused: No    Physically Abused: No    Sexually Abused: No    Outpatient Encounter Medications as of 04/12/2023  Medication Sig   albuterol (VENTOLIN HFA) 108 (90 Base) MCG/ACT inhaler Inhale  2 puffs into the lungs every 6 (six) hours as needed for wheezing or shortness of breath.   BREZTRI AEROSPHERE 160-9-4.8 MCG/ACT AERO USE 2 INHALATIONS BY MOUTH TWICE DAILY   busPIRone (BUSPAR) 15 MG tablet TAKE ONE TABLET BY MOUTH TWICE DAILY FOR ANXIETY   calcium carbonate (OS-CAL - DOSED IN MG OF ELEMENTAL CALCIUM) 1250 (500 Ca) MG tablet Take 1 tablet by mouth.   CALCIUM-VITAMIN D PO Take 1 tablet by mouth daily.   carvedilol (COREG) 6.25 MG tablet Take 1 tablet (6.25 mg total) by mouth 2 (two) times daily with a meal.   Cholecalciferol (VITAMIN D-3) 125 MCG (5000 UT) TABS Take by mouth.   citalopram (CELEXA) 40 MG tablet Take 1 tablet (40 mg total) by mouth daily.   fexofenadine (ALLEGRA) 180 MG tablet Take 180 mg by mouth daily as needed.   fluticasone (FLONASE) 50 MCG/ACT nasal spray Place 2 sprays into both nostrils daily.   hydrochlorothiazide (HYDRODIURIL) 25 MG tablet Take 1 tablet (25 mg total) by mouth daily.   losartan (COZAAR) 100 MG tablet Take 1 tablet (100 mg total) by mouth daily.   nitroGLYCERIN (NITROSTAT) 0.4 MG SL tablet Place 1 tablet (0.4 mg total) under the tongue every 5 (five) minutes as needed.   pantoprazole (PROTONIX) 20 MG tablet TAKE ONE TABLET BY MOUTH DAILY FOR ACID REFLUX   [START ON 05/14/2023] pregabalin (LYRICA) 300 MG capsule Take 300mg  each evening.   rosuvastatin  (CRESTOR) 5 MG tablet Take 1 tablet (5 mg total) by mouth at bedtime.   TURMERIC PO Take by mouth.   vitamin B-12 (CYANOCOBALAMIN) 100 MCG tablet Take 100 mcg by mouth daily.   vitamin E 180 MG (400 UNITS) capsule Take 400 Units by mouth daily.   No facility-administered encounter medications on file as of 04/12/2023.    Allergies  Allergen Reactions   Lipitor [Atorvastatin] Other (See Comments)    Muscle aches and nausea   Naproxen Nausea And Vomiting    Stomach pain    Review of Systems As per HPI  Objective:  BP 129/84   Pulse 97   Temp 98 F (36.7 C)   Ht 5\' 3"  (1.6 m)   Wt 187 lb (84.8 kg)   LMP 05/25/2011   SpO2 96%   BMI 33.13 kg/m    Wt Readings from Last 3 Encounters:  04/12/23 187 lb (84.8 kg)  02/09/23 191 lb 6.4 oz (86.8 kg)  11/29/22 189 lb (85.7 kg)    Physical Exam Constitutional:      General: She is awake. She is not in acute distress.    Appearance: Normal appearance. She is well-developed and well-groomed. She is ill-appearing. She is not toxic-appearing or diaphoretic.  Cardiovascular:     Rate and Rhythm: Normal rate and regular rhythm.     Pulses: Normal pulses.          Radial pulses are 2+ on the right side and 2+ on the left side.       Posterior tibial pulses are 2+ on the right side and 2+ on the left side.     Heart sounds: Normal heart sounds. No murmur heard.    No gallop.  Pulmonary:     Effort: Pulmonary effort is normal. No respiratory distress.     Breath sounds: Normal breath sounds. No stridor. No wheezing, rhonchi or rales.  Abdominal:     General: Bowel sounds are normal. There is no distension.     Tenderness: There is no  abdominal tenderness.     Hernia: No hernia is present.     Comments: Abd slightly firm   Musculoskeletal:     Cervical back: Full passive range of motion without pain and neck supple.     Right lower leg: No edema.     Left lower leg: No edema.  Skin:    General: Skin is warm.     Capillary Refill:  Capillary refill takes less than 2 seconds.  Neurological:     General: No focal deficit present.     Mental Status: She is alert, oriented to person, place, and time and easily aroused. Mental status is at baseline.     GCS: GCS eye subscore is 4. GCS verbal subscore is 5. GCS motor subscore is 6.     Motor: No weakness.  Psychiatric:        Attention and Perception: Attention and perception normal.        Mood and Affect: Mood and affect normal.        Speech: Speech normal.        Behavior: Behavior normal. Behavior is cooperative.        Thought Content: Thought content normal. Thought content does not include homicidal or suicidal ideation. Thought content does not include homicidal or suicidal plan.        Cognition and Memory: Cognition and memory normal.        Judgment: Judgment normal.     Results for orders placed or performed in visit on 11/29/22  POCT occult blood stool  Result Value Ref Range   Fecal Occult Blood, POC Negative Negative   Card #1 Date     Card #2 Fecal Occult Blod, POC     Card #2 Date     Card #3 Fecal Occult Blood, POC     Card #3 Date    Cytology - PAP( [facility])  Result Value Ref Range   High risk HPV Positive (A)    HPV 16 Negative    HPV 18 / 45 Negative    Adequacy      Satisfactory for evaluation; transformation zone component PRESENT.   Diagnosis (A)     - Atypical squamous cells of undetermined significance (ASC-US)   Microorganisms      Fungal organisms present consistent with Candida spp.   Comment Normal Reference Range HPV - Negative    Comment Normal Reference Range HPV 16- Negative    Comment Normal Reference Range HPV 16 18 45 -Negative        04/12/2023    3:25 PM 11/29/2022    1:36 PM 08/14/2022    9:17 AM 06/28/2022   10:29 AM 05/08/2022    2:24 PM  Depression screen PHQ 2/9  Decreased Interest 0 1 0 2 2  Down, Depressed, Hopeless 0 1 1 2 2   PHQ - 2 Score 0 2 1 4 4   Altered sleeping 0 0 1 1 1   Tired, decreased  energy 2 1 0 2 2  Change in appetite 0 1 3 2 2   Feeling bad or failure about yourself  0 0 0 2 2  Trouble concentrating 0 1 2 1 1   Moving slowly or fidgety/restless 0 0 0 2 2  Suicidal thoughts 0 0 0 1 1  PHQ-9 Score 2 5 7 15 15   Difficult doing work/chores Not difficult at all  Not difficult at all         04/12/2023    3:26 PM 11/29/2022  1:36 PM 08/14/2022    9:17 AM 05/08/2022    2:24 PM  GAD 7 : Generalized Anxiety Score  Nervous, Anxious, on Edge 0 1 0 1  Control/stop worrying 0 1 0 2  Worry too much - different things 0 1 0 1  Trouble relaxing 0 1 1 2   Restless 0 1 1 0  Easily annoyed or irritable 0 0 0 2  Afraid - awful might happen 0 0 0 1  Total GAD 7 Score 0 5 2 9   Anxiety Difficulty Not difficult at all  Not difficult at all Somewhat difficult   Pertinent labs & imaging results that were available during my care of the patient were reviewed by me and considered in my medical decision making.  Assessment & Plan:  Janeth "Eunice Blase" was seen today for diarrhea.  Diagnoses and all orders for this visit:  Diarrhea, unspecified type Labs as below. Will communicate results to patient once available. Will await results to determine next steps.  Discussed with patient maintaining adequate hydration.  -     Cdiff NAA+O+P+Stool Culture -     CBC with Differential/Platelet -     CMP14+EGFR  Acute nonintractable headache, unspecified headache type Discussed with patient that likely due to dehydration. Encouraged adequate hydration. Will await lab results to determine next steps. Can consider toradol injection.   Continue all other maintenance medications.  Follow up plan: Return for pending labs .  Continue healthy lifestyle choices, including diet (rich in fruits, vegetables, and lean proteins, and low in salt and simple carbohydrates) and exercise (at least 30 minutes of moderate physical activity daily).  Written and verbal instructions provided   The above  assessment and management plan was discussed with the patient. The patient verbalized understanding of and has agreed to the management plan. Patient is aware to call the clinic if they develop any new symptoms or if symptoms persist or worsen. Patient is aware when to return to the clinic for a follow-up visit. Patient educated on when it is appropriate to go to the emergency department.   Neale Burly, DNP-FNP [facility] [address] [place], [place] [address] [phone]

## 2023-04-13 LAB — CMP14+EGFR
ALT: 21 [IU]/L (ref 0–32)
AST: 30 [IU]/L (ref 0–40)
Albumin: 4.3 g/dL (ref 3.9–4.9)
Alkaline Phosphatase: 129 [IU]/L — ABNORMAL HIGH (ref 44–121)
BUN/Creatinine Ratio: 10 — ABNORMAL LOW (ref 12–28)
BUN: 8 mg/dL (ref 8–27)
Bilirubin Total: 0.6 mg/dL (ref 0.0–1.2)
CO2: 27 mmol/L (ref 20–29)
Calcium: 9.4 mg/dL (ref 8.7–10.3)
Chloride: 90 mmol/L — ABNORMAL LOW (ref 96–106)
Creatinine, Ser: 0.8 mg/dL (ref 0.57–1.00)
Globulin, Total: 2.9 g/dL (ref 1.5–4.5)
Glucose: 125 mg/dL — ABNORMAL HIGH (ref 70–99)
Potassium: 3.8 mmol/L (ref 3.5–5.2)
Sodium: 132 mmol/L — ABNORMAL LOW (ref 134–144)
Total Protein: 7.2 g/dL (ref 6.0–8.5)
eGFR: 83 mL/min/{1.73_m2} (ref >59)

## 2023-04-13 LAB — CBC WITH DIFFERENTIAL/PLATELET
Basophils Absolute: 0 10*3/uL (ref 0.0–0.2)
Basos: 0 %
EOS (ABSOLUTE): 0.1 10*3/uL (ref 0.0–0.4)
Eos: 1 %
Hematocrit: 49.3 % — ABNORMAL HIGH (ref 34.0–46.6)
Hemoglobin: 16.6 g/dL — ABNORMAL HIGH (ref 11.1–15.9)
Immature Grans (Abs): 0.1 10*3/uL (ref 0.0–0.1)
Immature Granulocytes: 1 %
Lymphocytes Absolute: 2.4 10*3/uL (ref 0.7–3.1)
Lymphs: 23 %
MCH: 29.3 pg (ref 26.6–33.0)
MCHC: 33.7 g/dL (ref 31.5–35.7)
MCV: 87 fL (ref 79–97)
Monocytes Absolute: 1 10*3/uL — ABNORMAL HIGH (ref 0.1–0.9)
Monocytes: 10 %
Neutrophils Absolute: 6.6 10*3/uL (ref 1.4–7.0)
Neutrophils: 65 %
Platelets: 229 10*3/uL (ref 150–450)
RBC: 5.66 x10E6/uL — ABNORMAL HIGH (ref 3.77–5.28)
RDW: 13.7 % (ref 11.7–15.4)
WBC: 10.1 10*3/uL (ref 3.4–10.8)

## 2023-04-17 ENCOUNTER — Other Ambulatory Visit: Payer: Medicare Other

## 2023-04-17 ENCOUNTER — Encounter: Payer: Self-pay | Admitting: Gastroenterology

## 2023-04-17 ENCOUNTER — Ambulatory Visit: Payer: Medicare Other | Admitting: Gastroenterology

## 2023-04-17 ENCOUNTER — Telehealth: Payer: Self-pay | Admitting: Family Medicine

## 2023-04-17 VITALS — BP 121/81 | HR 66 | Temp 98.1°F | Ht 63.0 in | Wt 191.4 lb

## 2023-04-17 DIAGNOSIS — R197 Diarrhea, unspecified: Secondary | ICD-10-CM

## 2023-04-17 DIAGNOSIS — R748 Abnormal levels of other serum enzymes: Secondary | ICD-10-CM | POA: Diagnosis not present

## 2023-04-17 DIAGNOSIS — K7460 Unspecified cirrhosis of liver: Secondary | ICD-10-CM | POA: Diagnosis not present

## 2023-04-17 DIAGNOSIS — K648 Other hemorrhoids: Secondary | ICD-10-CM | POA: Diagnosis not present

## 2023-04-17 DIAGNOSIS — R1010 Upper abdominal pain, unspecified: Secondary | ICD-10-CM | POA: Diagnosis not present

## 2023-04-17 NOTE — Progress Notes (Signed)
GI Office Note    Referring Provider: Raliegh Ip, DO Primary Care Physician:  Raliegh Ip, DO  Primary Gastroenterologist: [name], DO   Chief Complaint   Chief Complaint  Patient presents with   Follow-up    Having issues with abdominal pain. Did have issues with diarrhea a couple weeks ago but has since resolved.     History of Present Illness   Jamie Mcgee is a 62 y.o. female presenting today for follow-up of cirrhosis.  Last seen in April 2024. Found to have nodular liver margins on CT lung cancer screening 06/2022. RUQ U/S completed in 08/2022 with findings c/w cirrhosis.   Labs showing immunity to Hep B. Hep A total Ab negative, patient previously reported having Hep A at age 42, hospitalized and quite sick. HCV Ab negative. Screen for celiac negative. AMA positive. ANA neg. IgG and IgM normal. IgA 511. LFTs normal.    Patient states several weeks ago she developed a sinus infection requiring antibiotics.  A couple of days into taking an antibiotic, she developed diarrhea which persisted for a week after she completed antibiotics.  For 3 days during this timeframe she slept and only got up to have a bowel movement.  Was not eating.  Saw PCP, stool studies are pending.  Patient to drop off stools today.  Patient states her stools are somewhat improved now, 3 to 4-day, baseline 1-2.  Stools are mushy.  No melena or rectal bleeding.  Continues to have achy/crampy abdominal pain all day long.  The only relief she gets is for 15 minutes after eating a popsicle.  Continues to eat bland foods such as baked potatoes.  Denies any melena.  She has had some bright red blood which she contributes to hemorrhoids.  She has a history of internal hemorrhoids although does feel like hemorrhoid noted externally.  She is interested in hemorrhoid banding.  Has problems off and on and would like to have this addressed.   Most recent labs from April 12, 2023: Total  bilirubin 0.6, alkaline phosphatase 129, previously normal, AST 30, ALT 21, hemoglobin 16.6, platelets 229,000.  Ruq u/s 08/2022: IMPRESSION: Cirrhotic morphology of the liver. No focal lesion.   No prior EGD.   Colonoscopy 07/2020: -non-bleeding internal hemorrhoids -next colonoscopy 10 years   Medications   Current Outpatient Medications  Medication Sig Dispense Refill   albuterol (VENTOLIN HFA) 108 (90 Base) MCG/ACT inhaler Inhale 2 puffs into the lungs every 6 (six) hours as needed for wheezing or shortness of breath. 8 g 3   BREZTRI AEROSPHERE 160-9-4.8 MCG/ACT AERO USE 2 INHALATIONS BY MOUTH TWICE DAILY 32.1 g 3   busPIRone (BUSPAR) 15 MG tablet TAKE ONE TABLET BY MOUTH TWICE DAILY FOR ANXIETY 180 tablet 3   calcium carbonate (OS-CAL - DOSED IN MG OF ELEMENTAL CALCIUM) 1250 (500 Ca) MG tablet Take 1 tablet by mouth.     CALCIUM-VITAMIN D PO Take 1 tablet by mouth daily.     carvedilol (COREG) 6.25 MG tablet Take 1 tablet (6.25 mg total) by mouth 2 (two) times daily with a meal. 180 tablet 3   Cholecalciferol (VITAMIN D-3) 125 MCG (5000 UT) TABS Take by mouth.     citalopram (CELEXA) 40 MG tablet Take 1 tablet (40 mg total) by mouth daily. 90 tablet 3   fexofenadine (ALLEGRA) 180 MG tablet Take 180 mg by mouth daily as needed.     fluticasone (FLONASE) 50 MCG/ACT nasal spray  Place 2 sprays into both nostrils daily. 16 g 6   hydrochlorothiazide (HYDRODIURIL) 25 MG tablet Take 1 tablet (25 mg total) by mouth daily. 90 tablet 3   losartan (COZAAR) 100 MG tablet Take 1 tablet (100 mg total) by mouth daily. 90 tablet 3   nitroGLYCERIN (NITROSTAT) 0.4 MG SL tablet Place 1 tablet (0.4 mg total) under the tongue every 5 (five) minutes as needed. 25 tablet 3   pantoprazole (PROTONIX) 20 MG tablet TAKE ONE TABLET BY MOUTH DAILY FOR ACID REFLUX 90 tablet 3   [START ON 05/14/2023] pregabalin (LYRICA) 300 MG capsule Take 300mg  each evening. 90 capsule 1   rosuvastatin (CRESTOR) 5 MG tablet Take  1 tablet (5 mg total) by mouth at bedtime. 90 tablet 3   No current facility-administered medications for this visit.    Allergies   Allergies as of 04/17/2023 - Review Complete 04/17/2023  Allergen Reaction Noted   Lipitor [atorvastatin] Other (See Comments) 10/13/2014   Naproxen Nausea And Vomiting 06/04/2015    Review of Systems   General: Negative for  weight loss, fever, chills, fatigue Some improvement in appetite, weakness. ENT: Negative for hoarseness, difficulty swallowing , nasal congestion. CV: Negative for chest pain, angina, palpitations, dyspnea on exertion, peripheral edema.  Respiratory: Negative for dyspnea at rest, dyspnea on exertion, cough, sputum, wheezing.  GI: See history of present illness. GU:  Negative for dysuria, hematuria, urinary incontinence, urinary frequency, nocturnal urination.  Endo: Negative for unusual weight change.     Physical Exam   BP 121/81 (BP Location: Right Arm, Patient Position: Sitting, Cuff Size: Large)   Pulse 66   Temp 98.1 F (36.7 C) (Oral)   Ht 5\' 3"  (1.6 m)   Wt 191 lb 6.4 oz (86.8 kg)   LMP 05/25/2011   SpO2 99%   BMI 33.90 kg/m    General: Well-nourished, well-developed in no acute distress.  Eyes: No icterus. Mouth: Oropharyngeal mucosa moist and pink  Lungs: Scattered exp wheezing Heart: Regular rate and rhythm, no murmurs rubs or gallops.  Abdomen: Bowel sounds are normal, nondistended, no hepatosplenomegaly or masses,  no abdominal bruits or hernia , no rebound or guarding. Mild epig tenderness Rectal: not performed  Extremities: No lower extremity edema. No clubbing or deformities. Neuro: Alert and oriented x 4   Skin: Warm and dry, no jaundice.   Psych: Alert and cooperative, normal mood and affect.  Labs   See hpi  Imaging Studies   No results found.  Assessment/Plan:   Cirrhosis: ?etiology. ?MASH +/- PBC with positive AMA. LFTs had been normal up until recently with an alkaline phosphatase in  the 120s -Diagnosed earlier this year, has been well compensated. -No indication of portal hypertension, no, thrombocytopenia. -Due for updated labs, hepatoma screening.  Await lab results prior to scheduling imaging.  If ongoing abdominal pain may consider CT imaging as opposed to abdominal ultrasound  Abdominal pain: -Upper abdominal pain, recent onset, associated with diarrhea.  Interestingly symptoms improved with eating a popsicle.  Cannot rule out gastritis, reflux etc. -Increase pantoprazole to 40 mg twice daily for the next 2 weeks to see if this helps her pain.  Diarrhea: -Associated with recent antibiotic use, possibly antibiotic associated but C. difficile remains a possibility as well. -Follow-up pending stool studies  Hemorrhoids: -History of internal hemorrhoids noted on 2022 colonoscopy, patient desires definitive management via banding -Return office visit with Lewie Loron, NP for banding   Leanna Battles. Melvyn Neth, MHS, PA-C [facility]

## 2023-04-17 NOTE — Telephone Encounter (Signed)
Copied from CRM [id number]. Topic: Clinical - Request for Lab/Test Order >> Apr 17, 2023  3:31 PM Tiffany H wrote: Reason for CRM: Cassie with [facility] called to request for Stool Sample order to be released for this patient. Please assist.   Phone: [phone]

## 2023-04-17 NOTE — Patient Instructions (Addendum)
Increase pantoprazole to 40mg  before breakfast and evening meal for next two weeks to see if this helps with your abdominal pain. I will follow up stool results and labs as available. Further recommendations to follow based on results and if your abdominal pain does not resolve. Return for possible hemorrhoid banding with Lewie Loron, NP.

## 2023-04-17 NOTE — Telephone Encounter (Signed)
Lab order replaced

## 2023-04-20 NOTE — Progress Notes (Signed)
Still waiting for completed results, but preliminary results show salmonella. Recommend patient maintain hydration, at least 80-100 oz of liquids per day. Will send in bentyl for cramping. If patient starts experiencing fever, fatigue, or is not improving, please follow up.

## 2023-04-21 LAB — CDIFF NAA+O+P+STOOL CULTURE
E coli, Shiga toxin Assay: NEGATIVE
Toxigenic C. Difficile by PCR: NEGATIVE

## 2023-04-23 ENCOUNTER — Other Ambulatory Visit: Payer: Self-pay | Admitting: Nurse Practitioner

## 2023-04-23 ENCOUNTER — Encounter: Payer: Self-pay | Admitting: Family Medicine

## 2023-04-23 ENCOUNTER — Other Ambulatory Visit: Payer: Self-pay

## 2023-04-23 DIAGNOSIS — R109 Unspecified abdominal pain: Secondary | ICD-10-CM

## 2023-04-23 MED ORDER — DICYCLOMINE HCL 10 MG PO CAPS: 10.0000 mg | ORAL_CAPSULE | Freq: Three times a day (TID) | ORAL | 1 refills | Status: DC

## 2023-04-24 ENCOUNTER — Encounter: Payer: Self-pay | Admitting: Orthopedic Surgery

## 2023-04-24 ENCOUNTER — Ambulatory Visit: Payer: Medicare Other | Admitting: Orthopedic Surgery

## 2023-04-24 DIAGNOSIS — G8929 Other chronic pain: Secondary | ICD-10-CM | POA: Diagnosis not present

## 2023-04-24 DIAGNOSIS — M25512 Pain in left shoulder: Secondary | ICD-10-CM | POA: Diagnosis not present

## 2023-04-24 NOTE — Progress Notes (Signed)
Slightly elevated CBC counts, most likely due to smoking history. Will continue to monitor.

## 2023-04-24 NOTE — Patient Instructions (Signed)

## 2023-04-24 NOTE — Progress Notes (Signed)
Please let patient know that Dois Davenport, covering, sent in bentyl last night.

## 2023-04-25 ENCOUNTER — Encounter: Payer: Self-pay | Admitting: Orthopedic Surgery

## 2023-04-25 DIAGNOSIS — G8929 Other chronic pain: Secondary | ICD-10-CM

## 2023-04-25 NOTE — Progress Notes (Signed)
Return patient Visit  Assessment: Jamie Mcgee is a 62 y.o. female with the following: 1. Chronic left shoulder pain  Plan: KIYANNA DELMEDICO has recurrence of her left shoulder pain prior injection was very effective, she would like to proceed with another injection in clinic.  This was completed today.  There were no issues.  She will follow-up as needed.  Procedure note injection Left shoulder    Verbal consent was obtained to inject the left shoulder, subacromial space Timeout was completed to confirm the site of injection.  The skin was prepped with alcohol and ethyl chloride was sprayed at the injection site.  A 21-gauge needle was used to inject 40 mg of Depo-Medrol and 1% lidocaine (3 cc) into the subacromial space of the left shoulder using a posterolateral approach.  There were no complications. A sterile bandage was applied.   Follow-up: Return if symptoms worsen or fail to improve.  Subjective:  Chief Complaint  Patient presents with   Follow-up    Recheck on left shoulder.  NDC: [address]-6    History of Present Illness: Jamie Mcgee is a 62 y.o. female who returns for repeat evaluation of left shoulder pain.  I saw her in clinic at the beginning of the year for the same complaint.  At that visit, we injected the left shoulder.  She did very well following the injection.  However, over the past couple months she started to have progressive return of the pain.  She has not noticed worsening range of motion.  That is just painful.  It is affecting her sleep.  Review of Systems: No fevers or chills No numbness or tingling No chest pain No shortness of breath No bowel or bladder dysfunction No GI distress No headaches   LMP 05/25/2011   Physical Exam:  General: Alert and oriented. and No acute distress. Gait: Normal gait.  Evaluation of the left shoulder demonstrates no deformity.  No atrophy is appreciated.  Tenderness palpation of the posterior  lateral aspect of the left shoulder.  She has near full range of motion.  She does have good strength, but some pain is appreciated with strength testing.  No numbness or tingling distally.  Sensation is intact.  2+ radial pulse.  IMAGING: No new imaging obtained today   New Medications:  No orders of the defined types were placed in this encounter.     Oliver Barre, MD  04/25/2023 2:37 PM

## 2023-04-26 ENCOUNTER — Encounter: Payer: Medicare Other | Admitting: Gastroenterology

## 2023-05-02 LAB — COMPREHENSIVE METABOLIC PANEL
ALT: 19 [IU]/L (ref 0–32)
AST: 24 [IU]/L (ref 0–40)
Albumin: 4 g/dL (ref 3.9–4.9)
Alkaline Phosphatase: 106 [IU]/L (ref 44–121)
BUN/Creatinine Ratio: 7 — ABNORMAL LOW (ref 12–28)
BUN: 6 mg/dL — ABNORMAL LOW (ref 8–27)
Bilirubin Total: 0.7 mg/dL (ref 0.0–1.2)
CO2: 30 mmol/L — ABNORMAL HIGH (ref 20–29)
Calcium: 9.6 mg/dL (ref 8.7–10.3)
Chloride: 91 mmol/L — ABNORMAL LOW (ref 96–106)
Creatinine, Ser: 0.85 mg/dL (ref 0.57–1.00)
Globulin, Total: 3.1 g/dL (ref 1.5–4.5)
Glucose: 103 mg/dL — ABNORMAL HIGH (ref 70–99)
Potassium: 3.9 mmol/L (ref 3.5–5.2)
Sodium: 134 mmol/L (ref 134–144)
Total Protein: 7.1 g/dL (ref 6.0–8.5)
eGFR: 77 mL/min/{1.73_m2} (ref >59)

## 2023-05-02 LAB — PRIMARY BILIARY CHOLANGITIS PR
Anti-GP-210 Ab (RDL): <20 U (ref <20)
Anti-Mitochondrial Ab by IFA: <1:20 {titer}
Anti-Mitochondrial M2 Ab (RDL): 67 U — ABNORMAL HIGH (ref <20)
Anti-Nuclear Ab by IFA (RDL): POSITIVE — AB
Anti-SP-100 Ab (RDL): <20 U (ref <20)
Anti-Smooth Muscle Ab by IFA: <1:20 {titer}

## 2023-05-02 LAB — CBC WITH DIFFERENTIAL/PLATELET
Basophils Absolute: 0.1 10*3/uL (ref 0.0–0.2)
Basos: 1 %
EOS (ABSOLUTE): 0.1 10*3/uL (ref 0.0–0.4)
Eos: 1 %
Hematocrit: 45.1 % (ref 34.0–46.6)
Hemoglobin: 14.8 g/dL (ref 11.1–15.9)
Immature Grans (Abs): 0.1 10*3/uL (ref 0.0–0.1)
Immature Granulocytes: 1 %
Lymphocytes Absolute: 3.1 10*3/uL (ref 0.7–3.1)
Lymphs: 31 %
MCH: 30 pg (ref 26.6–33.0)
MCHC: 32.8 g/dL (ref 31.5–35.7)
MCV: 91 fL (ref 79–97)
Monocytes Absolute: 0.6 10*3/uL (ref 0.1–0.9)
Monocytes: 6 %
Neutrophils Absolute: 6 10*3/uL (ref 1.4–7.0)
Neutrophils: 60 %
Platelets: 252 10*3/uL (ref 150–450)
RBC: 4.94 x10E6/uL (ref 3.77–5.28)
RDW: 14 % (ref 11.7–15.4)
WBC: 9.9 10*3/uL (ref 3.4–10.8)

## 2023-05-02 LAB — PROTIME-INR
INR: 1 (ref 0.9–1.2)
Prothrombin Time: 11.3 s (ref 9.1–12.0)

## 2023-05-02 LAB — AFP TUMOR MARKER: AFP, Serum, Tumor Marker: 4.2 ng/mL (ref 0.0–9.2)

## 2023-05-02 LAB — ANA TITER AND PATTERN: Speckled Pattern: 1:160 {titer} — ABNORMAL HIGH

## 2023-05-02 LAB — STATE LABORATORY REPORT

## 2023-05-08 NOTE — Progress Notes (Signed)
Confirmed from [facility] Salmonella Berta infection. Patient should have received Bentyl. If she has not or is still having symptoms, please follow up.

## 2023-05-14 NOTE — Telephone Encounter (Signed)
Please refer to rheumatology for positive ANA and joint pain.  Please schedule ruq u/s for hepatoma screening.   Please schedule return ov in May 2025.

## 2023-05-15 ENCOUNTER — Other Ambulatory Visit: Payer: Self-pay | Admitting: *Deleted

## 2023-05-15 DIAGNOSIS — R7689 Other specified abnormal immunological findings in serum: Secondary | ICD-10-CM

## 2023-05-15 DIAGNOSIS — R768 Other specified abnormal immunological findings in serum: Secondary | ICD-10-CM

## 2023-05-15 DIAGNOSIS — M2550 Pain in unspecified joint: Secondary | ICD-10-CM

## 2023-05-15 DIAGNOSIS — K7460 Unspecified cirrhosis of liver: Secondary | ICD-10-CM

## 2023-05-15 NOTE — Addendum Note (Signed)
Addended by: Elinor Dodge on: 05/15/2023 12:51 PM   Modules accepted: Orders

## 2023-05-15 NOTE — Telephone Encounter (Signed)
Spoke with pt and MRI ordered, steps discussed with pt on steps that will be taken to schedule. Pt verbalized understanding.

## 2023-05-22 ENCOUNTER — Ambulatory Visit (HOSPITAL_COMMUNITY)
Admission: RE | Admit: 2023-05-22 | Discharge: 2023-05-22 | Disposition: A | Discharge status: Home / Self Care | Payer: Medicare Other | Source: Ambulatory Visit | Attending: Orthopedic Surgery | Admitting: Orthopedic Surgery

## 2023-05-22 ENCOUNTER — Ambulatory Visit (HOSPITAL_COMMUNITY)
Admission: RE | Admit: 2023-05-22 | Discharge: 2023-05-22 | Disposition: A | Discharge status: Home / Self Care | Payer: Medicare Other | Source: Ambulatory Visit | Attending: Gastroenterology | Admitting: Gastroenterology

## 2023-05-22 DIAGNOSIS — M25512 Pain in left shoulder: Secondary | ICD-10-CM | POA: Diagnosis not present

## 2023-05-22 DIAGNOSIS — G8929 Other chronic pain: Secondary | ICD-10-CM | POA: Diagnosis not present

## 2023-05-22 DIAGNOSIS — K7460 Unspecified cirrhosis of liver: Secondary | ICD-10-CM | POA: Insufficient documentation

## 2023-05-22 DIAGNOSIS — M948X1 Other specified disorders of cartilage, shoulder: Secondary | ICD-10-CM | POA: Diagnosis not present

## 2023-05-22 DIAGNOSIS — M7582 Other shoulder lesions, left shoulder: Secondary | ICD-10-CM | POA: Diagnosis not present

## 2023-05-22 DIAGNOSIS — Z9049 Acquired absence of other specified parts of digestive tract: Secondary | ICD-10-CM | POA: Diagnosis not present

## 2023-05-22 DIAGNOSIS — M19012 Primary osteoarthritis, left shoulder: Secondary | ICD-10-CM | POA: Diagnosis not present

## 2023-05-22 DIAGNOSIS — R932 Abnormal findings on diagnostic imaging of liver and biliary tract: Secondary | ICD-10-CM | POA: Diagnosis not present

## 2023-05-28 ENCOUNTER — Encounter: Payer: Self-pay | Admitting: Orthopedic Surgery

## 2023-06-07 ENCOUNTER — Other Ambulatory Visit: Payer: Self-pay

## 2023-06-07 DIAGNOSIS — Z87891 Personal history of nicotine dependence: Secondary | ICD-10-CM

## 2023-06-07 DIAGNOSIS — Z122 Encounter for screening for malignant neoplasm of respiratory organs: Secondary | ICD-10-CM

## 2023-06-12 ENCOUNTER — Other Ambulatory Visit: Payer: Self-pay | Admitting: Family Medicine

## 2023-06-12 DIAGNOSIS — E7800 Pure hypercholesterolemia, unspecified: Secondary | ICD-10-CM

## 2023-06-12 DIAGNOSIS — I10 Essential (primary) hypertension: Secondary | ICD-10-CM

## 2023-06-18 ENCOUNTER — Other Ambulatory Visit: Payer: Self-pay | Admitting: Pulmonary Disease

## 2023-06-19 ENCOUNTER — Encounter: Payer: Self-pay | Admitting: Orthopedic Surgery

## 2023-07-03 ENCOUNTER — Ambulatory Visit: Payer: Medicare Other

## 2023-07-03 VITALS — Ht 63.0 in | Wt 191.0 lb

## 2023-07-03 DIAGNOSIS — Z0000 Encounter for general adult medical examination without abnormal findings: Secondary | ICD-10-CM

## 2023-07-03 DIAGNOSIS — Z780 Asymptomatic menopausal state: Secondary | ICD-10-CM

## 2023-07-03 NOTE — Patient Instructions (Signed)
[name] , Thank you for taking time to come for your Medicare Wellness Visit. I appreciate your ongoing commitment to your health goals. Please review the following plan we discussed and let me know if I can assist you in the future.   Referrals/Orders/Follow-Ups/Clinician Recommendations: Aim for 30 minutes of exercise or brisk walking, 6-8 glasses of water, and 5 servings of fruits and vegetables each day.  This is a list of the screening recommended for you and due dates:  Health Maintenance  Topic Date Due   COVID-19 Vaccine (2 - Moderna risk series) 11/03/2019   DEXA scan (bone density measurement)  05/14/2023   Screening for Lung Cancer  06/20/2023   Flu Shot  09/03/2023*   Mammogram  02/08/2024   Medicare Annual Wellness Visit  07/02/2024   Pap with HPV screening  11/29/2027   DTaP/Tdap/Td vaccine (2 - Td or Tdap) 01/05/2028   Colon Cancer Screening  07/06/2030   Pneumococcal Vaccination  Completed   Hepatitis C Screening  Completed   HIV Screening  Completed   Zoster (Shingles) Vaccine  Completed   HPV Vaccine  Aged Out  *Topic was postponed. The date shown is not the original due date.    Advanced directives: (ACP Link)Information on Advanced Care Planning can be found at [facility] Advance Health Care Directives Advance Health Care Directives ([email or web address]   Next Medicare Annual Wellness Visit scheduled for next year: Yes

## 2023-07-03 NOTE — Addendum Note (Signed)
Addended by: Kandis Fantasia B on: 07/03/2023 09:29 AM   Modules accepted: Orders

## 2023-07-03 NOTE — Progress Notes (Signed)
Subjective:   Jamie Mcgee is a 63 y.o. female who presents for Medicare Annual (Subsequent) preventive examination.  Visit Complete: Virtual I connected with  [name] on 07/03/23 by a audio enabled telemedicine application and verified that I am speaking with the correct person using two identifiers.  Patient Location: Home  Provider Location: Home Office  This patient declined Interactive audio and video telecommunications. Therefore the visit was completed with audio only.  I discussed the limitations of evaluation and management by telemedicine. The patient expressed understanding and agreed to proceed.  Vital Signs: Because this visit was a virtual/telehealth visit, some criteria may be missing or patient reported. Any vitals not documented were not able to be obtained and vitals that have been documented are patient reported.  Patient Medicare AWV questionnaire was completed by the patient on 06/29/23; I have confirmed that all information answered by patient is correct and no changes since this date.  Cardiac Risk Factors include: advanced age (>35men, >83 women);dyslipidemia;hypertension;sedentary lifestyle;smoking/ tobacco exposure     Objective:    Today's Vitals   07/03/23 0849  Weight: 191 lb (86.6 kg)  Height: 5\' 3"  (1.6 m)   Body mass index is 33.83 kg/m.     07/03/2023    9:01 AM 06/28/2022   10:31 AM 11/30/2021   10:10 AM 11/11/2021   11:03 AM 06/20/2021    7:08 PM 05/10/2021    2:55 PM 07/06/2020   11:32 AM  Advanced Directives  Does Patient Have a Medical Advance Directive? No Yes Yes Yes No No No  Type of Advance Directive  Living will;Healthcare Power of [employer] Power of [employer];Living will Healthcare Power of [place];Living will     Does patient want to make changes to medical advance directive?  No - Patient declined No - Patient declined No - Patient declined     Copy of Healthcare Power of Attorney in Chart?  No - copy requested No  - copy requested No - copy requested     Would patient like information on creating a medical advance directive? Yes (MAU/Ambulatory/Procedural Areas - Information given)  No - Patient declined   Yes (MAU/Ambulatory/Procedural Areas - Information given)     Current Medications (verified) Outpatient Encounter Medications as of 07/03/2023  Medication Sig   albuterol (VENTOLIN HFA) 108 (90 Base) MCG/ACT inhaler Inhale 2 puffs into the lungs every 6 (six) hours as needed for wheezing or shortness of breath.   Budeson-Glycopyrrol-Formoterol (BREZTRI AEROSPHERE) 160-9-4.8 MCG/ACT AERO USE 2 INHALATIONS BY MOUTH TWICE DAILY   busPIRone (BUSPAR) 15 MG tablet TAKE ONE TABLET BY MOUTH TWICE DAILY FOR ANXIETY   calcium carbonate (OS-CAL - DOSED IN MG OF ELEMENTAL CALCIUM) 1250 (500 Ca) MG tablet Take 1 tablet by mouth.   CALCIUM-VITAMIN D PO Take 1 tablet by mouth daily.   carvedilol (COREG) 6.25 MG tablet TAKE 1 TABLET BY MOUTH TWICE  DAILY WITH A MEAL   Cholecalciferol (VITAMIN D-3) 125 MCG (5000 UT) TABS Take by mouth.   citalopram (CELEXA) 40 MG tablet Take 1 tablet (40 mg total) by mouth daily.   dicyclomine (BENTYL) 10 MG capsule Take 1 capsule (10 mg total) by mouth 4 (four) times daily -  before meals and at bedtime.   fexofenadine (ALLEGRA) 180 MG tablet Take 180 mg by mouth daily as needed.   fluticasone (FLONASE) 50 MCG/ACT nasal spray Place 2 sprays into both nostrils daily.   hydrochlorothiazide (HYDRODIURIL) 25 MG tablet TAKE 1 TABLET BY MOUTH  DAILY   losartan (COZAAR) 100 MG tablet TAKE 1 TABLET BY MOUTH DAILY   nitroGLYCERIN (NITROSTAT) 0.4 MG SL tablet Place 1 tablet (0.4 mg total) under the tongue every 5 (five) minutes as needed.   pantoprazole (PROTONIX) 20 MG tablet TAKE 1 TABLET BY MOUTH DAILY FOR ACID REFLUX   pregabalin (LYRICA) 300 MG capsule Take 300mg  each evening.   rosuvastatin (CRESTOR) 5 MG tablet TAKE 1 TABLET BY MOUTH AT  BEDTIME   No facility-administered encounter  medications on file as of 07/03/2023.    Allergies (verified) Lipitor [atorvastatin] and Naproxen   History: Past Medical History:  Diagnosis Date   Allergy    Anxiety    Arthritis    Binge eating disorder    Carpal tunnel syndrome on right    Cataract    Both lenses been replaced   Cervical stenosis of spine    C5-6   COPD (chronic obstructive pulmonary disease) (HCC)    Gold III   Depression    Emphysema of lung (HCC)    Encounter for screening fecal occult blood testing 11/29/2022   Fibromuscular dysplasia of renal artery (HCC) 2006   GERD (gastroesophageal reflux disease)    Headache    migraines 20 years ago   Hepatitis    type A in 1977   HNP (herniated nucleus pulposus), cervical    HPV (human papilloma virus) infection    HTN (hypertension)    Neuropathy    Sleep apnea    Vitamin D deficiency    Past Surgical History:  Procedure Laterality Date   ANTERIOR CERVICAL DECOMP/DISCECTOMY FUSION N/A 10/05/2017   Procedure: C5-6 ANTERIOR CERVICAL DECOMPRESSION/DISCECTOMY FUSION,  ALLOGRAFT, PLATE  (NECK FIRST AND CARPAL TUNNEL RELEASE SECOND);  Surgeon: Eldred Manges, MD;  Location: [facility] OR;  Service: Orthopedics;  Laterality: N/A;   APPENDECTOMY  1993   BREAST BIOPSY Right    CARPAL TUNNEL RELEASE Right 10/05/2017   Procedure: RIGHT CARPAL TUNNEL RELEASE;  Surgeon: Eldred Manges, MD;  Location: [facility] OR;  Service: Orthopedics;  Laterality: Right;   CATARACT EXTRACTION  08/2010   left    CATARACT EXTRACTION W/PHACO  06/01/2011   Procedure: CATARACT EXTRACTION PHACO AND INTRAOCULAR LENS PLACEMENT (IOC);  Surgeon: Gemma Payor;  Location: [facility] ORS;  Service: Ophthalmology;  Laterality: Right;  CDE:9.62   CHOLECYSTECTOMY     COLONOSCOPY WITH PROPOFOL N/A 07/06/2020   Procedure: COLONOSCOPY WITH PROPOFOL;  Surgeon: Lanelle Bal, DO;  Location: [facility] ENDOSCOPY;  Service: Endoscopy;  Laterality: N/A;  11:00am   EYE SURGERY     Cataract removal   RENAL ARTERY ANGIOPLASTY  2006    right   TUBAL LIGATION  1984   [facility]   Family History  Problem Relation Age of Onset   Cancer Mother        breast   Anxiety disorder Mother    Arthritis Mother    Depression Mother    Obesity Mother    Coronary artery disease Father        States congenital abnormality   Diabetes Father    Hyperlipidemia Father    Early death Father    CAD Sister        Stents    Cancer Sister        breast   Anxiety disorder Sister    Asthma Sister    Depression Sister    Obesity Sister    Cancer Maternal Grandmother        breast  Heart disease Maternal Grandfather    Cancer Maternal Aunt        breast   Anesthesia problems Neg Hx    Hypotension Neg Hx    Malignant hyperthermia Neg Hx    Pseudochol deficiency Neg Hx    Colon cancer Neg Hx    Liver disease Neg Hx    Social History   Socioeconomic History   Marital status: Divorced    Spouse name: Not on file   Number of children: Not on file   Years of education: Not on file   Highest education level: GED or equivalent  Occupational History   Not on file  Tobacco Use   Smoking status: Every Day    Current packs/day: 0.00    Average packs/day: 1 pack/day for 40.0 years (40.0 ttl pk-yrs)    Types: Cigarettes    Start date: 11/01/1981    Last attempt to quit: 11/01/2021    Years since quitting: 1.6   Smokeless tobacco: Never   Tobacco comments:    Quit 11/15/21  Vaping Use   Vaping status: Never Used  Substance and Sexual Activity   Alcohol use: No   Drug use: Yes    Frequency: 0.5 times per week    Types: Marijuana    Comment: daily   Sexual activity: Not Currently    Birth control/protection: Abstinence, Post-menopausal    Comment: tubal  Other Topics Concern   Not on file  Social History Narrative   The patient does not have any sex drive and has not had one for almost a year.   Her   husband thinks she does not love him anymore.  He does not seem to understand   her   depression.   Social Drivers of Manufacturing engineer Strain: Low Risk  (07/03/2023)   Overall Financial Resource Strain (CARDIA)    Difficulty of Paying Living Expenses: Not hard at all  Food Insecurity: Food Insecurity Present (07/03/2023)   Hunger Vital Sign    Worried About Running Out of Food in the Last Year: Sometimes true    Ran Out of Food in the Last Year: Never true  Transportation Needs: No Transportation Needs (07/03/2023)   PRAPARE - Administrator, Civil Service (Medical): No    Lack of Transportation (Non-Medical): No  Physical Activity: Insufficiently Active (07/03/2023)   Exercise Vital Sign    Days of Exercise per Week: 3 days    Minutes of Exercise per Session: 30 min  Stress: No Stress Concern Present (07/03/2023)   [employer] of Occupational Health - Occupational Stress Questionnaire    Feeling of Stress : Only a little  Social Connections: Socially Isolated (07/03/2023)   Social Connection and Isolation Panel [NHANES]    Frequency of Communication with Friends and Family: Three times a week    Frequency of Social Gatherings with Friends and Family: Once a week    Attends Religious Services: Never    Database administrator or Organizations: No    Attends Engineer, structural: Never    Marital Status: Divorced    Tobacco Counseling Ready to quit: Yes Counseling given: Not Answered Tobacco comments: Quit 11/15/21   Clinical Intake:  Pre-visit preparation completed: Yes  Pain : No/denies pain     Diabetes: No  How often do you need to have someone help you when you read instructions, pamphlets, or other written materials from your doctor or pharmacy?: 1 - Never  Interpreter Needed?: No  Information entered by :: Kandis Fantasia LPN   Activities of Daily Living    06/29/2023    9:46 AM  In your present state of health, do you have any difficulty performing the following activities:  Hearing? 0  Vision? 0  Difficulty concentrating or making decisions? 1   Walking or climbing stairs? 0  Dressing or bathing? 0  Doing errands, shopping? 0  Preparing Food and eating ? N  Using the Toilet? N  In the past six months, have you accidently leaked urine? N  Do you have problems with loss of bowel control? N  Managing your Medications? N  Managing your Finances? N  Housekeeping or managing your Housekeeping? N    Patient Care Team: Raliegh Ip, DO as PCP - General (Family Medicine) Wyline Mood Dorothe Pea, MD as PCP - Cardiology (Cardiology) Lanelle Bal, DO as Consulting Physician (Internal Medicine) Doreatha Massed, MD as Medical Oncologist (Hematology) Oliver Barre, MD as Consulting Physician (Orthopedic Surgery) Dr [facility], OD (Optometry) Allison Quarry, Ruby Cola, NP as Nurse Practitioner (Nurse Practitioner) Adline Potter, NP as Nurse Practitioner (Obstetrics and Gynecology)  Indicate any recent Medical Services you may have received from other than [facility] providers in the past year (date may be approximate).     Assessment:   This is a routine wellness examination for Jamie Mcgee.  Hearing/Vision screen Hearing Screening - Comments:: Denies hearing difficulties   Vision Screening - Comments:: Wears rx glasses - up to date with routine eye exams with Dr. Desiree Lucy     Goals Addressed   None   Depression Screen    07/03/2023    9:00 AM 04/12/2023    3:25 PM 11/29/2022    1:36 PM 08/14/2022    9:17 AM 06/28/2022   10:29 AM 05/08/2022    2:24 PM 11/18/2021   12:45 PM  PHQ 2/9 Scores  PHQ - 2 Score 0 0 2 1 4 4 2   PHQ- 9 Score  2 5 7 15 15 7     Fall Risk    06/29/2023    9:46 AM 04/12/2023    3:26 PM 11/29/2022    1:35 PM 08/14/2022    9:34 AM 06/28/2022   10:26 AM  Fall Risk   Falls in the past year? 0 0 0 0 0  Number falls in past yr: 0 0 0 0 0  Injury with Fall? 1 0 0 0 0  Risk for fall due to : No Fall Risks History of fall(s) No Fall Risks No Fall Risks   Follow up Falls prevention  discussed;Education provided;Falls evaluation completed Falls evaluation completed Falls evaluation completed Education provided Falls prevention discussed;Education provided;Falls evaluation completed    MEDICARE RISK AT HOME: Medicare Risk at Home Any stairs in or around the home?: (Patient-Rptd) Yes If so, are there any without handrails?: (Patient-Rptd) Yes Home free of loose throw rugs in walkways, pet beds, electrical cords, etc?: (Patient-Rptd) Yes Adequate lighting in your home to reduce risk of falls?: (Patient-Rptd) Yes Life alert?: (Patient-Rptd) No Use of a cane, walker or w/c?: (Patient-Rptd) No Grab bars in the bathroom?: (Patient-Rptd) No Shower chair or bench in shower?: (Patient-Rptd) No Elevated toilet seat or a handicapped toilet?: (Patient-Rptd) No  TIMED UP AND GO:  Was the test performed?  No    Cognitive Function:    05/10/2021    2:07 PM  MMSE - Mini Mental State Exam  Orientation to time 5  Orientation to Place 5  Registration 3  Attention/ Calculation 5  Recall 3  Language- name 2 objects 2  Language- repeat 1  Language- follow 3 step command 3  Language- read & follow direction 1  Write a sentence 1  Copy design 1  Total score 30        07/03/2023    9:01 AM 06/28/2022   10:32 AM  6CIT Screen  What Year? 0 points 0 points  What month? 0 points 0 points  What time? 0 points 0 points  Count back from 20 0 points 0 points  Months in reverse 0 points 0 points  Repeat phrase 0 points 0 points  Total Score 0 points 0 points    Immunizations Immunization History  Administered Date(s) Administered   Fluad Quad(high Dose 65+) 04/06/2022   Influenza,inj,Quad PF,6+ Mos 03/15/2018, 03/10/2020   Moderna Sars-Covid-2 Vaccination 10/06/2019   PNEUMOCOCCAL CONJUGATE-20 04/13/2021   Pneumococcal Polysaccharide-23 01/04/2018   Tdap 01/04/2018   Zoster Recombinant(Shingrix) 10/11/2020, 03/24/2021    TDAP status: Up to date  Flu Vaccine status:  Due, Education has been provided regarding the importance of this vaccine. Advised may receive this vaccine at local pharmacy or Health Dept. Aware to provide a copy of the vaccination record if obtained from local pharmacy or Health Dept. Verbalized acceptance and understanding.  Pneumococcal vaccine status: Up to date  Covid-19 vaccine status: Information provided on how to obtain vaccines.   Qualifies for Shingles Vaccine? Yes   Zostavax completed No   Shingrix Completed?: Yes  Screening Tests Health Maintenance  Topic Date Due   COVID-19 Vaccine (2 - Moderna risk series) 11/03/2019   DEXA SCAN  05/14/2023   Lung Cancer Screening  06/20/2023   INFLUENZA VACCINE  09/03/2023 (Originally 01/04/2023)   MAMMOGRAM  02/08/2024   Medicare Annual Wellness (AWV)  07/02/2024   Cervical Cancer Screening (HPV/Pap Cotest)  11/29/2027   DTaP/Tdap/Td (2 - Td or Tdap) 01/05/2028   Colonoscopy  07/06/2030   Pneumococcal Vaccine 9-76 Years old  Completed   Hepatitis C Screening  Completed   HIV Screening  Completed   Zoster Vaccines- Shingrix  Completed   HPV VACCINES  Aged Out    Health Maintenance  Health Maintenance Due  Topic Date Due   COVID-19 Vaccine (2 - Moderna risk series) 11/03/2019   DEXA SCAN  05/14/2023   Lung Cancer Screening  06/20/2023    Colorectal cancer screening: Type of screening: Colonoscopy. Completed 07/06/20. Repeat every 10 years  Mammogram status: Completed 02/08/23. Repeat every year  Bone Density status: Completed 07/03/20. Results reflect: Bone density results: OSTEOPENIA. Repeat every 2 years.  Lung Cancer Screening: (Low Dose CT Chest recommended if Age 11-80 years, 20 pack-year currently smoking OR have quit w/in 15years.) does qualify.   Lung Cancer Screening Referral: last 06/19/22; scheduled for 07/31/23  Additional Screening:  Hepatitis C Screening: does qualify; Completed 09/05/22  Vision Screening: Recommended annual ophthalmology exams for early  detection of glaucoma and other disorders of the eye. Is the patient up to date with their annual eye exam?  Yes  Who is the provider or what is the name of the office in which the patient attends annual eye exams? Dr. Addison Naegeli If pt is not established with a provider, would they like to be referred to a provider to establish care? No .   Dental Screening: Recommended annual dental exams for proper oral hygiene  Community Resource Referral / Chronic Care Management: CRR required this  visit?  No   CCM required this visit?  No     Plan:     I have personally reviewed and noted the following in the patient's chart:   Medical and social history Use of alcohol, tobacco or illicit drugs  Current medications and supplements including opioid prescriptions. Patient is not currently taking opioid prescriptions. Functional ability and status Nutritional status Physical activity Advanced directives List of other physicians Hospitalizations, surgeries, and ER visits in previous 12 months Vitals Screenings to include cognitive, depression, and falls Referrals and appointments  In addition, I have reviewed and discussed with patient certain preventive protocols, quality metrics, and best practice recommendations. A written personalized care plan for preventive services as well as general preventive health recommendations were provided to patient.     Kandis Fantasia [place], [place]   1/61/0960   After Visit Summary: (MyChart) Due to this being a telephonic visit, the after visit summary with patients personalized plan was offered to patient via MyChart   Nurse Notes: No concerns at this time

## 2023-07-16 MED ORDER — ALBUTEROL SULFATE HFA 108 (90 BASE) MCG/ACT IN AERS: 2.0000 | INHALATION_SPRAY | Freq: Four times a day (QID) | RESPIRATORY_TRACT | 1 refills | Status: DC | PRN

## 2023-07-31 ENCOUNTER — Ambulatory Visit (HOSPITAL_COMMUNITY)
Admission: RE | Admit: 2023-07-31 | Discharge: 2023-07-31 | Disposition: A | Discharge status: Home / Self Care | Payer: Medicare Other | Source: Ambulatory Visit | Attending: Physician Assistant | Admitting: Physician Assistant

## 2023-07-31 DIAGNOSIS — Z122 Encounter for screening for malignant neoplasm of respiratory organs: Secondary | ICD-10-CM | POA: Insufficient documentation

## 2023-07-31 DIAGNOSIS — Z87891 Personal history of nicotine dependence: Secondary | ICD-10-CM | POA: Diagnosis not present

## 2023-07-31 DIAGNOSIS — F17210 Nicotine dependence, cigarettes, uncomplicated: Secondary | ICD-10-CM | POA: Diagnosis not present

## 2023-08-20 ENCOUNTER — Telehealth: Payer: Self-pay | Admitting: Family Medicine

## 2023-08-20 ENCOUNTER — Encounter: Payer: Self-pay | Admitting: Family Medicine

## 2023-08-20 ENCOUNTER — Other Ambulatory Visit: Payer: Self-pay

## 2023-08-20 DIAGNOSIS — F4323 Adjustment disorder with mixed anxiety and depressed mood: Secondary | ICD-10-CM

## 2023-08-20 DIAGNOSIS — I10 Essential (primary) hypertension: Secondary | ICD-10-CM

## 2023-08-20 DIAGNOSIS — G8929 Other chronic pain: Secondary | ICD-10-CM

## 2023-08-20 DIAGNOSIS — F331 Major depressive disorder, recurrent, moderate: Secondary | ICD-10-CM

## 2023-08-20 MED ORDER — CARVEDILOL 6.25 MG PO TABS: 6.2500 mg | ORAL_TABLET | Freq: Two times a day (BID) | ORAL | 1 refills | Status: DC

## 2023-08-20 MED ORDER — BUSPIRONE HCL 15 MG PO TABS: ORAL_TABLET | ORAL | 1 refills | Status: DC

## 2023-08-20 MED ORDER — CITALOPRAM HYDROBROMIDE 40 MG PO TABS: 40.0000 mg | ORAL_TABLET | Freq: Every day | ORAL | 3 refills | Status: AC

## 2023-08-20 MED ORDER — PREGABALIN 300 MG PO CAPS: ORAL_CAPSULE | ORAL | 1 refills | Status: DC

## 2023-08-20 NOTE — Telephone Encounter (Unsigned)
 Copied from CRM [id number]. Topic: Appointments - Scheduling Inquiry for Clinic >> Aug 17, 2023  4:57 PM Higinio Roger wrote: Patient needs bone density scan and physical rescheduled. Unable to schedule together. Patient would like to schedule for week of May 12 if possible.   Callback #: [phone]

## 2023-08-21 NOTE — Telephone Encounter (Signed)
Appt made and patient notified 

## 2023-08-23 ENCOUNTER — Ambulatory Visit: Payer: Medicare Other | Admitting: Nurse Practitioner

## 2023-08-27 NOTE — Progress Notes (Signed)
 Patient notified of LDCT Lung Cancer Screening Results via mail with the recommendation to follow-up in 12 months. Patient's referring provider has been sent a copy of results. Results are as follows:  IMPRESSION: 1. Lung-RADS 2, benign appearance or behavior. Continue annual screening with low-dose chest CT without contrast in 12 months. 2. Coronary artery calcifications. 3. Morphologic features of the liver compatible with cirrhosis. 4. Aortic Atherosclerosis (ICD10-I70.0) and Emphysema (ICD10-J43.9).

## 2023-08-30 ENCOUNTER — Other Ambulatory Visit: Payer: Self-pay | Admitting: Pulmonary Disease

## 2023-09-20 ENCOUNTER — Encounter: Payer: Self-pay | Admitting: Gastroenterology

## 2023-09-20 ENCOUNTER — Ambulatory Visit: Payer: Medicare Other | Admitting: Nurse Practitioner

## 2023-09-28 ENCOUNTER — Encounter: Payer: Medicare Other | Admitting: Family Medicine

## 2023-09-28 ENCOUNTER — Other Ambulatory Visit: Payer: Medicare Other

## 2023-10-04 HISTORY — PX: UPPER GASTROINTESTINAL ENDOSCOPY: SHX188

## 2023-10-06 NOTE — H&P (View-Only) (Signed)
 GI Office Note    Referring Provider: Eliodoro Guerin, DO Primary Care Physician:  Eliodoro Guerin, DO  Primary Gastroenterologist: Rolando Cliche. Mordechai April, DO   Chief Complaint   Chief Complaint  Patient presents with   Follow-up    Doing well, no issues    History of Present Illness   Jamie Mcgee is a 63 y.o. female presenting today for follow up. Last seen 04/2023. She has h/o cirrhosis diagnosed in 2024.    AMA positive. ANA positive. LFTs normal on recheck. 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 Advised following labs and u/s every six months. No need for liver biopsy right now.   Immune to Hep B.   05/2023: MELD 3.0 of 10.   RUQ U/S 05/2023: steatosis, no hepatoma  Today: keeps a lot of gas. Colon cleanse every night. BM 1-2 times daily. No abdominal pain. Appetite too good. Weight up 7 pounds. No melena, brbpr. No heartburn. No n/v. Dysphagia to solid foods for about a year. Sometimes will cough and it will come up.   No prior EGD.   Colonoscopy 07/2020: -non-bleeding internal hemorrhoids -next colonoscopy 10 years  Medications   Current Outpatient Medications  Medication Sig Dispense Refill   albuterol  (VENTOLIN  HFA) 108 (90 Base) MCG/ACT inhaler INHALE 2 PUFFS INTO THE LUNGS EVERY 6 HOURS AS NEEDED FOR WHEEZING OR SHORTNESS OF BREATH 18 g 5   Budeson-Glycopyrrol-Formoterol  (BREZTRI  AEROSPHERE) 160-9-4.8 MCG/ACT AERO USE 2 INHALATIONS BY MOUTH TWICE DAILY 32.1 g 7   busPIRone  (BUSPAR ) 15 MG tablet TAKE ONE TABLET BY MOUTH TWICE DAILY FOR ANXIETY 90 tablet 1   calcium  carbonate (OS-CAL - DOSED IN MG OF ELEMENTAL CALCIUM ) 1250 (500 Ca) MG tablet Take 1 tablet by mouth.     CALCIUM -VITAMIN D  PO Take 1 tablet by mouth daily.     carvedilol  (COREG ) 6.25 MG tablet Take 1 tablet (6.25 mg total) by mouth 2 (two) times daily with a meal. 200 tablet 1    Cholecalciferol  (VITAMIN D -3) 125 MCG (5000 UT) TABS Take by mouth.     citalopram  (CELEXA ) 40 MG tablet Take 1 tablet (40 mg total) by mouth daily. 90 tablet 3   fexofenadine (ALLEGRA) 180 MG tablet Take 180 mg by mouth daily as needed.     fluticasone  (FLONASE ) 50 MCG/ACT nasal spray Place 2 sprays into both nostrils daily. 16 g 6   hydrochlorothiazide  (HYDRODIURIL ) 25 MG tablet TAKE 1 TABLET BY MOUTH DAILY 100 tablet 1   losartan  (COZAAR ) 100 MG tablet TAKE 1 TABLET BY MOUTH DAILY 100 tablet 1   nitroGLYCERIN  (NITROSTAT ) 0.4 MG SL tablet Place 1 tablet (0.4 mg total) under the tongue every 5 (five) minutes as needed. 25 tablet 3   pantoprazole  (PROTONIX ) 20 MG tablet TAKE 1 TABLET BY MOUTH DAILY FOR ACID REFLUX 100 tablet 1   pregabalin  (LYRICA ) 300 MG capsule Take 300mg  each evening. 90 capsule 1   rosuvastatin  (CRESTOR ) 5 MG tablet TAKE 1 TABLET BY MOUTH AT  BEDTIME 100 tablet 1   No current facility-administered medications for this visit.    Allergies   Allergies as of 10/08/2023 - Review Complete 10/08/2023  Allergen Reaction Noted   Lipitor [atorvastatin ] Other (See Comments) 10/13/2014   Naproxen  Nausea And Vomiting 06/04/2015     Past Medical History   Past Medical History:  Diagnosis Date   Allergy  Anxiety    Arthritis    Binge eating disorder    Carpal tunnel syndrome on right    Cataract    Both lenses been replaced   Cervical stenosis of spine    C5-6   COPD (chronic obstructive pulmonary disease) (HCC)    Gold III   Depression    Emphysema of lung (HCC)    Encounter for screening fecal occult blood testing 11/29/2022   Fibromuscular dysplasia of renal artery (HCC) 2006   GERD (gastroesophageal reflux disease)    Headache    migraines 20 years ago   Hepatitis    type A in 1977   HNP (herniated nucleus pulposus), cervical    HPV (human papilloma virus) infection    HTN (hypertension)    Neuropathy    Sleep apnea    Vitamin D  deficiency     Past  Surgical History   Past Surgical History:  Procedure Laterality Date   ANTERIOR CERVICAL DECOMP/DISCECTOMY FUSION N/A 10/05/2017   Procedure: C5-6 ANTERIOR CERVICAL DECOMPRESSION/DISCECTOMY FUSION,  ALLOGRAFT, PLATE  (NECK FIRST AND CARPAL TUNNEL RELEASE SECOND);  Surgeon: Adah Acron, MD;  Location: [facility] OR;  Service: Orthopedics;  Laterality: N/A;   APPENDECTOMY  1993   BREAST BIOPSY Right    CARPAL TUNNEL RELEASE Right 10/05/2017   Procedure: RIGHT CARPAL TUNNEL RELEASE;  Surgeon: Adah Acron, MD;  Location: [facility] OR;  Service: Orthopedics;  Laterality: Right;   CATARACT EXTRACTION  08/2010   left    CATARACT EXTRACTION W/PHACO  06/01/2011   Procedure: CATARACT EXTRACTION PHACO AND INTRAOCULAR LENS PLACEMENT (IOC);  Surgeon: Anner Kill;  Location: [facility];  Service: Ophthalmology;  Laterality: Right;  CDE:9.62   CHOLECYSTECTOMY     COLONOSCOPY WITH PROPOFOL  N/A 07/06/2020   Procedure: COLONOSCOPY WITH PROPOFOL ;  Surgeon: [name], DO;  Location: [facility] ENDOSCOPY;  Service: Endoscopy;  Laterality: N/A;  11:00am   EYE SURGERY     Cataract removal   RENAL ARTERY ANGIOPLASTY  2006   right   TUBAL LIGATION  1984   [facility]    Past Family History   Family History  Problem Relation Age of Onset   Cancer Mother        breast   Anxiety disorder Mother    Arthritis Mother    Depression Mother    Obesity Mother    Coronary artery disease Father        States congenital abnormality   Diabetes Father    Hyperlipidemia Father    Early death Father    CAD Sister        Stents    Cancer Sister        breast   Anxiety disorder Sister    Asthma Sister    Depression Sister    Obesity Sister    Cancer Maternal Grandmother        breast   Heart disease Maternal Grandfather    Cancer Maternal Aunt        breast   Anesthesia problems Neg Hx    Hypotension Neg Hx    Malignant hyperthermia Neg Hx    Pseudochol deficiency Neg Hx    Colon cancer Neg Hx    Liver disease Neg Hx      Past Social History   Social History   Socioeconomic History   Marital status: Divorced    Spouse name: Not on file   Number of children: Not on file   Years of education: Not on file  Highest education level: GED or equivalent  Occupational History   Not on file  Tobacco Use   Smoking status: Every Day    Current packs/day: 0.00    Average packs/day: 1 pack/day for 40.0 years (40.0 ttl pk-yrs)    Types: Cigarettes    Start date: 11/01/1981    Last attempt to quit: 11/01/2021    Years since quitting: 1.9   Smokeless tobacco: Never   Tobacco comments:    Quit 11/15/21  Vaping Use   Vaping status: Never Used  Substance and Sexual Activity   Alcohol use: No   Drug use: Yes    Frequency: 0.5 times per week    Types: Marijuana    Comment: daily   Sexual activity: Not Currently    Birth control/protection: Abstinence, Post-menopausal    Comment: tubal  Other Topics Concern   Not on file  Social History Narrative   The patient does not have any sex drive and has not had one for almost a year.   Her   husband thinks she does not love him anymore.  He does not seem to understand   her   depression.   Social Drivers of Corporate investment banker Strain: Low Risk  (07/03/2023)   Overall Financial Resource Strain (CARDIA)    Difficulty of Paying Living Expenses: Not hard at all  Food Insecurity: Food Insecurity Present (07/03/2023)   Hunger Vital Sign    Worried About Running Out of Food in the Last Year: Sometimes true    Ran Out of Food in the Last Year: Never true  Transportation Needs: No Transportation Needs (07/03/2023)   PRAPARE - Administrator, Civil Service (Medical): No    Lack of Transportation (Non-Medical): No  Physical Activity: Insufficiently Active (07/03/2023)   Exercise Vital Sign    Days of Exercise per Week: 3 days    Minutes of Exercise per Session: 30 min  Stress: No Stress Concern Present (07/03/2023)   [employer] of  Occupational Health - Occupational Stress Questionnaire    Feeling of Stress : Only a little  Social Connections: Socially Isolated (07/03/2023)   Social Connection and Isolation Panel [NHANES]    Frequency of Communication with Friends and Family: Three times a week    Frequency of Social Gatherings with Friends and Family: Once a week    Attends Religious Services: Never    Database administrator or Organizations: No    Attends Banker Meetings: Never    Marital Status: Divorced  Catering manager Violence: Not At Risk (07/03/2023)   Humiliation, Afraid, Rape, and Kick questionnaire    Fear of Current or Ex-Partner: No    Emotionally Abused: No    Physically Abused: No    Sexually Abused: No    Review of Systems   General: Negative for anorexia, weight loss, fever, chills, fatigue, weakness. ENT: Negative for hoarseness,  nasal congestion. See hpi CV: Negative for chest pain, angina, palpitations, dyspnea on exertion, peripheral edema.  Respiratory: Negative for dyspnea at rest, dyspnea on exertion, +cough, sputum, wheezing.  GI: See history of present illness. GU:  Negative for dysuria, hematuria, urinary incontinence, urinary frequency, nocturnal urination.  Endo: Negative for unusual weight change.     Physical Exam   BP 128/64 (BP Location: Right Arm, Patient Position: Sitting, Cuff Size: Large)   Pulse 64   Temp 98.2 F (36.8 C) (Oral)   Ht 5\' 4"  (1.626 m)   Wt 206  lb 6.4 oz (93.6 kg)   LMP 05/25/2011   SpO2 98%   BMI 35.43 kg/m    General: Well-nourished, well-developed in no acute distress.  Eyes: No icterus. Mouth: Oropharyngeal mucosa moist and pink   Lungs: Clear to auscultation bilaterally.  Heart: Regular rate and rhythm, no murmurs rubs or gallops.  Abdomen: Bowel sounds are normal, nontender, nondistended, no hepatosplenomegaly or masses,  no abdominal bruits or hernia , no rebound or guarding.  Rectal: not performed  Extremities: No lower  extremity edema. No clubbing or deformities. Neuro: Alert and oriented x 4   Skin: Warm and dry, no jaundice.   Psych: Alert and cooperative, normal mood and affect.  Labs   Lab Results  Component Value Date   NA 134 04/17/2023   CL 91 (L) 04/17/2023   K 3.9 04/17/2023   CO2 30 (H) 04/17/2023   BUN 6 (L) 04/17/2023   CREATININE 0.85 04/17/2023   EGFR 77 04/17/2023   CALCIUM  9.6 04/17/2023   ALBUMIN 4.0 04/17/2023   GLUCOSE 103 (H) 04/17/2023   Lab Results  Component Value Date   ALT 19 04/17/2023   AST 24 04/17/2023   ALKPHOS 106 04/17/2023   BILITOT 0.7 04/17/2023   Lab Results  Component Value Date   WBC 9.9 04/17/2023   HGB 14.8 04/17/2023   HCT 45.1 04/17/2023   MCV 91 04/17/2023   PLT 252 04/17/2023   Lab Results  Component Value Date   INR 1.0 04/17/2023   INR 1.0 09/05/2022    Imaging Studies   No results found.  Assessment/Plan:   Cirrhosis:?etiology. 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 Given normal labs, no targeted treatment for PBC or autoimmune disease. She has remained well compensated. -RUQ U/S -update labs for MELD and hepatoma screening  Esophageal dysphagia: solid food dysphagia for one year. Possible esophageal web/ring/stricture.  -EGD/ED with Dr. Mordechai April. ASA 3.  I have discussed the risks, alternatives, benefits with regards to but not limited to the risk of reaction to medication, bleeding, infection, perforation and the patient is agreeable to proceed. Written consent to be obtained.   Trudie Fuse. Harles Lied, MHS, PA-C [facility]

## 2023-10-06 NOTE — Progress Notes (Unsigned)
 GI Office Note    Referring Provider: Eliodoro Guerin, DO Primary Care Physician:  Eliodoro Guerin, DO  Primary Gastroenterologist: Rolando Cliche. Mordechai April, DO   Chief Complaint   No chief complaint on file.   History of Present Illness   Jamie Mcgee is a 63 y.o. female presenting today for follow up. Last seen 04/2023. She has h/o cirrhosis diagnosed in 2024.     AMA positive. ANA positive. LFTs normal on recheck. 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 Advised following labs and u/s every six months. No need for liver biopsy right now.   Immune to Hep B.   05/2023: MELD 3.0 of 10.   RUQ U/S 05/2023: steatosis, no hepatoma  No prior EGD.   Colonoscopy 07/2020: -non-bleeding internal hemorrhoids -next colonoscopy 10 years  Medications   Current Outpatient Medications  Medication Sig Dispense Refill   albuterol  (VENTOLIN  HFA) 108 (90 Base) MCG/ACT inhaler INHALE 2 PUFFS INTO THE LUNGS EVERY 6 HOURS AS NEEDED FOR WHEEZING OR SHORTNESS OF BREATH 18 g 5   Budeson-Glycopyrrol-Formoterol  (BREZTRI  AEROSPHERE) 160-9-4.8 MCG/ACT AERO USE 2 INHALATIONS BY MOUTH TWICE DAILY 32.1 g 7   busPIRone  (BUSPAR ) 15 MG tablet TAKE ONE TABLET BY MOUTH TWICE DAILY FOR ANXIETY 90 tablet 1   calcium  carbonate (OS-CAL - DOSED IN MG OF ELEMENTAL CALCIUM ) 1250 (500 Ca) MG tablet Take 1 tablet by mouth.     CALCIUM -VITAMIN D  PO Take 1 tablet by mouth daily.     carvedilol  (COREG ) 6.25 MG tablet Take 1 tablet (6.25 mg total) by mouth 2 (two) times daily with a meal. 200 tablet 1   Cholecalciferol  (VITAMIN D -3) 125 MCG (5000 UT) TABS Take by mouth.     citalopram  (CELEXA ) 40 MG tablet Take 1 tablet (40 mg total) by mouth daily. 90 tablet 3   dicyclomine  (BENTYL ) 10 MG capsule Take 1 capsule (10 mg total) by mouth 4 (four) times daily -  before meals and at bedtime. 30 capsule 1   fexofenadine  (ALLEGRA) 180 MG tablet Take 180 mg by mouth daily as needed.     fluticasone  (FLONASE ) 50 MCG/ACT nasal spray Place 2 sprays into both nostrils daily. 16 g 6   hydrochlorothiazide  (HYDRODIURIL ) 25 MG tablet TAKE 1 TABLET BY MOUTH DAILY 100 tablet 1   losartan  (COZAAR ) 100 MG tablet TAKE 1 TABLET BY MOUTH DAILY 100 tablet 1   nitroGLYCERIN  (NITROSTAT ) 0.4 MG SL tablet Place 1 tablet (0.4 mg total) under the tongue every 5 (five) minutes as needed. 25 tablet 3   pantoprazole  (PROTONIX ) 20 MG tablet TAKE 1 TABLET BY MOUTH DAILY FOR ACID REFLUX 100 tablet 1   pregabalin  (LYRICA ) 300 MG capsule Take 300mg  each evening. 90 capsule 1   rosuvastatin  (CRESTOR ) 5 MG tablet TAKE 1 TABLET BY MOUTH AT  BEDTIME 100 tablet 1   No current facility-administered medications for this visit.    Allergies   Allergies as of 10/08/2023 - Review Complete 07/03/2023  Allergen Reaction Noted   Lipitor [atorvastatin ] Other (See Comments) 10/13/2014   Naproxen  Nausea And Vomiting 06/04/2015     Past Medical History   Past Medical History:  Diagnosis Date   Allergy    Anxiety    Arthritis    Binge eating disorder    Carpal tunnel syndrome on right    Cataract    Both lenses been replaced  Cervical stenosis of spine    C5-6   COPD (chronic obstructive pulmonary disease) (HCC)    Gold III   Depression    Emphysema of lung (HCC)    Encounter for screening fecal occult blood testing 11/29/2022   Fibromuscular dysplasia of renal artery (HCC) 2006   GERD (gastroesophageal reflux disease)    Headache    migraines 20 years ago   Hepatitis    type A in 1977   HNP (herniated nucleus pulposus), cervical    HPV (human papilloma virus) infection    HTN (hypertension)    Neuropathy    Sleep apnea    Vitamin D  deficiency     Past Surgical History   Past Surgical History:  Procedure Laterality Date   ANTERIOR CERVICAL DECOMP/DISCECTOMY FUSION N/A 10/05/2017   Procedure: C5-6 ANTERIOR CERVICAL  DECOMPRESSION/DISCECTOMY FUSION,  ALLOGRAFT, PLATE  (NECK FIRST AND CARPAL TUNNEL RELEASE SECOND);  Surgeon: Adah Acron, MD;  Location: [facility] OR;  Service: Orthopedics;  Laterality: N/A;   APPENDECTOMY  1993   BREAST BIOPSY Right    CARPAL TUNNEL RELEASE Right 10/05/2017   Procedure: RIGHT CARPAL TUNNEL RELEASE;  Surgeon: Adah Acron, MD;  Location: [facility] OR;  Service: Orthopedics;  Laterality: Right;   CATARACT EXTRACTION  08/2010   left    CATARACT EXTRACTION W/PHACO  06/01/2011   Procedure: CATARACT EXTRACTION PHACO AND INTRAOCULAR LENS PLACEMENT (IOC);  Surgeon: Anner Kill;  Location: [facility];  Service: Ophthalmology;  Laterality: Right;  CDE:9.62   CHOLECYSTECTOMY     COLONOSCOPY WITH PROPOFOL  N/A 07/06/2020   Procedure: COLONOSCOPY WITH PROPOFOL ;  Surgeon: [name], DO;  Location: [facility] ENDOSCOPY;  Service: Endoscopy;  Laterality: N/A;  11:00am   EYE SURGERY     Cataract removal   RENAL ARTERY ANGIOPLASTY  2006   right   TUBAL LIGATION  1984   [facility]    Past Family History   Family History  Problem Relation Age of Onset   Cancer Mother        breast   Anxiety disorder Mother    Arthritis Mother    Depression Mother    Obesity Mother    Coronary artery disease Father        States congenital abnormality   Diabetes Father    Hyperlipidemia Father    Early death Father    CAD Sister        Stents    Cancer Sister        breast   Anxiety disorder Sister    Asthma Sister    Depression Sister    Obesity Sister    Cancer Maternal Grandmother        breast   Heart disease Maternal Grandfather    Cancer Maternal Aunt        breast   Anesthesia problems Neg Hx    Hypotension Neg Hx    Malignant hyperthermia Neg Hx    Pseudochol deficiency Neg Hx    Colon cancer Neg Hx    Liver disease Neg Hx     Past Social History   Social History   Socioeconomic History   Marital status: Divorced    Spouse name: Not on file   Number of children: Not on file   Years of  education: Not on file   Highest education level: GED or equivalent  Occupational History   Not on file  Tobacco Use   Smoking status: Every Day    Current packs/day: 0.00  Average packs/day: 1 pack/day for 40.0 years (40.0 ttl pk-yrs)    Types: Cigarettes    Start date: 11/01/1981    Last attempt to quit: 11/01/2021    Years since quitting: 1.9   Smokeless tobacco: Never   Tobacco comments:    Quit 11/15/21  Vaping Use   Vaping status: Never Used  Substance and Sexual Activity   Alcohol use: No   Drug use: Yes    Frequency: 0.5 times per week    Types: Marijuana    Comment: daily   Sexual activity: Not Currently    Birth control/protection: Abstinence, Post-menopausal    Comment: tubal  Other Topics Concern   Not on file  Social History Narrative   The patient does not have any sex drive and has not had one for almost a year.   Her   husband thinks she does not love him anymore.  He does not seem to understand   her   depression.   Social Drivers of Corporate investment banker Strain: Low Risk  (07/03/2023)   Overall Financial Resource Strain (CARDIA)    Difficulty of Paying Living Expenses: Not hard at all  Food Insecurity: Food Insecurity Present (07/03/2023)   Hunger Vital Sign    Worried About Running Out of Food in the Last Year: Sometimes true    Ran Out of Food in the Last Year: Never true  Transportation Needs: No Transportation Needs (07/03/2023)   PRAPARE - Administrator, Civil Service (Medical): No    Lack of Transportation (Non-Medical): No  Physical Activity: Insufficiently Active (07/03/2023)   Exercise Vital Sign    Days of Exercise per Week: 3 days    Minutes of Exercise per Session: 30 min  Stress: No Stress Concern Present (07/03/2023)   [employer] of Occupational Health - Occupational Stress Questionnaire    Feeling of Stress : Only a little  Social Connections: Socially Isolated (07/03/2023)   Social Connection and Isolation  Panel [NHANES]    Frequency of Communication with Friends and Family: Three times a week    Frequency of Social Gatherings with Friends and Family: Once a week    Attends Religious Services: Never    Database administrator or Organizations: No    Attends Banker Meetings: Never    Marital Status: Divorced  Catering manager Violence: Not At Risk (07/03/2023)   Humiliation, Afraid, Rape, and Kick questionnaire    Fear of Current or Ex-Partner: No    Emotionally Abused: No    Physically Abused: No    Sexually Abused: No    Review of Systems   General: Negative for anorexia, weight loss, fever, chills, fatigue, weakness. ENT: Negative for hoarseness, difficulty swallowing , nasal congestion. CV: Negative for chest pain, angina, palpitations, dyspnea on exertion, peripheral edema.  Respiratory: Negative for dyspnea at rest, dyspnea on exertion, cough, sputum, wheezing.  GI: See history of present illness. GU:  Negative for dysuria, hematuria, urinary incontinence, urinary frequency, nocturnal urination.  Endo: Negative for unusual weight change.     Physical Exam   LMP 05/25/2011    General: Well-nourished, well-developed in no acute distress.  Eyes: No icterus. Mouth: Oropharyngeal mucosa moist and pink , no lesions erythema or exudate. Lungs: Clear to auscultation bilaterally.  Heart: Regular rate and rhythm, no murmurs rubs or gallops.  Abdomen: Bowel sounds are normal, nontender, nondistended, no hepatosplenomegaly or masses,  no abdominal bruits or hernia , no rebound or guarding.  Rectal: ***  Extremities: No lower extremity edema. No clubbing or deformities. Neuro: Alert and oriented x 4   Skin: Warm and dry, no jaundice.   Psych: Alert and cooperative, normal mood and affect.  Labs   *** Imaging Studies   No results found.  Assessment       PLAN   ***   Trudie Fuse. Harles Lied, MHS, PA-C [facility]

## 2023-10-08 ENCOUNTER — Encounter: Payer: Self-pay | Admitting: *Deleted

## 2023-10-08 ENCOUNTER — Ambulatory Visit: Admitting: Gastroenterology

## 2023-10-08 ENCOUNTER — Encounter: Payer: Self-pay | Admitting: Gastroenterology

## 2023-10-08 VITALS — BP 128/64 | HR 64 | Temp 98.2°F | Ht 64.0 in | Wt 206.4 lb

## 2023-10-08 DIAGNOSIS — R1310 Dysphagia, unspecified: Secondary | ICD-10-CM | POA: Insufficient documentation

## 2023-10-08 DIAGNOSIS — R1319 Other dysphagia: Secondary | ICD-10-CM

## 2023-10-08 DIAGNOSIS — K7460 Unspecified cirrhosis of liver: Secondary | ICD-10-CM | POA: Diagnosis not present

## 2023-10-08 NOTE — Patient Instructions (Signed)
 Liver ultrasound to be scheduled.  Upper endoscopy to be scheduled.  Complete labs at [facility] at your convenience.   Try using a different probiotic then you did before. Use for at least one month to see if helps with gas.

## 2023-10-09 LAB — COMPREHENSIVE METABOLIC PANEL WITH GFR
ALT: 19 IU/L (ref 0–32)
AST: 22 IU/L (ref 0–40)
Albumin: 3.8 g/dL — ABNORMAL LOW (ref 3.9–4.9)
Alkaline Phosphatase: 98 IU/L (ref 44–121)
BUN/Creatinine Ratio: 6 — ABNORMAL LOW (ref 12–28)
BUN: 5 mg/dL — ABNORMAL LOW (ref 8–27)
Bilirubin Total: 0.4 mg/dL (ref 0.0–1.2)
CO2: 30 mmol/L — ABNORMAL HIGH (ref 20–29)
Calcium: 8.9 mg/dL (ref 8.7–10.3)
Chloride: 90 mmol/L — ABNORMAL LOW (ref 96–106)
Creatinine, Ser: 0.82 mg/dL (ref 0.57–1.00)
Globulin, Total: 2.5 g/dL (ref 1.5–4.5)
Glucose: 82 mg/dL (ref 70–99)
Potassium: 4.1 mmol/L (ref 3.5–5.2)
Sodium: 134 mmol/L (ref 134–144)
Total Protein: 6.3 g/dL (ref 6.0–8.5)
eGFR: 80 mL/min/{1.73_m2} (ref >59)

## 2023-10-09 LAB — CBC WITH DIFFERENTIAL/PLATELET
Basophils Absolute: 0 10*3/uL (ref 0.0–0.2)
Basos: 1 %
EOS (ABSOLUTE): 0.1 10*3/uL (ref 0.0–0.4)
Eos: 1 %
Hematocrit: 44.3 % (ref 34.0–46.6)
Hemoglobin: 15.2 g/dL (ref 11.1–15.9)
Immature Grans (Abs): 0 10*3/uL (ref 0.0–0.1)
Immature Granulocytes: 0 %
Lymphocytes Absolute: 2.4 10*3/uL (ref 0.7–3.1)
Lymphs: 29 %
MCH: 30.5 pg (ref 26.6–33.0)
MCHC: 34.3 g/dL (ref 31.5–35.7)
MCV: 89 fL (ref 79–97)
Monocytes Absolute: 0.6 10*3/uL (ref 0.1–0.9)
Monocytes: 7 %
Neutrophils Absolute: 5.1 10*3/uL (ref 1.4–7.0)
Neutrophils: 62 %
Platelets: 212 10*3/uL (ref 150–450)
RBC: 4.98 x10E6/uL (ref 3.77–5.28)
RDW: 12.6 % (ref 11.7–15.4)
WBC: 8.2 10*3/uL (ref 3.4–10.8)

## 2023-10-09 LAB — PROTIME-INR
INR: 1 (ref 0.9–1.2)
Prothrombin Time: 10.9 s (ref 9.1–12.0)

## 2023-10-09 LAB — AFP TUMOR MARKER: AFP, Serum, Tumor Marker: 5.1 ng/mL (ref 0.0–9.2)

## 2023-10-16 ENCOUNTER — Encounter: Payer: Medicare Other | Admitting: Internal Medicine

## 2023-10-18 ENCOUNTER — Ambulatory Visit: Admitting: Nurse Practitioner

## 2023-10-18 ENCOUNTER — Encounter: Payer: Self-pay | Admitting: Nurse Practitioner

## 2023-10-19 ENCOUNTER — Encounter (HOSPITAL_COMMUNITY): Payer: Self-pay

## 2023-10-19 ENCOUNTER — Ambulatory Visit (HOSPITAL_COMMUNITY)

## 2023-10-31 NOTE — Patient Instructions (Signed)
 Jamie Mcgee  10/31/2023     @PREFPERIOPPHARMACY @   Your procedure is scheduled on  11/05/2023.   Report to [facility] at  [phone]  A.M.   Call this number if you have problems the morning of surgery:  [phone]  If you experience any cold or flu symptoms such as cough, fever, chills, shortness of breath, etc. between now and your scheduled surgery, please notify us  at the above number.   Remember:        Use your inhalers before you come and bring your rescue inhaler with you.   Follow the diet instructions given to you by the office.   You may drink clear liquids until  0400 am on 11/05/2023.    Clear liquids allowed are:                    Water, Juice (No red color; non-citric and without pulp; diabetics please choose diet or no sugar options), Carbonated beverages (diabetics please choose diet or no sugar options), Clear Tea (No creamer, milk, or cream, including half & half and powdered creamer), Black Coffee Only (No creamer, milk or cream, including half & half and powdered creamer), and Clear Sports drink (No red color; diabetics please choose diet or no sugar options)    Take these medicines the morning of surgery with A SIP OF WATER                         buspirone , carvedilol , citalopram , pantoprazole .    Do not wear jewelry, make-up or nail polish, including gel polish,  artificial nails, or any other type of covering on natural nails (fingers and  toes).  Do not wear lotions, powders, or perfumes, or deodorant.  Do not shave 48 hours prior to surgery.  Men may shave face and neck.  Do not bring valuables to the hospital.  [facility] is not responsible for any belongings or valuables.  Contacts, dentures or bridgework may not be worn into surgery.  Leave your suitcase in the car.  After surgery it may be brought to your room.  For patients admitted to the hospital, discharge time will be determined by your treatment team.  Patients discharged the  day of surgery will not be allowed to drive home and must have someone with them for 24 hours.    Special instructions:   DO NOT smoke tobacco or vape for 24 hours before your procedure.  Please read over the following fact sheets that you were given. Anesthesia Post-op Instructions and Care and Recovery After Surgery      Upper Endoscopy, Adult, Care After After the procedure, it is common to have a sore throat. It is also common to have: Mild stomach pain or discomfort. Bloating. Nausea. Follow these instructions at home: The instructions below may help you care for yourself at home. Your health care provider may give you more instructions. If you have questions, ask your health care provider. If you were given a sedative during the procedure, it can affect you for several hours. Do not drive or operate machinery until your health care provider says that it is safe. If you will be going home right after the procedure, plan to have a responsible adult: Take you home from the hospital or clinic. You will not be allowed to drive. Care for you for the time you are told. Follow instructions from your health  care provider about what you may eat and drink. Return to your normal activities as told by your health care provider. Ask your health care provider what activities are safe for you. Take over-the-counter and prescription medicines only as told by your health care provider. Contact a health care provider if you: Have a sore throat that lasts longer than one day. Have trouble swallowing. Have a fever. Get help right away if you: Vomit blood or your vomit looks like coffee grounds. Have bloody, black, or tarry stools. Have a very bad sore throat or you cannot swallow. Have difficulty breathing or very bad pain in your chest or abdomen. These symptoms may be an emergency. Get help right away. Call 911. Do not wait to see if the symptoms will go away. Do not drive yourself to the  hospital. Summary After the procedure, it is common to have a sore throat, mild stomach discomfort, bloating, and nausea. If you were given a sedative during the procedure, it can affect you for several hours. Do not drive until your health care provider says that it is safe. Follow instructions from your health care provider about what you may eat and drink. Return to your normal activities as told by your health care provider. This information is not intended to replace advice given to you by your health care provider. Make sure you discuss any questions you have with your health care provider. Document Revised: 08/31/2021 Document Reviewed: 08/31/2021 Elsevier Patient Education  2024 Elsevier Inc.Esophageal Dilatation Esophageal dilatation, or dilation, is done to stretch a blocked or narrowed part of your esophagus. The esophagus is the part of your body that moves food from your mouth to your stomach. You may need to have it stretched if: You have a lot of scar tissue and it makes it hard or painful to swallow. You have cancer of the esophagus. There's a problem with how food moves through your esophagus. In some cases, you may need to have this procedure done more than once. Tell a health care provider about: Any allergies you have. All medicines you're taking, including vitamins, herbs, eye drops, creams, and over-the-counter medicines. Any problems you or family members have had with anesthesia. Any bleeding problems you have. Any surgeries you've had. Any medical conditions you have. Whether you're pregnant or may be pregnant. What are the risks? Your health care provider will talk with you about risks. These may include: Bleeding. A hole or tear in your esophagus. What happens before the procedure? When to stop eating and drinking Follow instructions from your provider about what you may eat and drink. These may include: 8 hours before your procedure Stop eating most foods.  Do not eat meat, fried foods, or fatty foods. Eat only light foods, such as toast or crackers. All liquids are okay except energy drinks and alcohol. 6 hours before your procedure Stop eating. Drink only clear liquids, such as water, clear fruit juice, black coffee, plain tea, and sports drinks. Do not drink energy drinks or alcohol. 2 hours before your procedure Stop drinking all liquids. You may be allowed to take medicines with small sips of water. If you don't follow your provider's instructions, your procedure may be delayed or canceled. Medicines Ask your provider about: Changing or stopping your regular medicines. These include any diabetes medicines or blood thinners you take. Taking medicines such as aspirin  and ibuprofen . These medicines can thin your blood. Do not take them unless your provider tells you to. Taking over-the-counter medicines,  vitamins, herbs, and supplements. General instructions If you'll be going home right after the procedure, plan to have a responsible adult: Take you home from the hospital or clinic. You won't be allowed to drive. Care for you for the time you're told. What happens during the procedure? You may be given: A sedative. This helps you relax. Anesthesia. This keeps you from feeling pain. It will numb certain areas of your body. The stretching may be done with: Simple dilators. These are tools put in your esophagus to stretch it. Guide wires. These wires are put in using a tube called an endoscope. A dilator is put over the wires to stretch your esophagus. Then the wires are taken out. A balloon. The balloon is on the end of a tube. It's inflated to stretch your esophagus. The procedure may vary among providers and hospitals. What can I expect after the procedure? Your blood pressure, heart rate, breathing rate, and blood oxygen  level will be monitored until you leave the hospital or clinic. Your throat may feel sore and numb. This will get  better over time. You won't be allowed to eat or drink until your throat is no longer numb. You may be able to go home when you can: Drink. Pee. Sit on the edge of the bed without nausea or dizziness. Follow these instructions at home: Activity If you were given a sedative during the procedure, it can affect you for several hours. Do not drive or operate machinery until your provider says it's safe. Return to your normal activities as told by your provider. Ask your provider what activities are safe for you. General instructions Take over-the-counter and prescription medicines only as told by your provider. Follow instructions from your provider about what you may eat and drink. Do not use any products that contain nicotine  or tobacco. These products include cigarettes, chewing tobacco, and vaping devices, such as e-cigarettes. If you need help quitting, ask your provider. Keep all follow-up visits. Your provider will make sure the procedure worked. Where to find more information American Society for Gastrointestinal Endoscopy (ASGE): asge.org Contact a health care provider if: You have trouble swallowing. You have a fever. Your pain doesn't get better with medicine. Get help right away if: You have chest pain. You have trouble breathing. You vomit blood. Your poop is: Black. Tarry. Bloody. These symptoms may be an emergency. Get help right away. Call 911. Do not wait to see if the symptoms will go away. Do not drive yourself to the hospital. This information is not intended to replace advice given to you by your health care provider. Make sure you discuss any questions you have with your health care provider. Document Revised: 08/18/2022 Document Reviewed: 08/18/2022 Elsevier Patient Education  2024 Elsevier Inc.General Anesthesia, Adult, Care After The following information offers guidance on how to care for yourself after your procedure. Your health care provider may also give  you more specific instructions. If you have problems or questions, contact your health care provider. What can I expect after the procedure? After the procedure, it is common for people to: Have pain or discomfort at the IV site. Have nausea or vomiting. Have a sore throat or hoarseness. Have trouble concentrating. Feel cold or chills. Feel weak, sleepy, or tired (fatigue). Have soreness and body aches. These can affect parts of the body that were not involved in surgery. Follow these instructions at home: For the time period you were told by your health care provider:  Rest. Do not  participate in activities where you could fall or become injured. Do not drive or use machinery. Do not drink alcohol. Do not take sleeping pills or medicines that cause drowsiness. Do not make important decisions or sign legal documents. Do not take care of children on your own. General instructions Drink enough fluid to keep your urine pale yellow. If you have sleep apnea, surgery and certain medicines can increase your risk for breathing problems. Follow instructions from your health care provider about wearing your sleep device: Anytime you are sleeping, including during daytime naps. While taking prescription pain medicines, sleeping medicines, or medicines that make you drowsy. Return to your normal activities as told by your health care provider. Ask your health care provider what activities are safe for you. Take over-the-counter and prescription medicines only as told by your health care provider. Do not use any products that contain nicotine  or tobacco. These products include cigarettes, chewing tobacco, and vaping devices, such as e-cigarettes. These can delay incision healing after surgery. If you need help quitting, ask your health care provider. Contact a health care provider if: You have nausea or vomiting that does not get better with medicine. You vomit every time you eat or drink. You have  pain that does not get better with medicine. You cannot urinate or have bloody urine. You develop a skin rash. You have a fever. Get help right away if: You have trouble breathing. You have chest pain. You vomit blood. These symptoms may be an emergency. Get help right away. Call 911. Do not wait to see if the symptoms will go away. Do not drive yourself to the hospital. Summary After the procedure, it is common to have a sore throat, hoarseness, nausea, vomiting, or to feel weak, sleepy, or fatigue. For the time period you were told by your health care provider, do not drive or use machinery. Get help right away if you have difficulty breathing, have chest pain, or vomit blood. These symptoms may be an emergency. This information is not intended to replace advice given to you by your health care provider. Make sure you discuss any questions you have with your health care provider. Document Revised: 08/19/2021 Document Reviewed: 08/19/2021 Elsevier Patient Education  2024 [employer].

## 2023-11-01 DIAGNOSIS — H524 Presbyopia: Secondary | ICD-10-CM | POA: Diagnosis not present

## 2023-11-01 DIAGNOSIS — H40051 Ocular hypertension, right eye: Secondary | ICD-10-CM | POA: Diagnosis not present

## 2023-11-02 ENCOUNTER — Encounter (HOSPITAL_COMMUNITY)
Admission: RE | Admit: 2023-11-02 | Discharge: 2023-11-02 | Disposition: A | Discharge status: Home / Self Care | Source: Ambulatory Visit | Attending: Internal Medicine | Admitting: Internal Medicine

## 2023-11-02 ENCOUNTER — Encounter (HOSPITAL_COMMUNITY): Payer: Self-pay

## 2023-11-02 ENCOUNTER — Other Ambulatory Visit: Payer: Self-pay

## 2023-11-02 VITALS — BP 124/86 | HR 69 | Temp 98.0°F | Resp 18 | Ht 64.0 in | Wt 206.4 lb

## 2023-11-02 DIAGNOSIS — I10 Essential (primary) hypertension: Secondary | ICD-10-CM | POA: Insufficient documentation

## 2023-11-02 DIAGNOSIS — Z01810 Encounter for preprocedural cardiovascular examination: Secondary | ICD-10-CM | POA: Diagnosis not present

## 2023-11-05 ENCOUNTER — Encounter (HOSPITAL_COMMUNITY)
Admission: RE | Disposition: A | Discharge status: Home / Self Care | Payer: Self-pay | Source: Home / Self Care | Attending: Internal Medicine

## 2023-11-05 ENCOUNTER — Ambulatory Visit (HOSPITAL_BASED_OUTPATIENT_CLINIC_OR_DEPARTMENT_OTHER): Admitting: Anesthesiology

## 2023-11-05 ENCOUNTER — Ambulatory Visit (HOSPITAL_COMMUNITY)
Admission: RE | Admit: 2023-11-05 | Discharge: 2023-11-05 | Disposition: A | Discharge status: Home / Self Care | Attending: Internal Medicine | Admitting: Internal Medicine

## 2023-11-05 ENCOUNTER — Ambulatory Visit (HOSPITAL_COMMUNITY): Admitting: Anesthesiology

## 2023-11-05 ENCOUNTER — Encounter (HOSPITAL_COMMUNITY): Payer: Self-pay | Admitting: Internal Medicine

## 2023-11-05 DIAGNOSIS — I10 Essential (primary) hypertension: Secondary | ICD-10-CM | POA: Diagnosis not present

## 2023-11-05 DIAGNOSIS — K7460 Unspecified cirrhosis of liver: Secondary | ICD-10-CM | POA: Diagnosis not present

## 2023-11-05 DIAGNOSIS — K222 Esophageal obstruction: Secondary | ICD-10-CM | POA: Diagnosis not present

## 2023-11-05 DIAGNOSIS — G4730 Sleep apnea, unspecified: Secondary | ICD-10-CM | POA: Diagnosis not present

## 2023-11-05 DIAGNOSIS — F1290 Cannabis use, unspecified, uncomplicated: Secondary | ICD-10-CM | POA: Insufficient documentation

## 2023-11-05 DIAGNOSIS — F17200 Nicotine dependence, unspecified, uncomplicated: Secondary | ICD-10-CM | POA: Diagnosis not present

## 2023-11-05 DIAGNOSIS — K648 Other hemorrhoids: Secondary | ICD-10-CM | POA: Diagnosis not present

## 2023-11-05 DIAGNOSIS — J439 Emphysema, unspecified: Secondary | ICD-10-CM | POA: Diagnosis not present

## 2023-11-05 DIAGNOSIS — K2970 Gastritis, unspecified, without bleeding: Secondary | ICD-10-CM | POA: Insufficient documentation

## 2023-11-05 DIAGNOSIS — J449 Chronic obstructive pulmonary disease, unspecified: Secondary | ICD-10-CM | POA: Diagnosis not present

## 2023-11-05 DIAGNOSIS — F17210 Nicotine dependence, cigarettes, uncomplicated: Secondary | ICD-10-CM | POA: Diagnosis not present

## 2023-11-05 DIAGNOSIS — K219 Gastro-esophageal reflux disease without esophagitis: Secondary | ICD-10-CM | POA: Diagnosis not present

## 2023-11-05 DIAGNOSIS — R1310 Dysphagia, unspecified: Secondary | ICD-10-CM | POA: Insufficient documentation

## 2023-11-05 HISTORY — PX: ESOPHAGOGASTRODUODENOSCOPY: SHX5428

## 2023-11-05 HISTORY — PX: ESOPHAGEAL DILATION: SHX303

## 2023-11-05 SURGERY — EGD (ESOPHAGOGASTRODUODENOSCOPY)
Anesthesia: General

## 2023-11-05 MED ORDER — PROPOFOL 10 MG/ML IV BOLUS
INTRAVENOUS | Status: DC | PRN
  Administered 2023-11-05: 30 mg via INTRAVENOUS
  Administered 2023-11-05: 120 mg via INTRAVENOUS
  Administered 2023-11-05: 50 mg via INTRAVENOUS

## 2023-11-05 MED ORDER — LACTATED RINGERS IV SOLN: INTRAVENOUS | Status: DC

## 2023-11-05 MED ORDER — LIDOCAINE 2% (20 MG/ML) 5 ML SYRINGE
INTRAMUSCULAR | Status: DC | PRN
  Administered 2023-11-05: 100 mg via INTRAVENOUS

## 2023-11-05 NOTE — Anesthesia Preprocedure Evaluation (Signed)
 Anesthesia Evaluation  Patient identified by MRN, date of birth, ID band Patient awake    Reviewed: Allergy & Precautions, H&P , NPO status , Patient's Chart, lab work & pertinent test results, reviewed documented beta blocker date and time   Airway Mallampati: II  TM Distance: >3 FB Neck ROM: full    Dental no notable dental hx.    Pulmonary sleep apnea , COPD, Current Smoker   Pulmonary exam normal breath sounds clear to auscultation       Cardiovascular Exercise Tolerance: Good hypertension,  Rhythm:regular Rate:Normal     Neuro/Psych  Headaches PSYCHIATRIC DISORDERS Anxiety Depression     Neuromuscular disease    GI/Hepatic ,GERD  ,,(+) Hepatitis -  Endo/Other  negative endocrine ROS    Renal/GU negative Renal ROS  negative genitourinary   Musculoskeletal   Abdominal   Peds  Hematology negative hematology ROS (+)   Anesthesia Other Findings   Reproductive/Obstetrics negative OB ROS                             Anesthesia Physical Anesthesia Plan  ASA: 3  Anesthesia Plan: General   Post-op Pain Management:    Induction:   PONV Risk Score and Plan: Propofol  infusion  Airway Management Planned:   Additional Equipment:   Intra-op Plan:   Post-operative Plan:   Informed Consent: I have reviewed the patients History and Physical, chart, labs and discussed the procedure including the risks, benefits and alternatives for the proposed anesthesia with the patient or authorized representative who has indicated his/her understanding and acceptance.     Dental Advisory Given  Plan Discussed with: CRNA  Anesthesia Plan Comments:        Anesthesia Quick Evaluation

## 2023-11-05 NOTE — Anesthesia Procedure Notes (Addendum)
 Date/Time: 11/05/2023 7:53 AM  Performed by: Sherwin Donate, CRNAPre-anesthesia Checklist: Patient identified, Suction available, Patient being monitored and Emergency Drugs available Patient Re-evaluated:Patient Re-evaluated prior to induction Oxygen  Delivery Method: Nasal cannula Induction Type: IV induction Placement Confirmation: positive ETCO2 Comments: Optiflow High Flow [place] O2 used.

## 2023-11-05 NOTE — Op Note (Signed)
 [facility] Patient Name: Jamie Mcgee Procedure Date: 11/05/2023 7:44 AM MRN: [id number] Date of Birth: [birth year] Attending MD: Rolando Cliche. Mordechai April , [place], [id number] CSN: [id number] Age: 63 Admit Type: Outpatient Procedure:                Upper GI endoscopy Indications:              Dysphagia, Cirrhosis rule out esophageal varices Providers:                Rolando Cliche. Mordechai April, DO, Vonna Guardian, Ruthine Cowper Technician, Technician Referring MD:              Medicines:                See the Anesthesia note for documentation of the                            administered medications Complications:            No immediate complications. Estimated Blood Loss:     Estimated blood loss was minimal. Procedure:                Pre-Anesthesia Assessment:                           - The anesthesia plan was to use monitored                            anesthesia care (MAC).                           After obtaining informed consent, the endoscope was                            passed under direct vision. Throughout the                            procedure, the patient's blood pressure, pulse, and                            oxygen  saturations were monitored continuously. The                            GIF-H190 ([id number]) scope was introduced through the                            mouth, and advanced to the second part of duodenum.                            The upper GI endoscopy was accomplished without                            difficulty. The patient tolerated the procedure  well. Scope In: 7:59:38 AM Scope Out: 8:05:53 AM Total Procedure Duration: 0 hours 6 minutes 15 seconds  Findings:      A mild Schatzki ring was found in the lower third of the esophagus. A       TTS dilator was passed through the scope. Dilation with an 18-19-20 mm       balloon dilator was performed to 20 mm. The dilation site was examined       and  showed mild mucosal disruption and moderate improvement in luminal       narrowing.      There is no endoscopic evidence of varices in the entire esophagus.      Diffuse mild inflammation characterized by erythema was found in the       entire examined stomach. Biopsies were taken with a cold forceps for       Helicobacter pylori testing.      The duodenal bulb, first portion of the duodenum and second portion of       the duodenum were normal. Impression:               - Mild Schatzki ring. Dilated.                           - Gastritis. Biopsied.                           - Normal duodenal bulb, first portion of the                            duodenum and second portion of the duodenum. Moderate Sedation:      Per Anesthesia Care Recommendation:           - Patient has a contact number available for                            emergencies. The signs and symptoms of potential                            delayed complications were discussed with the                            patient. Return to normal activities tomorrow.                            Written discharge instructions were provided to the                            patient.                           - Resume previous diet.                           - Continue present medications.                           - Await pathology results.                           -  Repeat upper endoscopy PRN for retreatment.                           - Return to GI clinic in 2 months. Procedure Code(s):        --- Professional ---                           [phone], Esophagogastroduodenoscopy, flexible,                            transoral; with transendoscopic balloon dilation of                            esophagus (less than 30 mm diameter)                           [id number], 59, Esophagogastroduodenoscopy, flexible,                            transoral; with biopsy, single or multiple Diagnosis Code(s):        --- Professional ---                            K22.2, Esophageal obstruction                           K29.70, Gastritis, unspecified, without bleeding                           R13.10, Dysphagia, unspecified                           K74.60, Unspecified cirrhosis of liver CPT copyright 2022 American Medical Association. All rights reserved. The codes documented in this report are preliminary and upon coder review may  be revised to meet current compliance requirements. Rolando Cliche. Mordechai April, DO Rolando Cliche. Georgi Tuel, DO 11/05/2023 8:10:18 AM This report has been signed electronically. Number of Addenda: 0

## 2023-11-05 NOTE — Interval H&P Note (Signed)
 History and Physical Interval Note:  11/05/2023 7:46 AM  Jamie Mcgee  has presented today for surgery, with the diagnosis of dysphagia, cirrhosis.  The various methods of treatment have been discussed with the patient and family. After consideration of risks, benefits and other options for treatment, the patient has consented to  Procedure(s) with comments: EGD (ESOPHAGOGASTRODUODENOSCOPY) (N/A) - 8:00 am, asa 3 DILATION, ESOPHAGUS (N/A) as a surgical intervention.  The patient's history has been reviewed, patient examined, no change in status, stable for surgery.  I have reviewed the patient's chart and labs.  Questions were answered to the patient's satisfaction.     [name]

## 2023-11-05 NOTE — Transfer of Care (Signed)
 Immediate Anesthesia Transfer of Care Note  Patient: Jamie Mcgee  Procedure(s) Performed: EGD (ESOPHAGOGASTRODUODENOSCOPY) DILATION, ESOPHAGUS  Patient Location: Short Stay  Anesthesia Type:General  Level of Consciousness: drowsy  Airway & Oxygen  Therapy: Patient Spontanous Breathing  Post-op Assessment: Report given to RN and Post -op Vital signs reviewed and stable  Post vital signs: Reviewed and stable  Last Vitals:  Vitals Value Taken Time  BP 83/47 11/05/23 0809  Temp 36.4 C 11/05/23 0809  Pulse 62 11/05/23 0809  Resp 12 11/05/23 0809  SpO2 98 % 11/05/23 0809    Last Pain:  Vitals:   11/05/23 0809  TempSrc: Oral  PainSc:          Complications: No notable events documented.

## 2023-11-05 NOTE — Discharge Instructions (Addendum)
 EGD Discharge instructions Please read the instructions outlined below and refer to this sheet in the next few weeks. These discharge instructions provide you with general information on caring for yourself after you leave the hospital. Your doctor may also give you specific instructions. While your treatment has been planned according to the most current medical practices available, unavoidable complications occasionally occur. If you have any problems or questions after discharge, please call your doctor. ACTIVITY You may resume your regular activity but move at a slower pace for the next 24 hours.  Take frequent rest periods for the next 24 hours.  Walking will help expel (get rid of) the air and reduce the bloated feeling in your abdomen.  No driving for 24 hours (because of the anesthesia (medicine) used during the test).  You may shower.  Do not sign any important legal documents or operate any machinery for 24 hours (because of the anesthesia used during the test).  NUTRITION Drink plenty of fluids.  You may resume your normal diet.  Begin with a light meal and progress to your normal diet.  Avoid alcoholic beverages for 24 hours or as instructed by your caregiver.  MEDICATIONS You may resume your normal medications unless your caregiver tells you otherwise.  WHAT YOU CAN EXPECT TODAY You may experience abdominal discomfort such as a feeling of fullness or "gas" pains.  FOLLOW-UP Your doctor will discuss the results of your test with you.  SEEK IMMEDIATE MEDICAL ATTENTION IF ANY OF THE FOLLOWING OCCUR: Excessive nausea (feeling sick to your stomach) and/or vomiting.  Severe abdominal pain and distention (swelling).  Trouble swallowing.  Temperature over 101 F (37.8 C).  Rectal bleeding or vomiting of blood.    Your EGD revealed mild amount inflammation in your stomach.  I took biopsies of this to rule out infection with a bacteria called H. pylori.  Await pathology results, my  office will contact you.  You did have a mild tightening of your esophagus.  I stretched this out today.  Hopefully this helps with feeling of food getting stuck.  No evidence of esophageal varices today.  Small bowel appeared normal.  Continue on pantoprazole  daily.  Follow-up in GI office in 2 to 3 months.  I hope you have a great rest of your week!  Rolando Cliche. Mordechai April, D.O. Gastroenterology and Hepatology [facility]

## 2023-11-06 ENCOUNTER — Encounter (HOSPITAL_COMMUNITY): Payer: Self-pay | Admitting: Internal Medicine

## 2023-11-06 LAB — SURGICAL PATHOLOGY

## 2023-11-07 ENCOUNTER — Ambulatory Visit (HOSPITAL_COMMUNITY)
Admission: RE | Admit: 2023-11-07 | Discharge: 2023-11-07 | Disposition: A | Discharge status: Home / Self Care | Source: Ambulatory Visit | Attending: Gastroenterology | Admitting: Gastroenterology

## 2023-11-07 DIAGNOSIS — R1319 Other dysphagia: Secondary | ICD-10-CM | POA: Insufficient documentation

## 2023-11-07 DIAGNOSIS — Z1289 Encounter for screening for malignant neoplasm of other sites: Secondary | ICD-10-CM | POA: Diagnosis not present

## 2023-11-07 DIAGNOSIS — K7689 Other specified diseases of liver: Secondary | ICD-10-CM | POA: Diagnosis not present

## 2023-11-07 DIAGNOSIS — K7460 Unspecified cirrhosis of liver: Secondary | ICD-10-CM | POA: Diagnosis not present

## 2023-11-08 ENCOUNTER — Ambulatory Visit: Attending: Internal Medicine | Admitting: Internal Medicine

## 2023-11-08 ENCOUNTER — Ambulatory Visit: Payer: Self-pay | Admitting: Internal Medicine

## 2023-11-08 ENCOUNTER — Encounter: Payer: Self-pay | Admitting: Internal Medicine

## 2023-11-08 VITALS — BP 126/84 | HR 73 | Resp 16 | Ht 65.0 in | Wt 203.0 lb

## 2023-11-08 DIAGNOSIS — Z9889 Other specified postprocedural states: Secondary | ICD-10-CM | POA: Diagnosis not present

## 2023-11-08 DIAGNOSIS — R200 Anesthesia of skin: Secondary | ICD-10-CM | POA: Diagnosis not present

## 2023-11-08 DIAGNOSIS — R768 Other specified abnormal immunological findings in serum: Secondary | ICD-10-CM | POA: Insufficient documentation

## 2023-11-08 DIAGNOSIS — K7460 Unspecified cirrhosis of liver: Secondary | ICD-10-CM | POA: Diagnosis not present

## 2023-11-08 DIAGNOSIS — R202 Paresthesia of skin: Secondary | ICD-10-CM | POA: Diagnosis not present

## 2023-11-08 NOTE — Progress Notes (Unsigned)
 Office Visit Note  Patient: Jamie Mcgee             Date of Birth: 10/02/1960           MRN: [id number]             PCP: Eliodoro Guerin, DO Referring: Artelia Laroche Visit Date: 11/08/2023 Occupation: @GUAROCC @  Subjective:  No chief complaint on file.   History of Present Illness: Jamie Mcgee is a 63 y.o. female ***     Activities of Daily Living:  Patient reports morning stiffness for *** {minute/hour:19697}.   Patient {ACTIONS;DENIES/REPORTS:21021675::"Denies"} nocturnal pain.  Difficulty dressing/grooming: {ACTIONS;DENIES/REPORTS:21021675::"Denies"} Difficulty climbing stairs: {ACTIONS;DENIES/REPORTS:21021675::"Denies"} Difficulty getting out of chair: {ACTIONS;DENIES/REPORTS:21021675::"Denies"} Difficulty using hands for taps, buttons, cutlery, and/or writing: {ACTIONS;DENIES/REPORTS:21021675::"Denies"}  No Rheumatology ROS completed.   PMFS History:  Patient Active Problem List   Diagnosis Date Noted   Dysphagia 10/08/2023   Centrilobular emphysema (HCC) 02/09/2023   ASCUS with positive high risk HPV cervical 12/06/2022   Encounter for screening fecal occult blood testing 11/29/2022   Encounter for gynecological examination with Papanicolaou smear of cervix 11/29/2022   Cervical high risk HPV (human papillomavirus) test positive, normal cytology 11/29/2022   Hepatic cirrhosis (HCC) 09/05/2022   Leukocytosis 11/11/2021   DNI (do not intubate) 05/10/2021   Papanicolaou smear of cervix with positive high risk human papilloma virus (HPV) test 10/28/2020   PMB (postmenopausal bleeding) 10/28/2020   Hemorrhoids 05/18/2020   Moderate episode of recurrent major depressive disorder (HCC) 08/20/2018   Pure hypercholesterolemia 08/20/2018   Chronic bilateral low back pain with bilateral sciatica 02/15/2018   Numbness and tingling of foot 02/15/2018   OSA (obstructive sleep apnea) 01/18/2018   Hypersomnolence 12/28/2017   History of carpal tunnel  release 11/15/2017   History of fusion of cervical spine 10/18/2017   Panlobular emphysema (HCC) 07/12/2017   Osteopenia 11/22/2016   Pre-diabetes 12/25/2015   Morbid obesity (HCC) 12/25/2015   Cramp of both lower extremities 07/02/2015   GERD (gastroesophageal reflux disease) 05/07/2015   Vitamin D  deficiency 05/07/2015   Metabolic syndrome 05/07/2015   Post-menopausal bleeding 05/07/2015   Depressed 12/19/2013   Essential hypertension, benign 12/19/2013    Past Medical History:  Diagnosis Date   Allergy    Anxiety    Arthritis    Binge eating disorder    Carpal tunnel syndrome on right    Cataract    Both lenses been replaced   Cervical stenosis of spine    C5-6   COPD (chronic obstructive pulmonary disease) (HCC)    Gold III   Depression    Emphysema of lung (HCC)    Encounter for screening fecal occult blood testing 11/29/2022   Fibromuscular dysplasia of renal artery (HCC) 2006   GERD (gastroesophageal reflux disease)    Headache    migraines 20 years ago   Hepatitis    type A in 1977   HNP (herniated nucleus pulposus), cervical    HPV (human papilloma virus) infection    HTN (hypertension)    Neuropathy    Sleep apnea    Vitamin D  deficiency     Family History  Problem Relation Age of Onset   Cancer Mother        breast   Anxiety disorder Mother    Arthritis Mother    Depression Mother    Obesity Mother    Coronary artery disease Father        States congenital abnormality   Diabetes Father  Hyperlipidemia Father    Early death Father    CAD Sister        Stents    Cancer Sister        breast   Anxiety disorder Sister    Asthma Sister    Depression Sister    Obesity Sister    Cancer Maternal Grandmother        breast   Heart disease Maternal Grandfather    Cancer Maternal Aunt        breast   Anesthesia problems Neg Hx    Hypotension Neg Hx    Malignant hyperthermia Neg Hx    Pseudochol deficiency Neg Hx    Colon cancer Neg Hx    Liver  disease Neg Hx    Past Surgical History:  Procedure Laterality Date   ANTERIOR CERVICAL DECOMP/DISCECTOMY FUSION N/A 10/05/2017   Procedure: C5-6 ANTERIOR CERVICAL DECOMPRESSION/DISCECTOMY FUSION,  ALLOGRAFT, PLATE  (NECK FIRST AND CARPAL TUNNEL RELEASE SECOND);  Surgeon: Adah Acron, MD;  Location: [facility] OR;  Service: Orthopedics;  Laterality: N/A;   APPENDECTOMY  1993   BREAST BIOPSY Right    CARPAL TUNNEL RELEASE Right 10/05/2017   Procedure: RIGHT CARPAL TUNNEL RELEASE;  Surgeon: Adah Acron, MD;  Location: [facility] OR;  Service: Orthopedics;  Laterality: Right;   CATARACT EXTRACTION  08/2010   left    CATARACT EXTRACTION W/PHACO  06/01/2011   Procedure: CATARACT EXTRACTION PHACO AND INTRAOCULAR LENS PLACEMENT (IOC);  Surgeon: Anner Kill;  Location: [facility] ORS;  Service: Ophthalmology;  Laterality: Right;  CDE:9.62   CHOLECYSTECTOMY     COLONOSCOPY WITH PROPOFOL  N/A 07/06/2020   Procedure: COLONOSCOPY WITH PROPOFOL ;  Surgeon: [name], DO;  Location: [facility] ENDOSCOPY;  Service: Endoscopy;  Laterality: N/A;  11:00am   ESOPHAGEAL DILATION N/A 11/05/2023   Procedure: DILATION, ESOPHAGUS;  Surgeon: [name], DO;  Location: [facility] ENDO SUITE;  Service: Endoscopy;  Laterality: N/A;   ESOPHAGOGASTRODUODENOSCOPY N/A 11/05/2023   Procedure: EGD (ESOPHAGOGASTRODUODENOSCOPY);  Surgeon: [name], DO;  Location: [facility] ENDO [facility];  Service: Endoscopy;  Laterality: N/A;  8:00 am, asa 3   EYE SURGERY     Cataract removal   RENAL ARTERY ANGIOPLASTY  2006   right   TUBAL LIGATION  20   [facility]   Social History   Social History Narrative   The patient does not have any sex drive and has not had one for almost a year.   Her   husband thinks she does not love him anymore.  He does not seem to understand   her   depression.   Immunization History  Administered Date(s) Administered   Fluad Quad(high Dose 65+) 04/06/2022   Influenza,inj,Quad PF,6+ Mos 03/15/2018, 03/10/2020   Moderna  Sars-Covid-2 Vaccination 10/06/2019   PNEUMOCOCCAL CONJUGATE-20 04/13/2021   Pneumococcal Polysaccharide-23 01/04/2018   Tdap 01/04/2018   Zoster Recombinant(Shingrix) 10/11/2020, 03/24/2021     Objective: Vital Signs: LMP 05/25/2011    Physical Exam   Musculoskeletal Exam: ***  CDAI Exam: CDAI Score: -- Patient Global: --; Provider Global: -- Swollen: --; Tender: -- Joint Exam 11/08/2023   No joint exam has been documented for this visit   There is currently no information documented on the homunculus. Go to the Rheumatology activity and complete the homunculus joint exam.  Investigation: No additional findings.  Imaging: No results found.  Recent Labs: Lab Results  Component Value Date   WBC 8.2 10/08/2023   HGB 15.2 10/08/2023   PLT 212 10/08/2023  NA 134 10/08/2023   K 4.1 10/08/2023   CL 90 (L) 10/08/2023   CO2 30 (H) 10/08/2023   GLUCOSE 82 10/08/2023   BUN 5 (L) 10/08/2023   CREATININE 0.82 10/08/2023   BILITOT 0.4 10/08/2023   ALKPHOS 98 10/08/2023   AST 22 10/08/2023   ALT 19 10/08/2023   PROT 6.3 10/08/2023   ALBUMIN 3.8 (L) 10/08/2023   CALCIUM  8.9 10/08/2023   GFRAA 91 01/09/2020    Speciality Comments: No specialty comments available.  Procedures:  No procedures performed Allergies: Lipitor [atorvastatin ] and Naproxen    Assessment / Plan:     Visit Diagnoses: No diagnosis found.  Orders: No orders of the defined types were placed in this encounter.  No orders of the defined types were placed in this encounter.   Face-to-face time spent with patient was *** minutes. Greater than 50% of time was spent in counseling and coordination of care.  Follow-Up Instructions: No follow-ups on file.   Matt Song, MD  Note - This record has been created using [employer].  Chart creation errors have been sought, but may not always  have been located. Such creation errors do not reflect on  the standard of medical care.

## 2023-11-08 NOTE — Anesthesia Postprocedure Evaluation (Signed)
 Anesthesia Post Note  Patient: Jamie Mcgee  Procedure(s) Performed: EGD (ESOPHAGOGASTRODUODENOSCOPY) DILATION, ESOPHAGUS  Patient location during evaluation: Phase II Anesthesia Type: General Level of consciousness: awake Pain management: pain level controlled Vital Signs Assessment: post-procedure vital signs reviewed and stable Respiratory status: spontaneous breathing and respiratory function stable Cardiovascular status: blood pressure returned to baseline and stable Postop Assessment: no headache and no apparent nausea or vomiting Anesthetic complications: no Comments: Late entry   No notable events documented.   Last Vitals:  Vitals:   11/05/23 0809 11/05/23 0816  BP: (!) 83/47 (!) 94/50  Pulse: 62 62  Resp: 12 20  Temp: (!) 36.4 C   SpO2: 98% 99%    Last Pain:  Vitals:   11/06/23 1513  TempSrc:   PainSc: 0-No pain                 [name]

## 2023-11-10 LAB — CYCLIC CITRUL PEPTIDE ANTIBODY, IGG: Cyclic Citrullin Peptide Ab: <16 U

## 2023-11-10 LAB — RNP ANTIBODY: Ribonucleic Protein(ENA) Antibody, IgG: <1 AI

## 2023-11-10 LAB — RHEUMATOID FACTOR: Rheumatoid fact SerPl-aCnc: <10 [IU]/mL (ref <14)

## 2023-11-10 LAB — C3 AND C4
C3 Complement: 164 mg/dL (ref 83–193)
C4 Complement: 24 mg/dL (ref 15–57)

## 2023-11-10 LAB — SJOGRENS SYNDROME-A EXTRACTABLE NUCLEAR ANTIBODY: SSA (Ro) (ENA) Antibody, IgG: <1 AI

## 2023-11-10 LAB — SEDIMENTATION RATE: Sed Rate: 17 mm/h (ref 0–30)

## 2023-11-10 LAB — ANTI-SMITH ANTIBODY: ENA SM Ab Ser-aCnc: <1 AI

## 2023-11-10 LAB — ANTI-DNA ANTIBODY, DOUBLE-STRANDED: ds DNA Ab: <1 [IU]/mL

## 2023-11-16 ENCOUNTER — Ambulatory Visit: Payer: Self-pay | Admitting: Gastroenterology

## 2023-11-20 ENCOUNTER — Ambulatory Visit: Admitting: Family Medicine

## 2023-11-20 ENCOUNTER — Ambulatory Visit

## 2023-11-20 ENCOUNTER — Telehealth: Payer: Self-pay | Admitting: Family Medicine

## 2023-11-20 ENCOUNTER — Encounter: Payer: Self-pay | Admitting: Family Medicine

## 2023-11-20 VITALS — BP 124/77 | HR 73 | Temp 98.7°F | Ht 65.0 in | Wt 203.0 lb

## 2023-11-20 DIAGNOSIS — Z0000 Encounter for general adult medical examination without abnormal findings: Secondary | ICD-10-CM

## 2023-11-20 DIAGNOSIS — I10 Essential (primary) hypertension: Secondary | ICD-10-CM | POA: Diagnosis not present

## 2023-11-20 DIAGNOSIS — M5442 Lumbago with sciatica, left side: Secondary | ICD-10-CM | POA: Diagnosis not present

## 2023-11-20 DIAGNOSIS — G8929 Other chronic pain: Secondary | ICD-10-CM

## 2023-11-20 DIAGNOSIS — G4733 Obstructive sleep apnea (adult) (pediatric): Secondary | ICD-10-CM

## 2023-11-20 DIAGNOSIS — M8589 Other specified disorders of bone density and structure, multiple sites: Secondary | ICD-10-CM | POA: Diagnosis not present

## 2023-11-20 DIAGNOSIS — F4323 Adjustment disorder with mixed anxiety and depressed mood: Secondary | ICD-10-CM | POA: Diagnosis not present

## 2023-11-20 DIAGNOSIS — Z0001 Encounter for general adult medical examination with abnormal findings: Secondary | ICD-10-CM

## 2023-11-20 DIAGNOSIS — E66811 Obesity, class 1: Secondary | ICD-10-CM | POA: Diagnosis not present

## 2023-11-20 DIAGNOSIS — K7460 Unspecified cirrhosis of liver: Secondary | ICD-10-CM

## 2023-11-20 DIAGNOSIS — R7303 Prediabetes: Secondary | ICD-10-CM | POA: Diagnosis not present

## 2023-11-20 DIAGNOSIS — E7800 Pure hypercholesterolemia, unspecified: Secondary | ICD-10-CM | POA: Diagnosis not present

## 2023-11-20 DIAGNOSIS — F331 Major depressive disorder, recurrent, moderate: Secondary | ICD-10-CM

## 2023-11-20 DIAGNOSIS — J431 Panlobular emphysema: Secondary | ICD-10-CM

## 2023-11-20 DIAGNOSIS — E559 Vitamin D deficiency, unspecified: Secondary | ICD-10-CM | POA: Diagnosis not present

## 2023-11-20 LAB — BAYER DCA HB A1C WAIVED: HB A1C (BAYER DCA - WAIVED): 5.7 % — ABNORMAL HIGH (ref 4.8–5.6)

## 2023-11-20 MED ORDER — LOSARTAN POTASSIUM 100 MG PO TABS: 100.0000 mg | ORAL_TABLET | Freq: Every day | ORAL | 1 refills | Status: DC

## 2023-11-20 MED ORDER — CARVEDILOL 6.25 MG PO TABS: 6.2500 mg | ORAL_TABLET | Freq: Two times a day (BID) | ORAL | 1 refills | Status: DC

## 2023-11-20 MED ORDER — PREGABALIN 300 MG PO CAPS: 300.0000 mg | ORAL_CAPSULE | Freq: Two times a day (BID) | ORAL | 1 refills | Status: DC

## 2023-11-20 MED ORDER — CARIPRAZINE HCL 1.5 MG PO CAPS: 1.5000 mg | ORAL_CAPSULE | Freq: Every day | ORAL | 3 refills | Status: AC

## 2023-11-20 MED ORDER — PANTOPRAZOLE SODIUM 20 MG PO TBEC: DELAYED_RELEASE_TABLET | ORAL | 1 refills | Status: DC

## 2023-11-20 MED ORDER — HYDROCHLOROTHIAZIDE 25 MG PO TABS
25.0000 mg | ORAL_TABLET | Freq: Every day | ORAL | 1 refills | Status: DC
Start: 2023-11-20 — End: 2024-02-22

## 2023-11-20 MED ORDER — ZEPBOUND 5 MG/0.5ML ~~LOC~~ SOAJ: 5.0000 mg | SUBCUTANEOUS | 0 refills | Status: DC

## 2023-11-20 MED ORDER — BUSPIRONE HCL 15 MG PO TABS: ORAL_TABLET | ORAL | 1 refills | Status: DC

## 2023-11-20 MED ORDER — ZEPBOUND 2.5 MG/0.5ML ~~LOC~~ SOAJ
2.5000 mg | SUBCUTANEOUS | 0 refills | Status: DC
Start: 2023-11-20 — End: 2023-11-23

## 2023-11-20 MED ORDER — ROSUVASTATIN CALCIUM 5 MG PO TABS: 5.0000 mg | ORAL_TABLET | Freq: Every day | ORAL | 1 refills | Status: DC

## 2023-11-20 MED ORDER — ZEPBOUND 7.5 MG/0.5ML ~~LOC~~ SOAJ
7.5000 mg | SUBCUTANEOUS | 0 refills | Status: DC
Start: 2023-11-20 — End: 2023-11-23

## 2023-11-20 MED ORDER — ZEPBOUND 10 MG/0.5ML ~~LOC~~ SOAJ: 10.0000 mg | SUBCUTANEOUS | 0 refills | Status: DC

## 2023-11-20 NOTE — Progress Notes (Signed)
 Jamie Mcgee is a 63 y.o. female presents to office today for annual physical exam examination.    Concerns today include: 1.  Obesity with moderate obstructive sleep apnea diagnosed in 2022,, hypertension, hyperlipidemia, nonalcoholic cirrhosis of the liver and prediabetes/ Depression w/ anxiety She would like to discuss how she can get some of this weight off of her.  She has been trialed on Vyvanse  in the past with little improvement in weight.  She has tried diet modification.  Her physical activity is limited secondary to chronic back pain for which she takes Lyrica  at nighttime and in fact notes that she needs to increase the dose to twice daily dosing because she is having breakthrough pain during the day.  She reports she is not able to tolerate a CPAP machine.  Denies any chest pain.  No shortness of breath outside of baseline.  She is compliant with her inhaler as she has known emphysema  She is compliant with both her blood pressure and cholesterol medications. She continues to smoke sadly.  She admits to uncontrolled depressive symptoms and reports low motivation and isolative behaviors.  She is compliant with Celexa  40 mg and BuSpar  15 mg twice daily.  She has trialed bupropion in the past but it did not help with depressive symptoms.  Occupation: retired, Substance use: tobacco, smoked THC in [place]  recently because it's legal and helped her pain/anxiety Health Maintenance Due  Topic Date Due   COVID-19 Vaccine (2 - Moderna risk series) 11/03/2019   DEXA SCAN  05/14/2023   Refills needed today: all  Immunization History  Administered Date(s) Administered   Fluad Quad(high Dose 65+) 04/06/2022   Influenza,inj,Quad PF,6+ Mos 03/15/2018, 03/10/2020   Moderna Sars-Covid-2 Vaccination 10/06/2019   PNEUMOCOCCAL CONJUGATE-20 04/13/2021   Pneumococcal Polysaccharide-23 01/04/2018   Tdap 01/04/2018   Zoster Recombinant(Shingrix) 10/11/2020, 03/24/2021   Past Medical History:   Diagnosis Date   Allergy    Anxiety    Arthritis    Binge eating disorder    Carpal tunnel syndrome on right    Cataract    Both lenses been replaced   Cervical stenosis of spine    C5-6   COPD (chronic obstructive pulmonary disease) (HCC)    Gold III   Depression    Emphysema of lung (HCC)    Encounter for screening fecal occult blood testing 11/29/2022   Fibromuscular dysplasia of renal artery (HCC) 2006   GERD (gastroesophageal reflux disease)    Headache    migraines 20 years ago   Hepatitis    type A in 1977   HNP (herniated nucleus pulposus), cervical    HPV (human papilloma virus) infection    HTN (hypertension)    Neuropathy    Sleep apnea    Vitamin D  deficiency    Social History   Socioeconomic History   Marital status: Divorced    Spouse name: Not on file   Number of children: Not on file   Years of education: Not on file   Highest education level: GED or equivalent  Occupational History   Not on file  Tobacco Use   Smoking status: Every Day    Current packs/day: 0.00    Average packs/day: 1 pack/day for 40.0 years (40.0 ttl pk-yrs)    Types: Cigarettes    Start date: 11/01/1981    Last attempt to quit: 11/01/2021    Years since quitting: 2.0    Passive exposure: Current   Smokeless tobacco: Never   Tobacco  comments:    Quit 11/15/21  Vaping Use   Vaping status: Never Used  Substance and Sexual Activity   Alcohol use: No   Drug use: Yes    Frequency: 0.5 times per week    Types: Marijuana    Comment: daily   Sexual activity: Not Currently    Birth control/protection: Abstinence, Post-menopausal    Comment: tubal  Other Topics Concern   Not on file  Social History Narrative   The patient does not have any sex drive and has not had one for almost a year.   Her   husband thinks she does not love him anymore.  He does not seem to understand   her   depression.   Social Drivers of Corporate investment banker Strain: Low Risk  (11/14/2023)    Overall Financial Resource Strain (CARDIA)    Difficulty of Paying Living Expenses: Not very hard  Food Insecurity: Food Insecurity Present (11/14/2023)   Hunger Vital Sign    Worried About Running Out of Food in the Last Year: Sometimes true    Ran Out of Food in the Last Year: Sometimes true  Transportation Needs: No Transportation Needs (11/14/2023)   PRAPARE - Administrator, Civil Service (Medical): No    Lack of Transportation (Non-Medical): No  Physical Activity: Inactive (11/14/2023)   Exercise Vital Sign    Days of Exercise per Week: 0 days    Minutes of Exercise per Session: 30 min  Stress: Stress Concern Present (11/14/2023)   [employer] of Occupational Health - Occupational Stress Questionnaire    Feeling of Stress : To some extent  Social Connections: Socially Isolated (11/14/2023)   Social Connection and Isolation Panel    Frequency of Communication with Friends and Family: More than three times a week    Frequency of Social Gatherings with Friends and Family: Once a week    Attends Religious Services: Never    Database administrator or Organizations: No    Attends Banker Meetings: Never    Marital Status: Divorced  Catering manager Violence: Not At Risk (07/03/2023)   Humiliation, Afraid, Rape, and Kick questionnaire    Fear of Current or Ex-Partner: No    Emotionally Abused: No    Physically Abused: No    Sexually Abused: No   Past Surgical History:  Procedure Laterality Date   ANTERIOR CERVICAL DECOMP/DISCECTOMY FUSION N/A 10/05/2017   Procedure: C5-6 ANTERIOR CERVICAL DECOMPRESSION/DISCECTOMY FUSION,  ALLOGRAFT, PLATE  (NECK FIRST AND CARPAL TUNNEL RELEASE SECOND);  Surgeon: Adah Acron, MD;  Location: [facility] OR;  Service: Orthopedics;  Laterality: N/A;   APPENDECTOMY  1993   BREAST BIOPSY Right    CARPAL TUNNEL RELEASE Right 10/05/2017   Procedure: RIGHT CARPAL TUNNEL RELEASE;  Surgeon: Adah Acron, MD;  Location: [facility] OR;  Service:  Orthopedics;  Laterality: Right;   CATARACT EXTRACTION  08/2010   left    CATARACT EXTRACTION W/PHACO  06/01/2011   Procedure: CATARACT EXTRACTION PHACO AND INTRAOCULAR LENS PLACEMENT (IOC);  Surgeon: Anner Kill;  Location: [facility] ORS;  Service: Ophthalmology;  Laterality: Right;  CDE:9.62   CHOLECYSTECTOMY     COLONOSCOPY WITH PROPOFOL  N/A 07/06/2020   Procedure: COLONOSCOPY WITH PROPOFOL ;  Surgeon: [name], DO;  Location: [facility] ENDOSCOPY;  Service: Endoscopy;  Laterality: N/A;  11:00am   ESOPHAGEAL DILATION N/A 11/05/2023   Procedure: DILATION, ESOPHAGUS;  Surgeon: [name], DO;  Location: [facility];  Service: Endoscopy;  Laterality: N/A;   ESOPHAGOGASTRODUODENOSCOPY N/A 11/05/2023   Procedure: EGD (ESOPHAGOGASTRODUODENOSCOPY);  Surgeon: [name], DO;  [facility]: [facility];  Service: Endoscopy;  Laterality: N/A;  8:00 am, asa 3   EYE SURGERY     Cataract removal   RENAL ARTERY ANGIOPLASTY  2006   right   TUBAL LIGATION  1984   [facility]   Family History  Problem Relation Age of Onset   Cancer Mother        breast   Anxiety disorder Mother    Arthritis Mother    Depression Mother    Obesity Mother    Coronary artery disease Father        States congenital abnormality   Diabetes Father    Hyperlipidemia Father    Early death Father    CAD Sister        Stents    Cancer Sister        breast   Anxiety disorder Sister    Asthma Sister    Depression Sister    Obesity Sister    Cancer Maternal Grandmother        breast   Heart disease Maternal Grandfather    Cancer Maternal Aunt        breast   Anesthesia problems Neg Hx    Hypotension Neg Hx    Malignant hyperthermia Neg Hx    Pseudochol deficiency Neg Hx    Colon cancer Neg Hx    Liver disease Neg Hx     Current Outpatient Medications:    albuterol  (VENTOLIN  HFA) 108 (90 Base) MCG/ACT inhaler, INHALE 2 PUFFS INTO THE LUNGS EVERY 6 HOURS AS NEEDED FOR WHEEZING OR SHORTNESS OF BREATH, Disp: 18 g,  Rfl: 5   Budeson-Glycopyrrol-Formoterol  (BREZTRI  AEROSPHERE) 160-9-4.8 MCG/ACT AERO, USE 2 INHALATIONS BY MOUTH TWICE DAILY, Disp: 32.1 g, Rfl: 7   busPIRone  (BUSPAR ) 15 MG tablet, TAKE ONE TABLET BY MOUTH TWICE DAILY FOR ANXIETY, Disp: 90 tablet, Rfl: 1   calcium  carbonate (OS-CAL - DOSED IN MG OF ELEMENTAL CALCIUM ) 1250 (500 Ca) MG tablet, Take 1 tablet by mouth., Disp: , Rfl:    CALCIUM -VITAMIN D  PO, Take 1 tablet by mouth daily. (Patient not taking: Reported on 11/08/2023), Disp: , Rfl:    carvedilol  (COREG ) 6.25 MG tablet, Take 1 tablet (6.25 mg total) by mouth 2 (two) times daily with a meal., Disp: 200 tablet, Rfl: 1   Cholecalciferol  (VITAMIN D -3) 125 MCG (5000 UT) TABS, Take by mouth., Disp: , Rfl:    citalopram  (CELEXA ) 40 MG tablet, Take 1 tablet (40 mg total) by mouth daily., Disp: 90 tablet, Rfl: 3   fexofenadine (ALLEGRA) 180 MG tablet, Take 180 mg by mouth daily as needed., Disp: , Rfl:    fluticasone  (FLONASE ) 50 MCG/ACT nasal spray, Place 2 sprays into both nostrils daily., Disp: 16 g, Rfl: 6   hydrochlorothiazide  (HYDRODIURIL ) 25 MG tablet, TAKE 1 TABLET BY MOUTH DAILY, Disp: 100 tablet, Rfl: 1   losartan  (COZAAR ) 100 MG tablet, TAKE 1 TABLET BY MOUTH DAILY, Disp: 100 tablet, Rfl: 1   Misc Natural Products (COLON CLEANSE PO), Take by mouth., Disp: , Rfl:    nitroGLYCERIN  (NITROSTAT ) 0.4 MG SL tablet, Place 1 tablet (0.4 mg total) under the tongue every 5 (five) minutes as needed., Disp: 25 tablet, Rfl: 3   pantoprazole  (PROTONIX ) 20 MG tablet, TAKE 1 TABLET BY MOUTH DAILY FOR ACID REFLUX, Disp: 100 tablet, Rfl: 1   pregabalin  (LYRICA ) 300 MG capsule, Take 300mg  each  evening., Disp: 90 capsule, Rfl: 1   Probiotic Product (PROBIOTIC PO), Take by mouth., Disp: , Rfl:    rosuvastatin  (CRESTOR ) 5 MG tablet, TAKE 1 TABLET BY MOUTH AT  BEDTIME, Disp: 100 tablet, Rfl: 1  Allergies  Allergen Reactions   Lipitor [Atorvastatin ] Other (See Comments)    Muscle aches and nausea   Naproxen   Nausea And Vomiting    Stomach pain     ROS: Review of Systems Pertinent items noted in HPI and remainder of comprehensive ROS otherwise negative.    Physical exam BP 124/77   Pulse 73   Temp 98.7 F (37.1 C)   Ht 5' 5 (1.651 m)   Wt 203 lb (92.1 kg)   LMP 05/25/2011   SpO2 96%   BMI 33.78 kg/m  General appearance: alert, cooperative, appears stated age, no distress, and moderately obese Head: Normocephalic, without obvious abnormality, atraumatic Eyes: negative findings: lids and lashes normal, conjunctivae and sclerae normal, corneas clear, and pupils equal, round, reactive to light and accomodation Ears: normal TM's and external ear canals both ears Nose: Nares normal. Septum midline. Mucosa normal. No drainage or sinus tenderness. Throat: Several missing teeth.  Oropharynx without masses or lesions Neck: no adenopathy, supple, symmetrical, trachea midline, thyroid  not enlarged, symmetric, no tenderness/mass/nodules, and well-healed scar appreciated along the neck Back: symmetric, no curvature. ROM normal. No CVA tenderness. Lungs: clear to auscultation bilaterally Heart: regular rate and rhythm, S1, S2 normal, no murmur, click, rub or gallop Abdomen: soft, non-tender; bowel sounds normal; no masses,  no organomegaly and obese Extremities: extremities normal, atraumatic, no cyanosis or edema Pulses: 2+ and symmetric Skin: Multiple healing areas of ecchymosis and abrasion along the lower extremities bilaterally.  She has a seborrheic keratosis on the right thigh Lymph nodes: Cervical, supraclavicular, and axillary nodes normal. Neurologic: Grossly normal       11/20/2023   12:55 PM 07/03/2023    9:00 AM 04/12/2023    3:25 PM  Depression screen PHQ 2/9  Decreased Interest 0 0 0  Down, Depressed, Hopeless 0 0 0  PHQ - 2 Score 0 0 0  Altered sleeping 0  0  Tired, decreased energy 0  2  Change in appetite 0  0  Feeling bad or failure about yourself  0  0  Trouble  concentrating 0  0  Moving slowly or fidgety/restless 0  0  Suicidal thoughts 0  0  PHQ-9 Score 0  2  Difficult doing work/chores   Not difficult at all      11/20/2023   12:55 PM 04/12/2023    3:26 PM 11/29/2022    1:36 PM 08/14/2022    9:17 AM  GAD 7 : Generalized Anxiety Score  Nervous, Anxious, on Edge 0 0 1 0  Control/stop worrying 0 0 1 0  Worry too much - different things 0 0 1 0  Trouble relaxing 0 0 1 1  Restless 0 0 1 1  Easily annoyed or irritable 0 0 0 0  Afraid - awful might happen 0 0 0 0  Total GAD 7 Score 0 0 5 2  Anxiety Difficulty  Not difficult at all  Not difficult at all     Assessment/ Plan: Maylene Spear here for annual physical exam.   Annual physical exam  Moderate episode of recurrent major depressive disorder (HCC) - Plan: cariprazine (VRAYLAR) 1.5 MG capsule  Situational mixed anxiety and depressive disorder - Plan: busPIRone  (BUSPAR ) 15 MG tablet  Obesity (BMI 30.0-34.9) -  Plan: tirzepatide (ZEPBOUND) 2.5 MG/0.5ML Pen, tirzepatide (ZEPBOUND) 5 MG/0.5ML Pen, tirzepatide (ZEPBOUND) 7.5 MG/0.5ML Pen, tirzepatide (ZEPBOUND) 10 MG/0.5ML Pen  Chronic bilateral low back pain with bilateral sciatica - Plan: ToxASSURE Select 13 (MW), Urine, pregabalin  (LYRICA ) 300 MG capsule, DISCONTINUED: pregabalin  (LYRICA ) 300 MG capsule  Moderate obstructive sleep apnea - Plan: tirzepatide (ZEPBOUND) 2.5 MG/0.5ML Pen, tirzepatide (ZEPBOUND) 5 MG/0.5ML Pen, tirzepatide (ZEPBOUND) 7.5 MG/0.5ML Pen, tirzepatide (ZEPBOUND) 10 MG/0.5ML Pen  Essential hypertension, benign - Plan: tirzepatide (ZEPBOUND) 2.5 MG/0.5ML Pen, tirzepatide (ZEPBOUND) 5 MG/0.5ML Pen, tirzepatide (ZEPBOUND) 7.5 MG/0.5ML Pen, tirzepatide (ZEPBOUND) 10 MG/0.5ML Pen, carvedilol  (COREG ) 6.25 MG tablet, hydrochlorothiazide  (HYDRODIURIL ) 25 MG tablet, losartan  (COZAAR ) 100 MG tablet  Pre-diabetes - Plan: Bayer DCA Hb A1c Waived  Pure hypercholesterolemia - Plan: Lipid Panel, tirzepatide (ZEPBOUND) 2.5  MG/0.5ML Pen, tirzepatide (ZEPBOUND) 5 MG/0.5ML Pen, tirzepatide (ZEPBOUND) 7.5 MG/0.5ML Pen, tirzepatide (ZEPBOUND) 10 MG/0.5ML Pen, rosuvastatin  (CRESTOR ) 5 MG tablet  Cirrhosis of liver without ascites, unspecified hepatic cirrhosis type (HCC)  Panlobular emphysema (HCC)  Osteopenia of multiple sites - Plan: VITAMIN D  25 Hydroxy (Vit-D Deficiency, Fractures)  Vitamin D  deficiency - Plan: VITAMIN D  25 Hydroxy (Vit-D Deficiency, Fractures)  DEXA and lung cancer screening due  Trial on Vraylar to help with major depressive disorder that is not controlled with max dose Celexa  and BuSpar .  She has failed bupropion in the past.  She has known moderate obstructive sleep apnea based on 2022 sleep study.  Copy in above note.  Her BMI is over 33 and she has hypertension, hyperlipidemia, prediabetes and cirrhosis of the liver as complications of obesity in the past.  I think that she is a good candidate for Zepbound and we will try and pursue this medication as she has failed diet modification and physical exercise due to chronic pain.  She is not a good candidate for phentermine, Vyvanse , Qsymia or Contrave.  She has no apparent contraindications to utilization of GIP.  I have adjusted her Lyrica  to reflect twice daily dosing.  UDS and CSA were updated as per office policy and the national narcotic database was reviewed with no red flags.  She had admitted to isolated use of THC while in a state where it is legal today.  We will check vitamin D  given history of deficiency and known osteopenia  Check fasting lab today.  Continue all medications as prescribed.  Counseled on healthy lifestyle choices, including diet (rich in fruits, vegetables and lean meats and low in salt and simple carbohydrates) and exercise (at least 30 minutes of moderate physical activity daily).  Patient to follow up 54m for weight and mood  Marlis Oldaker M. Bonnell Butcher, DO

## 2023-11-20 NOTE — Patient Instructions (Addendum)
Tips for success with Zepbound (and by success, how not to be super sick on your stomach): Eat small meals AVOID heavy foods (fried/ high in carbs like bread, pasta, rice) AVOID carbonated beverages (soda/ beer, as these can increase bloating) DOUBLE your water intake (will help you avoid constipation/ dehydration)  Zepbound CAN cause: Nausea Abdominal pain Increased acid reflux (sometimes presents as "sour burps") Constipation OR Diarrhea Fatigue (especially when you first start it)

## 2023-11-20 NOTE — Telephone Encounter (Signed)
 Copied from CRM [id number]. Topic: Clinical - Lab/Test Results >> Nov 20, 2023  3:47 PM Phil Braun wrote: Reason for CRM: Pt needs a dexascan rescheduled. She didn't have it done today. Please call pt back and reschedule.

## 2023-11-21 ENCOUNTER — Telehealth: Payer: Self-pay

## 2023-11-21 ENCOUNTER — Ambulatory Visit: Payer: Self-pay | Admitting: Family Medicine

## 2023-11-21 ENCOUNTER — Other Ambulatory Visit (HOSPITAL_COMMUNITY): Payer: Self-pay

## 2023-11-21 LAB — LIPID PANEL
Chol/HDL Ratio: 1.8 ratio (ref 0.0–4.4)
Cholesterol, Total: 119 mg/dL (ref 100–199)
HDL: 65 mg/dL (ref >39)
LDL Chol Calc (NIH): 41 mg/dL (ref 0–99)
Triglycerides: 62 mg/dL (ref 0–149)
VLDL Cholesterol Cal: 13 mg/dL (ref 5–40)

## 2023-11-21 LAB — VITAMIN D 25 HYDROXY (VIT D DEFICIENCY, FRACTURES): Vit D, 25-Hydroxy: 47 ng/mL (ref 30.0–100.0)

## 2023-11-21 NOTE — Telephone Encounter (Signed)
 Called patient - she wanted to make to have DEXA done following OV in Sept - appt made

## 2023-11-21 NOTE — Telephone Encounter (Signed)
 Pharmacy Patient Advocate Encounter   Received notification from CoverMyMeds that prior authorization for Zepbound 10 is required/requested.   Insurance verification completed.   The patient is insured through [employer] .   Per test claim: plan benefit exclusion, non formulary. Will not be covered.

## 2023-11-22 ENCOUNTER — Other Ambulatory Visit: Payer: Self-pay

## 2023-11-22 ENCOUNTER — Other Ambulatory Visit: Payer: Self-pay | Admitting: Family Medicine

## 2023-11-22 DIAGNOSIS — G8929 Other chronic pain: Secondary | ICD-10-CM

## 2023-11-22 MED ORDER — PREGABALIN 300 MG PO CAPS: 300.0000 mg | ORAL_CAPSULE | Freq: Two times a day (BID) | ORAL | 1 refills | Status: DC

## 2023-11-23 ENCOUNTER — Other Ambulatory Visit: Payer: Self-pay | Admitting: Family Medicine

## 2023-11-23 ENCOUNTER — Encounter: Payer: Self-pay | Admitting: Family Medicine

## 2023-11-23 ENCOUNTER — Telehealth: Payer: Self-pay

## 2023-11-23 DIAGNOSIS — I10 Essential (primary) hypertension: Secondary | ICD-10-CM

## 2023-11-23 DIAGNOSIS — E66811 Obesity, class 1: Secondary | ICD-10-CM

## 2023-11-23 DIAGNOSIS — G8929 Other chronic pain: Secondary | ICD-10-CM

## 2023-11-23 DIAGNOSIS — E7800 Pure hypercholesterolemia, unspecified: Secondary | ICD-10-CM

## 2023-11-23 DIAGNOSIS — G4733 Obstructive sleep apnea (adult) (pediatric): Secondary | ICD-10-CM

## 2023-11-23 LAB — TOXASSURE SELECT 13 (MW), URINE

## 2023-11-23 MED ORDER — ZEPBOUND 7.5 MG/0.5ML ~~LOC~~ SOAJ
7.5000 mg | SUBCUTANEOUS | 0 refills | Status: DC
Start: 2023-11-23 — End: 2023-12-03

## 2023-11-23 MED ORDER — PREGABALIN 300 MG PO CAPS: 300.0000 mg | ORAL_CAPSULE | Freq: Two times a day (BID) | ORAL | 1 refills | Status: DC

## 2023-11-23 MED ORDER — ZEPBOUND 5 MG/0.5ML ~~LOC~~ SOAJ
5.0000 mg | SUBCUTANEOUS | 0 refills | Status: DC
Start: 2023-11-23 — End: 2023-12-03

## 2023-11-23 MED ORDER — ZEPBOUND 10 MG/0.5ML ~~LOC~~ SOAJ: 10.0000 mg | SUBCUTANEOUS | 0 refills | Status: DC

## 2023-11-23 MED ORDER — ZEPBOUND 2.5 MG/0.5ML ~~LOC~~ SOAJ: 2.5000 mg | SUBCUTANEOUS | 0 refills | Status: DC

## 2023-11-23 NOTE — Telephone Encounter (Signed)
 Already completed, denied and pt was notified. LS

## 2023-11-23 NOTE — Telephone Encounter (Signed)
 Can you check into that PA?

## 2023-11-23 NOTE — Telephone Encounter (Signed)
 Copied from CRM [id number]. Topic: Clinical - Medication Prior Auth >> Nov 23, 2023  9:30 AM Tiffany S wrote: Reason for CRM: tirzepatide (ZEPBOUND) Needs prior [employer]gfried Dress  Fax [phone] Alisa App from [place]

## 2023-12-03 ENCOUNTER — Other Ambulatory Visit (HOSPITAL_COMMUNITY): Payer: Self-pay

## 2023-12-03 ENCOUNTER — Ambulatory Visit: Admitting: Adult Health

## 2023-12-03 ENCOUNTER — Encounter: Payer: Self-pay | Admitting: Adult Health

## 2023-12-03 ENCOUNTER — Other Ambulatory Visit (HOSPITAL_COMMUNITY)
Admission: RE | Admit: 2023-12-03 | Discharge: 2023-12-03 | Disposition: A | Discharge status: Home / Self Care | Source: Ambulatory Visit | Attending: Adult Health | Admitting: Adult Health

## 2023-12-03 ENCOUNTER — Encounter: Payer: Self-pay | Admitting: Internal Medicine

## 2023-12-03 ENCOUNTER — Other Ambulatory Visit (HOSPITAL_COMMUNITY)
Admission: RE | Admit: 2023-12-03 | Discharge: 2023-12-03 | Disposition: A | Discharge status: Home / Self Care | Payer: Self-pay | Source: Ambulatory Visit | Attending: Medical Genetics | Admitting: Medical Genetics

## 2023-12-03 VITALS — BP 135/82 | HR 73 | Ht 65.0 in | Wt 205.0 lb

## 2023-12-03 DIAGNOSIS — Z01419 Encounter for gynecological examination (general) (routine) without abnormal findings: Secondary | ICD-10-CM | POA: Diagnosis present

## 2023-12-03 DIAGNOSIS — Z1151 Encounter for screening for human papillomavirus (HPV): Secondary | ICD-10-CM | POA: Insufficient documentation

## 2023-12-03 DIAGNOSIS — N950 Postmenopausal bleeding: Secondary | ICD-10-CM | POA: Diagnosis not present

## 2023-12-03 DIAGNOSIS — R87810 Cervical high risk human papillomavirus (HPV) DNA test positive: Secondary | ICD-10-CM | POA: Diagnosis not present

## 2023-12-03 DIAGNOSIS — I10 Essential (primary) hypertension: Secondary | ICD-10-CM | POA: Diagnosis not present

## 2023-12-03 DIAGNOSIS — Z1332 Encounter for screening for maternal depression: Secondary | ICD-10-CM

## 2023-12-03 DIAGNOSIS — Z006 Encounter for examination for normal comparison and control in clinical research program: Secondary | ICD-10-CM | POA: Insufficient documentation

## 2023-12-03 NOTE — Progress Notes (Signed)
 Patient ID: Jamie Mcgee, female   DOB: [birth year], 63 y.o.   MRN: [id number] History of Present Illness: Jamie Mcgee is a 63 year old white female,divorced, PM in for a well woman gyn exam and pap. Her last pap was ASCUS +HPV.  She had vaginal spotting for 2 days last Monday. She had PMB in 2022 but did not get an endometrial biopsy then, she cancelled. She had one in 2017.  PCP is Dr Jolinda   Current Medications, Allergies, Past Medical History, Past Surgical History, Family History and Social History were reviewed in [employer] electronic medical record.     Review of Systems: Patient denies any headaches, hearing loss, fatigue, blurred vision, shortness of breath, chest pain, abdominal pain, problems with bowel movements(has constipation but uses colon cleanse), urination, or intercourse(not active). No joint pain or mood swings.     Physical Exam:BP 135/82 (BP Location: Right Arm, Patient Position: Sitting, Cuff Size: Large)   Pulse 73   Ht 5' 5 (1.651 m)   Wt 205 lb (93 kg)   LMP 05/25/2011   BMI 34.11 kg/m   General:  Well developed, well nourished, no acute distress Skin:  Warm and dry Neck:  Midline trachea, normal thyroid , good ROM, no lymphadenopathy,no carotid bruits heard Lungs: has expiratory wheezing on left  Breast:  No dominant palpable mass, retraction, or nipple discharge Cardiovascular: Regular rate and rhythm Abdomen:  Soft, non tender, no hepatosplenomegaly Pelvic:  External genitalia is normal in appearance, has 2 small epidermal cysts left labia.  The vagina is pale.  Urethra has no lesions or masses. The cervix is smooth, pap with HR HPV genotyping performed, by vernell Ruddle CNM student under my guidance.  Uterus is felt to be normal size, shape, and contour.  No adnexal masses or tenderness noted.Bladder is non tender, no masses felt. Rectal: deferred has hemorrhoids she says  Extremities/musculoskeletal:  No swelling or varicosities noted, no  clubbing or cyanosis Psych:  No mood changes, alert and cooperative,seems happy AA is 1    12/03/2023    9:38 AM 11/20/2023    1:03 PM 11/20/2023   12:55 PM  Depression screen PHQ 2/9  Decreased Interest 1 3 0  Down, Depressed, Hopeless 2 3 0  PHQ - 2 Score 3 6 0  Altered sleeping 2 3 0  Tired, decreased energy 3 3 0  Change in appetite 3 3 0  Feeling bad or failure about yourself  0 1 0  Trouble concentrating 1 2 0  Moving slowly or fidgety/restless 0 0 0  Suicidal thoughts 0 0 0  PHQ-9 Score 12 18 0  Difficult doing work/chores  Somewhat difficult        12/03/2023    9:38 AM 11/20/2023    1:02 PM 11/20/2023   12:55 PM 04/12/2023    3:26 PM  GAD 7 : Generalized Anxiety Score  Nervous, Anxious, on Edge 2 2 0 0  Control/stop worrying 0 2 0 0  Worry too much - different things 0 0 0 0  Trouble relaxing 1 1 0 0  Restless 1 1 0 0  Easily annoyed or irritable 1 1 0 0  Afraid - awful might happen 0 0 0 0  Total GAD 7 Score 5 7 0 0  Anxiety Difficulty  Somewhat difficult  Not difficult at all      Upstream - 12/03/23 0935       Pregnancy Intention Screening   Does the patient want to become pregnant  in the next year? N/A    Does the patient's partner want to become pregnant in the next year? N/A    Would the patient like to discuss contraceptive options today? N/A      Contraception Wrap Up   Current Method Female Sterilization   PM   End Method Female Sterilization   PM   Contraception Counseling Provided No         Co exam with Vernell Ruddle CNM student   Impression and plan: 1. Encounter for gynecological examination with Papanicolaou smear of cervix (Primary) Pap sent Physical in 1 year Labs with PCP Mammogram was negative 02/08/23 Colonoscopy per GI  - Cytology - PAP( [facility])  2. Cervical high risk HPV (human papillomavirus) test positive, normal cytology Pap sent  3. Essential hypertension, benign Take BP meds and follow up with PCP  4. PMB  (postmenopausal bleeding) Has spotting for 2 days US  scheduled for 12/11/23 at 7:30 am at [facility]  Will talk when results back and is aware will need biopsy if endometrium is thickened and she says she will get this time if needed  - US  PELVIC COMPLETE WITH TRANSVAGINAL; Future

## 2023-12-05 ENCOUNTER — Ambulatory Visit: Payer: Self-pay | Admitting: Adult Health

## 2023-12-05 LAB — CYTOLOGY - PAP
Comment: NEGATIVE
Comment: NEGATIVE
Comment: NEGATIVE
Diagnosis: HIGH — AB
HPV 16: NEGATIVE
HPV 18 / 45: NEGATIVE
High risk HPV: POSITIVE — AB

## 2023-12-10 ENCOUNTER — Ambulatory Visit: Admitting: Gastroenterology

## 2023-12-11 ENCOUNTER — Ambulatory Visit (HOSPITAL_COMMUNITY)
Admission: RE | Admit: 2023-12-11 | Discharge: 2023-12-11 | Disposition: A | Discharge status: Home / Self Care | Source: Ambulatory Visit | Attending: Adult Health | Admitting: Adult Health

## 2023-12-11 DIAGNOSIS — N950 Postmenopausal bleeding: Secondary | ICD-10-CM | POA: Diagnosis present

## 2023-12-11 DIAGNOSIS — Z0189 Encounter for other specified special examinations: Secondary | ICD-10-CM | POA: Diagnosis not present

## 2023-12-17 ENCOUNTER — Other Ambulatory Visit (HOSPITAL_COMMUNITY): Payer: Self-pay

## 2023-12-17 ENCOUNTER — Encounter: Payer: Self-pay | Admitting: Internal Medicine

## 2023-12-17 ENCOUNTER — Ambulatory Visit: Attending: Internal Medicine | Admitting: Internal Medicine

## 2023-12-17 VITALS — BP 115/73 | HR 71 | Resp 16 | Ht 64.0 in | Wt 203.8 lb

## 2023-12-17 DIAGNOSIS — R768 Other specified abnormal immunological findings in serum: Secondary | ICD-10-CM

## 2023-12-17 DIAGNOSIS — M159 Polyosteoarthritis, unspecified: Secondary | ICD-10-CM | POA: Diagnosis not present

## 2023-12-17 DIAGNOSIS — R200 Anesthesia of skin: Secondary | ICD-10-CM | POA: Insufficient documentation

## 2023-12-17 NOTE — Patient Instructions (Signed)
 Please see the attached printouts for wrist and elbow range of motion exercises.  I believe tylenol  would be an okay option for pain relief, but would limit your dose to 1000 mg or less daily and not more than 2000 mg daily as needed. (Check the label, probably means 1 or 2 tylenol  arthritis strength)

## 2023-12-17 NOTE — Progress Notes (Signed)
 Office Visit Note  Patient: Jamie Mcgee             Date of Birth: 12/28/1960           MRN: [id number]             PCP: Jolinda Norene HERO, DO Referring: Jolinda Norene HERO, DO Visit Date: 12/17/2023   Subjective:  Follow-up (Discuss labs .)    Discussed the use of AI scribe software for clinical note transcription with the patient, who gave verbal consent to proceed.  History of Present Illness   Jamie Mcgee is a 63 year old female here for follow up of joint pain in multiple areas and bilateral hand numbness with intermittent dropping items unintentionally. Workup at initial visit for positive ANA was entirely negative for specific antibody markers or serum inflammatory markers.  Symptoms remain about the same since last month.  She experiences ongoing joint pain and stiffness, particularly in her hands and feet. Numbness is present in both hands, sometimes causing her to drop objects, and there is persistent numbness in the bottoms of her feet. The numbness in her hands is intermittent, while the numbness in her feet is constant. She has a history of carpal tunnel release surgery on the right wrist.  She uses Voltaren  gel for joint pain, which provides some relief but does not alleviate shoulder pain. She has previously received shoulder injections, which provided only temporary relief and caused a major flare-up after the last injection. She avoids Tylenol  due to concerns about liver health. She is cautious with NSAIDs due to gastritis.  She is currently taking Lyrica  that was recently increased to 300 mg twice daily for sciatica, which helps manage her pain but causes some grogginess  She reports swelling in her right pinky finger. She does not currently use wrist braces or perform stretches.  She feels very limited by financial resources now on a fixed income which is prohibitive to trying a lot of extra devices or supplements.  Her family history is notable  for her mother having severe arthritis.  Previous HPI 11/08/23 Jamie Mcgee is a 63 year old female here for evaluation of joint pain in multiple areas with positive ANA Abs.   She experiences significant joint pain, particularly in her hands and toes, which is constant and has worsened over the past year or two. Her hands and toes are often numb and painful, leading to difficulty with tasks such as opening jars and frequent dropping of items. The numbness is more pronounced in her toes than her hands. These symptoms have been present for years and have worsened since she stopped working in 2018 due to disability. She had previous right sided carpal tunnel release surgery but with recurrence of numbness.   She has a history of left shoulder pain, with an MRI indicating significant issues. She previously tried cortisone injections but experienced severe flares, leading to discontinuation. She uses NSAIDs sparingly due to liver concerns and finds topical treatments like Icy Hot ineffective. Sleeping on the affected side exacerbates the pain.   She experiences morning stiffness, taking a long time to become mobile. She also has sciatica and reports that her feet remain cold and sometimes turn white, especially after prolonged standing. She bruises easily, which she attributes to her liver condition.   Her family history includes rheumatoid arthritis, as her mother suffered from it for many years. She denies any history of oral or nasal ulcers but reports  chronic xerostomia, which she attributes to her medications.   She is not currently on any steroid medications, aspirin , or blood thinners, but she uses Breztri , which may contain steroids. She has tried turmeric pills for her symptoms without noticeable improvement.       Labs reviewed 04/2023 ANA 1:160 speckled Mitochondrial Abs 67 ASMA neg GP-210 neg SP-100 neg   09/2022 Mitochondrial Abs 37   11/2021 ANA neg RF neg ESR 32    Imaging reviewed 06/02/23 MRI Left shoulder  IMPRESSION: 1. Mild anterior supraspinatus tendinosis. 2. Very mild insertional tendinosis of the mid to posterior infraspinatus tendon. 3. Mild proximal long head of the biceps tendinosis. 4. Mild-to-moderate degenerative changes of the acromioclavicular joint. 5. Moderate to severe glenohumeral osteoarthritis.   Review of Systems  Constitutional:  Positive for fatigue.  HENT:  Positive for mouth dryness. Negative for mouth sores.   Eyes:  Positive for dryness.  Respiratory:  Positive for shortness of breath.   Cardiovascular:  Negative for chest pain and palpitations.  Gastrointestinal:  Positive for constipation and diarrhea. Negative for blood in stool.  Endocrine: Negative for increased urination.  Genitourinary:  Negative for involuntary urination.  Musculoskeletal:  Positive for joint pain, gait problem, joint pain, joint swelling, muscle weakness and morning stiffness. Negative for myalgias, muscle tenderness and myalgias.  Skin:  Negative for color change, rash, hair loss and sensitivity to sunlight.  Allergic/Immunologic: Negative for susceptible to infections.  Neurological:  Negative for dizziness and headaches.  Hematological:  Negative for swollen glands.  Psychiatric/Behavioral:  Positive for sleep disturbance. Negative for depressed mood. The patient is not nervous/anxious.     PMFS History:  Patient Active Problem List   Diagnosis Date Noted   Osteoarthritis, multiple sites 12/17/2023   Bilateral hand numbness 12/17/2023   Encounter for gynecological examination with Papanicolaou smear of cervix 12/03/2023   Positive ANA (antinuclear antibody) 11/08/2023   Dysphagia 10/08/2023   Centrilobular emphysema (HCC) 02/09/2023   ASCUS with positive high risk HPV cervical 12/06/2022   Encounter for screening fecal occult blood testing 11/29/2022   Cervical high risk HPV (human papillomavirus) test positive, normal cytology  11/29/2022   Hepatic cirrhosis (HCC) 09/05/2022   DNI (do not intubate) 05/10/2021   Papanicolaou smear of cervix with positive high risk human papilloma virus (HPV) test 10/28/2020   PMB (postmenopausal bleeding) 10/28/2020   Hemorrhoids 05/18/2020   Moderate episode of recurrent major depressive disorder (HCC) 08/20/2018   Pure hypercholesterolemia 08/20/2018   Chronic bilateral low back pain with bilateral sciatica 02/15/2018   Numbness and tingling of foot 02/15/2018   OSA (obstructive sleep apnea) 01/18/2018   Hypersomnolence 12/28/2017   History of carpal tunnel release 11/15/2017   History of fusion of cervical spine 10/18/2017   Panlobular emphysema (HCC) 07/12/2017   Osteopenia 11/22/2016   Pre-diabetes 12/25/2015   Morbid obesity (HCC) 12/25/2015   GERD (gastroesophageal reflux disease) 05/07/2015   Vitamin D  deficiency 05/07/2015   Metabolic syndrome 05/07/2015   Post-menopausal bleeding 05/07/2015   Depressed 12/19/2013   Essential hypertension, benign 12/19/2013    Past Medical History:  Diagnosis Date   Allergy    Anxiety    Arthritis    Binge eating disorder    Carpal tunnel syndrome on right    Cataract    Both lenses been replaced   Cervical stenosis of spine    C5-6   COPD (chronic obstructive pulmonary disease) (HCC)    Gold III   Depression    Emphysema of  lung (HCC)    Encounter for screening fecal occult blood testing 11/29/2022   Fibromuscular dysplasia of renal artery (HCC) 2006   GERD (gastroesophageal reflux disease)    Headache    migraines 20 years ago   Hepatitis    type A in 1977   HNP (herniated nucleus pulposus), cervical    HPV (human papilloma virus) infection    HTN (hypertension)    Neuropathy    Sleep apnea    Vitamin D  deficiency     Family History  Problem Relation Age of Onset   Cancer Mother        breast   Anxiety disorder Mother    Arthritis Mother    Depression Mother    Obesity Mother    Coronary artery disease  Father        States congenital abnormality   Diabetes Father    Hyperlipidemia Father    Early death Father    CAD Sister        Stents    Cancer Sister        breast   Anxiety disorder Sister    Asthma Sister    Depression Sister    Obesity Sister    Cancer Maternal Grandmother        breast   Heart disease Maternal Grandfather    Cancer Maternal Aunt        breast   Anesthesia problems Neg Hx    Hypotension Neg Hx    Malignant hyperthermia Neg Hx    Pseudochol deficiency Neg Hx    Colon cancer Neg Hx    Liver disease Neg Hx    Past Surgical History:  Procedure Laterality Date   ANTERIOR CERVICAL DECOMP/DISCECTOMY FUSION N/A 10/05/2017   Procedure: C5-6 ANTERIOR CERVICAL DECOMPRESSION/DISCECTOMY FUSION,  ALLOGRAFT, PLATE  (NECK FIRST AND CARPAL TUNNEL RELEASE SECOND);  Surgeon: Barbarann Oneil BROCKS, MD;  Location: [facility] OR;  Service: Orthopedics;  Laterality: N/A;   APPENDECTOMY  1993   BREAST BIOPSY Right    CARPAL TUNNEL RELEASE Right 10/05/2017   Procedure: RIGHT CARPAL TUNNEL RELEASE;  Surgeon: Barbarann Oneil BROCKS, MD;  Location: [facility] OR;  Service: Orthopedics;  Laterality: Right;   CATARACT EXTRACTION  08/2010   left    CATARACT EXTRACTION W/PHACO  06/01/2011   Procedure: CATARACT EXTRACTION PHACO AND INTRAOCULAR LENS PLACEMENT (IOC);  Surgeon: Cherene Mania;  Location: [facility] ORS;  Service: Ophthalmology;  Laterality: Right;  CDE:9.62   CHOLECYSTECTOMY     COLONOSCOPY WITH PROPOFOL  N/A 07/06/2020   Procedure: COLONOSCOPY WITH PROPOFOL ;  Surgeon: Cindie Carlin POUR, DO;  Location: [facility] ENDOSCOPY;  Service: Endoscopy;  Laterality: N/A;  11:00am   ESOPHAGEAL DILATION N/A 11/05/2023   Procedure: DILATION, ESOPHAGUS;  Surgeon: Cindie Carlin POUR, DO;  Location: [facility] ENDO SUITE;  Service: Endoscopy;  Laterality: N/A;   ESOPHAGOGASTRODUODENOSCOPY N/A 11/05/2023   Procedure: EGD (ESOPHAGOGASTRODUODENOSCOPY);  Surgeon: Cindie Carlin POUR, DO;  Location: [facility] ENDO SUITE;  Service: Endoscopy;  Laterality: N/A;   8:00 am, asa 3   EYE SURGERY     Cataract removal   RENAL ARTERY ANGIOPLASTY  2006   right   TUBAL LIGATION  1984   [facility]   UPPER GASTROINTESTINAL ENDOSCOPY  10/2023   Social History   Social History Narrative   The patient does not have any sex drive and has not had one for almost a year.   Her   husband thinks she does not love him anymore.  He does not seem to understand  her   depression.   Immunization History  Administered Date(s) Administered   Fluad Quad(high Dose 65+) 04/06/2022   Influenza,inj,Quad PF,6+ Mos 03/15/2018, 03/10/2020   Moderna Sars-Covid-2 Vaccination 10/06/2019   PNEUMOCOCCAL CONJUGATE-20 04/13/2021   Pneumococcal Polysaccharide-23 01/04/2018   Tdap 01/04/2018   Zoster Recombinant(Shingrix) 10/11/2020, 03/24/2021     Objective: Vital Signs: BP 115/73 (BP Location: Left Arm, Patient Position: Sitting, Cuff Size: Normal)   Pulse 71   Resp 16   Ht 5' 4 (1.626 m)   Wt 203 lb 12.8 oz (92.4 kg)   LMP 05/25/2011   BMI 34.98 kg/m    Physical Exam Constitutional:      Appearance: She is obese.  Eyes:     Conjunctiva/sclera: Conjunctivae normal.  Cardiovascular:     Rate and Rhythm: Normal rate and regular rhythm.  Pulmonary:     Effort: Pulmonary effort is normal.     Breath sounds: Normal breath sounds.  Musculoskeletal:     Right lower leg: No edema.     Left lower leg: No edema.  Lymphadenopathy:     Cervical: No cervical adenopathy.  Skin:    General: Skin is warm and dry.     Findings: Rash present.     Comments: Telangiectasias on both cheeks Livedo reticularis on both legs, no overlying skin lesions Several small superficial venous varicosities  Neurological:     Mental Status: She is alert.  Psychiatric:        Mood and Affect: Mood normal.      Musculoskeletal Exam:  Shoulders left restricted in abduction ROM, fair internal and external rotation, no focal tenderness or swelling Elbows full ROM no tenderness or  swelling Wrists full ROM no tenderness or swelling Fingers tenderness to pressure on right hand MCPs and PIPs, no palpable synovitis, 5th finger flexion reduced Knees full ROM no tenderness or swelling Ankles full ROM no tenderness or swelling MTPs full ROM no tenderness or swelling  Investigation: No additional findings.  Imaging: US  PELVIC COMPLETE WITH TRANSVAGINAL Result Date: 12/12/2023 CLINICAL DATA:  PMB EXAM: ULTRASOUND OF PELVIS TECHNIQUE: Transabdominal and transvaginalultrasound examination of the pelvis was performed including evaluation of the uterus, ovaries, adnexal regions, and pelvic cul-de-sac. COMPARISON:  10/20/2020. FINDINGS: Uterusanteverted, 7 x 4 x 2 cm. The endometrium 4 mm. Small focal echogenic process in the uterus consistent with subendometrial calcification which is a stable finding. There are no uterine masses. Right ovary Unremarkable, 2.2 x 1.5 x 1.1 cm. Left ovary Unremarkable, 2.3 x 1.6 x 1.2 cm. Images of the adnexae demonstrated no masses or fluid collections. Color Doppler demonstrated ovarian blood flow. IMPRESSION: Unremarkable examination of the pelvis. Electronically Signed   By: Fonda Field M.D.   On: 12/12/2023 00:21    Recent Labs: Lab Results  Component Value Date   WBC 8.2 10/08/2023   HGB 15.2 10/08/2023   PLT 212 10/08/2023   NA 134 10/08/2023   K 4.1 10/08/2023   CL 90 (L) 10/08/2023   CO2 30 (H) 10/08/2023   GLUCOSE 82 10/08/2023   BUN 5 (L) 10/08/2023   CREATININE 0.82 10/08/2023   BILITOT 0.4 10/08/2023   ALKPHOS 98 10/08/2023   AST 22 10/08/2023   ALT 19 10/08/2023   PROT 6.3 10/08/2023   ALBUMIN 3.8 (L) 10/08/2023   CALCIUM  8.9 10/08/2023   GFRAA 91 01/09/2020    Speciality Comments: No specialty comments available.  Procedures:  No procedures performed Allergies: Lipitor [atorvastatin ] and Naproxen    Assessment / Plan:  Visit Diagnoses: Positive ANA (antinuclear antibody) Workup is pretty reassuring overall  with no antibodies inflammatory markers and no signs of peripheral joint synovitis or dactylitis on exam.  Exam is unremarkable again today.  I do not think this requires any additional testing or scheduled follow-up for monitoring.  Joint pain and stiffness Chronic joint pain and stiffness, particularly in the shoulder, unresponsive to injections. Voltaren  gel provides some relief. Tylenol  recommended over NSAIDs due to gastritis and liver function concerns.  She has established liver disease but with good hepatic function I do not think there is any absolute contraindication.  Unfortunately pretty limited given that a lot is glenohumeral joint osteoarthritis and she is not a good surgical candidate. - Recommend conscientious use Tylenol  up to 2000 mg per day as needed for pain. - Continue Voltaren  gel for joint pain.  Numbness in hands and feet Chronic numbness in hands and feet. Previous carpal tunnel release on right wrist. No significant findings on wrist ultrasound. Symptoms do not warrant further invasive procedures.  - Provided printed carpal tunnel stretching and range of motion exercises - Consider nerve conduction study if symptoms worsen. - Using a wrist brace at night may be helpful if available.  Sciatica Chronic sciatica managed with Lyrica , recently increased to 300 mg twice daily. Reports grogginess but overall symptom improvement.        Orders: No orders of the defined types were placed in this encounter.  No orders of the defined types were placed in this encounter.    Follow-Up Instructions: Return if symptoms worsen or fail to improve.   Lonni LELON Ester, MD  Note - This record has been created using [employer].  Chart creation errors have been sought, but may not always  have been located. Such creation errors do not reflect on  the standard of medical care.

## 2023-12-18 LAB — GENECONNECT MOLECULAR SCREEN: Genetic Analysis Overall Interpretation: NEGATIVE

## 2023-12-23 ENCOUNTER — Telehealth: Admitting: Family

## 2023-12-23 DIAGNOSIS — J441 Chronic obstructive pulmonary disease with (acute) exacerbation: Secondary | ICD-10-CM | POA: Diagnosis not present

## 2023-12-23 MED ORDER — BENZONATATE 100 MG PO CAPS: 100.0000 mg | ORAL_CAPSULE | Freq: Three times a day (TID) | ORAL | 0 refills | Status: DC | PRN

## 2023-12-23 MED ORDER — PREDNISONE 10 MG (21) PO TBPK
ORAL_TABLET | ORAL | 0 refills | Status: DC
Start: 2023-12-23 — End: 2023-12-31

## 2023-12-23 MED ORDER — AZITHROMYCIN 250 MG PO TABS: ORAL_TABLET | ORAL | 0 refills | Status: DC

## 2023-12-23 NOTE — Progress Notes (Signed)
E-Visit for Cough  We are sorry that you are not feeling well.  Here is how we plan to help!  Based on your presentation I believe you most likely have A cough due to bacteria.  When patients have a fever and a productive cough with a change in color or increased sputum production, we are concerned about bacterial bronchitis.  If left untreated it can progress to pneumonia.  If your symptoms do not improve with your treatment plan it is important that you contact your provider.   I have prescribed Azithromyin 250 mg: two tablets now and then one tablet daily for 4 additonal days    In addition you may use A non-prescription cough medication called Robitussin DAC. Take 2 teaspoons every 8 hours or Delsym: take 2 teaspoons every 12 hours., A non-prescription cough medication called Mucinex DM: take 2 tablets every 12 hours., and A prescription cough medication called Tessalon Perles 100mg . You may take 1-2 capsules every 8 hours as needed for your cough.  Prednisone 10 mg daily for 6 days (see taper instructions below)  Directions for 6 day taper: Day 1: 2 tablets before breakfast, 1 after both lunch & dinner and 2 at bedtime Day 2: 1 tab before breakfast, 1 after both lunch & dinner and 2 at bedtime Day 3: 1 tab at each meal & 1 at bedtime Day 4: 1 tab at breakfast, 1 at lunch, 1 at bedtime Day 5: 1 tab at breakfast & 1 tab at bedtime Day 6: 1 tab at breakfast  From your responses in the eVisit questionnaire you describe inflammation in the upper respiratory tract which is causing a significant cough.  This is commonly called Bronchitis and has four common causes:   Allergies Viral Infections Acid Reflux Bacterial Infection Allergies, viruses and acid reflux are treated by controlling symptoms or eliminating the cause. An example might be a cough caused by taking certain blood pressure medications. You stop the cough by changing the medication. Another example might be a cough caused by acid  reflux. Controlling the reflux helps control the cough.  USE OF BRONCHODILATOR ("RESCUE") INHALERS: There is a risk from using your bronchodilator too frequently.  The risk is that over-reliance on a medication which only relaxes the muscles surrounding the breathing tubes can reduce the effectiveness of medications prescribed to reduce swelling and congestion of the tubes themselves.  Although you feel brief relief from the bronchodilator inhaler, your asthma may actually be worsening with the tubes becoming more swollen and filled with mucus.  This can delay other crucial treatments, such as oral steroid medications. If you need to use a bronchodilator inhaler daily, several times per day, you should discuss this with your provider.  There are probably better treatments that could be used to keep your asthma under control.     HOME CARE Only take medications as instructed by your medical team. Complete the entire course of an antibiotic. Drink plenty of fluids and get plenty of rest. Avoid close contacts especially the very young and the elderly Cover your mouth if you cough or cough into your sleeve. Always remember to wash your hands A steam or ultrasonic humidifier can help congestion.   GET HELP RIGHT AWAY IF: You develop worsening fever. You become short of breath You cough up blood. Your symptoms persist after you have completed your treatment plan MAKE SURE YOU  Understand these instructions. Will watch your condition. Will get help right away if you are not doing  well or get worse.    Thank you for choosing an e-visit.  Your e-visit answers were reviewed by a board certified advanced clinical practitioner to complete your personal care plan. Depending upon the condition, your plan could have included both over the counter or prescription medications.  Please review your pharmacy choice. Make sure the pharmacy is open so you can pick up prescription now. If there is a problem,  you may contact your provider through [employer] and have the prescription routed to another pharmacy.  Your safety is important to [place]. If you have drug allergies check your prescription carefully.   For the next 24 hours you can use MyChart to ask questions about today's visit, request a non-urgent call back, or ask for a work or school excuse. You will get an email in the next two days asking about your experience. I hope that your e-visit has been valuable and will speed your recovery.   Approximately 5 minutes was spent documenting and reviewing patient's chart.

## 2023-12-31 ENCOUNTER — Ambulatory Visit (INDEPENDENT_AMBULATORY_CARE_PROVIDER_SITE_OTHER): Admitting: Gastroenterology

## 2023-12-31 ENCOUNTER — Encounter: Payer: Self-pay | Admitting: Gastroenterology

## 2023-12-31 VITALS — BP 118/78 | HR 78 | Temp 98.4°F | Ht 64.0 in | Wt 203.0 lb

## 2023-12-31 DIAGNOSIS — K7460 Unspecified cirrhosis of liver: Secondary | ICD-10-CM

## 2023-12-31 DIAGNOSIS — R1310 Dysphagia, unspecified: Secondary | ICD-10-CM | POA: Diagnosis not present

## 2023-12-31 NOTE — Progress Notes (Signed)
 GI Office Note    Referring Provider: Jolinda Norene HERO, DO Primary Care Physician:  Jolinda Norene HERO, DO  Primary Gastroenterologist: Carlin POUR. Cindie, DO   Chief Complaint   Chief Complaint  Patient presents with   Follow-up    History of Present Illness   Jamie Mcgee is a 63 y.o. female presenting today for follow up. Last seen 10/2023. Diagnosis with cirrhosis in 2024.   AMA positive. ANA positive. LFTs normal on recheck. 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 Advised following labs and u/s every six months. No need for liver biopsy right now.    Immune to Hep B.   Labs 11/2023: AFP  5.1, INR 1, WBC 8.2, Hgb 15.2, Plt 212, creatinine 0.82, sodium 134, albumin 3.8, total bilirubin 0.4, alk phos 98, AST 22, ALT 19. MELD 3.0 of 10.  Ruq u/s 11/2023: IMPRESSION: Morphologic changes compatible with cirrhosis. No focal hepatic lesion identified.  Egd 11/2023: -mild Schatzki ring s/p dilated -gastritis s/p bx no h.pylori -examined duodenum normal.  Today: doing well. Notes her swallowing is only slightly improved to meats. Can swallow soft foods and pills ok. Had teeth pulled with plans for dentures but she had too much bone loss per patient. Trying to cut food finely. No abdominal pain. BMs regular. No melena, brbpr   Colonoscopy 07/2020: -non-bleeding internal hemorrhoids -next colonoscopy 10 years   Medications   Current Outpatient Medications  Medication Sig Dispense Refill   albuterol  (VENTOLIN  HFA) 108 (90 Base) MCG/ACT inhaler INHALE 2 PUFFS INTO THE LUNGS EVERY 6 HOURS AS NEEDED FOR WHEEZING OR SHORTNESS OF BREATH 18 g 5   benzonatate  (TESSALON  PERLES) 100 MG capsule Take 1 capsule (100 mg total) by mouth 3 (three) times daily as needed. 20 capsule 0   Budeson-Glycopyrrol-Formoterol  (BREZTRI  AEROSPHERE) 160-9-4.8 MCG/ACT AERO USE 2 INHALATIONS BY MOUTH  TWICE DAILY 32.1 g 7   busPIRone  (BUSPAR ) 15 MG tablet TAKE ONE TABLET BY MOUTH TWICE DAILY FOR ANXIETY 90 tablet 1   CALCIUM -VITAMIN D  PO Take 1 tablet by mouth daily.     cariprazine  (VRAYLAR ) 1.5 MG capsule Take 1 capsule (1.5 mg total) by mouth daily. 90 capsule 3   carvedilol  (COREG ) 6.25 MG tablet Take 1 tablet (6.25 mg total) by mouth 2 (two) times daily with a meal. 200 tablet 1   citalopram  (CELEXA ) 40 MG tablet Take 1 tablet (40 mg total) by mouth daily. 90 tablet 3   fexofenadine (ALLEGRA) 180 MG tablet Take 180 mg by mouth daily as needed.     fluticasone  (FLONASE ) 50 MCG/ACT nasal spray Place 2 sprays into both nostrils daily. 16 g 6   hydrochlorothiazide  (HYDRODIURIL ) 25 MG tablet Take 1 tablet (25 mg total) by mouth daily. 100 tablet 1   losartan  (COZAAR ) 100 MG tablet Take 1 tablet (100 mg total) by mouth daily. 100 tablet 1   Misc Natural Products (COLON CLEANSE PO) Take by mouth.     pantoprazole  (PROTONIX ) 20 MG tablet TAKE 1 TABLET BY MOUTH DAILY FOR ACID REFLUX 100 tablet 1   pregabalin  (LYRICA ) 300 MG capsule Take 1 capsule (300 mg total) by mouth 2 (two) times daily. 180 capsule 1   rosuvastatin  (CRESTOR ) 5 MG tablet Take 1 tablet (5 mg total) by mouth at bedtime. 100 tablet 1   No current facility-administered medications for this visit.    Allergies  Allergies as of 12/31/2023 - Review Complete 12/31/2023  Allergen Reaction Noted   Lipitor [atorvastatin ] Other (See Comments) 10/13/2014   Naproxen  Nausea And Vomiting 06/04/2015       Review of Systems   General: Negative for anorexia, weight loss, fever, chills, fatigue, weakness. ENT: Negative for hoarseness, difficulty swallowing , nasal congestion. CV: Negative for chest pain, angina, palpitations, dyspnea on exertion, peripheral edema.  Respiratory: Negative for dyspnea at rest, dyspnea on exertion, cough, sputum, wheezing.  GI: See history of present illness. GU:  Negative for dysuria, hematuria,  urinary incontinence, urinary frequency, nocturnal urination.  Endo: Negative for unusual weight change.     Physical Exam   BP 118/78 (BP Location: Right Arm, Patient Position: Sitting, Cuff Size: Large)   Pulse 78   Temp 98.4 F (36.9 C) (Oral)   Ht 5' 4 (1.626 m)   Wt 203 lb (92.1 kg)   LMP 05/25/2011   SpO2 95%   BMI 34.84 kg/m    General: Well-nourished, well-developed in no acute distress.  Eyes: No icterus. Mouth: Oropharyngeal mucosa moist and pink   Lungs: Clear to auscultation bilaterally.  Heart: Regular rate and rhythm, no murmurs rubs or gallops.  Abdomen: Bowel sounds are normal, nontender, nondistended, no hepatosplenomegaly or masses,  no abdominal bruits or hernia , no rebound or guarding.  Rectal: not performed Extremities: No lower extremity edema. No clubbing or deformities. Neuro: Alert and oriented x 4   Skin: Warm and dry, no jaundice.   Psych: Alert and cooperative, normal mood and affect.  Labs   See hpi Imaging Studies   US  PELVIC COMPLETE WITH TRANSVAGINAL Result Date: 12/12/2023 CLINICAL DATA:  PMB EXAM: ULTRASOUND OF PELVIS TECHNIQUE: Transabdominal and transvaginalultrasound examination of the pelvis was performed including evaluation of the uterus, ovaries, adnexal regions, and pelvic cul-de-sac. COMPARISON:  10/20/2020. FINDINGS: Uterusanteverted, 7 x 4 x 2 cm. The endometrium 4 mm. Small focal echogenic process in the uterus consistent with subendometrial calcification which is a stable finding. There are no uterine masses. Right ovary Unremarkable, 2.2 x 1.5 x 1.1 cm. Left ovary Unremarkable, 2.3 x 1.6 x 1.2 cm. Images of the adnexae demonstrated no masses or fluid collections. Color Doppler demonstrated ovarian blood flow. IMPRESSION: Unremarkable examination of the pelvis. Electronically Signed   By: Fonda Field M.D.   On: 12/12/2023 00:21    Assessment/Plan:     Cirrhosis:?etiology. 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 Given normal LFTs, no targeted treatment for PBC or autoimmune disease. She has remained well compensated. MELD 3.0 of 10. Labs and u/s up to date. -ov in six months.     Esophageal dysphagia: solid food dysphagia for one year. Poor dentition likely contributing. Also had mild Schatzki ring s/p dilation. She really did not notice significant improvement in symptoms -avoid raw vegetables -finely chop meats -consume moist tender meats only    Sonny RAMAN. Ezzard, MHS, PA-C [facility]

## 2023-12-31 NOTE — Patient Instructions (Signed)
 Please continue to watch what you eat. Avoid raw hard vegetables, dry or touch meats. Finely chop your meats. If you have increasing difficulty swallowing please let me know.  We will plan to see you back in six months. We will update your labs and ultrasound at that time.

## 2024-01-03 ENCOUNTER — Other Ambulatory Visit: Payer: Self-pay | Admitting: Family Medicine

## 2024-01-03 DIAGNOSIS — J329 Chronic sinusitis, unspecified: Secondary | ICD-10-CM

## 2024-01-10 ENCOUNTER — Ambulatory Visit: Admitting: Obstetrics & Gynecology

## 2024-01-10 ENCOUNTER — Ambulatory Visit: Payer: Self-pay | Admitting: Adult Health

## 2024-01-10 ENCOUNTER — Encounter: Payer: Self-pay | Admitting: Adult Health

## 2024-01-10 ENCOUNTER — Ambulatory Visit (HOSPITAL_COMMUNITY)
Admission: RE | Admit: 2024-01-10 | Discharge: 2024-01-10 | Disposition: A | Discharge status: Home / Self Care | Source: Ambulatory Visit | Attending: Adult Health | Admitting: Adult Health

## 2024-01-10 ENCOUNTER — Ambulatory Visit: Admitting: Adult Health

## 2024-01-10 ENCOUNTER — Encounter: Payer: Self-pay | Admitting: Obstetrics & Gynecology

## 2024-01-10 ENCOUNTER — Other Ambulatory Visit (HOSPITAL_COMMUNITY)
Admission: RE | Admit: 2024-01-10 | Discharge: 2024-01-10 | Disposition: A | Discharge status: Home / Self Care | Source: Ambulatory Visit | Attending: Obstetrics & Gynecology | Admitting: Obstetrics & Gynecology

## 2024-01-10 VITALS — BP 121/72 | HR 79 | Ht 64.0 in | Wt 200.8 lb

## 2024-01-10 VITALS — BP 119/78 | HR 84 | Ht 64.0 in | Wt 200.8 lb

## 2024-01-10 DIAGNOSIS — J441 Chronic obstructive pulmonary disease with (acute) exacerbation: Secondary | ICD-10-CM

## 2024-01-10 DIAGNOSIS — R87610 Atypical squamous cells of undetermined significance on cytologic smear of cervix (ASC-US): Secondary | ICD-10-CM | POA: Diagnosis not present

## 2024-01-10 DIAGNOSIS — Z720 Tobacco use: Secondary | ICD-10-CM

## 2024-01-10 DIAGNOSIS — R059 Cough, unspecified: Secondary | ICD-10-CM | POA: Diagnosis not present

## 2024-01-10 DIAGNOSIS — R87810 Cervical high risk human papillomavirus (HPV) DNA test positive: Secondary | ICD-10-CM

## 2024-01-10 DIAGNOSIS — J431 Panlobular emphysema: Secondary | ICD-10-CM

## 2024-01-10 DIAGNOSIS — J432 Centrilobular emphysema: Secondary | ICD-10-CM | POA: Diagnosis not present

## 2024-01-10 DIAGNOSIS — N950 Postmenopausal bleeding: Secondary | ICD-10-CM

## 2024-01-10 DIAGNOSIS — R0602 Shortness of breath: Secondary | ICD-10-CM | POA: Diagnosis not present

## 2024-01-10 DIAGNOSIS — G4733 Obstructive sleep apnea (adult) (pediatric): Secondary | ICD-10-CM | POA: Diagnosis not present

## 2024-01-10 MED ORDER — ALBUTEROL SULFATE (2.5 MG/3ML) 0.083% IN NEBU
2.5000 mg | INHALATION_SOLUTION | Freq: Once | RESPIRATORY_TRACT | Status: AC
Start: 2024-01-10 — End: 2024-01-10
  Administered 2024-01-10: 2.5 mg via RESPIRATORY_TRACT

## 2024-01-10 MED ORDER — ALBUTEROL SULFATE (2.5 MG/3ML) 0.083% IN NEBU: 2.5000 mg | INHALATION_SOLUTION | Freq: Four times a day (QID) | RESPIRATORY_TRACT | 5 refills | Status: DC | PRN

## 2024-01-10 MED ORDER — IPRATROPIUM-ALBUTEROL 0.5-2.5 (3) MG/3ML IN SOLN: 3.0000 mL | Freq: Once | RESPIRATORY_TRACT | Status: DC

## 2024-01-10 NOTE — Progress Notes (Signed)
 Patient name: KADY TOOTHAKER MRN [id number]  Date of birth: 1960/12/14 Chief Complaint:   Colposcopy (Endometrial biopsy)  History of Present Illness:   JESSABELLE MARKIEWICZ is a 63 y.o. [id number] PM female being seen today for cervical dysplasia management and the following concerns:  PMB: She notes spotting- once every 6mos for at least the past 10 yrs.  Happens spontaneously- light pink when she wipes and then nothing.  She also notes occasional pelvic pain.  Cervical dysplasia: Patient notes history of dysplasia and prior colposcopies in the past.  She denies prior history of LEEP/ablation.  Prior dysplasia workup completed at outside facility.  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   New sexual partner:  No.    Patient's last menstrual period was 05/25/2011.     12/03/2023    9:38 AM 11/20/2023    1:03 PM 11/20/2023   12:55 PM 07/03/2023    9:00 AM 04/12/2023    3:25 PM  Depression screen PHQ 2/9  Decreased Interest 1 3 0 0 0  Down, Depressed, Hopeless 2 3 0 0 0  PHQ - 2 Score 3 6 0 0 0  Altered sleeping 2 3 0  0  Tired, decreased energy 3 3 0  2  Change in appetite 3 3 0  0  Feeling bad or failure about yourself  0 1 0  0  Trouble concentrating 1 2 0  0  Moving slowly or fidgety/restless 0 0 0  0  Suicidal thoughts 0 0 0  0  PHQ-9 Score 12 18 0  2  Difficult doing work/chores  Somewhat difficult   Not difficult at all     Review of Systems:   Pertinent items are noted in HPI Denies fever/chills, dizziness, headaches, visual disturbances, fatigue, shortness of breath, chest pain, abdominal pain, vomiting. Pertinent History Reviewed:  Reviewed past medical,surgical, social, obstetrical and family history.  Reviewed problem list, medications and allergies. Physical Assessment:   Vitals:   01/10/24 0953  BP: 121/72  Pulse: 79  Weight: 200 lb 12.8 oz (91.1 kg)  Height:  5' 4 (1.626 m)  Body mass index is 34.47 kg/m.       Physical Examination:   General appearance: alert, well appearing, and in no distress  Psych: mood appropriate, normal affect  Skin: warm & dry   Cardiovascular: normal heart rate noted  Respiratory: normal respiratory effort, no distress  Abdomen:obese, soft, non-tender   Pelvic: Normal external genitalia.  Pink moist vaginal mucosa no discharge or lesions appreciated.  Small speculum used.  Cervix visualized see below colposcopy section  Extremities: no edema   Chaperone: Aleck Blase     Colposcopy Procedure Note  Indications: ASC-H, HPV+ and postmenopausal bleeding  Procedure Details  The risks and benefits of the procedure and Written informed consent obtained.  Speculum placed in vagina and excellent visualization of cervix achieved, cervix swabbed x 3 with acetic acid solution.  Findings: Adequate colposcopy is noted today.  TMZ zone difficulty to visualize- suspect within cervical canal  Cervix: no visible lesions; ECC and cervical biopsies obtained.    Monsel's applied.  Adequate hemostasis noted  Specimens: ECC, 12 o'clock and EMB  Complications: pain.  Colposcopic Impression:  no abnormalities seen     Following colposcopy, a single tooth tenaculum was applied to the anterior lip of the cervix for stabilization.  Os finder  was used.  A Pipelle endometrial aspirator was used to sample the endometrium.  Sample was sent for pathologic examination-minimal sample obtained due to patient discomfort.  A second pass of the pipette could not be completed.  Condition: Stable   Plan(Based on 2019 ASCCP recommendations)  -Discussed HPV- reviewed incidence and its potential to cause condylomas to dysplasia to cervical cancer -Reviewed degree of abnormal pap smears  -Discussed ASCCP guidelines and current recommendations for colposcopy -As above, inform consent obtained and procedure completed. Both ECC and EMB  obtained, further management pending results -Based on concern for potential and adequacy of sampling.  Briefly discussed potential plan for LEEP with hysteroscopy D&C.  Patient notes wanting to just have a hysterectomy.  Discussed that while a hysterectomy seems more permanent and addressing this issue-we need to look at the risk and benefits of a procedure like that including potential risk of bleeding, infection, injury and surgical complications.  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 -Questions and concerns were addressed and will await results of today's biopsies to review next step  -Also encouraged tobacco cessation as this is a risk factor for cervical dysplasia  Yaretzi Ernandez, DO Attending Obstetrician & Gynecologist, [facility] [employer], [facility]

## 2024-01-10 NOTE — Patient Instructions (Addendum)
 Chest xray today.  Continue on BREZTRI  inhaler 2 puffs Twice daily, rinse after use.  Albuterol  inhaler As needed   May try Albuterol  neb As needed  -order sent for neb machine  Continue to work on not smoking.  Robitussin DM As needed   Follow up with Dr. Catherine in 4-6 months with PFT and As needed   Please contact office for sooner follow up if symptoms do not improve or worsen or seek emergency care

## 2024-01-10 NOTE — Progress Notes (Signed)
 @Patient  ID: Jamie Mcgee, female    DOB: 10/06/1960, 62 y.o.   MRN: [id number]  Chief Complaint  Patient presents with   Sleep Apnea    Can not tolerate cpap / states breztri  is not working for her     Referring provider: Jolinda Norene HERO, DO  HPI: 63 yo female smoker followed for COPD and OSA-CPAP intolerant  TEST/EVENTS :  LDCT chest 07/2023 Emphysema, stable right lung nodules, RAD2  LDCT chest 06/2022 RADS 2, no new nodules LDCT chest 11/2020 paraseptal emphysema, right upper lobe 4 mm nodule, nodular liver   03/2021 PFTs show moderate restriction, ratio 75, FEV1 1.37/54%, FVC 56%, DLCO 11.5/56%   12/2017 PFTs ratio 73, FEV1 1.41/54%, FVC 57%, no bronchodilator response, TLC 103%, DLCO 80%.  09/2017 spirometry moderate restriction, ratio 76, FEV1 54%, FVC 55%   07/2017 spirometry moderate airway obstruction, ratio 65, FEV1 38%, FVC  46%     02/2021 HST mod  OSA with AHI 20/ hr  N PSG 01/2018-206 pounds-AHI 7/hour lowest desaturation 88%  01/10/2024 Follow up : COPD and OSA Discussed the use of AI scribe software for clinical note transcription with the patient, who gave verbal consent to proceed.  History of Present Illness Jamie Mcgee is a 62 year old female with COPD and obstructive sleep apnea who presents for a one-year follow-up.  She reports increased use of her albuterol  inhaler and persistent wheezing. She has had bronchitis for five weeks, treated with antibiotics and prednisone  by her primary doctor. She felt better after completing the antibiotics, but not back to her baseline. . The prednisone  provided significant relief, but she experienced insomnia while on it.  She continues to have a non-productive cough aShe uses over-the-counter cough and congestion medication every four hours to manage her symptoms. She reports that the Breztri  inhaler, which she uses twice daily, is not as effective as before.  She is an active smoker and has been unable to  quit due to living with a smoker. She previously quit for a year but resumed smoking due to her living situation and financial constraints. Her social history includes living in [place] and being on disability, which impacts her ability to afford living independently. Discussed smoking cessation   She has a history of moderate sleep apnea but is CPAP intolerant.    Allergies  Allergen Reactions   Lipitor [Atorvastatin ] Other (See Comments)    Muscle aches and nausea   Naproxen  Nausea And Vomiting    Stomach pain    Immunization History  Administered Date(s) Administered   Fluad Quad(high Dose 65+) 04/06/2022   Influenza,inj,Quad PF,6+ Mos 03/15/2018, 03/10/2020   Moderna Sars-Covid-2 Vaccination 10/06/2019   PNEUMOCOCCAL CONJUGATE-20 04/13/2021   Pneumococcal Polysaccharide-23 01/04/2018   Tdap 01/04/2018   Zoster Recombinant(Shingrix) 10/11/2020, 03/24/2021    Past Medical History:  Diagnosis Date   Allergy    Anxiety    Arthritis    Binge eating disorder    Carpal tunnel syndrome on right    Cataract    Both lenses been replaced   Cervical stenosis of spine    C5-6   COPD (chronic obstructive pulmonary disease) (HCC)    Gold III   Depression    Emphysema of lung (HCC)    Encounter for screening fecal occult blood testing 11/29/2022   Fibromuscular dysplasia of renal artery (HCC) 2006   GERD (gastroesophageal reflux disease)    Headache    migraines 20 years ago   Hepatitis  type A in 1977   HNP (herniated nucleus pulposus), cervical    HPV (human papilloma virus) infection    HTN (hypertension)    Neuropathy    Sleep apnea    Vitamin D  deficiency     Tobacco History: Social History   Tobacco Use  Smoking Status Every Day   Current packs/day: 0.00   Average packs/day: 1 pack/day for 40.0 years (40.0 ttl pk-yrs)   Types: Cigarettes   Start date: 11/01/1981   Last attempt to quit: 11/01/2021   Years since quitting: 2.1   Passive exposure: Current   Smokeless Tobacco Never  Tobacco Comments   Quit 11/15/21   Ready to quit: Not Answered Counseling given: Not Answered Tobacco comments: Quit 11/15/21   Outpatient Medications Prior to Visit  Medication Sig Dispense Refill   albuterol  (VENTOLIN  HFA) 108 (90 Base) MCG/ACT inhaler INHALE 2 PUFFS INTO THE LUNGS EVERY 6 HOURS AS NEEDED FOR WHEEZING OR SHORTNESS OF BREATH 18 g 5   benzonatate  (TESSALON  PERLES) 100 MG capsule Take 1 capsule (100 mg total) by mouth 3 (three) times daily as needed. 20 capsule 0   Budeson-Glycopyrrol-Formoterol  (BREZTRI  AEROSPHERE) 160-9-4.8 MCG/ACT AERO USE 2 INHALATIONS BY MOUTH TWICE DAILY 32.1 g 7   busPIRone  (BUSPAR ) 15 MG tablet TAKE ONE TABLET BY MOUTH TWICE DAILY FOR ANXIETY 90 tablet 1   CALCIUM -VITAMIN D  PO Take 1 tablet by mouth daily.     cariprazine  (VRAYLAR ) 1.5 MG capsule Take 1 capsule (1.5 mg total) by mouth daily. 90 capsule 3   carvedilol  (COREG ) 6.25 MG tablet Take 1 tablet (6.25 mg total) by mouth 2 (two) times daily with a meal. 200 tablet 1   citalopram  (CELEXA ) 40 MG tablet Take 1 tablet (40 mg total) by mouth daily. 90 tablet 3   fexofenadine (ALLEGRA) 180 MG tablet Take 180 mg by mouth daily as needed.     fluticasone  (FLONASE ) 50 MCG/ACT nasal spray SHAKE LIQUID AND USE 2 SPRAYS IN EACH NOSTRIL DAILY 16 g 6   hydrochlorothiazide  (HYDRODIURIL ) 25 MG tablet Take 1 tablet (25 mg total) by mouth daily. 100 tablet 1   losartan  (COZAAR ) 100 MG tablet Take 1 tablet (100 mg total) by mouth daily. 100 tablet 1   Misc Natural Products (COLON CLEANSE PO) Take by mouth.     pantoprazole  (PROTONIX ) 20 MG tablet TAKE 1 TABLET BY MOUTH DAILY FOR ACID REFLUX 100 tablet 1   pregabalin  (LYRICA ) 300 MG capsule Take 1 capsule (300 mg total) by mouth 2 (two) times daily. 180 capsule 1   rosuvastatin  (CRESTOR ) 5 MG tablet Take 1 tablet (5 mg total) by mouth at bedtime. 100 tablet 1   No facility-administered medications prior to visit.     Review of  Systems:   Constitutional:   No  weight loss, night sweats,  Fevers, chills, +fatigue, or  lassitude.  HEENT:   No headaches,  Difficulty swallowing,  Tooth/dental problems, or  Sore throat,                No sneezing, itching, ear ache, nasal congestion, post nasal drip,   CV:  No chest pain,  Orthopnea, PND, swelling in lower extremities, anasarca, dizziness, palpitations, syncope.   GI  No heartburn, indigestion, abdominal pain, nausea, vomiting, diarrhea, change in bowel habits, loss of appetite, bloody stools.   Resp:.  No wheezing.  No chest wall deformity  Skin: no rash or lesions.  GU: no dysuria, change in color of urine, no urgency or  frequency.  No flank pain, no hematuria   MS:  No joint pain or swelling.  No decreased range of motion.  No back pain.    Physical Exam  BP 119/78   Pulse 84   Ht 5' 4 (1.626 m)   Wt 200 lb 12.8 oz (91.1 kg)   LMP 05/25/2011   SpO2 96% Comment: ra  BMI 34.47 kg/m   GEN: A/Ox3; pleasant , NAD, well nourished    HEENT:  [place]/AT,   NOSE-clear, THROAT-clear, no lesions, no postnasal drip or exudate noted.   NECK:  Supple w/ fair ROM; no JVD; normal carotid impulses w/o bruits; no thyromegaly or nodules palpated; no lymphadenopathy.    RESP  Clear  P & A; w/o, wheezes/ rales/ or rhonchi. no accessory muscle use, no dullness to percussion  CARD:  RRR, no m/r/g, no peripheral edema, pulses intact, no cyanosis or clubbing.  GI:   Soft & nt; nml bowel sounds; no organomegaly or masses detected.   Musco: Warm bil, no deformities or joint swelling noted.   Neuro: alert, no focal deficits noted.    Skin: Warm, no lesions or rashes    Lab Results:  CBC    BNP  ProBNP No results found for: PROBNP  Imaging: No results found.  Administration History     None          Latest Ref Rng & Units 03/08/2021   11:49 AM 12/28/2017    8:55 AM  PFT Results  FVC-Pre L 1.84  1.94   FVC-Predicted Pre % 56  57   FVC-Post L 1.76   1.90   FVC-Predicted Post % 53  56   Pre FEV1/FVC % % 75  73   Post FEV1/FCV % % 76  74   FEV1-Pre L 1.37  1.41   FEV1-Predicted Pre % 54  54   FEV1-Post L 1.34  1.41   DLCO uncorrected ml/min/mmHg 11.48  19.47   DLCO UNC% % 56  80   DLVA Predicted % 75  100   TLC L 4.91  5.20   TLC % Predicted % 97  103   RV % Predicted % 147  157     No results found for: NITRICOXIDE      Assessment & Plan:   No problem-specific Assessment & Plan notes found for this encounter. Assessment and Plan Assessment & Plan Chronic obstructive pulmonary disease (COPD) with acute exacerbation and emphysema  -resolving . Completed course of antibiotics and steroids.  COPD exacerbation likely due to ongoing tobacco use. Jamie Mcgee Recent exacerbation treated with antibiotics and prednisone  showed partial improvement. Persistent wheezing and productive cough continue, with possible pneumonia considered. Order chest x-ray to rule out pneumonia. Continue Breztri  inhaler twice daily. Prescribe at-home nebulizer for flare-ups and administer in-office nebulizer treatment with albuterol . Encourage over-the-counter cough and congestion medication every four hours. Hold on additional antibiotics and steroids at this time. Check PFT on return   Acute bronchitis, resolving   Acute bronchitis in the fifth week, partially resolved after antibiotics and prednisone . Persistent wheezing and loss of voice remain. Improvement noted post-antibiotics, though adverse effects were experienced. Smoking cessation discussed. Chest xray today.  Cough control discussed .   Obstructive sleep apnea, moderate, CPAP intolerant   Moderate obstructive sleep apnea with CPAP intolerance.  Tobacco use disorder, current   Current tobacco use disorder with relapse due to living with a smoker and financial constraints. Provide smoking cessation resources and support.   Plan  Patient Instructions  Chest xray today.  Continue on BREZTRI  inhaler 2  puffs Twice daily, rinse after use.  Albuterol  inhaler As needed   May try Albuterol  neb As needed  -order sent for neb machine  Continue to work on not smoking.  Robitussin DM As needed   Follow up with Dr. Catherine in 4-6 months with PFT and As needed   Please contact office for sooner follow up if symptoms do not improve or worsen or seek emergency care            Madelin Stank, NP 01/10/2024

## 2024-01-12 LAB — SURGICAL PATHOLOGY

## 2024-01-14 ENCOUNTER — Ambulatory Visit: Payer: Self-pay | Admitting: Obstetrics & Gynecology

## 2024-01-18 NOTE — Telephone Encounter (Signed)
 [facility] will you please see what the problem is with the neb order? I see they received it, but they told pt they did not. Thanks!

## 2024-01-21 NOTE — Telephone Encounter (Signed)
 I spoke to [facility]'s representative about this Nebulizer order. We did fax it over to their location on 01/14/24 but they have not received it. I informed the representative about the patient's insurance and it would have private pay instead of being billed by their insurance. I received confirmation by [facility]'s Representative that it would be fine to switch over the Nebulizer order to [facility].    I left a message for the patient so they are aware that we are switching over to [facility] for the Nebulizer.

## 2024-02-11 ENCOUNTER — Ambulatory Visit: Admitting: Obstetrics & Gynecology

## 2024-02-11 ENCOUNTER — Encounter: Payer: Self-pay | Admitting: Obstetrics & Gynecology

## 2024-02-11 VITALS — BP 125/77 | HR 76 | Ht 64.0 in | Wt 200.4 lb

## 2024-02-11 DIAGNOSIS — N950 Postmenopausal bleeding: Secondary | ICD-10-CM

## 2024-02-11 DIAGNOSIS — Z720 Tobacco use: Secondary | ICD-10-CM

## 2024-02-11 DIAGNOSIS — N871 Moderate cervical dysplasia: Secondary | ICD-10-CM

## 2024-02-11 DIAGNOSIS — I10 Essential (primary) hypertension: Secondary | ICD-10-CM

## 2024-02-11 NOTE — Progress Notes (Signed)
 GYN VISIT Patient name: Jamie Mcgee MRN [id number]  Date of birth: Mar 07, 1961 Chief Complaint:   Follow-up (Endometrial biopsy results)  History of Present Illness:   Jamie Mcgee is a 62 y.o. 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  Patient notes longstanding history of ASCUS, HPV positive with no prior excisional procedure.  Additionally she had reported that typically about once every 6 months she will have an episode of light pink spotting.  An endometrial biopsy was completed at her last visit, which was negative, but showed scant atrophic tissue.  She denies any vaginal bleeding since her last appointment.  She reports no acute GYN concerns  Patient's last menstrual period was 05/25/2011.    Review of Systems:   Pertinent items are noted in HPI Denies fever/chills, dizziness, headaches, visual disturbances, fatigue, shortness of breath, chest pain, abdominal pain, vomiting  Past Surgical History:  Procedure Laterality Date   ANTERIOR CERVICAL DECOMP/DISCECTOMY FUSION N/A 10/05/2017   Procedure: C5-6 ANTERIOR CERVICAL DECOMPRESSION/DISCECTOMY FUSION,  ALLOGRAFT, PLATE  (NECK FIRST AND CARPAL TUNNEL RELEASE SECOND);  Surgeon: Barbarann Oneil BROCKS, MD;  Location: [facility] OR;  Service: Orthopedics;  Laterality: N/A;   APPENDECTOMY  1993   BREAST BIOPSY Right    CARPAL TUNNEL RELEASE Right 10/05/2017   Procedure: RIGHT CARPAL TUNNEL RELEASE;  Surgeon: Barbarann Oneil BROCKS, MD;  Location: [facility] OR;  Service: Orthopedics;  Laterality: Right;   CATARACT EXTRACTION  08/2010   left    CATARACT EXTRACTION W/PHACO  06/01/2011   Procedure: CATARACT EXTRACTION PHACO AND INTRAOCULAR LENS PLACEMENT (IOC);  Surgeon: Cherene Mania;  Location: [facility] ORS;  Service: Ophthalmology;  Laterality: Right;  CDE:9.62   CHOLECYSTECTOMY     COLONOSCOPY WITH PROPOFOL  N/A 07/06/2020   Procedure:  COLONOSCOPY WITH PROPOFOL ;  Surgeon: Cindie Carlin MARLA, DO;  Location: [facility]COPY;  Service: Endoscopy;  Laterality: N/A;  11:00am   ESOPHAGEAL DILATION N/A 11/05/2023   Procedure: DILATION, ESOPHAGUS;  Surgeon: Cindie Carlin MARLA, DO;  Location: [facility] ENDO SUITE;  Service: Endoscopy;  Laterality: N/A;   ESOPHAGOGASTRODUODENOSCOPY N/A 11/05/2023   Procedure: EGD (ESOPHAGOGASTRODUODENOSCOPY);  Surgeon: Cindie Carlin MARLA, DO;  Location: [facility] ENDO SUITE;  Service: Endoscopy;  Laterality: N/A;  8:00 am, asa 3   EYE SURGERY     Cataract removal   RENAL ARTERY ANGIOPLASTY  2006   right   TUBAL LIGATION  1984   [facility]   UPPER GASTROINTESTINAL ENDOSCOPY  10/2023    Past Medical History:  Diagnosis Date   Allergy    Anxiety    Arthritis    Binge eating disorder    Carpal tunnel syndrome on right    Cataract    Both lenses been replaced   Cervical stenosis of spine    C5-6   COPD (chronic obstructive pulmonary disease) (HCC)    Gold III   Depression    Emphysema of lung (HCC)    Encounter for screening fecal occult blood testing 11/29/2022   Fibromuscular dysplasia of renal artery (HCC) 2006   GERD (gastroesophageal reflux disease)    Headache    migraines 20 years ago   Hepatitis    type A in 1977   HNP (herniated nucleus pulposus), cervical    HPV (human papilloma virus) infection    HTN (hypertension)    Neuropathy    Sleep apnea    Vitamin D  deficiency  Reviewed problem list, medications and allergies. Physical Assessment:   Vitals:   02/11/24 0852  BP: 125/77  Pulse: 76  Weight: 200 lb 6.4 oz (90.9 kg)  Height: 5' 4 (1.626 m)  Body mass index is 34.4 kg/m.       Physical Examination:   General appearance: alert, well appearing, and in no distress  Psych: mood appropriate, normal affect  Skin: warm & dry   Cardiovascular: normal heart rate noted  Respiratory: normal respiratory effort, no distress  Abdomen: soft, non-tender   Pelvic: examination not  indicated  Extremities: no edema   Chaperone: N/A    Assessment & Plan:  1) cervical dysplasia, CIN2 - Reviewed ASCCP guidelines and recommendation to proceed with LEEP - Discussed risk benefit of procedure, patient desires to proceed at next available  2) PMB - While endometrial biopsy was negative concern for scant/inadequate specimen - Recommendation for hysteroscopy, possible D&C as part of procedure -risk/benefit reviewed including benefit of further evaluation of endometrial lining - Discussed risks including but not limited to risk of bleeding, infection such as yeast or BV  -Reviewed medication list, patient denies taking GLP-1 - Plan to schedule in next available, likely September 16 Surgical referral created  Orders Placed This Encounter  Procedures   Ambulatory Referral For Surgery Scheduling    Return in about 1 week (around 02/18/2024) for with Dr. Ivon Roedel post from 9/16.   Brytnee Bechler, DO Attending Obstetrician & Gynecologist, [facility] for [employer], [facility]

## 2024-02-14 NOTE — Patient Instructions (Signed)
 Your procedure is scheduled on: 02/19/2024  Report to [facility] Main Entrance at  8:40   AM.  Call this number if you have problems the morning of surgery: [phone]   Remember:   Do not Eat or Drink after midnight         No Smoking the morning of surgery  :  Take these medicines the morning of surgery with A SIP OF WATER: Buspirone , carvedilol , celexa , pantoprazole  and Lyrica    Do not wear jewelry, make-up or nail polish.  Do not wear lotions, powders, or perfumes. You may wear deodorant.  Do not shave 48 hours prior to surgery. Men may shave face and neck.  Do not bring valuables to the hospital.  Contacts, dentures or bridgework may not be worn into surgery.  Leave suitcase in the car. After surgery it may be brought to your room.  For patients admitted to the hospital, checkout time is 11:00 AM the day of discharge.   Patients discharged the day of surgery will not be allowed to drive home.    Special Instructions: Shower using CHG night before surgery and shower the day of surgery use CHG.  Use special wash - you have one bottle of CHG for all showers.  You should use approximately 1/2 of the bottle for each shower. How to Use Chlorhexidine  at Home in the Shower Chlorhexidine  gluconate (CHG) is a germ-killing (antiseptic) wash that's used to clean the skin. It can get rid of the germs that normally live on the skin and can keep them away for about 24 hours. If you're having surgery, you may be told to shower with CHG at home the night before surgery. This can help lower your risk for infection. To use CHG wash in the shower, follow the steps below. Supplies needed: CHG body wash. Clean washcloth. Clean towel. How to use CHG in the shower Follow these steps unless you're told to use CHG in a different way: Start the shower. Use your normal soap and shampoo to wash your face and hair. Turn off the shower or move out of the shower stream. Pour CHG onto a clean washcloth.  Do not use any type of brush or rough sponge. Start at your neck, washing your body down to your toes. Make sure you: Wash the part of your body where the surgery will be done for at least 1 minute. Do not scrub. Do not use CHG on your head or face unless your health care provider tells you to. If it gets into your ears or eyes, rinse them well with water. Do not wash your genitals with CHG. Wash your back and under your arms. Make sure to wash skin folds. Let the CHG sit on your skin for 1-2 minutes or as long as told. Rinse your entire body in the shower, including all body creases and folds. Turn off the shower. Dry off with a clean towel. Do not put anything on your skin afterward, such as powder, lotion, or perfume. Put on clean clothes or pajamas. If it's the night before surgery, sleep in clean sheets. General tips Use CHG only as told, and follow the instructions on the label. Use the full amount of CHG as told. This is often one bottle. Do not smoke and stay away from flames after using CHG. Your skin may feel sticky after using CHG. This is normal. The sticky feeling will go away as the CHG dries. Do not use CHG: If you have a  chlorhexidine  allergy or have reacted to chlorhexidine  in the past. On open wounds or areas of skin that have broken skin, cuts, or scrapes. On babies younger than 62 months of age. Contact a health care provider if: You have questions about using CHG. Your skin gets irritated or itchy. You have a rash after using CHG. You swallow any CHG. Call your local poison control center [phone] in the [place] Your eyes itch badly, or they become very red or swollen. Your hearing changes. You have trouble seeing. If you can't reach your provider, go to an urgent care or emergency room. Do not drive yourself. Get help right away if: You have swelling or tingling in your mouth or throat. You make high-pitched whistling sounds when you breathe, most often when  you breathe out (wheeze). You have trouble breathing. These symptoms may be an emergency. Call 911 right away. Do not wait to see if the symptoms will go away. Do not drive yourself to the hospital. This information is not intended to replace advice given to you by your health care provider. Make sure you discuss any questions you have with your health care provider. Document Revised: 12/05/2022 Document Reviewed: 12/01/2021 Elsevier Patient Education  2024 [employer] Loop Electrosurgical Excision Procedure Loop electrosurgical excision procedure (LEEP) is the cutting and removal (excision) of tissue from the cervix. The cervix is the bottom part of the uterus that opens into the vagina. The tissue that is removed from the cervix is examined to see if there are cancer cells or cells that might turn into cancer (precancerous cells). LEEP may be done when: You have abnormal bleeding from your cervix. You have an abnormal Pap test result. Your health care provider finds abnormalities on your cervix during an exam. LEEP typically only takes a few minutes and is often done in the health care provider's office. The procedure is safe for women who are trying to get pregnant. The procedure is usually not done during a menstrual period or during pregnancy. Tell a health care provider about: Any allergies you have. All medicines you are taking, including vitamins, herbs, eye drops, creams, and over-the-counter medicines. Any problems you or family members have had with anesthetic medicines. Any bleeding problems you have. Any medical conditions you have or have had. This includes current or past vaginal infections, such as herpes or STIs (sexually transmitted infections). Whether you are pregnant or may be pregnant. If you are having vaginal bleeding on the day of the procedure. What are the risks? Generally, this is a safe procedure. However, problems may occur,  including: Infection. Bleeding. Allergic reactions to medicines. Changes or scarring in the cervix. Damage to nearby structures or organs. Increased risk of early (preterm) labor in future pregnancies. What happens before the procedure? Ask your health care provider about: Changing or stopping your regular medicines. This is especially important if you are taking diabetes medicines or blood thinners. Taking medicines such as aspirin  and ibuprofen . These medicines can thin your blood. Do not take these medicines unless your health care provider tells you to take them. Taking over-the-counter medicines, vitamins, herbs, and supplements. Your health care provider may recommend that you take pain medicine before the procedure. Ask your health care provider if you should plan to have a responsible adult take you home after the procedure. What happens during the procedure?  An instrument called a speculum will be placed in your vagina. This will allow your health care provider to see your cervix. You will  be given a medicine to numb the area (local anesthetic). The medicine will be injected into your cervix and the surrounding area. A solution will be applied to your cervix. This solution will help the health care provider find the abnormal cells that need to be removed. A thin wire loop will be passed through your vagina to your cervix. The wire will remove layers of abnormal cervical cells. The wire will burn (cauterize) the cervical tissue with an electrical current during cell removal. Open blood vessels will be cauterized to prevent bleeding. You might feel some pressure, aching, and cramping. If you feel like you will faint during the procedure, tell your health care provider right away. A paste may be applied to the cauterized area of your cervix to help control bleeding. The sample of cervical tissue will be sent to a lab and looked at under a microscope. The procedure may vary among  health care providers and hospitals. What can I expect after the procedure? After the procedure, it is common to have: Mild abdominal cramps that may last for up to 1 week. A small amount of pink-tinged or bloody vaginal discharge, including light to moderate bleeding, for 1-2 weeks. A brown- or black-colored discharge coming from your vagina, if a paste was used on the cervix to control bleeding. It is up to you to get the results of your procedure. Ask your health care provider, or the department that is doing the procedure, when your results will be ready. Follow these instructions at home: Take over-the-counter and prescription medicines only as told by your health care provider. Return to your normal activities as told by your health care provider. Ask your health care provider what activities are safe for you. Do not put anything in your vagina for 2 weeks after the procedure or until your health care provider says that it is okay. This includes tampons, creams, and douches. Do not have sex until your health care provider approves. Keep all follow-up visits. This is important. Contact a health care provider if: You have a fever or chills. You feel very weak. You have blood clots or bleeding that is heavier than a normal menstrual period. Bleeding that soaks a pad in less than 1 hour is considered heavy bleeding. You develop a bad-smelling discharge from your vagina. You have severe abdominal pain or cramping. Summary Loop electrosurgical excision procedure (LEEP) is the removal of tissue from the cervix. The removed tissue will be checked for precancerous cells or cancer cells. LEEP typically only takes a few minutes and is often done in your health care provider's office. Do not put anything in your vagina for 2 weeks after the procedure or until your health care provider says that it is okay. This includes tampons, creams, and douches. Ask your health care provider, or the department  that is doing the procedure, when your results will be ready. This information is not intended to replace advice given to you by your health care provider. Make sure you discuss any questions you have with your health care provider. Document Revised: 10/27/2020 Document Reviewed: 10/27/2020 Elsevier Patient Education  2024 [employer] Removing Tissue From the Uterus (Dilation and Curettage): What to Know After After having a dilation and curettage to remove tissue from your uterus, it's common to have mild pain or cramps. You may also have some bleeding or spotting from the vagina. Follow these instructions at home: Activity If you were given a sedative, do not drive or use machines until  you're told it's safe. A sedative can make you sleepy. Ask if it's OK for you to lift. Do not take baths, swim, or use a hot tub until you're told it's OK. Ask if you can shower. Ask what things are safe for you to do at home. Ask when you can go back to work or school. Rest as told. Get up to take short walks many times during the day. This helps you breathe better and keeps your blood flowing. Ask for help if you feel weak or unsteady. General instructions  Take your medicines only as told. If you're starting birth control after the procedure, ask your doctor when you should start it. Do not smoke, vape, or use nicotine  or tobacco. Doing this can slow down healing. For as long as told, do not: Douche. Use tampons. Have sex. Follow up with your doctor as told. You may need to call or meet with your doctor to get your results. Your doctor may give you more instructions. Make sure you know what you can and can't do. Contact a doctor if: You have very bad cramps that get worse or do not get better with medicine. You have pain in your belly that's new or gets worse. You have fluid from your vagina that smells bad. You feel dizzy or light-headed. Get help right away if: You are bleeding a lot from your  vagina. This means soaking more than one sanitary pad in 1 hour, and this happens for 2 hours in a row. You pass large clots from your vagina. You have a fever. Your belly feels very tender or hard. You have chest pain. You have shortness of breath. You faint. These symptoms may be an emergency. Call 911 right away. Do not wait to see if the symptoms will go away. Do not drive yourself to the hospital. This information is not intended to replace advice given to you by your health care provider. Make sure you discuss any questions you have with your health care provider. Document Revised: 03/09/2023 Document Reviewed: 03/09/2023 Elsevier Patient Education  2025 [employer]. General Anesthesia, Adult, Care After The following information offers guidance on how to care for yourself after your procedure. Your health care provider may also give you more specific instructions. If you have problems or questions, contact your health care provider. What can I expect after the procedure? After the procedure, it is common for people to: Have pain or discomfort at the IV site. Have nausea or vomiting. Have a sore throat or hoarseness. Have trouble concentrating. Feel cold or chills. Feel weak, sleepy, or tired (fatigue). Have soreness and body aches. These can affect parts of the body that were not involved in surgery. Follow these instructions at home: For the time period you were told by your health care provider:  Rest. Do not participate in activities where you could fall or become injured. Do not drive or use machinery. Do not drink alcohol. Do not take sleeping pills or medicines that cause drowsiness. Do not make important decisions or sign legal documents. Do not take care of children on your own. General instructions Drink enough fluid to keep your urine pale yellow. If you have sleep apnea, surgery and certain medicines can increase your risk for breathing problems. Follow  instructions from your health care provider about wearing your sleep device: Anytime you are sleeping, including during daytime naps. While taking prescription pain medicines, sleeping medicines, or medicines that make you drowsy. Return to your normal activities  as told by your health care provider. Ask your health care provider what activities are safe for you. Take over-the-counter and prescription medicines only as told by your health care provider. Do not use any products that contain nicotine  or tobacco. These products include cigarettes, chewing tobacco, and vaping devices, such as e-cigarettes. These can delay incision healing after surgery. If you need help quitting, ask your health care provider. Contact a health care provider if: You have nausea or vomiting that does not get better with medicine. You vomit every time you eat or drink. You have pain that does not get better with medicine. You cannot urinate or have bloody urine. You develop a skin rash. You have a fever. Get help right away if: You have trouble breathing. You have chest pain. You vomit blood. These symptoms may be an emergency. Get help right away. Call 911. Do not wait to see if the symptoms will go away. Do not drive yourself to the hospital. Summary After the procedure, it is common to have a sore throat, hoarseness, nausea, vomiting, or to feel weak, sleepy, or fatigue. For the time period you were told by your health care provider, do not drive or use machinery. Get help right away if you have difficulty breathing, have chest pain, or vomit blood. These symptoms may be an emergency. This information is not intended to replace advice given to you by your health care provider. Make sure you discuss any questions you have with your health care provider. Document Revised: 08/19/2021 Document Reviewed: 08/19/2021 Elsevier Patient Education  2024 [employer].

## 2024-02-15 ENCOUNTER — Encounter (HOSPITAL_COMMUNITY): Payer: Self-pay

## 2024-02-15 ENCOUNTER — Other Ambulatory Visit: Payer: Self-pay

## 2024-02-15 ENCOUNTER — Encounter (HOSPITAL_COMMUNITY)
Admission: RE | Admit: 2024-02-15 | Discharge: 2024-02-15 | Disposition: A | Discharge status: Home / Self Care | Source: Ambulatory Visit | Attending: Obstetrics & Gynecology | Admitting: Obstetrics & Gynecology

## 2024-02-15 VITALS — BP 111/76 | HR 71 | Temp 98.2°F | Resp 18 | Ht 64.0 in | Wt 200.4 lb

## 2024-02-15 DIAGNOSIS — Z01812 Encounter for preprocedural laboratory examination: Secondary | ICD-10-CM | POA: Diagnosis not present

## 2024-02-15 DIAGNOSIS — K7460 Unspecified cirrhosis of liver: Secondary | ICD-10-CM | POA: Insufficient documentation

## 2024-02-15 LAB — COMPREHENSIVE METABOLIC PANEL WITH GFR
ALT: 19 U/L (ref 0–44)
AST: 22 U/L (ref 15–41)
Albumin: 3.6 g/dL (ref 3.5–5.0)
Alkaline Phosphatase: 88 U/L (ref 38–126)
Anion gap: 13 (ref 5–15)
BUN: 6 mg/dL — ABNORMAL LOW (ref 8–23)
CO2: 27 mmol/L (ref 22–32)
Calcium: 8.9 mg/dL (ref 8.9–10.3)
Chloride: 93 mmol/L — ABNORMAL LOW (ref 98–111)
Creatinine, Ser: 0.91 mg/dL (ref 0.44–1.00)
GFR, Estimated: >60 mL/min (ref >60)
Glucose, Bld: 100 mg/dL — ABNORMAL HIGH (ref 70–99)
Potassium: 3.4 mmol/L — ABNORMAL LOW (ref 3.5–5.1)
Sodium: 133 mmol/L — ABNORMAL LOW (ref 135–145)
Total Bilirubin: 1 mg/dL (ref 0.0–1.2)
Total Protein: 7.1 g/dL (ref 6.5–8.1)

## 2024-02-15 LAB — CBC WITH DIFFERENTIAL/PLATELET
Abs Immature Granulocytes: 0.06 K/uL (ref 0.00–0.07)
Basophils Absolute: 0.1 K/uL (ref 0.0–0.1)
Basophils Relative: 0 %
Eosinophils Absolute: 0.1 K/uL (ref 0.0–0.5)
Eosinophils Relative: 1 %
HCT: 43.1 % (ref 36.0–46.0)
Hemoglobin: 15 g/dL (ref 12.0–15.0)
Immature Granulocytes: 1 %
Lymphocytes Relative: 27 %
Lymphs Abs: 3 K/uL (ref 0.7–4.0)
MCH: 30.2 pg (ref 26.0–34.0)
MCHC: 34.8 g/dL (ref 30.0–36.0)
MCV: 86.9 fL (ref 80.0–100.0)
Monocytes Absolute: 0.9 K/uL (ref 0.1–1.0)
Monocytes Relative: 8 %
Neutro Abs: 7.4 K/uL (ref 1.7–7.7)
Neutrophils Relative %: 63 %
Platelets: 239 K/uL (ref 150–400)
RBC: 4.96 MIL/uL (ref 3.87–5.11)
RDW: 13.6 % (ref 11.5–15.5)
WBC: 11.4 K/uL — ABNORMAL HIGH (ref 4.0–10.5)
nRBC: 0 % (ref 0.0–0.2)

## 2024-02-15 LAB — PROTIME-INR
INR: 0.9 (ref 0.8–1.2)
Prothrombin Time: 12.9 s (ref 11.4–15.2)

## 2024-02-15 NOTE — H&P (Incomplete)
 [facility] Attending History and Physical  Jamie Mcgee is a 63 y.o. [id number] who presents for scheduled LEEP due to high grade dysplasia and hysteroscopy, D&C due to PMB.  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  ECC showed CIN-1.  Patient notes longstanding history of ASCUS, HPV positive with no prior excisional procedure.   Additionally she had reported that typically about once every 6 months she will have an episode of light pink spotting.  An endometrial biopsy was completed at her last visit, which was negative, but showed scant atrophic tissue.   She denies any vaginal bleeding since her last appointment.  She reports no acute GYN concerns  Denies any abnormal vaginal discharge, fevers, chills, sweats, dysuria, nausea, vomiting, other GI or GU symptoms or other general symptoms.  Past Medical History:  Diagnosis Date   Allergy    Anxiety    Arthritis    Binge eating disorder    Carpal tunnel syndrome on right    Cataract    Both lenses been replaced   Cervical stenosis of spine    C5-6   COPD (chronic obstructive pulmonary disease) (HCC)    Gold III   Depression    Emphysema of lung (HCC)    Encounter for screening fecal occult blood testing 11/29/2022   Fibromuscular dysplasia of renal artery (HCC) 2006   GERD (gastroesophageal reflux disease)    Headache    migraines 20 years ago   Hepatitis    type A in 1977   HNP (herniated nucleus pulposus), cervical    HPV (human papilloma virus) infection    HTN (hypertension)    Neuropathy    Sleep apnea    Vitamin D  deficiency    Past Surgical History:  Procedure Laterality Date   ANTERIOR CERVICAL DECOMP/DISCECTOMY FUSION N/A 10/05/2017   Procedure: C5-6 ANTERIOR CERVICAL DECOMPRESSION/DISCECTOMY FUSION,  ALLOGRAFT, PLATE  (NECK FIRST AND CARPAL TUNNEL RELEASE SECOND);  Surgeon: Barbarann Oneil BROCKS, MD;  Location: [facility] OR;  Service: Orthopedics;  Laterality: N/A;    APPENDECTOMY  1993   BREAST BIOPSY Right    CARPAL TUNNEL RELEASE Right 10/05/2017   Procedure: RIGHT CARPAL TUNNEL RELEASE;  Surgeon: Barbarann Oneil BROCKS, MD;  Location: [facility] OR;  Service: Orthopedics;  Laterality: Right;   CATARACT EXTRACTION  08/2010   left    CATARACT EXTRACTION W/PHACO  06/01/2011   Procedure: CATARACT EXTRACTION PHACO AND INTRAOCULAR LENS PLACEMENT (IOC);  Surgeon: Cherene Mania;  Location: [facility] ORS;  Service: Ophthalmology;  Laterality: Right;  CDE:9.62   CHOLECYSTECTOMY     COLONOSCOPY WITH PROPOFOL  N/A 07/06/2020   Procedure: COLONOSCOPY WITH PROPOFOL ;  Surgeon: Cindie Carlin MARLA, DO;  Location: [facility] ENDOSCOPY;  Service: Endoscopy;  Laterality: N/A;  11:00am   ESOPHAGEAL DILATION N/A 11/05/2023   Procedure: DILATION, ESOPHAGUS;  Surgeon: Cindie Carlin MARLA, DO;  Location: [facility] ENDO SUITE;  Service: Endoscopy;  Laterality: N/A;   ESOPHAGOGASTRODUODENOSCOPY N/A 11/05/2023   Procedure: EGD (ESOPHAGOGASTRODUODENOSCOPY);  Surgeon: Cindie Carlin MARLA, DO;  Location: [facility] ENDO SUITE;  Service: Endoscopy;  Laterality: N/A;  8:00 am, asa 3   EYE SURGERY     Cataract removal   RENAL ARTERY ANGIOPLASTY  2006   right   TUBAL LIGATION  1984   [facility]   UPPER GASTROINTESTINAL ENDOSCOPY  10/2023   OB History  Gravida Para Term Preterm AB Living  3 2 2  1 2   SAB IAB Ectopic Multiple Live Births  2    # Outcome Date GA Lbr Len/2nd Weight Sex Type Anes PTL Lv  3 Term     F Vag-Spont   LIV  2 Term     F Vag-Spont   LIV  1 AB           Patient denies any other pertinent gynecologic issues.  No current facility-administered medications on file prior to encounter.   Current Outpatient Medications on File Prior to Encounter  Medication Sig Dispense Refill   albuterol  (PROVENTIL ) (2.5 MG/3ML) 0.083% nebulizer solution Take 3 mLs (2.5 mg total) by nebulization every 6 (six) hours as needed for wheezing or shortness of breath. 75 mL 5   albuterol  (VENTOLIN  HFA) 108 (90 Base) MCG/ACT inhaler  INHALE 2 PUFFS INTO THE LUNGS EVERY 6 HOURS AS NEEDED FOR WHEEZING OR SHORTNESS OF BREATH 18 g 5   Budeson-Glycopyrrol-Formoterol  (BREZTRI  AEROSPHERE) 160-9-4.8 MCG/ACT AERO USE 2 INHALATIONS BY MOUTH TWICE DAILY 32.1 g 7   busPIRone  (BUSPAR ) 15 MG tablet TAKE ONE TABLET BY MOUTH TWICE DAILY FOR ANXIETY 90 tablet 1   Calcium  Carb-Cholecalciferol  (CALCIUM  600+D3) 600-20 MG-MCG TABS Take 1 tablet by mouth daily.     cariprazine  (VRAYLAR ) 1.5 MG capsule Take 1 capsule (1.5 mg total) by mouth daily. (Patient taking differently: Take 1.5 mg by mouth at bedtime.) 90 capsule 3   carvedilol  (COREG ) 6.25 MG tablet Take 1 tablet (6.25 mg total) by mouth 2 (two) times daily with a meal. 200 tablet 1   Cholecalciferol  (VITAMIN D3) 50 MCG (2000 UT) capsule Take 2,000 Units by mouth daily.     citalopram  (CELEXA ) 40 MG tablet Take 1 tablet (40 mg total) by mouth daily. 90 tablet 3   fexofenadine (ALLEGRA) 180 MG tablet Take 180 mg by mouth daily.     fluticasone  (FLONASE ) 50 MCG/ACT nasal spray SHAKE LIQUID AND USE 2 SPRAYS IN EACH NOSTRIL DAILY 16 g 6   hydrochlorothiazide  (HYDRODIURIL ) 25 MG tablet Take 1 tablet (25 mg total) by mouth daily. 100 tablet 1   losartan  (COZAAR ) 100 MG tablet Take 1 tablet (100 mg total) by mouth daily. 100 tablet 1   Misc Natural Products (COLON CLEANSE PO) Take 1 capsule by mouth at bedtime.     pantoprazole  (PROTONIX ) 20 MG tablet TAKE 1 TABLET BY MOUTH DAILY FOR ACID REFLUX 100 tablet 1   Polyethyl Glyc-Propyl Glyc PF (SYSTANE HYDRATION PF) 0.4-0.3 % SOLN Place 1 drop into both eyes daily as needed (Dry eye).     pregabalin  (LYRICA ) 300 MG capsule Take 1 capsule (300 mg total) by mouth 2 (two) times daily. 180 capsule 1   rosuvastatin  (CRESTOR ) 5 MG tablet Take 1 tablet (5 mg total) by mouth at bedtime. 100 tablet 1   benzonatate  (TESSALON  PERLES) 100 MG capsule Take 1 capsule (100 mg total) by mouth 3 (three) times daily as needed. (Patient not taking: Reported on 02/12/2024) 20  capsule 0   Allergies  Allergen Reactions   Lipitor [Atorvastatin ] Other (See Comments)    Muscle aches and nausea   Naproxen  Nausea And Vomiting    Stomach pain Nsaid    Social History:   reports that she has been smoking cigarettes. She started smoking about 42 years ago. She has a 40 pack-year smoking history. She has been exposed to tobacco smoke. She has never used smokeless tobacco. She reports current drug use. Frequency: 7.00 times per week. Drug: Marijuana. She reports that she does not drink alcohol. Family History  Problem Relation  Age of Onset   Cancer Mother        breast   Anxiety disorder Mother    Arthritis Mother    Depression Mother    Obesity Mother    Coronary artery disease Father        States congenital abnormality   Diabetes Father    Hyperlipidemia Father    Early death Father    CAD Sister        Stents    Cancer Sister        breast   Anxiety disorder Sister    Asthma Sister    Depression Sister    Obesity Sister    Cancer Maternal Grandmother        breast   Heart disease Maternal Grandfather    Cancer Maternal Aunt        breast   Anesthesia problems Neg Hx    Hypotension Neg Hx    Malignant hyperthermia Neg Hx    Pseudochol deficiency Neg Hx    Colon cancer Neg Hx    Liver disease Neg Hx     Review of Systems: Pertinent items noted in HPI and remainder of comprehensive ROS otherwise negative.  PHYSICAL EXAM: Last menstrual period 05/25/2011. CONSTITUTIONAL: Well-developed, well-nourished female in no acute distress.  SKIN: Skin is warm and dry. No rash noted. Not diaphoretic. No erythema. No pallor. NEUROLOGIC: Alert and oriented to person, place, and time. Normal reflexes, muscle tone coordination. No cranial nerve deficit noted. PSYCHIATRIC: Normal mood and affect. Normal behavior. Normal judgment and thought content. CARDIOVASCULAR: Normal heart rate noted, regular rhythm RESPIRATORY: Effort and breath sounds normal, no problems  with respiration noted ABDOMEN: Soft, nontender, nondistended. PELVIC: deferred MUSCULOSKELETAL: no calf tenderness bilaterally EXT: no edema bilaterally, normal pulses  Labs: Results for orders placed or performed during the hospital encounter of 02/15/24 (from the past 2 weeks)  CBC with Differential/Platelet   Collection Time: 02/15/24  1:33 PM  Result Value Ref Range   WBC 11.4 (H) 4.0 - 10.5 K/uL   RBC 4.96 3.87 - 5.11 MIL/uL   Hemoglobin 15.0 12.0 - 15.0 g/dL   HCT 56.8 63.9 - 53.9 %   MCV 86.9 80.0 - 100.0 fL   MCH 30.2 26.0 - 34.0 pg   MCHC 34.8 30.0 - 36.0 g/dL   RDW 86.3 88.4 - 84.4 %   Platelets 239 150 - 400 K/uL   nRBC 0.0 0.0 - 0.2 %   Neutrophils Relative % 63 %   Neutro Abs 7.4 1.7 - 7.7 K/uL   Lymphocytes Relative 27 %   Lymphs Abs 3.0 0.7 - 4.0 K/uL   Monocytes Relative 8 %   Monocytes Absolute 0.9 0.1 - 1.0 K/uL   Eosinophils Relative 1 %   Eosinophils Absolute 0.1 0.0 - 0.5 K/uL   Basophils Relative 0 %   Basophils Absolute 0.1 0.0 - 0.1 K/uL   Immature Granulocytes 1 %   Abs Immature Granulocytes 0.06 0.00 - 0.07 K/uL  Comprehensive metabolic panel   Collection Time: 02/15/24  1:33 PM  Result Value Ref Range   Sodium 133 (L) 135 - 145 mmol/L   Potassium 3.4 (L) 3.5 - 5.1 mmol/L   Chloride 93 (L) 98 - 111 mmol/L   CO2 27 22 - 32 mmol/L   Glucose, Bld 100 (H) 70 - 99 mg/dL   BUN 6 (L) 8 - 23 mg/dL   Creatinine, Ser 9.08 0.44 - 1.00 mg/dL   Calcium  8.9 8.9 - 10.3  mg/dL   Total Protein 7.1 6.5 - 8.1 g/dL   Albumin 3.6 3.5 - 5.0 g/dL   AST 22 15 - 41 U/L   ALT 19 0 - 44 U/L   Alkaline Phosphatase 88 38 - 126 U/L   Total Bilirubin 1.0 0.0 - 1.2 mg/dL   GFR, Estimated >39 >39 mL/min   Anion gap 13 5 - 15  Protime-INR   Collection Time: 02/15/24  1:33 PM  Result Value Ref Range   Prothrombin Time 12.9 11.4 - 15.2 seconds   INR 0.9 0.8 - 1.2    Imaging Studies: Pelvic US  72025: 7 x 4 x 2 cm. The endometrium 4 mm. Small focal echogenic process in  the uterus consistent with subendometrial calcification which is a stable finding. There are no uterine masses.  EMB: scant fragment of atrophic endometrium glandular epithelium  Assessment: High grade dysplasia Postmenopausal bleeding  Plan: LEEP, Hysteroscopy, D&C -NPO -LR @ 125cc/hr -SCDs to OR -Risk/benefits and alternatives reviewed with the patient including but not limited to risk of bleeding, infection and injury to surrounding organs.  Questions and concerns were addressed and pt desires to proceed  Tarick Parenteau, DO Attending Obstetrician & Gynecologist, [facility], [facility]

## 2024-02-18 ENCOUNTER — Encounter (HOSPITAL_COMMUNITY): Payer: Self-pay | Admitting: Anesthesiology

## 2024-02-19 ENCOUNTER — Encounter (HOSPITAL_COMMUNITY): Admission: RE | Payer: Self-pay | Source: Home / Self Care

## 2024-02-19 ENCOUNTER — Encounter (HOSPITAL_COMMUNITY): Payer: Self-pay | Admitting: Anesthesiology

## 2024-02-19 ENCOUNTER — Ambulatory Visit (HOSPITAL_COMMUNITY): Admission: RE | Admit: 2024-02-19 | Source: Home / Self Care | Admitting: Obstetrics & Gynecology

## 2024-02-19 SURGERY — DILATATION AND CURETTAGE /HYSTEROSCOPY
Anesthesia: General

## 2024-02-19 NOTE — OR Nursing (Signed)
 Patient called and stated she needs to cancel her procedure due to having sickness this morning. Dr. Ozan aware.

## 2024-02-20 ENCOUNTER — Encounter: Payer: Self-pay | Admitting: Obstetrics & Gynecology

## 2024-02-20 ENCOUNTER — Encounter: Payer: Self-pay | Admitting: Family Medicine

## 2024-02-20 ENCOUNTER — Ambulatory Visit (INDEPENDENT_AMBULATORY_CARE_PROVIDER_SITE_OTHER)

## 2024-02-20 ENCOUNTER — Ambulatory Visit (INDEPENDENT_AMBULATORY_CARE_PROVIDER_SITE_OTHER): Admitting: Family Medicine

## 2024-02-20 VITALS — BP 133/73 | HR 68 | Temp 97.4°F | Ht 64.0 in | Wt 201.1 lb

## 2024-02-20 DIAGNOSIS — E871 Hypo-osmolality and hyponatremia: Secondary | ICD-10-CM | POA: Diagnosis not present

## 2024-02-20 DIAGNOSIS — F4323 Adjustment disorder with mixed anxiety and depressed mood: Secondary | ICD-10-CM | POA: Diagnosis not present

## 2024-02-20 DIAGNOSIS — F331 Major depressive disorder, recurrent, moderate: Secondary | ICD-10-CM | POA: Diagnosis not present

## 2024-02-20 DIAGNOSIS — G8929 Other chronic pain: Secondary | ICD-10-CM | POA: Diagnosis not present

## 2024-02-20 DIAGNOSIS — M5442 Lumbago with sciatica, left side: Secondary | ICD-10-CM | POA: Diagnosis not present

## 2024-02-20 DIAGNOSIS — M5441 Lumbago with sciatica, right side: Secondary | ICD-10-CM

## 2024-02-20 DIAGNOSIS — Z780 Asymptomatic menopausal state: Secondary | ICD-10-CM | POA: Diagnosis not present

## 2024-02-20 DIAGNOSIS — R6889 Other general symptoms and signs: Secondary | ICD-10-CM | POA: Diagnosis not present

## 2024-02-20 DIAGNOSIS — J441 Chronic obstructive pulmonary disease with (acute) exacerbation: Secondary | ICD-10-CM | POA: Diagnosis not present

## 2024-02-20 LAB — VERITOR FLU A/B WAIVED
Influenza A: NEGATIVE
Influenza B: NEGATIVE

## 2024-02-20 MED ORDER — DOXYCYCLINE HYCLATE 100 MG PO TABS: 100.0000 mg | ORAL_TABLET | Freq: Two times a day (BID) | ORAL | 0 refills | Status: AC

## 2024-02-20 MED ORDER — PREDNISONE 20 MG PO TABS: ORAL_TABLET | ORAL | 0 refills | Status: DC

## 2024-02-20 MED ORDER — PREGABALIN 150 MG PO CAPS: ORAL_CAPSULE | ORAL | 0 refills | Status: DC

## 2024-02-20 NOTE — Progress Notes (Signed)
 Subjective: CC:MDD PCP: Jolinda Norene HERO, DO YEP:Izanmjy K Truszkowski is a 63 y.o. female presenting to clinic today for:  Discussed the use of AI scribe software for clinical note transcription with the patient, who gave verbal consent to proceed.  History of Present Illness   Stephany Poorman is a 63 year old female with COPD who presents with sinus congestion and respiratory symptoms.  She has been experiencing sinus congestion, pressure, pain, and drainage since Saturday, along with a cough. No fever or recent illness exposure. The cough does not produce more than her usual amount of sputum, and there is no blood or brown discoloration in the sputum.  She reports increased wheezing and shortness of breath, though she describes it as 'so, so' compared to her usual baseline. She uses a nebulizer at least twice a day for her COPD (last use last night) and continues to use Breztri  as part of her COPD management.  She is currently taking Vraylar , which makes her feel a little sedated at night especially with Lyrica , which she finds too strong at 300 mg during the day. She takes hydrochlorothiazide  in the morning and reports nocturia 3x/ night.  Her preop blood work showed low sodium, potassium, and chloride levels, with a high blood cell count. No swelling reported.  Due for LEEP procedure soon.         ROS: Per HPI  Allergies  Allergen Reactions   Lipitor [Atorvastatin ] Other (See Comments)    Muscle aches and nausea   Naproxen  Nausea And Vomiting    Stomach pain Nsaid   Past Medical History:  Diagnosis Date   Allergy    Anxiety    Arthritis    Binge eating disorder    Carpal tunnel syndrome on right    Cataract    Both lenses been replaced   Cervical stenosis of spine    C5-6   COPD (chronic obstructive pulmonary disease) (HCC)    Gold III   Depression    Emphysema of lung (HCC)    Encounter for screening fecal occult blood testing 11/29/2022   Fibromuscular  dysplasia of renal artery (HCC) 2006   GERD (gastroesophageal reflux disease)    Headache    migraines 20 years ago   Hepatitis    type A in 1977   HNP (herniated nucleus pulposus), cervical    HPV (human papilloma virus) infection    HTN (hypertension)    Neuropathy    Sleep apnea    Vitamin D  deficiency     Current Outpatient Medications:    albuterol  (PROVENTIL ) (2.5 MG/3ML) 0.083% nebulizer solution, Take 3 mLs (2.5 mg total) by nebulization every 6 (six) hours as needed for wheezing or shortness of breath., Disp: 75 mL, Rfl: 5   albuterol  (VENTOLIN  HFA) 108 (90 Base) MCG/ACT inhaler, INHALE 2 PUFFS INTO THE LUNGS EVERY 6 HOURS AS NEEDED FOR WHEEZING OR SHORTNESS OF BREATH, Disp: 18 g, Rfl: 5   Budeson-Glycopyrrol-Formoterol  (BREZTRI  AEROSPHERE) 160-9-4.8 MCG/ACT AERO, USE 2 INHALATIONS BY MOUTH TWICE DAILY, Disp: 32.1 g, Rfl: 7   busPIRone  (BUSPAR ) 15 MG tablet, TAKE ONE TABLET BY MOUTH TWICE DAILY FOR ANXIETY, Disp: 90 tablet, Rfl: 1   Calcium  Carb-Cholecalciferol  (CALCIUM  600+D3) 600-20 MG-MCG TABS, Take 1 tablet by mouth daily., Disp: , Rfl:    cariprazine  (VRAYLAR ) 1.5 MG capsule, Take 1 capsule (1.5 mg total) by mouth daily. (Patient taking differently: Take 1.5 mg by mouth at bedtime.), Disp: 90 capsule, Rfl: 3   carvedilol  (  COREG ) 6.25 MG tablet, Take 1 tablet (6.25 mg total) by mouth 2 (two) times daily with a meal., Disp: 200 tablet, Rfl: 1   Cholecalciferol  (VITAMIN D3) 50 MCG (2000 UT) capsule, Take 2,000 Units by mouth daily., Disp: , Rfl:    citalopram  (CELEXA ) 40 MG tablet, Take 1 tablet (40 mg total) by mouth daily., Disp: 90 tablet, Rfl: 3   doxycycline  (VIBRA -TABS) 100 MG tablet, Take 1 tablet (100 mg total) by mouth 2 (two) times daily for 7 days., Disp: 14 tablet, Rfl: 0   fexofenadine (ALLEGRA) 180 MG tablet, Take 180 mg by mouth daily., Disp: , Rfl:    fluticasone  (FLONASE ) 50 MCG/ACT nasal spray, SHAKE LIQUID AND USE 2 SPRAYS IN EACH NOSTRIL DAILY, Disp: 16 g,  Rfl: 6   hydrochlorothiazide  (HYDRODIURIL ) 25 MG tablet, Take 1 tablet (25 mg total) by mouth daily., Disp: 100 tablet, Rfl: 1   losartan  (COZAAR ) 100 MG tablet, Take 1 tablet (100 mg total) by mouth daily., Disp: 100 tablet, Rfl: 1   Misc Natural Products (COLON CLEANSE PO), Take 1 capsule by mouth at bedtime., Disp: , Rfl:    pantoprazole  (PROTONIX ) 20 MG tablet, TAKE 1 TABLET BY MOUTH DAILY FOR ACID REFLUX, Disp: 100 tablet, Rfl: 1   Polyethyl Glyc-Propyl Glyc PF (SYSTANE HYDRATION PF) 0.4-0.3 % SOLN, Place 1 drop into both eyes daily as needed (Dry eye)., Disp: , Rfl:    predniSONE  (DELTASONE ) 20 MG tablet, 2 po at same time daily for 5 days, Disp: 10 tablet, Rfl: 0   rosuvastatin  (CRESTOR ) 5 MG tablet, Take 1 tablet (5 mg total) by mouth at bedtime., Disp: 100 tablet, Rfl: 1   benzonatate  (TESSALON  PERLES) 100 MG capsule, Take 1 capsule (100 mg total) by mouth 3 (three) times daily as needed. (Patient not taking: Reported on 02/20/2024), Disp: 20 capsule, Rfl: 0   pregabalin  (LYRICA ) 150 MG capsule, Take 1 capsule (150 mg total) by mouth in the morning AND 2 capsules (300 mg total) every evening., Disp: 270 capsule, Rfl: 0 Social History   Socioeconomic History   Marital status: Divorced    Spouse name: Not on file   Number of children: Not on file   Years of education: Not on file   Highest education level: GED or equivalent  Occupational History   Not on file  Tobacco Use   Smoking status: Every Day    Current packs/day: 0.00    Average packs/day: 1 pack/day for 40.0 years (40.0 ttl pk-yrs)    Types: Cigarettes    Start date: 11/01/1981    Last attempt to quit: 11/01/2021    Years since quitting: 2.3    Passive exposure: Current   Smokeless tobacco: Never   Tobacco comments:    Quit 11/15/21  Vaping Use   Vaping status: Former   Devices: hit a vape 3x a couple weeks ago with friends.  Substance and Sexual Activity   Alcohol use: No   Drug use: Yes    Frequency: 7.0 times per  week    Types: Marijuana   Sexual activity: Not Currently    Birth control/protection: Abstinence, Post-menopausal    Comment: tubal  Other Topics Concern   Not on file  Social History Narrative   The patient does not have any sex drive and has not had one for almost a year.   Her   husband thinks she does not love him anymore.  He does not seem to understand   her   depression.  Social Drivers of Health   Financial Resource Strain: Medium Risk (02/16/2024)   Overall Financial Resource Strain (CARDIA)    Difficulty of Paying Living Expenses: Somewhat hard  Food Insecurity: Food Insecurity Present (02/16/2024)   Hunger Vital Sign    Worried About Running Out of Food in the Last Year: Sometimes true    Ran Out of Food in the Last Year: Often true  Transportation Needs: No Transportation Needs (02/16/2024)   PRAPARE - Administrator, Civil Service (Medical): No    Lack of Transportation (Non-Medical): No  Physical Activity: Insufficiently Active (02/16/2024)   Exercise Vital Sign    Days of Exercise per Week: 1 day    Minutes of Exercise per Session: 120 min  Stress: Stress Concern Present (02/16/2024)   [employer] of Occupational Health - Occupational Stress Questionnaire    Feeling of Stress: To some extent  Social Connections: Socially Isolated (02/16/2024)   Social Connection and Isolation Panel    Frequency of Communication with Friends and Family: More than three times a week    Frequency of Social Gatherings with Friends and Family: Once a week    Attends Religious Services: Never    Database administrator or Organizations: No    Attends Engineer, structural: Not on file    Marital Status: Divorced  Intimate Partner Violence: Not At Risk (12/03/2023)   Humiliation, Afraid, Rape, and Kick questionnaire    Fear of Current or Ex-Partner: No    Emotionally Abused: No    Physically Abused: No    Sexually Abused: No   Family History  Problem  Relation Age of Onset   Cancer Mother        breast   Anxiety disorder Mother    Arthritis Mother    Depression Mother    Obesity Mother    Coronary artery disease Father        States congenital abnormality   Diabetes Father    Hyperlipidemia Father    Early death Father    CAD Sister        Stents    Cancer Sister        breast   Anxiety disorder Sister    Asthma Sister    Depression Sister    Obesity Sister    Cancer Maternal Grandmother        breast   Heart disease Maternal Grandfather    Cancer Maternal Aunt        breast   Anesthesia problems Neg Hx    Hypotension Neg Hx    Malignant hyperthermia Neg Hx    Pseudochol deficiency Neg Hx    Colon cancer Neg Hx    Liver disease Neg Hx     Objective: Office vital signs reviewed. BP 133/73   Pulse 68   Temp (!) 97.4 F (36.3 C)   Ht 5' 4 (1.626 m)   Wt 201 lb 2 oz (91.2 kg)   LMP 05/25/2011   SpO2 95%   BMI 34.52 kg/m   Physical Examination:  General: Awake, alert, morbidly obese, No acute distress HEENT: Normal    Neck: No masses palpated. No lymphadenopathy    Ears: Tympanic membranes intact, normal light reflex, no erythema, no bulging    Eyes: PERRLA, extraocular membranes intact, sclera white    Nose: nasal turbinates moist, clear nasal discharge    Throat: moist mucus membranes, no erythema, no tonsillar exudate.  Airway is patent Cardio: regular rate and rhythm,  S1S2 heard, no murmurs appreciated Pulm: Globally decreased breath sounds but no appreciable wheezes, rhonchi or rales and she has normal work of breathing on room air MSK: Ambulating independently with normal gait and station.  No gross joint deformity appreciated.     02/20/2024   11:43 AM 12/03/2023    9:38 AM 11/20/2023    1:03 PM  Depression screen PHQ 2/9  Decreased Interest 1 1 3   Down, Depressed, Hopeless 1 2 3   PHQ - 2 Score 2 3 6   Altered sleeping 1 2 3   Tired, decreased energy 1 3 3   Change in appetite 1 3 3   Feeling bad  or failure about yourself  0 0 1  Trouble concentrating 1 1 2   Moving slowly or fidgety/restless 0 0 0  Suicidal thoughts 0 0 0  PHQ-9 Score 6 12 18   Difficult doing work/chores Somewhat difficult  Somewhat difficult      02/20/2024   11:43 AM 12/03/2023    9:38 AM 11/20/2023    1:02 PM 11/20/2023   12:55 PM  GAD 7 : Generalized Anxiety Score  Nervous, Anxious, on Edge 1 2 2  0  Control/stop worrying 0 0 2 0  Worry too much - different things 0 0 0 0  Trouble relaxing 1 1 1  0  Restless 1 1 1  0  Easily annoyed or irritable 0 1 1 0  Afraid - awful might happen 0 0 0 0  Total GAD 7 Score 3 5 7  0  Anxiety Difficulty Somewhat difficult  Somewhat difficult     Assessment/ Plan: 63 y.o. female   Moderate episode of recurrent major depressive disorder (HCC)  Situational mixed anxiety and depressive disorder  COPD exacerbation (HCC) - Plan: doxycycline  (VIBRA -TABS) 100 MG tablet, predniSONE  (DELTASONE ) 20 MG tablet  Flu-like symptoms - Plan: Veritor Flu A/B Waived, Novel Coronavirus, NAA ([facility])  Hyponatremia - Plan: Basic Metabolic Panel  Chronic bilateral low back pain with bilateral sciatica - Plan: pregabalin  (LYRICA ) 150 MG capsule  Assessment and Plan    COPD with acute exacerbation Acute exacerbation with increased wheezing and shortness of breath. No fever or recent viral exposure. Productive cough without increased frequency. Lungs sound tight. - Prescribed antibiotics and prednisone  to prevent pneumonia. - Advised to contact if breathing worsens for potential chest x-ray.  Hypertension w/ Electrolyte abnormalities suspected to be secondary to hydrochlorothiazide  Low sodium, potassium, and chloride levels likely due to hydrochlorothiazide . High blood cell count. No significant swelling. - Recheck BMP to assess electrolyte levels. - Consider reducing hydrochlorothiazide  dosage or switching medication if abnormalities persist. - Monitor blood pressure.   Depression and  adjustment disorder with mixed anxiety and depressed mood Managed with Vraylar , effective in boosting antidepressant effects. Reports nighttime wooziness likely due to Vraylar  and Lyrica . - Adjust Lyrica  dosage to 150 mg in the morning and 300 mg at bedtime. - Continue Vraylar  as prescribed.   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 UDS/ CSC UTd - f/u in 3 months for recheck/ renewal      Rolinda Impson CHRISTELLA Fielding, DO [facility] [phone]

## 2024-02-21 ENCOUNTER — Telehealth: Payer: Self-pay | Admitting: *Deleted

## 2024-02-21 LAB — NOVEL CORONAVIRUS, NAA: SARS-CoV-2, NAA: NOT DETECTED

## 2024-02-21 LAB — BASIC METABOLIC PANEL WITH GFR
BUN/Creatinine Ratio: 7 — ABNORMAL LOW (ref 12–28)
BUN: 6 mg/dL — ABNORMAL LOW (ref 8–27)
CO2: 29 mmol/L (ref 20–29)
Calcium: 9 mg/dL (ref 8.7–10.3)
Chloride: 89 mmol/L — ABNORMAL LOW (ref 96–106)
Creatinine, Ser: 0.84 mg/dL (ref 0.57–1.00)
Glucose: 103 mg/dL — ABNORMAL HIGH (ref 70–99)
Potassium: 3.8 mmol/L (ref 3.5–5.2)
Sodium: 132 mmol/L — ABNORMAL LOW (ref 134–144)
eGFR: 78 mL/min/1.73 (ref >59)

## 2024-02-21 NOTE — Telephone Encounter (Signed)
 Received fax from [facility] (CMN) regarding the need for OV notes.  Notes faxed back to [facility].  Received confirmation fax that it was sent successfully.  CMN sent to scan.  Nothing further needed.

## 2024-02-22 ENCOUNTER — Ambulatory Visit: Payer: Self-pay | Admitting: Family Medicine

## 2024-02-22 DIAGNOSIS — I10 Essential (primary) hypertension: Secondary | ICD-10-CM

## 2024-02-22 DIAGNOSIS — Z780 Asymptomatic menopausal state: Secondary | ICD-10-CM | POA: Diagnosis not present

## 2024-02-22 DIAGNOSIS — M8589 Other specified disorders of bone density and structure, multiple sites: Secondary | ICD-10-CM | POA: Diagnosis not present

## 2024-02-22 DIAGNOSIS — E871 Hypo-osmolality and hyponatremia: Secondary | ICD-10-CM

## 2024-02-22 MED ORDER — AMLODIPINE BESYLATE 5 MG PO TABS: 5.0000 mg | ORAL_TABLET | Freq: Every day | ORAL | 3 refills | Status: DC

## 2024-02-25 ENCOUNTER — Ambulatory Visit: Payer: Self-pay | Admitting: Family Medicine

## 2024-02-26 ENCOUNTER — Telehealth: Payer: Self-pay

## 2024-02-26 NOTE — Telephone Encounter (Signed)
 Copied from CRM #[id number]. Topic: Clinical - Lab/Test Results >> Feb 26, 2024  3:00 PM Montie POUR wrote: Reason for CRM:  I read Jamie Mcgee this from her chart: Jamie Mcgee 02/22/2024  4:42 PM EDT  Looks like patient may have already seen results via mychart but I did call patient and left message for her to call back to go over results and/or if she had questions. Also needs lab appt to repeat BMP. Elizabth said she would go to [facility]. No other questions or concerns. Thanks

## 2024-02-26 NOTE — Telephone Encounter (Signed)
 Called and spoke with patient and she confirmed that she is going to have her labs done at [facility] in [place] and just wanted to make sure [facility] can use the order.

## 2024-02-27 ENCOUNTER — Encounter: Payer: Self-pay | Admitting: Obstetrics & Gynecology

## 2024-02-27 ENCOUNTER — Encounter: Admitting: Obstetrics & Gynecology

## 2024-03-01 ENCOUNTER — Other Ambulatory Visit: Payer: Self-pay

## 2024-03-01 ENCOUNTER — Emergency Department (HOSPITAL_COMMUNITY)
Admission: EM | Admit: 2024-03-01 | Discharge: 2024-03-01 | Disposition: A | Discharge status: Home / Self Care | Attending: Emergency Medicine | Admitting: Emergency Medicine

## 2024-03-01 ENCOUNTER — Encounter (HOSPITAL_COMMUNITY): Payer: Self-pay

## 2024-03-01 ENCOUNTER — Emergency Department (HOSPITAL_COMMUNITY)

## 2024-03-01 DIAGNOSIS — I10 Essential (primary) hypertension: Secondary | ICD-10-CM | POA: Insufficient documentation

## 2024-03-01 DIAGNOSIS — Z79899 Other long term (current) drug therapy: Secondary | ICD-10-CM | POA: Diagnosis not present

## 2024-03-01 DIAGNOSIS — R519 Headache, unspecified: Secondary | ICD-10-CM | POA: Diagnosis not present

## 2024-03-01 DIAGNOSIS — J441 Chronic obstructive pulmonary disease with (acute) exacerbation: Secondary | ICD-10-CM | POA: Insufficient documentation

## 2024-03-01 DIAGNOSIS — R030 Elevated blood-pressure reading, without diagnosis of hypertension: Secondary | ICD-10-CM | POA: Diagnosis not present

## 2024-03-01 LAB — BASIC METABOLIC PANEL WITH GFR
Anion gap: 12 (ref 5–15)
BUN: <5 mg/dL — ABNORMAL LOW (ref 8–23)
CO2: 26 mmol/L (ref 22–32)
Calcium: 9 mg/dL (ref 8.9–10.3)
Chloride: 100 mmol/L (ref 98–111)
Creatinine, Ser: 0.75 mg/dL (ref 0.44–1.00)
GFR, Estimated: >60 mL/min (ref >60)
Glucose, Bld: 106 mg/dL — ABNORMAL HIGH (ref 70–99)
Potassium: 4.6 mmol/L (ref 3.5–5.1)
Sodium: 138 mmol/L (ref 135–145)

## 2024-03-01 LAB — CBC WITH DIFFERENTIAL/PLATELET
Abs Immature Granulocytes: 0.07 K/uL (ref 0.00–0.07)
Basophils Absolute: 0 K/uL (ref 0.0–0.1)
Basophils Relative: 0 %
Eosinophils Absolute: 0.1 K/uL (ref 0.0–0.5)
Eosinophils Relative: 1 %
HCT: 43.3 % (ref 36.0–46.0)
Hemoglobin: 14.4 g/dL (ref 12.0–15.0)
Immature Granulocytes: 1 %
Lymphocytes Relative: 28 %
Lymphs Abs: 3 K/uL (ref 0.7–4.0)
MCH: 30.3 pg (ref 26.0–34.0)
MCHC: 33.3 g/dL (ref 30.0–36.0)
MCV: 91 fL (ref 80.0–100.0)
Monocytes Absolute: 0.7 K/uL (ref 0.1–1.0)
Monocytes Relative: 6 %
Neutro Abs: 7 K/uL (ref 1.7–7.7)
Neutrophils Relative %: 64 %
Platelets: 185 K/uL (ref 150–400)
RBC: 4.76 MIL/uL (ref 3.87–5.11)
RDW: 14 % (ref 11.5–15.5)
WBC: 10.9 K/uL — ABNORMAL HIGH (ref 4.0–10.5)
nRBC: 0 % (ref 0.0–0.2)

## 2024-03-01 LAB — BRAIN NATRIURETIC PEPTIDE: B Natriuretic Peptide: 226 pg/mL — ABNORMAL HIGH (ref 0.0–100.0)

## 2024-03-01 LAB — TROPONIN I (HIGH SENSITIVITY)
Troponin I (High Sensitivity): 4 ng/L (ref <18)
Troponin I (High Sensitivity): 5 ng/L (ref <18)

## 2024-03-01 MED ORDER — IPRATROPIUM-ALBUTEROL 0.5-2.5 (3) MG/3ML IN SOLN
3.0000 mL | Freq: Once | RESPIRATORY_TRACT | Status: AC
  Administered 2024-03-01: 3 mL via RESPIRATORY_TRACT
  Filled 2024-03-01: qty 3

## 2024-03-01 NOTE — Discharge Instructions (Signed)
 As discussed, take all of your blood pressure medications daily as directed.  Keep a blood pressure log and record your blood pressure readings daily and take this with you when you follow-up with your primary care provider.  Have also placed a referral to cardiology for you.  Continue using your albuterol  nebulizer, 1 treatment every 4-6 hours as needed.  Someone from the office will likely contact you to arrange follow-up appointment.  Please return to the emergency department for any new or worsening symptoms.

## 2024-03-01 NOTE — ED Notes (Signed)
 Pt/family received d/c paperwork at this time. After going over the paperwork any questions, comments, or concerns were answered to the best of this nurse's knowledge. The pt/family verbally acknowledged the teachings/instructions.

## 2024-03-01 NOTE — ED Provider Notes (Signed)
 [place] EMERGENCY DEPARTMENT AT [facility] Provider Note   CSN: [id number] Arrival date & time: 03/01/24  1258     Patient presents with: Hypertension   Jamie Mcgee is a 63 y.o. female.  {Add pertinent medical, surgical, social history, OB history to HPI:32947}  Hypertension       Prior to Admission medications   Medication Sig Start Date End Date Taking? Authorizing Provider  albuterol  (PROVENTIL ) (2.5 MG/3ML) 0.083% nebulizer solution Take 3 mLs (2.5 mg total) by nebulization every 6 (six) hours as needed for wheezing or shortness of breath. 01/10/24 01/09/25  Parrett, Madelin RAMAN, NP  albuterol  (VENTOLIN  HFA) 108 (90 Base) MCG/ACT inhaler INHALE 2 PUFFS INTO THE LUNGS EVERY 6 HOURS AS NEEDED FOR WHEEZING OR SHORTNESS OF BREATH 08/30/23   Jude Harden GAILS, MD  amLODipine  (NORVASC ) 5 MG tablet Take 1 tablet (5 mg total) by mouth daily. To replace HCTZ 02/22/24   Jolinda Potter M, DO  benzonatate  (TESSALON  PERLES) 100 MG capsule Take 1 capsule (100 mg total) by mouth 3 (three) times daily as needed. Patient not taking: Reported on 02/20/2024 12/23/23   Lavell Lye A, FNP  Budeson-Glycopyrrol-Formoterol  (BREZTRI  AEROSPHERE) 160-9-4.8 MCG/ACT AERO USE 2 INHALATIONS BY MOUTH TWICE DAILY 06/19/23   Jude Harden GAILS, MD  busPIRone  (BUSPAR ) 15 MG tablet TAKE ONE TABLET BY MOUTH TWICE DAILY FOR ANXIETY 11/20/23   Jolinda Potter M, DO  Calcium  Carb-Cholecalciferol  (CALCIUM  600+D3) 600-20 MG-MCG TABS Take 1 tablet by mouth daily.    [provider]  cariprazine  (VRAYLAR ) 1.5 MG capsule Take 1 capsule (1.5 mg total) by mouth daily. Patient taking differently: Take 1.5 mg by mouth at bedtime. 11/20/23   Jolinda Potter HERO, DO  carvedilol  (COREG ) 6.25 MG tablet Take 1 tablet (6.25 mg total) by mouth 2 (two) times daily with a meal. 11/20/23   Jolinda Potter M, DO  Cholecalciferol  (VITAMIN D3) 50 MCG (2000 UT) capsule Take 2,000 Units by mouth daily.    [provider]  citalopram  (CELEXA ) 40 MG tablet Take 1 tablet (40 mg total) by mouth daily. 08/20/23   Jolinda Potter HERO, DO  fexofenadine (ALLEGRA) 180 MG tablet Take 180 mg by mouth daily.    [provider]  fluticasone  (FLONASE ) 50 MCG/ACT nasal spray SHAKE LIQUID AND USE 2 SPRAYS IN EACH NOSTRIL DAILY 01/03/24   Jolinda Potter M, DO  losartan  (COZAAR ) 100 MG tablet Take 1 tablet (100 mg total) by mouth daily. 11/20/23   Jolinda Potter HERO, DO  Misc Natural Products (COLON CLEANSE PO) Take 1 capsule by mouth at bedtime.    [provider]  pantoprazole  (PROTONIX ) 20 MG tablet TAKE 1 TABLET BY MOUTH DAILY FOR ACID REFLUX 11/20/23   Jolinda Potter M, DO  Polyethyl Glyc-Propyl Glyc PF (SYSTANE HYDRATION PF) 0.4-0.3 % SOLN Place 1 drop into both eyes daily as needed (Dry eye).    [provider]  predniSONE  (DELTASONE ) 20 MG tablet 2 po at same time daily for 5 days 02/20/24   Jolinda Potter HERO, DO  pregabalin  (LYRICA ) 150 MG capsule Take 1 capsule (150 mg total) by mouth in the morning AND 2 capsules (300 mg total) every evening. 02/20/24   Jolinda Potter HERO, DO  rosuvastatin  (CRESTOR ) 5 MG tablet Take 1 tablet (5 mg total) by mouth at bedtime. 11/20/23   Jolinda Potter HERO, DO    Allergies: Lipitor [atorvastatin ] and Naproxen     Review of Systems  Updated Vital Signs BP (!) 144/79  Pulse 73   Temp (!) 97.5 F (36.4 C) (Oral)   Resp 19   Ht 5' 4 (1.626 m)   Wt 91 kg   LMP 05/25/2011   SpO2 96%   BMI 34.44 kg/m   Physical Exam  (all labs ordered are listed, but only abnormal results are displayed) Labs Reviewed  BASIC METABOLIC PANEL WITH GFR - Abnormal; Notable for the following components:      Result Value   Glucose, Bld 106 (*)    BUN <5 (*)    All other components within normal limits  BRAIN NATRIURETIC PEPTIDE - Abnormal; Notable for the following components:   B Natriuretic Peptide 226.0 (*)    All other components within normal limits  CBC  WITH DIFFERENTIAL/PLATELET - Abnormal; Notable for the following components:   WBC 10.9 (*)    All other components within normal limits  CBC WITH DIFFERENTIAL/PLATELET  TROPONIN I (HIGH SENSITIVITY)  TROPONIN I (HIGH SENSITIVITY)    EKG: EKG Interpretation Date/Time:  Saturday March 01 2024 15:52:26 EDT Ventricular Rate:  68 PR Interval:  132 QRS Duration:  86 QT Interval:  407 QTC Calculation: 433 R Axis:   97  Text Interpretation: Sinus rhythm Right axis deviation Borderline T abnormalities, lateral leads Confirmed by Francesca Fallow ([id number]) on 03/01/2024 4:16:44 PM  Radiology: [facility] Chest 2 View Result Date: 03/01/2024 CLINICAL DATA:  Headache and elevated blood pressure. EXAM: CHEST - 2 VIEW COMPARISON:  January 10, 2024 FINDINGS: The heart size and mediastinal contours are within normal limits. The lungs are hyperinflated with stable, diffuse, chronic appearing increased interstitial lung markings. No focal consolidation, pleural effusion or pneumothorax is identified. Radiopaque surgical clips are seen within the right upper quadrant. Postoperative changes are noted within the lower cervical spine. Degenerative changes are present throughout the thoracic spine. IMPRESSION: Stable, chronic interstitial lung disease without evidence of acute or active cardiopulmonary disease. Electronically Signed   By: Suzen Dials M.D.   On: 03/01/2024 15:40    {Document cardiac monitor, telemetry assessment procedure when appropriate:32947} Procedures   Medications Ordered in the ED  ipratropium-albuterol  (DUONEB) 0.5-2.5 (3) MG/3ML nebulizer solution 3 mL (3 mLs Nebulization Given 03/01/24 1519)      {Click here for ABCD2, HEART and other calculators REFRESH Note before signing:1}                              Medical Decision Making Amount and/or Complexity of Data Reviewed Labs: ordered. Radiology: ordered. Discussion of management or test interpretation with external provider(s):  On recheck, patient resting comfortably, reports feeling much better.  Her blood pressure has improved over time.  Now 144/79.  No evidence of endorgan damage.  Patient was not taking her new antihypertensive medication, I have encouraged her to take all of her medications as directed.  She will keep a blood pressure log.  I have recommended close outpatient follow-up with her PCP as well.  Her BNP today was mildly elevated, but she does not appear to be volume overloaded and her chest x-ray was reassuring.  She has not seen cardiology before or had a previous echo.  May be beneficial to give outpatient referral to cardiology for echocardiogram.  Risk Prescription drug management.     {Document critical care time when appropriate  Document review of labs and clinical decision tools ie CHADS2VASC2, etc  Document your independent review of radiology images and any outside records  Document your  discussion with family members, caretakers and with consultants  Document social determinants of health affecting pt's care  Document your decision making why or why not admission, treatments were needed:32947:::1}   Final diagnoses:  None    ED Discharge Orders     None

## 2024-03-01 NOTE — ED Triage Notes (Signed)
 Pt stated that her blood pressure this morning was 187/107. Pt has taken her medications with no relief. Also complaining of headache. BP in triage was 191/96

## 2024-03-04 ENCOUNTER — Ambulatory Visit: Attending: Cardiology | Admitting: Cardiology

## 2024-03-04 ENCOUNTER — Encounter: Payer: Self-pay | Admitting: Cardiology

## 2024-03-04 ENCOUNTER — Encounter (INDEPENDENT_AMBULATORY_CARE_PROVIDER_SITE_OTHER): Payer: Self-pay | Admitting: Family Medicine

## 2024-03-04 VITALS — BP 148/92 | HR 84 | Ht 64.0 in | Wt 202.4 lb

## 2024-03-04 DIAGNOSIS — I10 Essential (primary) hypertension: Secondary | ICD-10-CM | POA: Diagnosis not present

## 2024-03-04 DIAGNOSIS — R0602 Shortness of breath: Secondary | ICD-10-CM

## 2024-03-04 DIAGNOSIS — E871 Hypo-osmolality and hyponatremia: Secondary | ICD-10-CM

## 2024-03-04 DIAGNOSIS — J441 Chronic obstructive pulmonary disease with (acute) exacerbation: Secondary | ICD-10-CM | POA: Diagnosis not present

## 2024-03-04 DIAGNOSIS — E782 Mixed hyperlipidemia: Secondary | ICD-10-CM

## 2024-03-04 MED ORDER — PREDNISONE 10 MG (21) PO TBPK: ORAL_TABLET | ORAL | 0 refills | Status: DC

## 2024-03-04 MED ORDER — AMLODIPINE BESYLATE 10 MG PO TABS: 10.0000 mg | ORAL_TABLET | Freq: Every day | ORAL | 6 refills | Status: DC

## 2024-03-04 NOTE — Telephone Encounter (Signed)
 Triage team, can we call patient and see if she can come in to office in next couple of weeks as she continues to have frequent exacerbations. Can put on my schedule I should have some openings if Dr. Jude  is unavailable.

## 2024-03-04 NOTE — Patient Instructions (Signed)
 Medication Instructions:   Increase Norvasc  to 10mg  daily  Continue all other medications.     Labwork:  none  Testing/Procedures:  none  Follow-Up:  6 months   Any Other Special Instructions Will Be Listed Below (If Applicable).  Nurse visit in 2 weeks for BP check   If you need a refill on your cardiac medications before your next appointment, please call your pharmacy.

## 2024-03-04 NOTE — Telephone Encounter (Signed)

## 2024-03-04 NOTE — Progress Notes (Signed)
 Clinical Summary Ms. Mikulski is a 63 y.o.female seen today for follow up of the following medical problems.   1.Chest pain/DOE - 03/2015 echo: LVEF 60-65%, no WMAs - 03/2015 nuclear stress: no ischemia - 06/2017 nuclear stress: no ischemia   ER visit Jan 2023 with chest pain/SOB  CT PE negative, trops neg x2, EKG SR nonspecific ST/T changes   - chronic SOB, wheezing. Recent COPD exacerbation.  - no chest pains.      2.COPD - followed by Dr Jude.  - using inhalers   3. Hyperlipidemia - 04/2021 TC 124 TG 70 HDL 58 LDL 52 - 11/2023 TC 880 TG 62 HDL 65 LDL 41     4. HTN - recent high bp's at home, presented to ER 9/27 and reported home bp 187/107 - reports hydrochlorothiazide  recently stopped due to low Na, K, Cl. Started on norvasc  - recent course of steroids - bp in ER 144/79  - home bp's last few days have been 140s/70s   Past Medical History:  Diagnosis Date   Allergy    Anxiety    Arthritis    Binge eating disorder    Carpal tunnel syndrome on right    Cataract    Both lenses been replaced   Cervical stenosis of spine    C5-6   COPD (chronic obstructive pulmonary disease) (HCC)    Gold III   Depression    Emphysema of lung (HCC)    Encounter for screening fecal occult blood testing 11/29/2022   Fibromuscular dysplasia of renal artery 2006   GERD (gastroesophageal reflux disease)    Headache    migraines 20 years ago   Hepatitis    type A in 1977   HNP (herniated nucleus pulposus), cervical    HPV (human papilloma virus) infection    HTN (hypertension)    Neuropathy    Sleep apnea    Vitamin D  deficiency      Allergies  Allergen Reactions   Lipitor [Atorvastatin ] Other (See Comments)    Muscle aches and nausea   Naproxen  Nausea And Vomiting    Stomach pain Nsaid     Current Outpatient Medications  Medication Sig Dispense Refill   albuterol  (PROVENTIL ) (2.5 MG/3ML) 0.083% nebulizer solution Take 3 mLs (2.5 mg total) by nebulization every  6 (six) hours as needed for wheezing or shortness of breath. 75 mL 5   albuterol  (VENTOLIN  HFA) 108 (90 Base) MCG/ACT inhaler INHALE 2 PUFFS INTO THE LUNGS EVERY 6 HOURS AS NEEDED FOR WHEEZING OR SHORTNESS OF BREATH 18 g 5   amLODipine  (NORVASC ) 5 MG tablet Take 1 tablet (5 mg total) by mouth daily. To replace HCTZ 90 tablet 3   benzonatate  (TESSALON  PERLES) 100 MG capsule Take 1 capsule (100 mg total) by mouth 3 (three) times daily as needed. (Patient not taking: Reported on 02/20/2024) 20 capsule 0   Budeson-Glycopyrrol-Formoterol  (BREZTRI  AEROSPHERE) 160-9-4.8 MCG/ACT AERO USE 2 INHALATIONS BY MOUTH TWICE DAILY 32.1 g 7   busPIRone  (BUSPAR ) 15 MG tablet TAKE ONE TABLET BY MOUTH TWICE DAILY FOR ANXIETY 90 tablet 1   Calcium  Carb-Cholecalciferol  (CALCIUM  600+D3) 600-20 MG-MCG TABS Take 1 tablet by mouth daily.     cariprazine  (VRAYLAR ) 1.5 MG capsule Take 1 capsule (1.5 mg total) by mouth daily. (Patient taking differently: Take 1.5 mg by mouth at bedtime.) 90 capsule 3   carvedilol  (COREG ) 6.25 MG tablet Take 1 tablet (6.25 mg total) by mouth 2 (two) times daily with a meal.  200 tablet 1   Cholecalciferol  (VITAMIN D3) 50 MCG (2000 UT) capsule Take 2,000 Units by mouth daily.     citalopram  (CELEXA ) 40 MG tablet Take 1 tablet (40 mg total) by mouth daily. 90 tablet 3   fexofenadine (ALLEGRA) 180 MG tablet Take 180 mg by mouth daily.     fluticasone  (FLONASE ) 50 MCG/ACT nasal spray SHAKE LIQUID AND USE 2 SPRAYS IN EACH NOSTRIL DAILY 16 g 6   losartan  (COZAAR ) 100 MG tablet Take 1 tablet (100 mg total) by mouth daily. 100 tablet 1   Misc Natural Products (COLON CLEANSE PO) Take 1 capsule by mouth at bedtime.     pantoprazole  (PROTONIX ) 20 MG tablet TAKE 1 TABLET BY MOUTH DAILY FOR ACID REFLUX 100 tablet 1   Polyethyl Glyc-Propyl Glyc PF (SYSTANE HYDRATION PF) 0.4-0.3 % SOLN Place 1 drop into both eyes daily as needed (Dry eye).     predniSONE  (DELTASONE ) 20 MG tablet 2 po at same time daily for 5 days  10 tablet 0   pregabalin  (LYRICA ) 150 MG capsule Take 1 capsule (150 mg total) by mouth in the morning AND 2 capsules (300 mg total) every evening. 270 capsule 0   rosuvastatin  (CRESTOR ) 5 MG tablet Take 1 tablet (5 mg total) by mouth at bedtime. 100 tablet 1   No current facility-administered medications for this visit.     Past Surgical History:  Procedure Laterality Date   ANTERIOR CERVICAL DECOMP/DISCECTOMY FUSION N/A 10/05/2017   Procedure: C5-6 ANTERIOR CERVICAL DECOMPRESSION/DISCECTOMY FUSION,  ALLOGRAFT, PLATE  (NECK FIRST AND CARPAL TUNNEL RELEASE SECOND);  Surgeon: Barbarann Oneil BROCKS, MD;  Location: [facility] OR;  Service: Orthopedics;  Laterality: N/A;   APPENDECTOMY  1993   BREAST BIOPSY Right    CARPAL TUNNEL RELEASE Right 10/05/2017   Procedure: RIGHT CARPAL TUNNEL RELEASE;  Surgeon: Barbarann Oneil BROCKS, MD;  Location: [facility] OR;  Service: Orthopedics;  Laterality: Right;   CATARACT EXTRACTION  08/2010   left    CATARACT EXTRACTION W/PHACO  06/01/2011   Procedure: CATARACT EXTRACTION PHACO AND INTRAOCULAR LENS PLACEMENT (IOC);  Surgeon: Cherene Mania;  Location: [facility] ORS;  Service: Ophthalmology;  Laterality: Right;  CDE:9.62   CHOLECYSTECTOMY     COLONOSCOPY WITH PROPOFOL  N/A 07/06/2020   Procedure: COLONOSCOPY WITH PROPOFOL ;  Surgeon: Cindie Carlin POUR, DO;  Location[facility] ENDOSCOPY;  Service: Endoscopy;  Laterality: N/A;  11:00am   ESOPHAGEAL DILATION N/A 11/05/2023   Procedure: DILATION, ESOPHAGUS;  Surgeon: Cindie Carlin POUR, DO;  Location: [facility] ENDO SUITE;  Service: Endoscopy;  Laterality: N/A;   ESOPHAGOGASTRODUODENOSCOPY N/A 11/05/2023   Procedure: EGD (ESOPHAGOGASTRODUODENOSCOPY);  Surgeon: Cindie Carlin POUR, DO;  Location: [facility] ENDO SUITE;  Service: Endoscopy;  Laterality: N/A;  8:00 am, asa 3   EYE SURGERY     Cataract removal   RENAL ARTERY ANGIOPLASTY  2006   right   TUBAL LIGATION  1984   [facility]   UPPER GASTROINTESTINAL ENDOSCOPY  10/2023     Allergies  Allergen Reactions   Lipitor  [Atorvastatin ] Other (See Comments)    Muscle aches and nausea   Naproxen  Nausea And Vomiting    Stomach pain Nsaid      Family History  Problem Relation Age of Onset   Cancer Mother        breast   Anxiety disorder Mother    Arthritis Mother    Depression Mother    Obesity Mother    Coronary artery disease Father        States  congenital abnormality   Diabetes Father    Hyperlipidemia Father    Early death Father    CAD Sister        Stents    Cancer Sister        breast   Anxiety disorder Sister    Asthma Sister    Depression Sister    Obesity Sister    Cancer Maternal Grandmother        breast   Heart disease Maternal Grandfather    Cancer Maternal Aunt        breast   Anesthesia problems Neg Hx    Hypotension Neg Hx    Malignant hyperthermia Neg Hx    Pseudochol deficiency Neg Hx    Colon cancer Neg Hx    Liver disease Neg Hx      Social History Ms. Bells reports that she has been smoking cigarettes. She started smoking about 42 years ago. She has a 40 pack-year smoking history. She has been exposed to tobacco smoke. She has never used smokeless tobacco. Ms. Oconnor reports no history of alcohol use.     Physical Examination Today's Vitals   03/04/24 1124  BP: (!) 148/92  Pulse: 84  SpO2: 97%  Weight: 202 lb 6.4 oz (91.8 kg)  Height: 5' 4 (1.626 m)   Body mass index is 34.74 kg/m.  Gen: resting comfortably, no acute distress HEENT: no scleral icterus, pupils equal round and reactive, no palptable cervical adenopathy,  CV: RRR, no mrg, no jvd Resp: Clear to auscultation bilaterally GI: abdomen is soft, non-tender, non-distended, normal bowel sounds, no hepatosplenomegaly MSK: extremities are warm, no edema.  Skin: warm, no rash Neuro:  no focal deficits Psych: appropriate affect     Assessment and Plan   1.SOB/Chest pain - long history of symptoms, cardiac testing over the years has been benign. -no recent chest pains, some SOB related  to recent COPD exacerbation still resolving - no cardiac testing indicated at this time.    2.HTN -recently changed from hydrochlorothiazide  to norvasc  due to multiple electrolyte abnormalities - had not started the norvasc  and also was on steroids recently, seen in ER with high bp's - now taking norvasc , bp remains above goal, increase norvasc  to 10mg  daily.  - return 2 weeks for nursing bp check   3. Hyperlipidemia - lipids at goal, continue current meds      Dorn PHEBE Ross, M.D.

## 2024-03-06 NOTE — Patient Instructions (Signed)
 Your procedure is scheduled on: 03/12/2024  Report to [facility] Main Entrance at     12:15 PM.  Call this number if you have problems the morning of surgery: [phone]   Remember:   Do not Eat or Drink after midnight         No Smoking the morning of surgery  :  Take these medicines the morning of surgery with A SIP OF WATER: Amlodipine , Buspar , Carvedilol , Celexa , and pantoprazole   Use inhalers if needed   Do not wear jewelry, make-up or nail polish.  Do not wear lotions, powders, or perfumes. You may wear deodorant.  Do not shave 48 hours prior to surgery. Men may shave face and neck.  Do not bring valuables to the hospital.  Contacts, dentures or bridgework may not be worn into surgery.  Leave suitcase in the car. After surgery it may be brought to your room.  For patients admitted to the hospital, checkout time is 11:00 AM the day of discharge.   Patients discharged the day of surgery will not be allowed to drive home.    Special Instructions: Shower using CHG night before surgery and shower the day of surgery use CHG.  Use special wash - you have one bottle of CHG for all showers.  You should use approximately 1/2 of the bottle for each shower. How to Use Chlorhexidine  at Home in the Shower Chlorhexidine  gluconate (CHG) is a germ-killing (antiseptic) wash that's used to clean the skin. It can get rid of the germs that normally live on the skin and can keep them away for about 24 hours. If you're having surgery, you may be told to shower with CHG at home the night before surgery. This can help lower your risk for infection. To use CHG wash in the shower, follow the steps below. Supplies needed: CHG body wash. Clean washcloth. Clean towel. How to use CHG in the shower Follow these steps unless you're told to use CHG in a different way: Start the shower. Use your normal soap and shampoo to wash your face and hair. Turn off the shower or move out of the shower  stream. Pour CHG onto a clean washcloth. Do not use any type of brush or rough sponge. Start at your neck, washing your body down to your toes. Make sure you: Wash the part of your body where the surgery will be done for at least 1 minute. Do not scrub. Do not use CHG on your head or face unless your health care provider tells you to. If it gets into your ears or eyes, rinse them well with water. Do not wash your genitals with CHG. Wash your back and under your arms. Make sure to wash skin folds. Let the CHG sit on your skin for 1-2 minutes or as long as told. Rinse your entire body in the shower, including all body creases and folds. Turn off the shower. Dry off with a clean towel. Do not put anything on your skin afterward, such as powder, lotion, or perfume. Put on clean clothes or pajamas. If it's the night before surgery, sleep in clean sheets. General tips Use CHG only as told, and follow the instructions on the label. Use the full amount of CHG as told. This is often one bottle. Do not smoke and stay away from flames after using CHG. Your skin may feel sticky after using CHG. This is normal. The sticky feeling will go away as the CHG dries. Do not  use CHG: If you have a chlorhexidine  allergy or have reacted to chlorhexidine  in the past. On open wounds or areas of skin that have broken skin, cuts, or scrapes. On babies younger than 81 months of age. Contact a health care provider if: You have questions about using CHG. Your skin gets irritated or itchy. You have a rash after using CHG. You swallow any CHG. Call your local poison control center [phone] in the [place] Your eyes itch badly, or they become very red or swollen. Your hearing changes. You have trouble seeing. If you can't reach your provider, go to an urgent care or emergency room. Do not drive yourself. Get help right away if: You have swelling or tingling in your mouth or throat. You make high-pitched whistling  sounds when you breathe, most often when you breathe out (wheeze). You have trouble breathing. These symptoms may be an emergency. Call 911 right away. Do not wait to see if the symptoms will go away. Do not drive yourself to the hospital. This information is not intended to replace advice given to you by your health care provider. Make sure you discuss any questions you have with your health care provider. Document Revised: 12/05/2022 Document Reviewed: 12/01/2021 Elsevier Patient Education  2024 [employer]. LEEP POST-PROCEDURE INSTRUCTIONS  You may take Ibuprofen , Aleve  or Tylenol  for pain if needed.  Cramping is normal.  You will have black and/or bloody discharge at first.  This will lighten and then turn clear before completely resolving.  This will take 2 to 3 weeks.  Put nothing in your vagina until the bleeding or discharge stops (usually 2 or3 days).  You need to call if you have redness around the biopsy site, if there is any unusual draining, if the bleeding is heavy, or if you are concerned.  Shower or bathe as normal  We will call you within one week with results or we will discuss the results at your follow-up appointment if needed.  You will need to return for a follow-up Pap smear as directed by your physician. Removing Tissue From the Uterus (Dilation and Curettage): What to Know After After having a dilation and curettage to remove tissue from your uterus, it's common to have mild pain or cramps. You may also have some bleeding or spotting from the vagina. Follow these instructions at home: Activity If you were given a sedative, do not drive or use machines until you're told it's safe. A sedative can make you sleepy. Ask if it's OK for you to lift. Do not take baths, swim, or use a hot tub until you're told it's OK. Ask if you can shower. Ask what things are safe for you to do at home. Ask when you can go back to work or school. Rest as told. Get up to take short  walks many times during the day. This helps you breathe better and keeps your blood flowing. Ask for help if you feel weak or unsteady. General instructions  Take your medicines only as told. If you're starting birth control after the procedure, ask your doctor when you should start it. Do not smoke, vape, or use nicotine  or tobacco. Doing this can slow down healing. For as long as told, do not: Douche. Use tampons. Have sex. Follow up with your doctor as told. You may need to call or meet with your doctor to get your results. Your doctor may give you more instructions. Make sure you know what you can and can't do.  Contact a doctor if: You have very bad cramps that get worse or do not get better with medicine. You have pain in your belly that's new or gets worse. You have fluid from your vagina that smells bad. You feel dizzy or light-headed. Get help right away if: You are bleeding a lot from your vagina. This means soaking more than one sanitary pad in 1 hour, and this happens for 2 hours in a row. You pass large clots from your vagina. You have a fever. Your belly feels very tender or hard. You have chest pain. You have shortness of breath. You faint. These symptoms may be an emergency. Call 911 right away. Do not wait to see if the symptoms will go away. Do not drive yourself to the hospital. This information is not intended to replace advice given to you by your health care provider. Make sure you discuss any questions you have with your health care provider. Document Revised: 03/09/2023 Document Reviewed: 03/09/2023 Elsevier Patient Education  2025 [employer]. General Anesthesia, Adult, Care After The following information offers guidance on how to care for yourself after your procedure. Your health care provider may also give you more specific instructions. If you have problems or questions, contact your health care provider. What can I expect after the procedure? After  the procedure, it is common for people to: Have pain or discomfort at the IV site. Have nausea or vomiting. Have a sore throat or hoarseness. Have trouble concentrating. Feel cold or chills. Feel weak, sleepy, or tired (fatigue). Have soreness and body aches. These can affect parts of the body that were not involved in surgery. Follow these instructions at home: For the time period you were told by your health care provider:  Rest. Do not participate in activities where you could fall or become injured. Do not drive or use machinery. Do not drink alcohol. Do not take sleeping pills or medicines that cause drowsiness. Do not make important decisions or sign legal documents. Do not take care of children on your own. General instructions Drink enough fluid to keep your urine pale yellow. If you have sleep apnea, surgery and certain medicines can increase your risk for breathing problems. Follow instructions from your health care provider about wearing your sleep device: Anytime you are sleeping, including during daytime naps. While taking prescription pain medicines, sleeping medicines, or medicines that make you drowsy. Return to your normal activities as told by your health care provider. Ask your health care provider what activities are safe for you. Take over-the-counter and prescription medicines only as told by your health care provider. Do not use any products that contain nicotine  or tobacco. These products include cigarettes, chewing tobacco, and vaping devices, such as e-cigarettes. These can delay incision healing after surgery. If you need help quitting, ask your health care provider. Contact a health care provider if: You have nausea or vomiting that does not get better with medicine. You vomit every time you eat or drink. You have pain that does not get better with medicine. You cannot urinate or have bloody urine. You develop a skin rash. You have a fever. Get help right  away if: You have trouble breathing. You have chest pain. You vomit blood. These symptoms may be an emergency. Get help right away. Call 911. Do not wait to see if the symptoms will go away. Do not drive yourself to the hospital. Summary After the procedure, it is common to have a sore throat, hoarseness, nausea,  vomiting, or to feel weak, sleepy, or fatigue. For the time period you were told by your health care provider, do not drive or use machinery. Get help right away if you have difficulty breathing, have chest pain, or vomit blood. These symptoms may be an emergency. This information is not intended to replace advice given to you by your health care provider. Make sure you discuss any questions you have with your health care provider. Document Revised: 08/19/2021 Document Reviewed: 08/19/2021 Elsevier Patient Education  2024 [employer].

## 2024-03-07 ENCOUNTER — Encounter (HOSPITAL_COMMUNITY)
Admission: RE | Admit: 2024-03-07 | Discharge: 2024-03-07 | Disposition: A | Discharge status: Home / Self Care | Source: Ambulatory Visit | Attending: Obstetrics & Gynecology | Admitting: Obstetrics & Gynecology

## 2024-03-07 ENCOUNTER — Encounter (HOSPITAL_COMMUNITY): Payer: Self-pay

## 2024-03-07 ENCOUNTER — Other Ambulatory Visit: Payer: Self-pay

## 2024-03-07 DIAGNOSIS — Z01818 Encounter for other preprocedural examination: Secondary | ICD-10-CM

## 2024-03-07 DIAGNOSIS — N871 Moderate cervical dysplasia: Secondary | ICD-10-CM | POA: Diagnosis not present

## 2024-03-07 DIAGNOSIS — Z01812 Encounter for preprocedural laboratory examination: Secondary | ICD-10-CM | POA: Diagnosis not present

## 2024-03-07 HISTORY — DX: Emphysema, unspecified: J43.9

## 2024-03-07 LAB — TYPE AND SCREEN
ABO/RH(D): A POS
Antibody Screen: NEGATIVE

## 2024-03-07 LAB — CBC
HCT: 44.5 % (ref 36.0–46.0)
Hemoglobin: 14.7 g/dL (ref 12.0–15.0)
MCH: 29.8 pg (ref 26.0–34.0)
MCHC: 33 g/dL (ref 30.0–36.0)
MCV: 90.3 fL (ref 80.0–100.0)
Platelets: 245 K/uL (ref 150–400)
RBC: 4.93 MIL/uL (ref 3.87–5.11)
RDW: 14.5 % (ref 11.5–15.5)
WBC: 16.9 K/uL — ABNORMAL HIGH (ref 4.0–10.5)
nRBC: 0 % (ref 0.0–0.2)

## 2024-03-07 NOTE — Pre-Procedure Instructions (Signed)
 Patient seen for PAT.

## 2024-03-10 ENCOUNTER — Ambulatory Visit: Admitting: Adult Health

## 2024-03-10 ENCOUNTER — Other Ambulatory Visit (HOSPITAL_COMMUNITY): Payer: Self-pay | Admitting: Adult Health

## 2024-03-10 DIAGNOSIS — Z1231 Encounter for screening mammogram for malignant neoplasm of breast: Secondary | ICD-10-CM

## 2024-03-11 NOTE — H&P (Signed)
 [facility] Attending History and Physical  Jamie Mcgee is a 63 y.o. 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  Patient notes longstanding history of ASCUS, HPV positive with no prior excisional procedure.   Additionally she had reported that typically about once every 6 months she will have an episode of light pink spotting.  An endometrial biopsy was completed at her last visit, which was negative, but showed scant atrophic tissue.   Reports no acute gyn concerns or changes.  No further bleeding.  Denies any abnormal vaginal discharge, fevers, chills, sweats, dysuria, nausea, vomiting, other GI or GU symptoms or other general symptoms.  Past Medical History:  Diagnosis Date   Allergy    Anxiety    Arthritis    Binge eating disorder    Carpal tunnel syndrome on right    Cataract    Both lenses been replaced   Cervical stenosis of spine    C5-6   COPD (chronic obstructive pulmonary disease) (HCC)    Gold III   Depression    Emphysema of lung (HCC)    Emphysema, unspecified (HCC)    Encounter for screening fecal occult blood testing 11/29/2022   Fibromuscular dysplasia of renal artery 2006   GERD (gastroesophageal reflux disease)    Headache    migraines 20 years ago   Hepatitis    type A in 1977   HNP (herniated nucleus pulposus), cervical    HPV (human papilloma virus) infection    HTN (hypertension)    Neuropathy    Sleep apnea    Vitamin D  deficiency    Past Surgical History:  Procedure Laterality Date   ANTERIOR CERVICAL DECOMP/DISCECTOMY FUSION N/A 10/05/2017   Procedure: C5-6 ANTERIOR CERVICAL DECOMPRESSION/DISCECTOMY FUSION,  ALLOGRAFT, PLATE  (NECK FIRST AND CARPAL TUNNEL RELEASE SECOND);  Surgeon: Barbarann Oneil BROCKS, MD;  Location: [facility] OR;  Service: Orthopedics;  Laterality: N/A;   APPENDECTOMY  1993    BREAST BIOPSY Right    CARPAL TUNNEL RELEASE Right 10/05/2017   Procedure: RIGHT CARPAL TUNNEL RELEASE;  Surgeon: Barbarann Oneil BROCKS, MD;  Location: [facility] OR;  Service: Orthopedics;  Laterality: Right;   CATARACT EXTRACTION  08/2010   left    CATARACT EXTRACTION W/PHACO  06/01/2011   Procedure: CATARACT EXTRACTION PHACO AND INTRAOCULAR LENS PLACEMENT (IOC);  Surgeon: Cherene Mania;  Location: [facility] ORS;  Service: Ophthalmology;  Laterality: Right;  CDE:9.62   CHOLECYSTECTOMY     COLONOSCOPY WITH PROPOFOL  N/A 07/06/2020   Procedure: COLONOSCOPY WITH PROPOFOL ;  Surgeon: Cindie Carlin MARLA, DO;  Location: [facility] ENDOSCOPY;  Service: Endoscopy;  Laterality: N/A;  11:00am   ESOPHAGEAL DILATION N/A 11/05/2023   Procedure: DILATION, ESOPHAGUS;  Surgeon: Cindie Carlin MARLA, DO;  Location: [facility] ENDO SUITE;  Service: Endoscopy;  Laterality: N/A;   ESOPHAGOGASTRODUODENOSCOPY N/A 11/05/2023   Procedure: EGD (ESOPHAGOGASTRODUODENOSCOPY);  Surgeon: Cindie Carlin MARLA, DO;  Location: [facility] ENDO SUITE;  Service: Endoscopy;  Laterality: N/A;  8:00 am, asa 3   EYE SURGERY     Cataract removal   RENAL ARTERY ANGIOPLASTY  2006   right   TUBAL LIGATION  1984   [facility]   UPPER GASTROINTESTINAL ENDOSCOPY  10/2023   OB History  Gravida Para Term Preterm AB Living  3 2 2  1 2   SAB IAB Ectopic Multiple Live Births      2    #  Outcome Date GA Lbr Len/2nd Weight Sex Type Anes PTL Lv  3 Term     F Vag-Spont   LIV  2 Term     F Vag-Spont   LIV  1 AB           Patient denies any other pertinent gynecologic issues.  No current facility-administered medications on file prior to encounter.   Current Outpatient Medications on File Prior to Encounter  Medication Sig Dispense Refill   albuterol  (PROVENTIL ) (2.5 MG/3ML) 0.083% nebulizer solution Take 3 mLs (2.5 mg total) by nebulization every 6 (six) hours as needed for wheezing or shortness of breath. 75 mL 5   albuterol  (VENTOLIN  HFA) 108 (90 Base) MCG/ACT inhaler INHALE 2 PUFFS INTO THE  LUNGS EVERY 6 HOURS AS NEEDED FOR WHEEZING OR SHORTNESS OF BREATH 18 g 5   Budeson-Glycopyrrol-Formoterol  (BREZTRI  AEROSPHERE) 160-9-4.8 MCG/ACT AERO USE 2 INHALATIONS BY MOUTH TWICE DAILY 32.1 g 7   busPIRone  (BUSPAR ) 15 MG tablet TAKE ONE TABLET BY MOUTH TWICE DAILY FOR ANXIETY 90 tablet 1   Calcium  Carb-Cholecalciferol  (CALCIUM  600+D3) 600-20 MG-MCG TABS Take 1 tablet by mouth daily.     cariprazine  (VRAYLAR ) 1.5 MG capsule Take 1 capsule (1.5 mg total) by mouth daily. (Patient taking differently: Take 1.5 mg by mouth at bedtime.) 90 capsule 3   carvedilol  (COREG ) 6.25 MG tablet Take 1 tablet (6.25 mg total) by mouth 2 (two) times daily with a meal. 200 tablet 1   Cholecalciferol  (VITAMIN D3) 50 MCG (2000 UT) capsule Take 2,000 Units by mouth daily.     citalopram  (CELEXA ) 40 MG tablet Take 1 tablet (40 mg total) by mouth daily. 90 tablet 3   fexofenadine (ALLEGRA) 180 MG tablet Take 180 mg by mouth daily.     fluticasone  (FLONASE ) 50 MCG/ACT nasal spray SHAKE LIQUID AND USE 2 SPRAYS IN EACH NOSTRIL DAILY 16 g 6   losartan  (COZAAR ) 100 MG tablet Take 1 tablet (100 mg total) by mouth daily. 100 tablet 1   Misc Natural Products (COLON CLEANSE PO) Take 1 capsule by mouth at bedtime.     pantoprazole  (PROTONIX ) 20 MG tablet TAKE 1 TABLET BY MOUTH DAILY FOR ACID REFLUX 100 tablet 1   Polyethyl Glyc-Propyl Glyc PF (SYSTANE HYDRATION PF) 0.4-0.3 % SOLN Place 1 drop into both eyes daily as needed (Dry eye).     rosuvastatin  (CRESTOR ) 5 MG tablet Take 1 tablet (5 mg total) by mouth at bedtime. 100 tablet 1   Allergies  Allergen Reactions   Lipitor [Atorvastatin ] Other (See Comments)    Muscle aches and nausea   Naproxen  Nausea And Vomiting    Stomach pain Nsaid    Social History:   reports that she has been smoking cigarettes. She started smoking about 42 years ago. She has a 40 pack-year smoking history. She has been exposed to tobacco smoke. She has never used smokeless tobacco. She reports  current drug use. Frequency: 7.00 times per week. Drug: Marijuana. She reports that she does not drink alcohol. Family History  Problem Relation Age of Onset   Cancer Mother        breast   Anxiety disorder Mother    Arthritis Mother    Depression Mother    Obesity Mother    Coronary artery disease Father        States congenital abnormality   Diabetes Father    Hyperlipidemia Father    Early death Father    CAD Sister  Stents    Cancer Sister        breast   Anxiety disorder Sister    Asthma Sister    Depression Sister    Obesity Sister    Cancer Maternal Grandmother        breast   Heart disease Maternal Grandfather    Cancer Maternal Aunt        breast   Anesthesia problems Neg Hx    Hypotension Neg Hx    Malignant hyperthermia Neg Hx    Pseudochol deficiency Neg Hx    Colon cancer Neg Hx    Liver disease Neg Hx     Review of Systems: Pertinent items noted in HPI and remainder of comprehensive ROS otherwise negative.  PHYSICAL EXAM: Blood pressure (!) 141/78, pulse 71, temperature 97.9 F (36.6 C), temperature source Oral, resp. rate (!) 23, height 5' 5 (1.651 m), weight 91.8 kg, last menstrual period 05/25/2011, SpO2 97%. CONSTITUTIONAL: Well-developed, well-nourished female in no acute distress.  SKIN: Skin is warm and dry. No rash noted. Not diaphoretic. No erythema. No pallor. NEUROLOGIC: Alert and oriented to person, place, and time. Normal reflexes, muscle tone coordination. No cranial nerve deficit noted. PSYCHIATRIC: Normal mood and affect. Normal behavior. Normal judgment and thought content. CARDIOVASCULAR: Normal heart rate noted, regular rhythm RESPIRATORY: Effort and breath sounds normal, no problems with respiration noted ABDOMEN: Soft, nontender, nondistended. PELVIC: deferred MUSCULOSKELETAL: no calf tenderness bilaterally EXT: no edema bilaterally, normal pulses  Labs: Results for orders placed or performed during the hospital encounter  of 03/07/24 (from the past 2 weeks)  Type and screen [facility]   Collection Time: 03/07/24 11:34 AM  Result Value Ref Range   ABO/RH(D) A POS    Antibody Screen NEG    Sample Expiration      03/10/2024,2359 Performed at [facility], [address] [place], [place] [address]   CBC   Collection Time: 03/07/24 11:35 AM  Result Value Ref Range   WBC 16.9 (H) 4.0 - 10.5 K/uL   RBC 4.93 3.87 - 5.11 MIL/uL   Hemoglobin 14.7 12.0 - 15.0 g/dL   HCT 55.4 63.9 - 53.9 %   MCV 90.3 80.0 - 100.0 fL   MCH 29.8 26.0 - 34.0 pg   MCHC 33.0 30.0 - 36.0 g/dL   RDW 85.4 88.4 - 84.4 %   Platelets 245 150 - 400 K/uL   nRBC 0.0 0.0 - 0.2 %  Results for orders placed or performed during the hospital encounter of 03/01/24 (from the past 2 weeks)  Basic metabolic panel   Collection Time: 03/01/24  3:04 PM  Result Value Ref Range   Sodium 138 135 - 145 mmol/L   Potassium 4.6 3.5 - 5.1 mmol/L   Chloride 100 98 - 111 mmol/L   CO2 26 22 - 32 mmol/L   Glucose, Bld 106 (H) 70 - 99 mg/dL   BUN <5 (L) 8 - 23 mg/dL   Creatinine, Ser 9.24 0.44 - 1.00 mg/dL   Calcium  9.0 8.9 - 10.3 mg/dL   GFR, Estimated >39 >39 mL/min   Anion gap 12 5 - 15  Troponin I (High Sensitivity)   Collection Time: 03/01/24  3:04 PM  Result Value Ref Range   Troponin I (High Sensitivity) 4 <18 ng/L  Brain natriuretic peptide   Collection Time: 03/01/24  3:40 PM  Result Value Ref Range   B Natriuretic Peptide 226.0 (H) 0.0 - 100.0 pg/mL  CBC with Differential/Platelet   Collection  Time: 03/01/24  3:49 PM  Result Value Ref Range   WBC 10.9 (H) 4.0 - 10.5 K/uL   RBC 4.76 3.87 - 5.11 MIL/uL   Hemoglobin 14.4 12.0 - 15.0 g/dL   HCT 56.6 63.9 - 53.9 %   MCV 91.0 80.0 - 100.0 fL   MCH 30.3 26.0 - 34.0 pg   MCHC 33.3 30.0 - 36.0 g/dL   RDW 85.9 88.4 - 84.4 %   Platelets 185 150 - 400 K/uL   nRBC 0.0 0.0 - 0.2 %   Neutrophils Relative % 64 %   Neutro Abs 7.0 1.7 - 7.7 K/uL   Lymphocytes Relative 28 %   Lymphs Abs 3.0  0.7 - 4.0 K/uL   Monocytes Relative 6 %   Monocytes Absolute 0.7 0.1 - 1.0 K/uL   Eosinophils Relative 1 %   Eosinophils Absolute 0.1 0.0 - 0.5 K/uL   Basophils Relative 0 %   Basophils Absolute 0.0 0.0 - 0.1 K/uL   Immature Granulocytes 1 %   Abs Immature Granulocytes 0.07 0.00 - 0.07 K/uL  Troponin I (High Sensitivity)   Collection Time: 03/01/24  4:48 PM  Result Value Ref Range   Troponin I (High Sensitivity) 5 <18 ng/L    Imaging Studies: US  12/2023: 7x4x2cm uterus, endometrium 4mm.  Small focal echogenic process in the uterus consistent with subendometrial calcification which is a stable finding. There are no uterine masses.  Normal ovaries bilaterally  Assessment: High-grade cervical dysplasia - Postmenopausal bleeding  Plan: Hysteroscopy, D&C, LEEP -NPO -LR @ 125cc/hr -SCDs to OR -Risk/benefits and alternatives reviewed with the patient including but not limited to risk of bleeding, infection and injury to surrounding organs due to uterine perforation requiring further surgical intervention..  Questions and concerns were addressed and pt desires to proceed  Seleen Walter, DO Attending Obstetrician & Gynecologist, [facility] for [facility], [facility]

## 2024-03-12 ENCOUNTER — Encounter (HOSPITAL_COMMUNITY)
Admission: RE | Disposition: A | Discharge status: Home / Self Care | Payer: Self-pay | Source: Home / Self Care | Attending: Obstetrics & Gynecology

## 2024-03-12 ENCOUNTER — Ambulatory Visit (HOSPITAL_COMMUNITY)

## 2024-03-12 ENCOUNTER — Other Ambulatory Visit: Payer: Self-pay

## 2024-03-12 ENCOUNTER — Ambulatory Visit (HOSPITAL_COMMUNITY)
Admission: RE | Admit: 2024-03-12 | Discharge: 2024-03-12 | Disposition: A | Discharge status: Home / Self Care | Attending: Obstetrics & Gynecology | Admitting: Obstetrics & Gynecology

## 2024-03-12 ENCOUNTER — Encounter (HOSPITAL_COMMUNITY): Payer: Self-pay | Admitting: Obstetrics & Gynecology

## 2024-03-12 DIAGNOSIS — N871 Moderate cervical dysplasia: Secondary | ICD-10-CM | POA: Diagnosis not present

## 2024-03-12 DIAGNOSIS — F17210 Nicotine dependence, cigarettes, uncomplicated: Secondary | ICD-10-CM

## 2024-03-12 DIAGNOSIS — I10 Essential (primary) hypertension: Secondary | ICD-10-CM | POA: Insufficient documentation

## 2024-03-12 DIAGNOSIS — G4730 Sleep apnea, unspecified: Secondary | ICD-10-CM | POA: Insufficient documentation

## 2024-03-12 DIAGNOSIS — F419 Anxiety disorder, unspecified: Secondary | ICD-10-CM | POA: Diagnosis not present

## 2024-03-12 DIAGNOSIS — Z538 Procedure and treatment not carried out for other reasons: Secondary | ICD-10-CM | POA: Insufficient documentation

## 2024-03-12 DIAGNOSIS — Z8619 Personal history of other infectious and parasitic diseases: Secondary | ICD-10-CM | POA: Insufficient documentation

## 2024-03-12 DIAGNOSIS — J449 Chronic obstructive pulmonary disease, unspecified: Secondary | ICD-10-CM

## 2024-03-12 DIAGNOSIS — E66813 Obesity, class 3: Secondary | ICD-10-CM | POA: Insufficient documentation

## 2024-03-12 DIAGNOSIS — J439 Emphysema, unspecified: Secondary | ICD-10-CM | POA: Insufficient documentation

## 2024-03-12 DIAGNOSIS — F32A Depression, unspecified: Secondary | ICD-10-CM | POA: Insufficient documentation

## 2024-03-12 DIAGNOSIS — K219 Gastro-esophageal reflux disease without esophagitis: Secondary | ICD-10-CM | POA: Insufficient documentation

## 2024-03-12 DIAGNOSIS — N950 Postmenopausal bleeding: Secondary | ICD-10-CM | POA: Diagnosis present

## 2024-03-12 DIAGNOSIS — F17200 Nicotine dependence, unspecified, uncomplicated: Secondary | ICD-10-CM | POA: Diagnosis not present

## 2024-03-12 DIAGNOSIS — Z6833 Body mass index (BMI) 33.0-33.9, adult: Secondary | ICD-10-CM | POA: Insufficient documentation

## 2024-03-12 DIAGNOSIS — Z01818 Encounter for other preprocedural examination: Secondary | ICD-10-CM

## 2024-03-12 HISTORY — PX: HYSTEROSCOPY WITH D & C: SHX1775

## 2024-03-12 HISTORY — PX: LEEP: SHX91

## 2024-03-12 SURGERY — DILATATION AND CURETTAGE /HYSTEROSCOPY
Anesthesia: General | Site: Vagina

## 2024-03-12 MED ORDER — LIDOCAINE-EPINEPHRINE 0.5 %-1:200000 IJ SOLN
INTRAMUSCULAR | Status: AC
  Filled 2024-03-12: qty 50

## 2024-03-12 MED ORDER — 0.9 % SODIUM CHLORIDE (POUR BTL) OPTIME
TOPICAL | Status: DC | PRN
  Administered 2024-03-12: 500 mL

## 2024-03-12 MED ORDER — MONSELS FERRIC SUBSULFATE EX SOLN
CUTANEOUS | Status: AC
  Filled 2024-03-12: qty 8

## 2024-03-12 MED ORDER — ACETIC ACID 4% SOLUTION
Status: DC | PRN
  Administered 2024-03-12: 1 via TOPICAL

## 2024-03-12 MED ORDER — ORAL CARE MOUTH RINSE: 15.0000 mL | Freq: Once | OROMUCOSAL | Status: AC

## 2024-03-12 MED ORDER — MIDAZOLAM HCL 2 MG/2ML IJ SOLN
INTRAMUSCULAR | Status: AC
  Filled 2024-03-12: qty 2

## 2024-03-12 MED ORDER — MONSELS FERRIC SUBSULFATE EX SOLN
CUTANEOUS | Status: DC | PRN
  Administered 2024-03-12: 1 via TOPICAL

## 2024-03-12 MED ORDER — OXYCODONE HCL 5 MG/5ML PO SOLN: 5.0000 mg | Freq: Once | ORAL | Status: DC | PRN

## 2024-03-12 MED ORDER — ONDANSETRON HCL 4 MG/2ML IJ SOLN
INTRAMUSCULAR | Status: DC | PRN
Start: 2024-03-12 — End: 2024-03-12
  Administered 2024-03-12: 4 mg via INTRAVENOUS

## 2024-03-12 MED ORDER — LIDOCAINE 2% (20 MG/ML) 5 ML SYRINGE
INTRAMUSCULAR | Status: AC
  Filled 2024-03-12: qty 5

## 2024-03-12 MED ORDER — CHLORHEXIDINE GLUCONATE 0.12 % MT SOLN
OROMUCOSAL | Status: AC
  Filled 2024-03-12: qty 15

## 2024-03-12 MED ORDER — ORAL CARE MOUTH RINSE: 15.0000 mL | Freq: Once | OROMUCOSAL | Status: DC

## 2024-03-12 MED ORDER — MIDAZOLAM HCL 2 MG/2ML IJ SOLN
INTRAMUSCULAR | Status: DC | PRN
Start: 2024-03-12 — End: 2024-03-12
  Administered 2024-03-12: 2 mg via INTRAVENOUS

## 2024-03-12 MED ORDER — FENTANYL CITRATE (PF) 100 MCG/2ML IJ SOLN
INTRAMUSCULAR | Status: AC
  Filled 2024-03-12: qty 2

## 2024-03-12 MED ORDER — FENTANYL CITRATE (PF) 100 MCG/2ML IJ SOLN
INTRAMUSCULAR | Status: DC | PRN
  Administered 2024-03-12: 100 ug via INTRAVENOUS

## 2024-03-12 MED ORDER — POVIDONE-IODINE 10 % EX SWAB: 2.0000 | Freq: Once | CUTANEOUS | Status: DC

## 2024-03-12 MED ORDER — PROPOFOL 10 MG/ML IV BOLUS
INTRAVENOUS | Status: AC
  Filled 2024-03-12: qty 20

## 2024-03-12 MED ORDER — LIDOCAINE-EPINEPHRINE 0.5 %-1:200000 IJ SOLN
INTRAMUSCULAR | Status: DC | PRN
  Administered 2024-03-12: 15 mL

## 2024-03-12 MED ORDER — PROPOFOL 10 MG/ML IV BOLUS
INTRAVENOUS | Status: DC | PRN
  Administered 2024-03-12: 160 mg via INTRAVENOUS

## 2024-03-12 MED ORDER — IPRATROPIUM-ALBUTEROL 0.5-2.5 (3) MG/3ML IN SOLN
RESPIRATORY_TRACT | Status: AC
  Administered 2024-03-12: 3 mL via RESPIRATORY_TRACT
  Filled 2024-03-12: qty 3

## 2024-03-12 MED ORDER — IPRATROPIUM-ALBUTEROL 0.5-2.5 (3) MG/3ML IN SOLN: 3.0000 mL | Freq: Once | RESPIRATORY_TRACT | Status: AC

## 2024-03-12 MED ORDER — LACTATED RINGERS IV SOLN: INTRAVENOUS | Status: DC

## 2024-03-12 MED ORDER — CHLORHEXIDINE GLUCONATE 0.12 % MT SOLN
15.0000 mL | Freq: Once | OROMUCOSAL | Status: AC
  Administered 2024-03-12: 15 mL via OROMUCOSAL

## 2024-03-12 MED ORDER — CHLORHEXIDINE GLUCONATE 0.12 % MT SOLN: 15.0000 mL | Freq: Once | OROMUCOSAL | Status: DC

## 2024-03-12 MED ORDER — OXYCODONE HCL 5 MG PO TABS: 5.0000 mg | ORAL_TABLET | Freq: Once | ORAL | Status: DC | PRN

## 2024-03-12 MED ORDER — FENTANYL CITRATE PF 50 MCG/ML IJ SOSY: 25.0000 ug | PREFILLED_SYRINGE | INTRAMUSCULAR | Status: DC | PRN

## 2024-03-12 MED ORDER — LIDOCAINE 2% (20 MG/ML) 5 ML SYRINGE
INTRAMUSCULAR | Status: DC | PRN
Start: 2024-03-12 — End: 2024-03-12
  Administered 2024-03-12: 100 mg via INTRAVENOUS

## 2024-03-12 SURGICAL SUPPLY — 35 items
APPLICATOR COTTON TIP 6 STRL (MISCELLANEOUS) ×3 IMPLANT
CLOTH BEACON ORANGE TIMEOUT ST (SAFETY) ×3 IMPLANT
COVER LIGHT HANDLE (MISCELLANEOUS) IMPLANT
COVER LIGHT HANDLE STERIS (MISCELLANEOUS) ×9 IMPLANT
ELECT BALL LEEP 5MM RED (ELECTRODE) ×2 IMPLANT
ELECTRODE LOOP LP RND 15X12GRN (CUTTING LOOP) IMPLANT
ELECTRODE LOOP LP SQR 10X10ORG (CUTTING LOOP) ×3 IMPLANT
ELECTRODE REM PT RTRN 9FT ADLT (ELECTROSURGICAL) ×2 IMPLANT
GAUZE 4X4 16PLY ~~LOC~~+RFID DBL (SPONGE) ×6 IMPLANT
GLOVE BIO SURGEON STRL SZ 6.5 (GLOVE) ×3 IMPLANT
GLOVE BIO SURGEON STRL SZ7 (GLOVE) ×2 IMPLANT
GLOVE BIOGEL PI IND STRL 7.0 (GLOVE) ×9 IMPLANT
GOWN STRL REUS W/ TWL LRG LVL3 (GOWN DISPOSABLE) ×3 IMPLANT
GOWN STRL REUS W/TWL LRG LVL3 (GOWN DISPOSABLE) ×3 IMPLANT
KIT PROCEDURE FLUENT (KITS) ×3 IMPLANT
KIT TURNOVER CYSTO (KITS) ×3 IMPLANT
KIT TURNOVER KIT A (KITS) ×3 IMPLANT
NDL HYPO 18GX1.5 BLUNT FILL (NEEDLE) ×2 IMPLANT
NEEDLE HYPO 18GX1.5 BLUNT FILL (NEEDLE) ×2 IMPLANT
NS IRRIG 1000ML POUR BTL (IV SOLUTION) ×3 IMPLANT
NS IRRIG 500ML POUR BTL (IV SOLUTION) ×2 IMPLANT
PACK PERI GYN (CUSTOM PROCEDURE TRAY) ×3 IMPLANT
PAD ARMBOARD POSITIONER FOAM (MISCELLANEOUS) ×3 IMPLANT
PAD TELFA 3X4 1S STER (GAUZE/BANDAGES/DRESSINGS) ×3 IMPLANT
POSITIONER HEAD 8X9X4 ADT (SOFTGOODS) ×3 IMPLANT
SCOPETTES 8 STERILE (MISCELLANEOUS) ×3 IMPLANT
SEAL ROD LENS SCOPE MYOSURE (ABLATOR) ×3 IMPLANT
SET BASIN LINEN APH (SET/KITS/TRAYS/PACK) ×3 IMPLANT
SOL .9 NS 3000ML IRR UROMATIC (IV SOLUTION) ×3 IMPLANT
SOL PREP POV-IOD 4OZ 10% (MISCELLANEOUS) ×2 IMPLANT
SUT VIC AB 0 CT1 27XBRD ANBCTR (SUTURE) ×3 IMPLANT
SYR 30ML LL (SYRINGE) ×3 IMPLANT
SYR CONTROL 10ML LL (SYRINGE) ×2 IMPLANT
TOWEL OR 17X26 4PK STRL BLUE (TOWEL DISPOSABLE) ×2 IMPLANT
UNDERPAD 30X36 HEAVY ABSORB (UNDERPADS AND DIAPERS) ×3 IMPLANT

## 2024-03-12 NOTE — Anesthesia Procedure Notes (Signed)
 Procedure Name: LMA Insertion Date/Time: 03/12/2024 2:50 PM  Performed by: Cordella Elvie HERO, CRNAPre-anesthesia Checklist: Patient identified, Emergency Drugs available, Suction available, Patient being monitored and Timeout performed Patient Re-evaluated:Patient Re-evaluated prior to induction Oxygen  Delivery Method: Circle system utilized Preoxygenation: Pre-oxygenation with 100% oxygen  Induction Type: IV induction LMA: LMA inserted LMA Size: 3.0 Number of attempts: 1 Placement Confirmation: positive ETCO2, CO2 detector and breath sounds checked- equal and bilateral Tube secured with: Tape Dental Injury: Teeth and Oropharynx as per pre-operative assessment

## 2024-03-12 NOTE — Discharge Instructions (Addendum)
 HOME INSTRUCTIONS  Please note any unusual or excessive bleeding, pain, swelling. Mild dizziness or drowsiness are normal for about 24 hours after surgery.   Shower when comfortable  Restrictions: No driving for 24 hours or while taking pain medications.  Activity:  No heavy lifting (> 20 lbs), nothing in vagina (no tampons, douching, or intercourse) x 4 weeks; no tub baths for 4 weeks Vaginal spotting is expected but if your bleeding is heavy, period like,  please call the office   Diet:  You may return to your regular diet.  Do not eat large meals.  Eat small frequent meals throughout the day.  Continue to drink a good amount of water at least 6-8 glasses of water per day, hydration is very important for the healing process.  Pain Management: Take over the counter tylenol  or ibuprofen  as needed for pain.  You can either take one or alternate between the two medications for pain management.  You may also use a heating pack as needed.    Alcohol -- Avoid for 24 hours and while taking pain medications.  Nausea: Take sips of ginger ale or soda  Fever -- Call physician if temperature over 101 degrees  Follow up:  If you do not already have a follow up appointment scheduled, please call the office at [phone].  If you experience fever (a temperature greater than 100.4), pain unrelieved by pain medication, shortness of breath, swelling of a single leg, or any other symptoms which are concerning to you please the office immediately. HOME INSTRUCTIONS  Please note any unusual or excessive bleeding, pain, swelling. Mild dizziness or drowsiness are normal for about 24 hours after surgery.   Shower when comfortable  Restrictions: No driving for 24 hours or while taking pain medications.  Activity:  No heavy lifting (> 10 lbs), nothing in vagina (no tampons, douching, or intercourse) x 4 weeks; no tub baths for 4 weeks Vaginal spotting is expected but if your bleeding is heavy, period like,   please call the office   Diet:  You may return to your regular diet.  Do not eat large meals.  Eat small frequent meals throughout the day.  Continue to drink a good amount of water at least 6-8 glasses of water per day, hydration is very important for the healing process.  Pain Management: Take over the counter tylenol  or ibuprofen  as needed for pain.  You can either take one or alternate between the two medications for pain management.  You may also use a heating pack as needed.    Alcohol -- Avoid for 24 hours and while taking pain medications.  Nausea: Take sips of ginger ale or soda  Fever -- Call physician if temperature over 101 degrees  Follow up:  If you do not already have a follow up appointment scheduled, please call the office at [phone].  If you experience fever (a temperature greater than 100.4), pain unrelieved by pain medication, shortness of breath, swelling of a single leg, or any other symptoms which are concerning to you please the office immediately.

## 2024-03-12 NOTE — Op Note (Signed)
 Operative Report  PreOp: Postmenopausal bleeding, High grade cervical dysplasia PostOp: same Procedure:  Hysteroscopy, LEEP Surgeon: Dr. Jaquell Seddon Anesthesia: General Complications:none EBL: Minimal UOP: prior to surgery IVF:500  Discrepancy: minimal  Findings: 7cm uterus with thin endometrium- no abnormalities visualized.  Both ostia visualized.  Specimens: cervical biopsy  Procedure: The patient was taken to the operating room where she underwent general anesthesia without difficulty. The patient was placed in a low lithotomy position using Allen stirrups. She was then prepped and draped in the normal sterile fashion. A bivalved coated speculum was placed.  A single tooth tenaculum was placed on the anterior lip of the cervix. Cervical block was completed using 0.5% lidocaine  with epinephrine .  The uterus was then sounded to 7. The endocervical canal was then serially dilated to 19French using Hank dilators to accommodate the hysteroscopic apparatus.  The hysteroscope was inserted and findings were visualized as noted above- no abnormalities were noted.  The hysteroscope was removed- the curettage device could not be passed through the os despite further dilation.  Based on hysteroscope findings and prior negative EMB, the curettage was abandoned to prevent uterine injury/perforation.  Attention was then turned to the LEEP portion of the procedure.  Acetic acid solution was applied to the cervix- no abnormalities were noted.  The suction was turned on and the Medium 1X Fisher Cone Biopsy Excisor on 50 Watts of blended current was used to excise the area of decreased uptake and excise the entire transformation zone. Excellent hemostasis was achieved using roller ball coagulation set at 50 Watts coagulation current. Monsel's solution was then applied and the speculum was removed from the vagina. Specimens were sent to pathology.  All instrument were then removed. Hemostasis was observed at  the cervical site. The patient was repositioned to the supine position. The patient tolerated the procedure without any complications and taken to recovery in stable condition.   [place]ua, DO Attending Obstetrician & Gynecologist, [facility] for [employer], [facility]

## 2024-03-12 NOTE — Transfer of Care (Signed)
 Immediate Anesthesia Transfer of Care Note  Patient: Jamie Mcgee  Procedure(s) Performed: HYSTEROSCOPY (Vagina ) LEEP (LOOP ELECTROSURGICAL EXCISION PROCEDURE) (Cervix)  Patient Location: PACU  Anesthesia Type:General  Level of Consciousness: awake, alert , oriented, and patient cooperative  Airway & Oxygen  Therapy: Patient Spontanous Breathing and Patient connected to nasal cannula oxygen   Post-op Assessment: Report given to RN, Post -op Vital signs reviewed and stable, and Patient moving all extremities X 4  Post vital signs: Reviewed and stable  Last Vitals:  Vitals Value Taken Time  BP 145/82 03/12/24 15:31  Temp 36.4 C 03/12/24 15:26  Pulse 70 03/12/24 15:46  Resp 19 03/12/24 15:46  SpO2 91 % 03/12/24 15:46  Vitals shown include unfiled device data.  Last Pain:  Vitals:   03/12/24 1530  TempSrc:   PainSc: 0-No pain      Patients Stated Pain Goal: 3 (03/12/24 1240)  Complications: No notable events documented.

## 2024-03-12 NOTE — Anesthesia Preprocedure Evaluation (Addendum)
 Anesthesia Evaluation  Patient identified by MRN, date of birth, ID band Patient awake    Reviewed: Allergy & Precautions, H&P , NPO status , Patient's Chart, lab work & pertinent test results  Airway Mallampati: III  TM Distance: >3 FB Neck ROM: Full  Mouth opening: Limited Mouth Opening  Dental no notable dental hx. (+) Edentulous Upper, Missing   Pulmonary sleep apnea , COPD, Current Smoker Marijuana daily; last used yesterday morning  Duo neb ordered preop  + rhonchi  + decreased breath sounds      Cardiovascular hypertension, Normal cardiovascular exam Rhythm:Regular Rate:Normal  Ef 2016 60-65% EKG 2025 nl   Neuro/Psych  Headaches PSYCHIATRIC DISORDERS Anxiety Depression     Neuromuscular disease    GI/Hepatic ,GERD  ,,(+) Hepatitis -  Endo/Other    Class 3 obesity  Renal/GU negative Renal ROS  negative genitourinary   Musculoskeletal  (+) Arthritis ,    Abdominal   Peds negative pediatric ROS (+)  Hematology negative hematology ROS (+)   Anesthesia Other Findings   Reproductive/Obstetrics negative OB ROS                              Anesthesia Physical Anesthesia Plan  ASA: 3  Anesthesia Plan: General   Post-op Pain Management:    Induction: Intravenous  PONV Risk Score and Plan:   Airway Management Planned: Oral ETT  Additional Equipment:   Intra-op Plan:   Post-operative Plan: Extubation in OR  Informed Consent: I have reviewed the patients History and Physical, chart, labs and discussed the procedure including the risks, benefits and alternatives for the proposed anesthesia with the patient or authorized representative who has indicated his/her understanding and acceptance.     Dental advisory given  Plan Discussed with: CRNA  Anesthesia Plan Comments:          Anesthesia Quick Evaluation

## 2024-03-13 ENCOUNTER — Encounter (HOSPITAL_COMMUNITY): Payer: Self-pay | Admitting: Obstetrics & Gynecology

## 2024-03-13 NOTE — Anesthesia Postprocedure Evaluation (Signed)
 Anesthesia Post Note  Patient: Jamie Mcgee  Procedure(s) Performed: HYSTEROSCOPY (Vagina ) LEEP (LOOP ELECTROSURGICAL EXCISION PROCEDURE) (Cervix)  Anesthesia Type: General Anesthetic complications: no   No notable events documented.   Last Vitals:  Vitals:   03/12/24 1600 03/12/24 1613  BP: 125/72 139/77  Pulse: 75 70  Resp: 16 20  Temp:  36.5 C  SpO2: 93% 94%    Last Pain:  Vitals:   03/12/24 1613  TempSrc: Oral  PainSc: 0-No pain                 Andrea Limes

## 2024-03-17 ENCOUNTER — Ambulatory Visit: Payer: Self-pay | Admitting: Obstetrics & Gynecology

## 2024-03-17 ENCOUNTER — Emergency Department (HOSPITAL_COMMUNITY)
Admission: EM | Admit: 2024-03-17 | Discharge: 2024-03-18 | Disposition: A | Discharge status: Home / Self Care | Attending: Emergency Medicine | Admitting: Emergency Medicine

## 2024-03-17 ENCOUNTER — Other Ambulatory Visit: Payer: Self-pay

## 2024-03-17 ENCOUNTER — Encounter (HOSPITAL_COMMUNITY): Payer: Self-pay | Admitting: Emergency Medicine

## 2024-03-17 DIAGNOSIS — Z7951 Long term (current) use of inhaled steroids: Secondary | ICD-10-CM | POA: Insufficient documentation

## 2024-03-17 DIAGNOSIS — J441 Chronic obstructive pulmonary disease with (acute) exacerbation: Secondary | ICD-10-CM | POA: Diagnosis not present

## 2024-03-17 DIAGNOSIS — Z79899 Other long term (current) drug therapy: Secondary | ICD-10-CM | POA: Diagnosis not present

## 2024-03-17 DIAGNOSIS — I10 Essential (primary) hypertension: Secondary | ICD-10-CM | POA: Diagnosis not present

## 2024-03-17 DIAGNOSIS — R0602 Shortness of breath: Secondary | ICD-10-CM | POA: Diagnosis present

## 2024-03-17 LAB — SURGICAL PATHOLOGY

## 2024-03-17 NOTE — ED Triage Notes (Signed)
 Pt c/o sob for over 2 months, but states her 02 was 88% at home tonight.

## 2024-03-18 ENCOUNTER — Emergency Department (HOSPITAL_COMMUNITY)

## 2024-03-18 DIAGNOSIS — R059 Cough, unspecified: Secondary | ICD-10-CM | POA: Diagnosis not present

## 2024-03-18 DIAGNOSIS — R0789 Other chest pain: Secondary | ICD-10-CM | POA: Diagnosis not present

## 2024-03-18 DIAGNOSIS — J439 Emphysema, unspecified: Secondary | ICD-10-CM | POA: Diagnosis not present

## 2024-03-18 DIAGNOSIS — R0602 Shortness of breath: Secondary | ICD-10-CM | POA: Diagnosis not present

## 2024-03-18 LAB — RESP PANEL BY RT-PCR (RSV, FLU A&B, COVID)  RVPGX2
Influenza A by PCR: NEGATIVE
Influenza B by PCR: NEGATIVE
Resp Syncytial Virus by PCR: NEGATIVE
SARS Coronavirus 2 by RT PCR: NEGATIVE

## 2024-03-18 MED ORDER — IPRATROPIUM-ALBUTEROL 0.5-2.5 (3) MG/3ML IN SOLN
3.0000 mL | Freq: Once | RESPIRATORY_TRACT | Status: AC
  Administered 2024-03-18: 3 mL via RESPIRATORY_TRACT
  Filled 2024-03-18: qty 3

## 2024-03-18 MED ORDER — PREDNISONE 10 MG (21) PO TBPK: ORAL_TABLET | ORAL | 0 refills | Status: DC

## 2024-03-18 MED ORDER — PREDNISONE 50 MG PO TABS
60.0000 mg | ORAL_TABLET | Freq: Once | ORAL | Status: AC
  Administered 2024-03-18: 60 mg via ORAL
  Filled 2024-03-18: qty 1

## 2024-03-18 NOTE — Discharge Instructions (Signed)
 Continue using your inhalers and nebulizers as needed.  It is possible that she will need to be on a low-dose of prednisone  on a daily basis.  Please follow-up with your primary care provider to see how you are doing as your prednisone  dose is being decreased.  At any point, if breathing is not being managed adequately at home, please return to the emergency department.

## 2024-03-18 NOTE — ED Provider Notes (Signed)
 [place] EMERGENCY DEPARTMENT AT [facility] Provider Note   CSN: [id number] Arrival date & time: 03/17/24  2346     Patient presents with: Shortness of Breath   Jamie Mcgee is a 63 y.o. female.   The history is provided by the patient.  Shortness of Breath  She has history of hypertension, COPD, GERD and comes in because of worsening shortness of breath and low oxygen  saturation today.  She states that her breathing has been bad for about the last 3-4 months, but she usually gets relief from her nebulizer and rescue inhaler.  Tonight, she was not getting any relief.  She does endorse a cough which is productive of sputum but she does not look at it and does not know the color.  She has some generalized bodyaches but states that pain in her scapular areas bilaterally is new.  She checked her oxygen  saturation tonight, and it was at 88% which is unusual for her.  She states her oxygen  saturations are usually 94-96%.  She denies fever, chills, sweats.  She has not received the influenza vaccine this year, has not received RSV vaccine, has not received the most recent coronavirus vaccine but has received the pneumonia vaccine.  She does still smoke.  Of note, she states that she had completed a course of steroids about a week ago.    Prior to Admission medications   Medication Sig Start Date End Date Taking? Authorizing Provider  albuterol  (PROVENTIL ) (2.5 MG/3ML) 0.083% nebulizer solution Take 3 mLs (2.5 mg total) by nebulization every 6 (six) hours as needed for wheezing or shortness of breath. 01/10/24 01/09/25  Parrett, Madelin RAMAN, NP  albuterol  (VENTOLIN  HFA) 108 (90 Base) MCG/ACT inhaler INHALE 2 PUFFS INTO THE LUNGS EVERY 6 HOURS AS NEEDED FOR WHEEZING OR SHORTNESS OF BREATH 08/30/23   Jude Harden GAILS, MD  amLODipine  (NORVASC ) 10 MG tablet Take 1 tablet (10 mg total) by mouth daily. 03/04/24   Alvan Dorn FALCON, MD  Budeson-Glycopyrrol-Formoterol  (BREZTRI  AEROSPHERE) 160-9-4.8  MCG/ACT AERO USE 2 INHALATIONS BY MOUTH TWICE DAILY 06/19/23   Jude Harden GAILS, MD  busPIRone  (BUSPAR ) 15 MG tablet TAKE ONE TABLET BY MOUTH TWICE DAILY FOR ANXIETY 11/20/23   Jolinda Potter M, DO  Calcium  Carb-Cholecalciferol  (CALCIUM  600+D3) 600-20 MG-MCG TABS Take 1 tablet by mouth daily.    [provider]  cariprazine  (VRAYLAR ) 1.5 MG capsule Take 1 capsule (1.5 mg total) by mouth daily. Patient taking differently: Take 1.5 mg by mouth at bedtime. 11/20/23   Jolinda Potter HERO, DO  carvedilol  (COREG ) 6.25 MG tablet Take 1 tablet (6.25 mg total) by mouth 2 (two) times daily with a meal. 11/20/23   Jolinda Potter M, DO  Cholecalciferol  (VITAMIN D3) 50 MCG (2000 UT) capsule Take 2,000 Units by mouth daily.    [provider]  citalopram  (CELEXA ) 40 MG tablet Take 1 tablet (40 mg total) by mouth daily. 08/20/23   Jolinda Potter HERO, DO  fexofenadine (ALLEGRA) 180 MG tablet Take 180 mg by mouth daily.    [provider]  fluticasone  (FLONASE ) 50 MCG/ACT nasal spray SHAKE LIQUID AND USE 2 SPRAYS IN EACH NOSTRIL DAILY 01/03/24   Jolinda Potter M, DO  losartan  (COZAAR ) 100 MG tablet Take 1 tablet (100 mg total) by mouth daily. 11/20/23   Jolinda Potter HERO, DO  Misc Natural Products (COLON CLEANSE PO) Take 1 capsule by mouth at bedtime.    [provider]  pantoprazole  (PROTONIX ) 20 MG tablet TAKE  1 TABLET BY MOUTH DAILY FOR ACID REFLUX 11/20/23   Jolinda Potter M, DO  Polyethyl Glyc-Propyl Glyc PF (SYSTANE HYDRATION PF) 0.4-0.3 % SOLN Place 1 drop into both eyes daily as needed (Dry eye).    [provider]  predniSONE  (STERAPRED UNI-PAK 21 TAB) 10 MG (21) TBPK tablet As directed x 6 days 03/04/24   Jolinda Potter M, DO  pregabalin  (LYRICA ) 150 MG capsule Take 1 capsule (150 mg total) by mouth in the morning AND 2 capsules (300 mg total) every evening. 02/20/24   Jolinda Potter HERO, DO  rosuvastatin  (CRESTOR ) 5 MG tablet Take 1 tablet (5 mg total) by  mouth at bedtime. 11/20/23   Jolinda Potter HERO, DO    Allergies: Lipitor [atorvastatin ] and Naproxen     Review of Systems  Respiratory:  Positive for shortness of breath.   All other systems reviewed and are negative.   Updated Vital Signs BP 121/83   Pulse 69   Temp 98.1 F (36.7 C) (Oral)   Resp 18   Ht 5' 5 (1.651 m)   Wt 97.8 kg   LMP 05/25/2011   SpO2 93%   BMI 35.88 kg/m   Physical Exam Vitals and nursing note reviewed.   63 year old female, resting comfortably and in no acute distress. Vital signs are normal. Oxygen  saturation is 93%, which is normal.  Appearance is mildly cushingoid. Head is normocephalic and atraumatic. PERRLA, EOMI. Neck is nontender and supple without adenopathy. Lungs mild expiratory wheezes diffusely without rales or rhonchi. Chest is nontender. Heart has regular rate and rhythm without murmur. Abdomen is soft, flat, nontender. Extremities have no cyanosis or edema. Skin is warm and dry without rash. Neurologic: Mental status is normal, cranial nerves are intact, moves all extremities equally.  (all labs ordered are listed, but only abnormal results are displayed) Labs Reviewed  RESP PANEL BY RT-PCR (RSV, FLU A&B, COVID)  [id number]    Radiology: DG Chest 2 View Result Date: 03/18/2024 EXAM: 2 VIEW(S) XRAY OF THE CHEST 03/18/2024 01:10:00 AM COMPARISON: PA and lateral chest 03/01/2024. CLINICAL HISTORY: shortness of breath. Worsening symptoms: shortness of breath, coughing is painful, chest tightness, LT shoulder pain that radiates to the LT side of back region shortness of breath. Worsening symptoms: shortness of breath, coughing is painful, chest tightness, LT shoulder pain that radiates to the LT side of back region FINDINGS: LUNGS AND PLEURA: The lungs are mildly emphysematous. No focal pulmonary opacity. No pulmonary edema. No pleural effusion. No pneumothorax. HEART AND MEDIASTINUM: The cardiomediastinal silhouette and vascular pattern are  normal. There is calcification of the transverse aorta. BONES AND SOFT TISSUES: There is osteopenia, degenerative change, and mild kyphosis of the thoracic spine. There is C5-C6 ACDF plating chronically noted. IMPRESSION: 1. No acute cardiopulmonary findings. 2. Mild emphysema. 3. Aortic atherosclerosis Electronically signed by: Francis Quam MD 03/18/2024 01:24 AM EDT RP Workstation: [id number]     Procedures   Medications Ordered in the ED  ipratropium-albuterol  (DUONEB) 0.5-2.5 (3) MG/3ML nebulizer solution 3 mL (3 mLs Nebulization Given 03/18/24 0118)  predniSONE  (DELTASONE ) tablet 60 mg (60 mg Oral Given 03/18/24 0111)  ipratropium-albuterol  (DUONEB) 0.5-2.5 (3) MG/3ML nebulizer solution 3 mL (3 mLs Nebulization Given 03/18/24 0231)                                    Medical Decision Making Amount and/or Complexity of Data Reviewed Radiology: ordered.  Risk  Prescription drug management.   COPD exacerbation.  Rule out pneumonia.  I have ordered a chest x-ray, nebulizer treatment with albuterol  and ipratropium, oral prednisone .  I have reviewed her past records, and note ED visit on 03/01/2024 for COPD exacerbation but I see no hospital admissions for COPD.  Chest x-ray shows no evidence of pneumonia.  I have independently viewed the images, and agree with radiologist interpretation.  I have reviewed her laboratory tests, my interpretation is negative PCR for COVID-19, influenza, RSV.  She feels significantly better following albuterol  and ipratropium via nebulizer, but she still had mild wheezing.  Added a second nebulizer treatment with no significant change.  However, her oxygen  saturations have improved and she is feeling better.  I feel she is safe for discharge even though she has slight persistent wheezing.  I am discharging her with a prednisone  taper and I have asked her to follow-up with her primary care provider at about the time the steroid taper is completed.  She also will  need to follow-up with pulmonology.     Final diagnoses:  COPD exacerbation [facility])    ED Discharge Orders          Ordered    predniSONE  (STERAPRED UNI-PAK 21 TAB) 10 MG (21) TBPK tablet        03/18/24 0241               Raford Lenis, MD 03/18/24 [phone]

## 2024-03-18 NOTE — ED Notes (Signed)
 Pt in x-ray at this time

## 2024-03-18 NOTE — ED Notes (Addendum)
 Jamie Mcgee

## 2024-03-19 ENCOUNTER — Ambulatory Visit

## 2024-03-20 ENCOUNTER — Encounter: Payer: Self-pay | Admitting: Obstetrics & Gynecology

## 2024-03-21 ENCOUNTER — Ambulatory Visit (HOSPITAL_COMMUNITY)

## 2024-03-23 ENCOUNTER — Encounter: Payer: Self-pay | Admitting: Internal Medicine

## 2024-03-24 NOTE — Telephone Encounter (Signed)
 I have seeing Dr A for f/u per Tammy Parrett's last ov   I don't agree with daily prednisone  when it can be avoided, and I only have a handful of pts who are managed this way but it is encouraging it does seem to make her feel better so rec  Ov with me asap with all meds in hand or if can't accommodate her this week see Dr Daria in next week since he is in [place] then

## 2024-03-27 ENCOUNTER — Ambulatory Visit: Admitting: Obstetrics & Gynecology

## 2024-03-27 ENCOUNTER — Other Ambulatory Visit (HOSPITAL_COMMUNITY)
Admission: RE | Admit: 2024-03-27 | Discharge: 2024-03-27 | Disposition: A | Discharge status: Home / Self Care | Source: Ambulatory Visit | Attending: Obstetrics & Gynecology | Admitting: Obstetrics & Gynecology

## 2024-03-27 ENCOUNTER — Encounter: Payer: Self-pay | Admitting: Obstetrics & Gynecology

## 2024-03-27 VITALS — BP 123/78 | HR 67 | Ht 64.0 in | Wt 200.4 lb

## 2024-03-27 DIAGNOSIS — Z9889 Other specified postprocedural states: Secondary | ICD-10-CM

## 2024-03-27 DIAGNOSIS — N898 Other specified noninflammatory disorders of vagina: Secondary | ICD-10-CM | POA: Insufficient documentation

## 2024-03-27 NOTE — Progress Notes (Signed)
    PostOp Visit Note  Jamie Mcgee is a 63 y.o. [phone] female who presents for a postoperative visit. She is 1 week postop following a LEEP completed on 03/12/2024   Today she notes she had a few days of moderate bleeding, but that has since improved.  She is still noting a black to green discharge and now reports slight odor.   Denies fever or chills.  Tolerating gen diet.  Otherwise reports no acute complaints   Review of Systems Pertinent items are noted in HPI.    Objective:  BP 123/78 (BP Location: Right Arm, Patient Position: Sitting, Cuff Size: Normal)   Pulse 67   Ht 5' 4 (1.626 m)   Wt 200 lb 6.4 oz (90.9 kg)   LMP 05/25/2011   BMI 34.40 kg/m    Physical Examination:  GENERAL ASSESSMENT: well developed and well nourished SKIN: Warm and dry CHEST: normal air exchange, respiratory effort normal with no retractions HEART: regular rate and rhythm GU: Normal external genitalia, normal vaginal mucosa, cervix still healing with black clot-like discharge noted. EXTREMITY: Minimal edema PSYCH: mood appropriate, normal affect       Assessment:    S/p LEEP  Vaginal odor/discharge  Plan:   -Reassured patient that these findings are likely due to recent surgery and reviewed that she is slow to heal - Vaginitis panel obtained to rule out underlying BV, further management pending results  Shadawn Hanaway, DO Attending Obstetrician & Gynecologist, [facility] [employer], [facility]

## 2024-03-31 LAB — CERVICOVAGINAL ANCILLARY ONLY
Bacterial Vaginitis (gardnerella): NEGATIVE
Candida Glabrata: NEGATIVE
Candida Vaginitis: NEGATIVE
Comment: NEGATIVE
Comment: NEGATIVE
Comment: NEGATIVE

## 2024-04-01 ENCOUNTER — Ambulatory Visit: Payer: Self-pay | Admitting: Obstetrics & Gynecology

## 2024-04-02 ENCOUNTER — Other Ambulatory Visit: Payer: Self-pay

## 2024-04-02 NOTE — Telephone Encounter (Signed)
 Ov with Dr wert nxt available or dr Catherine if there is any spots available tomorrow or Friday   Sending rx of preds

## 2024-04-03 NOTE — Telephone Encounter (Signed)
 Patient is now rescheduled to 11/4 to see Dr.Wert.

## 2024-04-07 ENCOUNTER — Ambulatory Visit (HOSPITAL_COMMUNITY)
Admission: RE | Admit: 2024-04-07 | Discharge: 2024-04-07 | Disposition: A | Discharge status: Home / Self Care | Source: Ambulatory Visit | Attending: Adult Health | Admitting: Adult Health

## 2024-04-07 DIAGNOSIS — Z1231 Encounter for screening mammogram for malignant neoplasm of breast: Secondary | ICD-10-CM | POA: Diagnosis present

## 2024-04-08 ENCOUNTER — Encounter: Payer: Self-pay | Admitting: Internal Medicine

## 2024-04-08 ENCOUNTER — Ambulatory Visit: Admitting: Internal Medicine

## 2024-04-08 VITALS — BP 107/66 | HR 81 | Ht 64.0 in | Wt 199.6 lb

## 2024-04-08 DIAGNOSIS — J431 Panlobular emphysema: Secondary | ICD-10-CM | POA: Diagnosis not present

## 2024-04-08 DIAGNOSIS — J432 Centrilobular emphysema: Secondary | ICD-10-CM

## 2024-04-08 DIAGNOSIS — F17210 Nicotine dependence, cigarettes, uncomplicated: Secondary | ICD-10-CM | POA: Diagnosis not present

## 2024-04-08 DIAGNOSIS — J441 Chronic obstructive pulmonary disease with (acute) exacerbation: Secondary | ICD-10-CM

## 2024-04-08 MED ORDER — BREZTRI AEROSPHERE 160-9-4.8 MCG/ACT IN AERO: 2.0000 | INHALATION_SPRAY | Freq: Two times a day (BID) | RESPIRATORY_TRACT | Status: AC

## 2024-04-08 NOTE — Assessment & Plan Note (Addendum)
 Active smoker Spirometry 07/12/2017  FEV1 1.01 (38%)  Ratio 65 with mod curvature   - 07/12/2017  After extensive coaching inhaler device  effectiveness =  75% > try dulera  100 2bid and off acei  - PFT's  12/28/2017  FEV1 1.41  (54 % ) ratio 74  p 0 % improvement from saba p nothing prior to study with DLCO  80 % corrects to 100 % for alv volume  And  ERV 15%  - PFT's  03/08/21   FEV1 1.37 (54 % ) ratio 0.75  p 0 % improvement from saba p breztri   prior to study with DLCO  11.5 (56%)   and FV curve min concave  and ERV 14% at wt 209     - 04/08/2024  After extensive coaching inhaler device,  effectiveness =    50% from baseline < 25% (short Ti)  >>> keep trying breztri  as she does not really have bad copd and once masters hfa may be able to just change over to symbicort 80 on return in March with pfts/ldsct in meantime         Each maintenance medication was reviewed in detail including emphasizing most importantly the difference between maintenance and prns and under what circumstances the prns are to be triggered using an action plan format where appropriate.  Total time for H and P, chart review, counseling, reviewing hfa device(s) and generating customized AVS unique to this office visit / same day charting = 35 min with pt new to me

## 2024-04-08 NOTE — Assessment & Plan Note (Addendum)
 5  min discussion re active cigarette smoking in addition to office E&M  Ask about tobacco use:  ongoing  Advise quitting  I took an extended  opportunity with this patient to outline the consequences of continued cigarette use  in airway disorders based on all the data we have from the multiple national lung health studies (perfomed over decades at millions of dollars in cost)  indicating that smoking cessation, not choice of inhalers or pulmonary physicians, is the most important aspect of her  care.   Assess willingness:  Not committed at this point Assist in quit attempt:  Per PCP when ready Arrange follow up:   Follow up per Primary Care planned

## 2024-04-08 NOTE — Progress Notes (Signed)
 Jamie Mcgee, female    DOB: 1960/10/08    MRN: [id number]   Brief patient profile:  87   yowf  active smoker (as is her roommate)  former [facility] patient with OSA and emphysema/MPNS   self-referred back to pulmonary clinic in [place]  04/08/2024  for COPD/ GOLD 0 / group E     TEST/EVENTS :  LDCT chest 07/2023 Emphysema, stable right lung nodules, RAD2  LDCT chest 06/2022 RADS 2, no new nodules LDCT chest 11/2020 paraseptal emphysema, right upper lobe 4 mm nodule, nodular liver   03/2021 PFTs show moderate restriction, ratio 0.75, FEV1 1.37/54%, FVC 56%, DLCO 11.5/56%  History of Present Illness  04/08/2024  Pulmonary/ 1st office eval/ Jamie Mcgee / [facility]  Chief Complaint  Patient presents with   COPD    TOC Coughing / shob   Dyspnea:  does shop at [employer] leaning on cart  but it's a struggle to finish a [employer] Cough: 1st thing in AM  lots of congestion = white x 20-30 min / uses breztri  but only helps for 4 hours then back to using saba hfa (note hfa technique < 50% effective due to short Ti)  Sleep: on side,  flat bed / one pillow wakes up freq with coughing  SABA use: 2-3  x day usually p exertion  02 ldz:wnwz     No obvious day to day or daytime pattern/variability or assoc excess/ purulent sputum or mucus plugs or hemoptysis or cp or chest tightness, subjective wheeze or overt   hb symptoms.    Also denies any obvious fluctuation of symptoms with weather or environmental changes or other aggravating or alleviating factors except as outlined above   No unusual exposure hx or h/o childhood pna/ asthma or knowledge of premature birth.  Current Allergies, Complete Past Medical History, Past Surgical History, Family History, and Social History were reviewed in [employer] record.  ROS  The following are not active complaints unless bolded Hoarseness, sore throat, dysphagia, dental problems, itching, sneezing,  nasal congestion or discharge of  excess mucus or purulent secretions, ear ache,   fever, chills, sweats, unintended wt loss or wt gain, classically pleuritic or exertional cp,  orthopnea pnd or arm/hand swelling  or leg swelling, presyncope, palpitations, abdominal pain, anorexia, nausea, vomiting, diarrhea  or change in bowel habits or change in bladder habits, change in stools or change in urine, dysuria, hematuria,  rash, arthralgias, visual complaints, headache, numbness, weakness or ataxia or problems with walking or coordination,  change in mood or  memory.            Outpatient Medications Prior to Visit  Medication Sig Dispense Refill   albuterol  (PROVENTIL ) (2.5 MG/3ML) 0.083% nebulizer solution Take 3 mLs (2.5 mg total) by nebulization every 6 (six) hours as needed for wheezing or shortness of breath. 75 mL 5   albuterol  (VENTOLIN  HFA) 108 (90 Base) MCG/ACT inhaler INHALE 2 PUFFS INTO THE LUNGS EVERY 6 HOURS AS NEEDED FOR WHEEZING OR SHORTNESS OF BREATH 18 g 5   amLODipine  (NORVASC ) 10 MG tablet Take 1 tablet (10 mg total) by mouth daily. 30 tablet 6   Budeson-Glycopyrrol-Formoterol  (BREZTRI  AEROSPHERE) 160-9-4.8 MCG/ACT AERO USE 2 INHALATIONS BY MOUTH TWICE DAILY 32.1 g 7   busPIRone  (BUSPAR ) 15 MG tablet TAKE ONE TABLET BY MOUTH TWICE DAILY FOR ANXIETY 90 tablet 1   Calcium  Carb-Cholecalciferol  (CALCIUM  600+D3) 600-20 MG-MCG TABS Take 1 tablet by mouth daily.     cariprazine  (  VRAYLAR ) 1.5 MG capsule Take 1 capsule (1.5 mg total) by mouth daily. 90 capsule 3   carvedilol  (COREG ) 6.25 MG tablet Take 1 tablet (6.25 mg total) by mouth 2 (two) times daily with a meal. 200 tablet 1   Cholecalciferol  (VITAMIN D3) 50 MCG (2000 UT) capsule Take 2,000 Units by mouth daily.     citalopram  (CELEXA ) 40 MG tablet Take 1 tablet (40 mg total) by mouth daily. 90 tablet 3   fexofenadine (ALLEGRA) 180 MG tablet Take 180 mg by mouth daily.     fluticasone  (FLONASE ) 50 MCG/ACT nasal spray SHAKE LIQUID AND USE 2 SPRAYS IN EACH NOSTRIL DAILY  16 g 6   losartan  (COZAAR ) 100 MG tablet Take 1 tablet (100 mg total) by mouth daily. 100 tablet 1   Misc Natural Products (COLON CLEANSE PO) Take 1 capsule by mouth at bedtime.     pantoprazole  (PROTONIX ) 20 MG tablet TAKE 1 TABLET BY MOUTH DAILY FOR ACID REFLUX 100 tablet 1   Polyethyl Glyc-Propyl Glyc PF (SYSTANE HYDRATION PF) 0.4-0.3 % SOLN Place 1 drop into both eyes daily as needed (Dry eye).     pregabalin  (LYRICA ) 150 MG capsule Take 1 capsule (150 mg total) by mouth in the morning AND 2 capsules (300 mg total) every evening. 270 capsule 0   rosuvastatin  (CRESTOR ) 5 MG tablet Take 1 tablet (5 mg total) by mouth at bedtime. 100 tablet 1   predniSONE  (STERAPRED UNI-PAK 21 TAB) 10 MG (21) TBPK tablet As directed x 6 days (Patient not taking: Reported on 04/08/2024) 21 tablet 0   No facility-administered medications prior to visit.    Past Medical History:  Diagnosis Date   Allergy    Anxiety    Arthritis    Binge eating disorder    Carpal tunnel syndrome on right    Cataract    Both lenses been replaced   Cervical stenosis of spine    C5-6   COPD (chronic obstructive pulmonary disease) (HCC)    Gold III   Depression    Emphysema of lung (HCC)    Emphysema, unspecified (HCC)    Encounter for screening fecal occult blood testing 11/29/2022   Fibromuscular dysplasia of renal artery 2006   GERD (gastroesophageal reflux disease)    Headache    migraines 20 years ago   Hepatitis    type A in 1977   HNP (herniated nucleus pulposus), cervical    HPV (human papilloma virus) infection    HTN (hypertension)    Neuropathy    Sleep apnea    Vaginal Pap smear, abnormal    Vitamin D  deficiency       Objective:     BP 107/66   Pulse 81   Ht 5' 4 (1.626 m)   Wt 199 lb 9.6 oz (90.5 kg)   LMP 05/25/2011   SpO2 95% Comment: ra  BMI 34.26 kg/m   SpO2: 95 % (ra) Mod obese (by bmi) pleasant wf / 3 bottom teeth   HEENT : Oropharynx   clear  Nasal turbinates nl    NECK :   without  apparent JVD/ palpable Nodes/TM    LUNGS: no acc muscle use,  Min barrel  contour chest wall with bilateral  Distant exp  wheeze and  without cough on insp or exp maneuvers  and mild  Hyperresonant  to  percussion bilaterally     CV:  RRR  no s3 or murmur or increase in P2, and no edema  ABD:  obese soft and nontender   MS:  Nl gait/ ext warm without deformities Or obvious joint restrictions  calf tenderness, cyanosis or clubbing     SKIN: warm and dry without lesions    NEURO:  alert, approp, nl sensorium with  no motor or cerebellar deficits apparent.        Assessment     Assessment & Plan COPD with acute exacerbation (HCC)  Centrilobular emphysema (HCC)  Panlobular emphysema (HCC) Active smoker Spirometry 07/12/2017  FEV1 1.01 (38%)  Ratio 65 with mod curvature   - 07/12/2017  After extensive coaching inhaler device  effectiveness =  75% > try dulera  100 2bid and off acei  - PFT's  12/28/2017  FEV1 1.41  (54 % ) ratio 74  p 0 % improvement from saba p nothing prior to study with DLCO  80 % corrects to 100 % for alv volume  And  ERV 15%  - PFT's  03/08/21   FEV1 1.37 (54 % ) ratio 0.75  p 0 % improvement from saba p breztri   prior to study with DLCO  11.5 (56%)   and FV curve min concave  and ERV 14% at wt 209     - 04/08/2024  After extensive coaching inhaler device,  effectiveness =    50% from baseline < 25% (short Ti)  >>> keep trying breztri  as she does not really have bad copd and once masters hfa may be able to just change over to symbicort 80 on return in March with pfts/ldsct in meantime         Each maintenance medication was reviewed in detail including emphasizing most importantly the difference between maintenance and prns and under what circumstances the prns are to be triggered using an action plan format where appropriate.  Total time for H and P, chart review, counseling, reviewing hfa device(s) and generating customized AVS unique to this office visit  / same day charting = 35 min with pt new to me        Cigarette smoker 5 min discussion re active cigarette smoking in addition to office E&M  Ask about tobacco use:   ongoing  Advise quitting   I took an extended  opportunity with this patient to outline the consequences of continued cigarette use  in airway disorders based on all the data we have from the multiple national lung health studies (perfomed over decades at millions of dollars in cost)  indicating that smoking cessation, not choice of inhalers or pulmonary physicians, is the most important aspect of her  care.   Assess willingness:  Not committed at this point Assist in quit attempt:  Per PCP when ready Arrange follow up:   Follow up per Primary Care planned         AVS  Patient Instructions  Make sure you check your oxygen  saturation  AT  your highest level of activity (not after you stop)   to be sure it stays over 90% and adjust  02 flow upward to maintain this level if needed but remember to turn it back to previous settings when you stop (to conserve your supply).    Plan A = Automatic = Always=    Breztri  Take 2 puffs first thing in am and then another 2 puffs about 12 hours later.     Work on inhaler technique:  relax and gently blow all the way out then take a nice smooth full deep breath back in, triggering the  inhaler at same time you start breathing in.  Hold breath in for at least  5 seconds if you can. Blow out Breztri  thru nose. Rinse and gargle with water when done.  If mouth or throat bother you at all,  try brushing teeth/gums/tongue with arm and hammer toothpaste/ make a slurry and gargle and spit out.   >>>  Remember how golfers warm up by taking practice swings - do this with an empty inhaler    Plan B = Backup (to supplement plan A, not to replace it) Use your albuterol  inhaler as a rescue medication to be used if you can't catch your breath by resting or slowing your pace  or doing a relaxed purse lip  breathing pattern.  - The less you use it, the better it will work when you need it. - Ok to use the inhaler up to 2 puffs  every 4 hours if you must but call for appointment if use goes up over your usual need - Don't leave home without it !!  (think of it like the spare tire or starter fluid for your car)   Plan C = Crisis (instead of Plan B but only if Plan B stops working) - only use your albuterol  nebulizer if you first try Plan B and it fails to help > ok to use the nebulizer up to every 4 hours but if start needing it regularly call for immediate appointment   The key is to stop smoking completely before smoking completely stops you!   Follow up in March  2026  - call sooner if needed          .   Ozell America, MD 04/08/2024

## 2024-04-08 NOTE — Patient Instructions (Addendum)
 Make sure you check your oxygen  saturation  AT  your highest level of activity (not after you stop)   to be sure it stays over 90% and adjust  02 flow upward to maintain this level if needed but remember to turn it back to previous settings when you stop (to conserve your supply).    Plan A = Automatic = Always=    Breztri  Take 2 puffs first thing in am and then another 2 puffs about 12 hours later.     Work on inhaler technique:  relax and gently blow all the way out then take a nice smooth full deep breath back in, triggering the inhaler at same time you start breathing in.  Hold breath in for at least  5 seconds if you can. Blow out Breztri  thru nose. Rinse and gargle with water when done.  If mouth or throat bother you at all,  try brushing teeth/gums/tongue with arm and hammer toothpaste/ make a slurry and gargle and spit out.   >>>  Remember how golfers warm up by taking practice swings - do this with an empty inhaler    Plan B = Backup (to supplement plan A, not to replace it) Use your albuterol  inhaler as a rescue medication to be used if you can't catch your breath by resting or slowing your pace  or doing a relaxed purse lip breathing pattern.  - The less you use it, the better it will work when you need it. - Ok to use the inhaler up to 2 puffs  every 4 hours if you must but call for appointment if use goes up over your usual need - Don't leave home without it !!  (think of it like the spare tire or starter fluid for your car)   Plan C = Crisis (instead of Plan B but only if Plan B stops working) - only use your albuterol  nebulizer if you first try Plan B and it fails to help > ok to use the nebulizer up to every 4 hours but if start needing it regularly call for immediate appointment   The key is to stop smoking completely before smoking completely stops you!   Follow up in March  2026  - call sooner if needed          .

## 2024-04-09 ENCOUNTER — Ambulatory Visit: Payer: Self-pay | Admitting: Adult Health

## 2024-04-23 ENCOUNTER — Other Ambulatory Visit: Payer: Self-pay

## 2024-04-23 ENCOUNTER — Other Ambulatory Visit: Payer: Self-pay | Admitting: Family Medicine

## 2024-04-23 ENCOUNTER — Encounter: Admitting: Internal Medicine

## 2024-04-23 DIAGNOSIS — G8929 Other chronic pain: Secondary | ICD-10-CM

## 2024-04-23 DIAGNOSIS — F4323 Adjustment disorder with mixed anxiety and depressed mood: Secondary | ICD-10-CM

## 2024-04-23 DIAGNOSIS — E871 Hypo-osmolality and hyponatremia: Secondary | ICD-10-CM

## 2024-04-23 DIAGNOSIS — I10 Essential (primary) hypertension: Secondary | ICD-10-CM

## 2024-04-23 DIAGNOSIS — E7800 Pure hypercholesterolemia, unspecified: Secondary | ICD-10-CM

## 2024-04-23 NOTE — Telephone Encounter (Signed)
 [facility] NTBS in Dec for 3 mos FU RFs except Pregabalin  sent to pharmacy

## 2024-04-23 NOTE — Telephone Encounter (Signed)
 Appt made 06-10-2024 with Dr. KANDICE

## 2024-04-25 MED ORDER — AMLODIPINE BESYLATE 10 MG PO TABS: 10.0000 mg | ORAL_TABLET | Freq: Every day | ORAL | 3 refills | Status: AC

## 2024-05-02 ENCOUNTER — Other Ambulatory Visit: Payer: Self-pay | Admitting: Family Medicine

## 2024-05-02 DIAGNOSIS — J329 Chronic sinusitis, unspecified: Secondary | ICD-10-CM

## 2024-05-14 ENCOUNTER — Other Ambulatory Visit: Payer: Self-pay

## 2024-05-14 MED ORDER — ALBUTEROL SULFATE HFA 108 (90 BASE) MCG/ACT IN AERS: 2.0000 | INHALATION_SPRAY | Freq: Four times a day (QID) | RESPIRATORY_TRACT | 5 refills | Status: AC | PRN

## 2024-05-14 MED ORDER — ALBUTEROL SULFATE (2.5 MG/3ML) 0.083% IN NEBU: 2.5000 mg | INHALATION_SOLUTION | Freq: Four times a day (QID) | RESPIRATORY_TRACT | 5 refills | Status: AC | PRN

## 2024-06-04 ENCOUNTER — Encounter: Payer: Self-pay | Admitting: Gastroenterology

## 2024-06-05 ENCOUNTER — Encounter: Payer: Self-pay | Admitting: Gastroenterology

## 2024-06-10 ENCOUNTER — Ambulatory Visit (INDEPENDENT_AMBULATORY_CARE_PROVIDER_SITE_OTHER): Admitting: Family Medicine

## 2024-06-10 ENCOUNTER — Encounter: Payer: Self-pay | Admitting: Family Medicine

## 2024-06-10 VITALS — BP 127/84 | HR 70 | Temp 97.5°F | Ht 64.0 in | Wt 191.1 lb

## 2024-06-10 DIAGNOSIS — M5442 Lumbago with sciatica, left side: Secondary | ICD-10-CM

## 2024-06-10 DIAGNOSIS — M5441 Lumbago with sciatica, right side: Secondary | ICD-10-CM

## 2024-06-10 DIAGNOSIS — G8929 Other chronic pain: Secondary | ICD-10-CM | POA: Diagnosis not present

## 2024-06-10 DIAGNOSIS — Z720 Tobacco use: Secondary | ICD-10-CM

## 2024-06-10 DIAGNOSIS — F4323 Adjustment disorder with mixed anxiety and depressed mood: Secondary | ICD-10-CM | POA: Diagnosis not present

## 2024-06-10 DIAGNOSIS — L409 Psoriasis, unspecified: Secondary | ICD-10-CM

## 2024-06-10 MED ORDER — CLOBETASOL PROPIONATE 0.05 % EX SOLN: 1.0000 | Freq: Two times a day (BID) | CUTANEOUS | 1 refills | Status: AC | PRN

## 2024-06-10 MED ORDER — PREGABALIN 150 MG PO CAPS: ORAL_CAPSULE | ORAL | 1 refills | Status: AC

## 2024-06-10 MED ORDER — BUPROPION HCL ER (XL) 150 MG PO TB24: 150.0000 mg | ORAL_TABLET | Freq: Every day | ORAL | 3 refills | Status: AC

## 2024-06-10 NOTE — Patient Instructions (Signed)
 Psoriasis Psoriasis is a long-term (chronic) skin condition. It causes raised, red patches (plaques) that look silvery on your skin. The patches may be on all areas of your body and can be any size or shape. Symptoms can range from mild to very bad. This condition cannot be passed from one person to another (is not contagious). What are the causes? The exact cause of psoriasis is not known. It occurs because the body's defense system (immune system) attacks healthy skin. This causes the patches. Some things can make the condition worse. These are: Skin damage, such as cuts, scrapes, sunburn, and dryness. Not getting enough sunlight. Some medicines. Alcohol. Tobacco. Stress. Infections. What increases the risk? You are more likely to develop this condition if you: Have a family member with psoriasis. Are very overweight (obese). Are 73-29 years old. Take certain medicines. What are the signs or symptoms? There are different types of psoriasis. You can have more than one type. The types are: Plaque. This is the most common. Symptoms include red, raised patches with a silvery coating. These may be itchy. Your nails may be crumbly or fall off. Guttate. Symptoms include small red spots on your stomach area, arms, and legs. These may happen after you have been sick, especially with strep throat. Inverse. Symptoms include patches in your armpits, under your breasts, private areas, or on your butt. Pustular. Symptoms include swollen, red, pus-filled bumps that hurt on the palms of your hands or the soles of your feet. You also may feel very tired, weak, have a fever, and not be hungry. Erythrodermic. Symptoms include bright red skin that looks burned. You may have a fast heartbeat and a body temperature that is too high or too low. You may be itchy or in pain. Sebopsoriasis. Symptoms include red patches on your scalp, forehead, and face that are greasy. Psoriatic arthritis. Symptoms include  swollen, painful joints along with scaly skin patches. Your nails may be crumbly or fall off. There are times when symptoms may get worse (flares). How is this treated? There is no cure for this condition, but treatment can: Help your skin heal. Help with itching. Help with irritation and swelling (inflammation). Slow the growth of new skin cells. Help your body's defense system respond better to your skin. Help with any related conditions. Psoriasis can increase your risk of other conditions, such as: Heart disease. High blood pressure. Eye problems. Depression. Treatment may include: Creams or ointments. Light therapy. This may include natural sunlight or light therapy in a doctor's office. Medicines. These can help your body better manage skin cells. They may be used with light therapy or ointments. Medicines may include pills or injections. You may also get antibiotic medicines if you have an infection. These may include: Systemic therapy medicines. These medicines: Can help your body manage how the skin cells grow. Can help with irritation and swelling. May be given as pills or injections. Biologic medicines. These medicines: Are usually given through an IV or as injections. Are helpful for people with very bad symptoms. Have a higher risk of infection. Follow these instructions at home: Skin Care Use lotion on your skin as needed. Only use lotions that your doctor has said are okay. Put cool, wet cloths (cold compresses) on the affected areas. Do not use a hot tub or take hot showers. Use slightly warm water when taking showers and baths. Do not scratch your skin. Lifestyle Keep a healthy weight. Do not eat a lot of foods that have  a lot of solid fats, added sugars, or salt in them. Eat a healthy diet that includes lots of: Vegetables. Fruit. Whole grains. Low-fat dairy products. Lean proteins. Do not smoke or use any products that contain nicotine or tobacco. If you  need help quitting, ask your doctor. Lower your stress. Go out in the sun as told by your doctor. Do not get sunburned. Join a support group. General instructions  Take or use over-the-counter and prescription medicines only as told by your doctor. Track the things that cause symptoms (triggers). Try to avoid these things. Do not drink alcohol if your doctor tells you not to drink. See a counselor if you feel the support would help. Keep all follow-up visits. Your doctor will check on your condition to make sure it does not get worse or cause problems. Where to find support National Psoriasis Foundation: www.psoriasis.org Where to find more information American Academy of Dermatology: InfoExam.si Contact a doctor if: You have a fever or chills. Your signs or symptoms get worse. You have more redness or warmth in the affected areas. You have new or worse pain or stiffness in your joints. Your nails break easily or pull away from the nail bed. You feel very sad (depressed), frustrated, or hopeless. Get help right away if: You have thoughts of hurting yourself or others. Get help right away if you feel like you may hurt yourself or others, or have thoughts about taking your own life. Go to your nearest emergency room or: Call 911. Call the National Suicide Prevention Lifeline at 917 452 7600 or 988. This is open 24 hours a day. Text the Crisis Text Line at 325-590-0087. Summary Psoriasis is a long-term (chronic) skin condition. There is no cure for this condition, but treatment can help. Track the things that cause symptoms. Take or use over-the-counter and prescription medicines only as told by your doctor. Keep all follow-up visits. This information is not intended to replace advice given to you by your health care provider. Make sure you discuss any questions you have with your health care provider. Document Revised: 07/27/2021 Document Reviewed: 07/27/2021 Elsevier Patient Education   2024 [employer].

## 2024-06-10 NOTE — Addendum Note (Signed)
 Addended by: JOLINDA NORENE HERO on: 06/10/2024 03:47 PM   Modules accepted: Level of Service

## 2024-06-10 NOTE — Progress Notes (Signed)
 "  Subjective: CC: Checkup PCP: Jolinda Jamie HERO, DO YEP:Jamie Mcgee is a 64 y.o. female presenting to clinic today for:  Patient reports that she has been doing well with the adjusted dose of Lyrica .  No concerning excessive daytime sedation now.  She is compliant with Celexa  and Vraylar  but continues to smoke.  She is now down to less than a pack per day and sometimes down to half a pack per day but she really is motivated to try and come off cigarettes and wants a trial of Wellbutrin .  Tried it a couple of decades ago but thinks that she should give it another go.  She denies any hemoptysis.  She does report chest congestion that is relieved by DayQuil.  She would like to get her influenza vaccination today  Also reports a spot on the back of her hair that seem to get better while she was on prednisone  but has come back again.  It is very itchy.   ROS: Per HPI  Allergies[1] Past Medical History:  Diagnosis Date   Allergy    Anxiety    Arthritis    Binge eating disorder    Carpal tunnel syndrome on right    Cataract    Both lenses been replaced   Cervical stenosis of spine    C5-6   COPD (chronic obstructive pulmonary disease) (HCC)    Gold III   Depression    Emphysema of lung (HCC)    Emphysema, unspecified (HCC)    Encounter for screening fecal occult blood testing 11/29/2022   Fibromuscular dysplasia of renal artery 2006   GERD (gastroesophageal reflux disease)    Headache    migraines 20 years ago   Hepatitis    type A in 1977   HNP (herniated nucleus pulposus), cervical    HPV (human papilloma virus) infection    HTN (hypertension)    Neuropathy    Sleep apnea    Vaginal Pap smear, abnormal    Vitamin D  deficiency    Current Medications[2] Social History   Socioeconomic History   Marital status: Divorced    Spouse name: Not on file   Number of children: Not on file   Years of education: Not on file   Highest education level: GED or equivalent   Occupational History   Not on file  Tobacco Use   Smoking status: Every Day    Current packs/day: 0.00    Average packs/day: 1 pack/day for 40.0 years (40.0 ttl pk-yrs)    Types: Cigarettes    Start date: 11/01/1981    Last attempt to quit: 11/01/2021    Years since quitting: 2.6    Passive exposure: Current   Smokeless tobacco: Never   Tobacco comments:    Quit 11/15/21  Vaping Use   Vaping status: Former   Devices: hit a vape 3x a couple weeks ago with friends.  Substance and Sexual Activity   Alcohol use: No   Drug use: Yes    Frequency: 7.0 times per week    Types: Marijuana   Sexual activity: Not Currently    Birth control/protection: Abstinence, Post-menopausal    Comment: tubal  Other Topics Concern   Not on file  Social History Narrative   The patient does not have any sex drive and has not had one for almost a year.   Her   husband thinks she does not love him anymore.  He does not seem to understand   her  depression.   Social Drivers of Health   Tobacco Use: High Risk (04/08/2024)   Patient History    Smoking Tobacco Use: Every Day    Smokeless Tobacco Use: Never    Passive Exposure: Current  Financial Resource Strain: Medium Risk (06/09/2024)   Overall Financial Resource Strain (CARDIA)    Difficulty of Paying Living Expenses: Somewhat hard  Food Insecurity: Food Insecurity Present (06/09/2024)   Epic    Worried About Programme Researcher, Broadcasting/film/video in the Last Year: Sometimes true    Ran Out of Food in the Last Year: Sometimes true  Transportation Needs: Unknown (06/09/2024)   Epic    Lack of Transportation (Medical): No    Lack of Transportation (Non-Medical): Patient declined  Physical Activity: Inactive (06/09/2024)   Exercise Vital Sign    Days of Exercise per Week: 0 days    Minutes of Exercise per Session: Not on file  Stress: Stress Concern Present (06/09/2024)   [employer] of Occupational Health - Occupational Stress Questionnaire    Feeling of Stress:  Rather much  Social Connections: Socially Isolated (06/09/2024)   Social Connection and Isolation Panel    Frequency of Communication with Friends and Family: More than three times a week    Frequency of Social Gatherings with Friends and Family: Three times a week    Attends Religious Services: Never    Active Member of Clubs or Organizations: No    Attends Banker Meetings: Not on file    Marital Status: Divorced  Intimate Partner Violence: Not At Risk (12/03/2023)   Epic    Fear of Current or Ex-Partner: No    Emotionally Abused: No    Physically Abused: No    Sexually Abused: No  Depression (PHQ2-9): Medium Risk (02/20/2024)   Depression (PHQ2-9)    PHQ-2 Score: 6  Alcohol Screen: Low Risk (06/09/2024)   Alcohol Screen    Last Alcohol Screening Score (AUDIT): 0  Housing: Low Risk (06/09/2024)   Epic    Unable to Pay for Housing in the Last Year: No    Number of Times Moved in the Last Year: 0    Homeless in the Last Year: No  Utilities: Not At Risk (12/03/2023)   Epic    Threatened with loss of utilities: No  Health Literacy: Adequate Health Literacy (07/03/2023)   B1300 Health Literacy    Frequency of need for help with medical instructions: Never   Family History  Problem Relation Age of Onset   Cancer Mother        breast   Anxiety disorder Mother    Arthritis Mother    Depression Mother    Obesity Mother    Coronary artery disease Father        States congenital abnormality   Diabetes Father    Hyperlipidemia Father    Early death Father    CAD Sister        Stents    Cancer Sister        breast   Anxiety disorder Sister    Asthma Sister    Depression Sister    Obesity Sister    Cancer Maternal Grandmother        breast   Heart disease Maternal Grandfather    Cancer Maternal Aunt        breast   Anesthesia problems Neg Hx    Hypotension Neg Hx    Malignant hyperthermia Neg Hx    Pseudochol deficiency Neg Hx  Colon cancer Neg Hx    Liver  disease Neg Hx     Objective: Office vital signs reviewed. BP 127/84   Pulse 70   Temp (!) 97.5 F (36.4 C)   Ht 5' 4 (1.626 m)   Wt 191 lb 2 oz (86.7 kg)   LMP 03/25/2011   SpO2 92%   BMI 32.81 kg/m   Physical Examination:  General: Awake, alert, well nourished, No acute distress HEENT: Sclera white.  Moist mucous membranes.  She has a crusted, hyperkeratotic plaque noted at the base of the scalp. Cardio: regular rate and rhythm, S1S2 heard, no murmurs appreciated Pulm: Globally decreased breath sounds but clear to auscultation bilaterally, no wheezes, rhonchi or rales; normal work of breathing on room air MSK: Ambulating independently with normal gait and station     06/10/2024    2:30 PM 02/20/2024   11:43 AM 12/03/2023    9:38 AM  Depression screen PHQ 2/9  Decreased Interest 2 1 1   Down, Depressed, Hopeless 2 1 2   PHQ - 2 Score 4 2 3   Altered sleeping 2 1 2   Tired, decreased energy 3 1 3   Change in appetite 3 1 3   Feeling bad or failure about yourself  2 0 0  Trouble concentrating 3 1 1   Moving slowly or fidgety/restless 1 0 0  Suicidal thoughts 0 0 0  PHQ-9 Score 18 6  12    Difficult doing work/chores Very difficult Somewhat difficult      Data saved with a previous flowsheet row definition      06/10/2024    2:31 PM 02/20/2024   11:43 AM 12/03/2023    9:38 AM 11/20/2023    1:02 PM  GAD 7 : Generalized Anxiety Score  Nervous, Anxious, on Edge 3 1 2 2   Control/stop worrying 0 0 0 2  Worry too much - different things 0 0 0 0  Trouble relaxing 2 1 1 1   Restless 2 1 1 1   Easily annoyed or irritable 2 0 1 1  Afraid - awful might happen 1 0 0 0  Total GAD 7 Score 10 3 5 7   Anxiety Difficulty Very difficult Somewhat difficult  Somewhat difficult    Assessment/ Plan: 64 y.o. female   Chronic bilateral low back pain with bilateral sciatica - Plan: pregabalin  (LYRICA ) 150 MG capsule  Tobacco abuse - Plan: buPROPion  (WELLBUTRIN  XL) 150 MG 24 hr  tablet  Situational mixed anxiety and depressive disorder - Plan: buPROPion  (WELLBUTRIN  XL) 150 MG 24 hr tablet  Psoriasis of scalp - Plan: clobetasol  (TEMOVATE ) 0.05 % external solution   Continue Lyrica  as directed.  Up-to-date on UDS and CSC and the national narcotic database reviewed  Start Wellbutrin .  Hopefully this will aid in tobacco cessation as well as improve depressive symptoms as the PHQ score was elevated today despite treatment with Vraylar  and max dose SSRI  Lesion of concern seems consistent with psoriasis scalp we discussed that squamous cell carcinoma could also present this way.  Trial of clobetasol  but if no significant improvement over the next 4 weeks, low threshold to have her see dermatology  Jamie CHRISTELLA Fielding, DO [facility] [phone]     [1]  Allergies Allergen Reactions   Lipitor [Atorvastatin ] Other (See Comments)    Muscle aches and nausea   Naproxen  Nausea And Vomiting    Stomach pain Nsaid  [2]  Current Outpatient Medications:    albuterol  (PROVENTIL ) (2.5 MG/3ML) 0.083% nebulizer solution, Take  3 mLs (2.5 mg total) by nebulization every 6 (six) hours as needed for wheezing or shortness of breath., Disp: 75 mL, Rfl: 5   albuterol  (VENTOLIN  HFA) 108 (90 Base) MCG/ACT inhaler, Inhale 2 puffs into the lungs every 6 (six) hours as needed for wheezing or shortness of breath., Disp: 18 g, Rfl: 5   amLODipine  (NORVASC ) 10 MG tablet, Take 1 tablet (10 mg total) by mouth daily., Disp: 90 tablet, Rfl: 3   Budeson-Glycopyrrol-Formoterol  (BREZTRI  AEROSPHERE) 160-9-4.8 MCG/ACT AERO, USE 2 INHALATIONS BY MOUTH TWICE DAILY, Disp: 32.1 g, Rfl: 7   busPIRone  (BUSPAR ) 15 MG tablet, TAKE 1 TABLET BY MOUTH TWICE  DAILY FOR ANXIETY, Disp: 180 tablet, Rfl: 0   Calcium  Carb-Cholecalciferol  (CALCIUM  600+D3) 600-20 MG-MCG TABS, Take 1 tablet by mouth daily., Disp: , Rfl:    cariprazine  (VRAYLAR ) 1.5 MG capsule, Take 1 capsule (1.5 mg total) by  mouth daily., Disp: 90 capsule, Rfl: 3   carvedilol  (COREG ) 6.25 MG tablet, TAKE 1 TABLET BY MOUTH TWICE  DAILY WITH A MEAL, Disp: 200 tablet, Rfl: 0   Cholecalciferol  (VITAMIN D3) 50 MCG (2000 UT) capsule, Take 2,000 Units by mouth daily., Disp: , Rfl:    citalopram  (CELEXA ) 40 MG tablet, Take 1 tablet (40 mg total) by mouth daily., Disp: 90 tablet, Rfl: 3   fexofenadine (ALLEGRA) 180 MG tablet, Take 180 mg by mouth daily., Disp: , Rfl:    fluticasone  (FLONASE ) 50 MCG/ACT nasal spray, SHAKE LIQUID AND USE 2 SPRAYS IN EACH NOSTRIL DAILY, Disp: 16 g, Rfl: 6   losartan  (COZAAR ) 100 MG tablet, TAKE 1 TABLET BY MOUTH DAILY, Disp: 100 tablet, Rfl: 0   Misc Natural Products (COLON CLEANSE PO), Take 1 capsule by mouth at bedtime., Disp: , Rfl:    pantoprazole  (PROTONIX ) 20 MG tablet, TAKE 1 TABLET BY MOUTH DAILY FOR ACID REFLUX, Disp: 100 tablet, Rfl: 0   Polyethyl Glyc-Propyl Glyc PF (SYSTANE HYDRATION PF) 0.4-0.3 % SOLN, Place 1 drop into both eyes daily as needed (Dry eye)., Disp: , Rfl:    pregabalin  (LYRICA ) 150 MG capsule, Take 1 capsule (150 mg total) by mouth in the morning AND 2 capsules (300 mg total) every evening., Disp: 270 capsule, Rfl: 0   rosuvastatin  (CRESTOR ) 5 MG tablet, TAKE 1 TABLET BY MOUTH AT  BEDTIME, Disp: 100 tablet, Rfl: 0  "

## 2024-06-13 ENCOUNTER — Telehealth: Payer: Self-pay | Admitting: Internal Medicine

## 2024-06-13 ENCOUNTER — Encounter: Payer: Self-pay | Admitting: Internal Medicine

## 2024-06-13 ENCOUNTER — Encounter: Payer: Self-pay | Admitting: *Deleted

## 2024-06-13 ENCOUNTER — Other Ambulatory Visit: Payer: Self-pay | Admitting: *Deleted

## 2024-06-13 DIAGNOSIS — Z122 Encounter for screening for malignant neoplasm of respiratory organs: Secondary | ICD-10-CM

## 2024-06-13 DIAGNOSIS — Z87891 Personal history of nicotine dependence: Secondary | ICD-10-CM

## 2024-06-13 DIAGNOSIS — F17210 Nicotine dependence, cigarettes, uncomplicated: Secondary | ICD-10-CM

## 2024-06-13 NOTE — Telephone Encounter (Signed)
 Copied from CRM [id number]. Topic: Clinical - Request for Lab/Test Order >> Jun 09, 2024 12:51 PM Corean SAUNDERS wrote: Reason for CRM: Patient is requesting Dr. Darlean to please place an order for a PFT as Tammy Parrett was supposed to do it before leaving clinic.  See phone note

## 2024-06-13 NOTE — Telephone Encounter (Signed)
 Spoke with patient regarding the Tuesday 07/29/24 9:00 am PFT appointment at [facility] time is 8:45 am---1st floor registration desk for check in---follow up with Dr. Darlean is scheduled  Monday 08/18/24 at 8:45 am in the [place] office---will mail information to patient and she voiced her understanding

## 2024-07-01 ENCOUNTER — Ambulatory Visit

## 2024-07-01 VITALS — Ht 64.0 in | Wt 191.0 lb

## 2024-07-01 DIAGNOSIS — Z0000 Encounter for general adult medical examination without abnormal findings: Secondary | ICD-10-CM

## 2024-07-01 NOTE — Patient Instructions (Signed)
 Jamie Mcgee,  Thank you for taking the time for your Medicare Wellness Visit. I appreciate your continued commitment to your health goals. Please review the care plan we discussed, and feel free to reach out if I can assist you further.  Please note that Annual Wellness Visits do not include a physical exam. Some assessments may be limited, especially if the visit was conducted virtually. If needed, we may recommend an in-person follow-up with your provider.  Ongoing Care Seeing your primary care provider every 3 to 6 months helps us  monitor your health and provide consistent, personalized care.   Referrals If a referral was made during today's visit and you haven't received any updates within two weeks, please contact the referred provider directly to check on the status.  Recommended Screenings:  Health Maintenance  Topic Date Due   COVID-19 Vaccine (2 - Moderna risk series) 11/03/2019   Screening for Lung Cancer  07/30/2024   Hepatitis B Vaccine (1 of 3 - Risk 3-dose series) 06/10/2025*   Breast Cancer Screening  04/07/2025   Medicare Annual Wellness Visit  07/01/2025   Osteoporosis screening with Bone Density Scan  02/21/2026   DTaP/Tdap/Td vaccine (2 - Td or Tdap) 01/05/2028   Pap with HPV screening  12/02/2028   Colon Cancer Screening  07/06/2030   Pneumococcal Vaccine for age over 55  Completed   Flu Shot  Completed   HPV Vaccine (No Doses Required) Completed   Hepatitis C Screening  Completed   HIV Screening  Completed   Zoster (Shingles) Vaccine  Completed   Meningitis B Vaccine  Aged Out  *Topic was postponed. The date shown is not the original due date.       06/27/2024    9:01 AM  Advanced Directives  Does Patient Have a Medical Advance Directive? No  Would patient like information on creating a medical advance directive? Yes (MAU/Ambulatory/Procedural Areas - Information given)   Information on Advanced Care Planning can be found at [place]  Secretary of  [facility] Advance Health Care Directives Advance Health Care Directives ([email or web address]   Vision: Annual vision screenings are recommended for early detection of glaucoma, cataracts, and diabetic retinopathy. These exams can also reveal signs of chronic conditions such as diabetes and high blood pressure.  Dental: Annual dental screenings help detect early signs of oral cancer, gum disease, and other conditions linked to overall health, including heart disease and diabetes.  Please see the attached documents for additional preventive care recommendations.

## 2024-07-01 NOTE — Progress Notes (Cosign Needed Addendum)
 "  Chief Complaint  Patient presents with   Medicare Wellness     Subjective:   Jamie Mcgee is a 64 y.o. female who presents for a Medicare Annual Wellness Visit.  Visit info / Clinical Intake: Medicare Wellness Visit Type:: Subsequent Annual Wellness Visit Persons participating in visit and providing information:: patient Medicare Wellness Visit Mode:: Telephone If telephone:: video declined Since this visit was completed virtually, some vitals may be partially provided or unavailable. Missing vitals are due to the limitations of the virtual format.: Documented vitals are patient reported If Telephone or Video please confirm:: I connected with patient using audio/video enable telemedicine. I verified patient identity with two identifiers, discussed telehealth limitations, and patient agreed to proceed. Patient Location:: home Provider Location:: home office Interpreter Needed?: No Pre-visit prep was completed: yes AWV questionnaire completed by patient prior to visit?: yes Date:: 06/27/24 Living arrangements:: (Patient-Rptd) with family/others Patient's Overall Health Status Rating: (Patient-Rptd) good Typical amount of pain: (!) (Patient-Rptd) a lot Does pain affect daily life?: (!) (Patient-Rptd) yes Are you currently prescribed opioids?: no  Dietary Habits and Nutritional Risks How many meals a day?: (Patient-Rptd) 2 Eats fruit and vegetables daily?: (!) (Patient-Rptd) no Most meals are obtained by: (Patient-Rptd) preparing own meals In the last 2 weeks, have you had any of the following?: none Diabetic:: no  Functional Status Activities of Daily Living (to include ambulation/medication): (Patient-Rptd) Independent Ambulation: (Patient-Rptd) Independent Medication Administration: (Patient-Rptd) Independent Home Management (perform basic housework or laundry): (Patient-Rptd) Independent Manage your own finances?: (Patient-Rptd) yes Primary transportation is:  (Patient-Rptd) family / friends Concerns about vision?: no *vision screening is required for WTM* Concerns about hearing?: no  Fall Screening Falls in the past year?: (Patient-Rptd) 0 Number of falls in past year: 0 Was there an injury with Fall?: 0 Fall Risk Category Calculator: 0 Patient Fall Risk Level: Low Fall Risk  Fall Risk Patient at Risk for Falls Due to: No Fall Risks Fall risk Follow up: Falls evaluation completed; Education provided; Falls prevention discussed  Home and Transportation Safety: All rugs have non-skid backing?: (Patient-Rptd) yes All stairs or steps have railings?: (Patient-Rptd) N/A, no stairs Grab bars in the bathtub or shower?: (!) (Patient-Rptd) no Have non-skid surface in bathtub or shower?: (Patient-Rptd) yes Good home lighting?: (Patient-Rptd) yes Regular seat belt use?: (Patient-Rptd) yes Hospital stays in the last year:: (Patient-Rptd) no  Cognitive Assessment Difficulty concentrating, remembering, or making decisions? : (Patient-Rptd) yes Will 6CIT or Mini Cog be Completed: yes What year is it?: 0 points What month is it?: 0 points Give patient an address phrase to remember (5 components): [id number] [place] what time is it?: 0 points Count backwards from 20 to 1: 0 points Say the months of the year in reverse: 0 points Repeat the address phrase from earlier: 2 points 6 CIT Score: 2 points  Advance Directives (For Healthcare) Does Patient Have a Medical Advance Directive?: No Would patient like information on creating a medical advance directive?: Yes (MAU/Ambulatory/Procedural Areas - Information given)  Reviewed/Updated  Reviewed/Updated: Reviewed All (Medical, Surgical, Family, Medications, Allergies, Care Teams, Patient Goals)    Allergies (verified) Lipitor [atorvastatin ] and Naproxen    Current Medications (verified) Outpatient Encounter Medications as of 07/01/2024  Medication Sig   albuterol  (PROVENTIL ) (2.5  MG/3ML) 0.083% nebulizer solution Take 3 mLs (2.5 mg total) by nebulization every 6 (six) hours as needed for wheezing or shortness of breath.   albuterol  (VENTOLIN  HFA) 108 (90 Base) MCG/ACT inhaler Inhale 2 puffs  into the lungs every 6 (six) hours as needed for wheezing or shortness of breath.   amLODipine  (NORVASC ) 10 MG tablet Take 1 tablet (10 mg total) by mouth daily.   Budeson-Glycopyrrol-Formoterol  (BREZTRI  AEROSPHERE) 160-9-4.8 MCG/ACT AERO USE 2 INHALATIONS BY MOUTH TWICE DAILY   buPROPion  (WELLBUTRIN  XL) 150 MG 24 hr tablet Take 1 tablet (150 mg total) by mouth daily.   busPIRone  (BUSPAR ) 15 MG tablet TAKE 1 TABLET BY MOUTH TWICE  DAILY FOR ANXIETY   Calcium  Carb-Cholecalciferol  (CALCIUM  600+D3) 600-20 MG-MCG TABS Take 1 tablet by mouth daily.   cariprazine  (VRAYLAR ) 1.5 MG capsule Take 1 capsule (1.5 mg total) by mouth daily.   carvedilol  (COREG ) 6.25 MG tablet TAKE 1 TABLET BY MOUTH TWICE  DAILY WITH A MEAL   Cholecalciferol  (VITAMIN D3) 50 MCG (2000 UT) capsule Take 2,000 Units by mouth daily.   citalopram  (CELEXA ) 40 MG tablet Take 1 tablet (40 mg total) by mouth daily.   clobetasol  (TEMOVATE ) 0.05 % external solution Apply 1 Application topically 2 (two) times daily as needed (scalp rash).   fexofenadine (ALLEGRA) 180 MG tablet Take 180 mg by mouth daily.   fluticasone  (FLONASE ) 50 MCG/ACT nasal spray SHAKE LIQUID AND USE 2 SPRAYS IN EACH NOSTRIL DAILY   losartan  (COZAAR ) 100 MG tablet TAKE 1 TABLET BY MOUTH DAILY   Misc Natural Products (COLON CLEANSE PO) Take 1 capsule by mouth at bedtime.   pantoprazole  (PROTONIX ) 20 MG tablet TAKE 1 TABLET BY MOUTH DAILY FOR ACID REFLUX   Polyethyl Glyc-Propyl Glyc PF (SYSTANE HYDRATION PF) 0.4-0.3 % SOLN Place 1 drop into both eyes daily as needed (Dry eye).   pregabalin  (LYRICA ) 150 MG capsule Take 1 capsule (150 mg total) by mouth in the morning AND 2 capsules (300 mg total) every evening.   rosuvastatin  (CRESTOR ) 5 MG tablet TAKE 1 TABLET  BY MOUTH AT  BEDTIME   No facility-administered encounter medications on file as of 07/01/2024.    History: Past Medical History:  Diagnosis Date   Allergy    Anxiety    Arthritis    Binge eating disorder    Carpal tunnel syndrome on right    Cataract    Both lenses been replaced   Cervical stenosis of spine    C5-6   COPD (chronic obstructive pulmonary disease) (HCC)    Gold III   Depression    Emphysema of lung (HCC)    Emphysema, unspecified (HCC)    Encounter for screening fecal occult blood testing 11/29/2022   Fibromuscular dysplasia of renal artery 2006   GERD (gastroesophageal reflux disease)    Headache    migraines 20 years ago   Hepatitis    type A in 1977   HNP (herniated nucleus pulposus), cervical    HPV (human papilloma virus) infection    HTN (hypertension)    Neuropathy    Sleep apnea    Vaginal Pap smear, abnormal    Vitamin D  deficiency    Past Surgical History:  Procedure Laterality Date   ANTERIOR CERVICAL DECOMP/DISCECTOMY FUSION N/A 10/05/2017   Procedure: C5-6 ANTERIOR CERVICAL DECOMPRESSION/DISCECTOMY FUSION,  ALLOGRAFT, PLATE  (NECK FIRST AND CARPAL TUNNEL RELEASE SECOND);  Surgeon: Barbarann Oneil BROCKS, MD;  Location: [facility] OR;  Service: Orthopedics;  Laterality: N/A;   APPENDECTOMY  1993   BREAST BIOPSY Right    CARPAL TUNNEL RELEASE Right 10/05/2017   Procedure: RIGHT CARPAL TUNNEL RELEASE;  Surgeon: Barbarann Oneil BROCKS, MD;  Location: [facility] OR;  Service: Orthopedics;  Laterality:  Right;   CATARACT EXTRACTION  08/2010   left    CATARACT EXTRACTION W/PHACO  06/01/2011   Procedure: CATARACT EXTRACTION PHACO AND INTRAOCULAR LENS PLACEMENT (IOC);  Surgeon: Cherene Mania;  Location: [facility] ORS;  Service: Ophthalmology;  Laterality: Right;  CDE:9.62   CHOLECYSTECTOMY     COLONOSCOPY WITH PROPOFOL  N/A 07/06/2020   Procedure: COLONOSCOPY WITH PROPOFOL ;  Surgeon: Cindie Carlin POUR, DO;  Location: [facility] ENDOSCOPY;  Service: Endoscopy;  Laterality: N/A;  11:00am   ESOPHAGEAL  DILATION N/A 11/05/2023   Procedure: DILATION, ESOPHAGUS;  Surgeon: Cindie Carlin POUR, DO;  Location: [facility] ENDO SUITE;  Service: Endoscopy;  Laterality: N/A;   ESOPHAGOGASTRODUODENOSCOPY N/A 11/05/2023   Procedure: EGD (ESOPHAGOGASTRODUODENOSCOPY);  Surgeon: Cindie Carlin POUR, DO;  Location: [facility] ENDO SUITE;  Service: Endoscopy;  Laterality: N/A;  8:00 am, asa 3   EYE SURGERY     Cataract removal   HYSTEROSCOPY WITH D & C N/A 03/12/2024   Procedure: HYSTEROSCOPY;  Surgeon: Marilynn Nest, DO;  Location: [facility] ORS;  Service: Gynecology;  Laterality: N/A;   LEEP N/A 03/12/2024   Procedure: LEEP (LOOP ELECTROSURGICAL EXCISION PROCEDURE);  Surgeon: Ozan, Jennifer, DO;  Location: [facility] ORS;  Service: Gynecology;  Laterality: N/A;   RENAL ARTERY ANGIOPLASTY  2006   right   TUBAL LIGATION  1984   [facility]   UPPER GASTROINTESTINAL ENDOSCOPY  10/2023   Family History  Problem Relation Age of Onset   Cancer Mother        breast   Anxiety disorder Mother    Arthritis Mother    Depression Mother    Obesity Mother    Coronary artery disease Father        States congenital abnormality   Diabetes Father    Hyperlipidemia Father    Early death Father    Heart disease Father    CAD Sister        Stents    Cancer Sister        breast   Anxiety disorder Sister    Asthma Sister    Depression Sister    Obesity Sister    Kidney disease Sister    ADD / ADHD Sister    Cancer Maternal Grandmother        breast   Heart disease Maternal Grandfather    Cancer Maternal Aunt        breast   Anesthesia problems Neg Hx    Hypotension Neg Hx    Malignant hyperthermia Neg Hx    Pseudochol deficiency Neg Hx    Colon cancer Neg Hx    Liver disease Neg Hx    Social History   Occupational History   Not on file  Tobacco Use   Smoking status: Every Day    Current packs/day: 0.00    Average packs/day: 1 pack/day for 40.0 years (40.0 ttl pk-yrs)    Types: Cigarettes    Start date: 11/01/1981    Last attempt to  quit: 11/01/2021    Years since quitting: 2.6    Passive exposure: Current   Smokeless tobacco: Never   Tobacco comments:    Quit 11/15/21  Vaping Use   Vaping status: Former   Devices: hit a vape 3x a couple weeks ago with friends.  Substance and Sexual Activity   Alcohol use: Not Currently   Drug use: Yes    Frequency: 7.0 times per week    Types: Marijuana   Sexual activity: Not Currently    Birth control/protection: Abstinence, Post-menopausal  Comment: tubal   Tobacco Counseling Ready to quit: Yes Counseling given: Not Answered Tobacco comments: Quit 11/15/21  SDOH Screenings   Food Insecurity: Food Insecurity Present (07/01/2024)  Housing: Low Risk (07/01/2024)  Transportation Needs: No Transportation Needs (07/01/2024)  Utilities: Not At Risk (07/01/2024)  Alcohol Screen: Low Risk (06/09/2024)  Depression (PHQ2-9): High Risk (07/01/2024)  Financial Resource Strain: Medium Risk (06/09/2024)  Physical Activity: Inactive (07/01/2024)  Social Connections: Socially Isolated (07/01/2024)  Stress: Stress Concern Present (07/01/2024)  Tobacco Use: High Risk (07/01/2024)  Health Literacy: Adequate Health Literacy (07/01/2024)   See flowsheets for full screening details  Depression Screen PHQ 2 & 9 Depression Scale- Over the past 2 weeks, how often have you been bothered by any of the following problems? Little interest or pleasure in doing things: 2 Feeling down, depressed, or hopeless (PHQ Adolescent also includes...irritable): 2 PHQ-2 Total Score: 4 Trouble falling or staying asleep, or sleeping too much: 2 Feeling tired or having little energy: 3 Poor appetite or overeating (PHQ Adolescent also includes...weight loss): 3 Feeling bad about yourself - or that you are a failure or have let yourself or your family down: 2 Trouble concentrating on things, such as reading the newspaper or watching television (PHQ Adolescent also includes...like school work): 3 Moving or speaking so  slowly that other people could have noticed. Or the opposite - being so fidgety or restless that you have been moving around a lot more than usual: 1 Thoughts that you would be better off dead, or of hurting yourself in some way: 0 PHQ-9 Total Score: 18 If you checked off any problems, how difficult have these problems made it for you to do your work, take care of things at home, or get along with other people?: Very difficult  Depression Treatment Depression Interventions/Treatment : Currently on Treatment; Medication     Goals Addressed             This Visit's Progress    Maintain health and independence   On track            Objective:    Today's Vitals   07/01/24 1409  Weight: 191 lb (86.6 kg)  Height: 5' 4 (1.626 m)   Body mass index is 32.79 kg/m.  Hearing/Vision screen No results found. Immunizations and Health Maintenance Health Maintenance  Topic Date Due   COVID-19 Vaccine (2 - Moderna risk series) 11/03/2019   Lung Cancer Screening  07/30/2024   Hepatitis B Vaccines 19-59 Average Risk (1 of 3 - Risk 3-dose series) 06/10/2025 (Originally 03/03/2020)   Mammogram  04/07/2025   Medicare Annual Wellness (AWV)  07/01/2025   Bone Density Scan  02/21/2026   DTaP/Tdap/Td (2 - Td or Tdap) 01/05/2028   Cervical Cancer Screening (HPV/Pap Cotest)  12/02/2028   Colonoscopy  07/06/2030   Pneumococcal Vaccine: 50+ Years  Completed   Influenza Vaccine  Completed   HPV VACCINES (No Doses Required) Completed   Hepatitis C Screening  Completed   HIV Screening  Completed   Zoster Vaccines- Shingrix  Completed   Meningococcal B Vaccine  Aged Out        Assessment/Plan:  This is a routine wellness examination for Jerriyah.  Patient Care Team: Jolinda Norene HERO, DO as PCP - General (Family Medicine) Alvan Dorn FALCON, MD as PCP - Cardiology (Cardiology) Cindie Carlin POUR, DO as Consulting Physician (Internal Medicine) Onesimo Oneil LABOR, MD as Consulting Physician  (Orthopedic Surgery) Dr [facility], OD (Optometry) Edmond, Comer GAILS,  NP as Nurse Practitioner (Nurse Practitioner) Signa Delon LABOR, NP as Nurse Practitioner (Obstetrics and Gynecology) Parrett, Madelin RAMAN, NP as Nurse Practitioner (Pulmonary Disease) Darlean Ozell NOVAK, MD as Consulting Physician (Pulmonary Disease)  I have personally reviewed and noted the following in the patients chart:   Medical and social history Use of alcohol, tobacco or illicit drugs  Current medications and supplements including opioid prescriptions. Functional ability and status Nutritional status Physical activity Advanced directives List of other physicians Hospitalizations, surgeries, and ER visits in previous 12 months Vitals Screenings to include cognitive, depression, and falls Referrals and appointments  No orders of the defined types were placed in this encounter.  In addition, I have reviewed and discussed with patient certain preventive protocols, quality metrics, and best practice recommendations. A written personalized care plan for preventive services as well as general preventive health recommendations were provided to patient.   Lavelle Charmaine Browner, LPN   8/72/7973   Return in 1 year (on 07/01/2025).  After Visit Summary: (MyChart) Due to this being a telephonic visit, the after visit summary with patients personalized plan was offered to patient via MyChart   Nurse Notes: No voiced or noted concerns at this time  "

## 2024-07-07 ENCOUNTER — Ambulatory Visit: Admitting: Gastroenterology

## 2024-07-29 ENCOUNTER — Ambulatory Visit (HOSPITAL_COMMUNITY)

## 2024-07-29 ENCOUNTER — Encounter (HOSPITAL_COMMUNITY)

## 2024-08-04 ENCOUNTER — Ambulatory Visit: Admitting: Cardiology

## 2024-08-05 ENCOUNTER — Ambulatory Visit: Admitting: Internal Medicine

## 2024-08-18 ENCOUNTER — Ambulatory Visit: Admitting: Internal Medicine

## 2024-09-01 ENCOUNTER — Ambulatory Visit: Admitting: Gastroenterology
